# Patient Record
Sex: Female | Born: 1950
Health system: Southern US, Community
[De-identification: ages and names within clinical notes are randomized; demographics above are authoritative.]

## PROBLEM LIST (undated history)

## (undated) DIAGNOSIS — M1990 Unspecified osteoarthritis, unspecified site: Secondary | ICD-10-CM

## (undated) DIAGNOSIS — I351 Nonrheumatic aortic (valve) insufficiency: Secondary | ICD-10-CM

## (undated) DIAGNOSIS — G4730 Sleep apnea, unspecified: Secondary | ICD-10-CM

## (undated) DIAGNOSIS — R918 Other nonspecific abnormal finding of lung field: Secondary | ICD-10-CM

## (undated) DIAGNOSIS — K649 Unspecified hemorrhoids: Secondary | ICD-10-CM

## (undated) DIAGNOSIS — I340 Nonrheumatic mitral (valve) insufficiency: Secondary | ICD-10-CM

## (undated) DIAGNOSIS — Z9889 Other specified postprocedural states: Secondary | ICD-10-CM

## (undated) DIAGNOSIS — Z8489 Family history of other specified conditions: Secondary | ICD-10-CM

## (undated) DIAGNOSIS — E039 Hypothyroidism, unspecified: Secondary | ICD-10-CM

## (undated) DIAGNOSIS — E785 Hyperlipidemia, unspecified: Secondary | ICD-10-CM

## (undated) DIAGNOSIS — K219 Gastro-esophageal reflux disease without esophagitis: Secondary | ICD-10-CM

## (undated) DIAGNOSIS — T8859XA Other complications of anesthesia, initial encounter: Secondary | ICD-10-CM

## (undated) DIAGNOSIS — R112 Nausea with vomiting, unspecified: Secondary | ICD-10-CM

## (undated) DIAGNOSIS — Z8719 Personal history of other diseases of the digestive system: Secondary | ICD-10-CM

## (undated) DIAGNOSIS — I10 Essential (primary) hypertension: Secondary | ICD-10-CM

## (undated) DIAGNOSIS — I519 Heart disease, unspecified: Secondary | ICD-10-CM

## (undated) DIAGNOSIS — I714 Abdominal aortic aneurysm, without rupture: Secondary | ICD-10-CM

## (undated) DIAGNOSIS — T4145XA Adverse effect of unspecified anesthetic, initial encounter: Secondary | ICD-10-CM

## (undated) DIAGNOSIS — R51 Headache: Secondary | ICD-10-CM

## (undated) HISTORY — DX: Hyperlipidemia, unspecified: E78.5

## (undated) HISTORY — PX: VAGINAL HYSTERECTOMY: SHX2639

## (undated) HISTORY — DX: Sleep apnea, unspecified: G47.30

## (undated) HISTORY — DX: Essential (primary) hypertension: I10

---
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
## 1973-03-11 HISTORY — PX: CYSTECTOMY: SUR359

## 1978-03-11 HISTORY — PX: ANKLE SURGERY: SHX546

## 1981-03-11 HISTORY — PX: OTHER SURGICAL HISTORY: SHX169

## 1997-12-06 ENCOUNTER — Other Ambulatory Visit: Admission: RE | Admit: 1997-12-06 | Discharge: 1997-12-06 | Payer: Self-pay | Admitting: Obstetrics & Gynecology

## 1999-01-02 ENCOUNTER — Other Ambulatory Visit: Admission: RE | Admit: 1999-01-02 | Discharge: 1999-01-02 | Payer: Self-pay | Admitting: Obstetrics & Gynecology

## 1999-12-25 ENCOUNTER — Other Ambulatory Visit: Admission: RE | Admit: 1999-12-25 | Discharge: 1999-12-25 | Payer: Self-pay | Admitting: Obstetrics & Gynecology

## 1999-12-28 ENCOUNTER — Encounter: Payer: Self-pay | Admitting: Obstetrics & Gynecology

## 1999-12-28 ENCOUNTER — Ambulatory Visit (HOSPITAL_COMMUNITY): Admission: RE | Admit: 1999-12-28 | Discharge: 1999-12-28 | Payer: Self-pay | Admitting: Obstetrics & Gynecology

## 2001-01-07 ENCOUNTER — Other Ambulatory Visit: Admission: RE | Admit: 2001-01-07 | Discharge: 2001-01-07 | Payer: Self-pay | Admitting: Obstetrics & Gynecology

## 2001-12-22 ENCOUNTER — Ambulatory Visit (HOSPITAL_COMMUNITY): Admission: RE | Admit: 2001-12-22 | Discharge: 2001-12-22 | Payer: Self-pay | Admitting: Obstetrics & Gynecology

## 2001-12-22 ENCOUNTER — Encounter: Payer: Self-pay | Admitting: Obstetrics & Gynecology

## 2002-01-08 ENCOUNTER — Other Ambulatory Visit: Admission: RE | Admit: 2002-01-08 | Discharge: 2002-01-08 | Payer: Self-pay | Admitting: Obstetrics & Gynecology

## 2002-12-24 ENCOUNTER — Ambulatory Visit (HOSPITAL_COMMUNITY): Admission: RE | Admit: 2002-12-24 | Discharge: 2002-12-24 | Payer: Self-pay | Admitting: Obstetrics & Gynecology

## 2002-12-24 ENCOUNTER — Encounter: Payer: Self-pay | Admitting: Obstetrics & Gynecology

## 2003-01-12 ENCOUNTER — Other Ambulatory Visit: Admission: RE | Admit: 2003-01-12 | Discharge: 2003-01-12 | Payer: Self-pay | Admitting: Obstetrics & Gynecology

## 2003-03-12 HISTORY — PX: HEMORRHOID SURGERY: SHX153

## 2003-09-29 ENCOUNTER — Ambulatory Visit (HOSPITAL_COMMUNITY): Admission: RE | Admit: 2003-09-29 | Discharge: 2003-09-29 | Payer: Self-pay | Admitting: Gastroenterology

## 2003-10-28 ENCOUNTER — Encounter (INDEPENDENT_AMBULATORY_CARE_PROVIDER_SITE_OTHER): Payer: Self-pay | Admitting: *Deleted

## 2003-10-28 ENCOUNTER — Ambulatory Visit (HOSPITAL_COMMUNITY): Admission: RE | Admit: 2003-10-28 | Discharge: 2003-10-28 | Payer: Self-pay | Admitting: *Deleted

## 2004-01-10 ENCOUNTER — Ambulatory Visit (HOSPITAL_COMMUNITY): Admission: RE | Admit: 2004-01-10 | Discharge: 2004-01-10 | Payer: Self-pay | Admitting: Obstetrics & Gynecology

## 2004-01-26 ENCOUNTER — Other Ambulatory Visit: Admission: RE | Admit: 2004-01-26 | Discharge: 2004-01-26 | Payer: Self-pay | Admitting: Obstetrics & Gynecology

## 2004-11-21 ENCOUNTER — Emergency Department (HOSPITAL_COMMUNITY): Admission: EM | Admit: 2004-11-21 | Discharge: 2004-11-21 | Payer: Self-pay | Admitting: Emergency Medicine

## 2005-01-17 ENCOUNTER — Ambulatory Visit (HOSPITAL_COMMUNITY): Admission: RE | Admit: 2005-01-17 | Discharge: 2005-01-17 | Payer: Self-pay | Admitting: Obstetrics & Gynecology

## 2005-02-04 ENCOUNTER — Other Ambulatory Visit: Admission: RE | Admit: 2005-02-04 | Discharge: 2005-02-04 | Payer: Self-pay | Admitting: Obstetrics & Gynecology

## 2005-11-13 ENCOUNTER — Encounter: Admission: RE | Admit: 2005-11-13 | Discharge: 2005-11-13 | Payer: Self-pay | Admitting: Internal Medicine

## 2006-02-05 ENCOUNTER — Ambulatory Visit (HOSPITAL_COMMUNITY): Admission: RE | Admit: 2006-02-05 | Discharge: 2006-02-05 | Payer: Self-pay | Admitting: Obstetrics & Gynecology

## 2006-03-28 ENCOUNTER — Emergency Department (HOSPITAL_COMMUNITY): Admission: EM | Admit: 2006-03-28 | Discharge: 2006-03-28 | Payer: Self-pay | Admitting: Emergency Medicine

## 2006-04-03 ENCOUNTER — Encounter (HOSPITAL_COMMUNITY): Admission: RE | Admit: 2006-04-03 | Discharge: 2006-05-03 | Payer: Self-pay | Admitting: Neurology

## 2006-05-05 ENCOUNTER — Encounter (HOSPITAL_COMMUNITY): Admission: RE | Admit: 2006-05-05 | Discharge: 2006-06-04 | Payer: Self-pay | Admitting: Neurology

## 2007-03-26 ENCOUNTER — Ambulatory Visit (HOSPITAL_COMMUNITY): Admission: RE | Admit: 2007-03-26 | Discharge: 2007-03-26 | Payer: Self-pay | Admitting: Obstetrics & Gynecology

## 2008-05-23 ENCOUNTER — Ambulatory Visit (HOSPITAL_COMMUNITY): Admission: RE | Admit: 2008-05-23 | Discharge: 2008-05-23 | Payer: Self-pay | Admitting: Obstetrics & Gynecology

## 2009-08-29 ENCOUNTER — Ambulatory Visit (HOSPITAL_COMMUNITY): Admission: RE | Admit: 2009-08-29 | Discharge: 2009-08-29 | Payer: Self-pay | Admitting: Obstetrics & Gynecology

## 2009-10-26 ENCOUNTER — Ambulatory Visit: Payer: Self-pay | Admitting: General Practice

## 2010-07-27 NOTE — Op Note (Signed)
NAME:  Jenny Bradford, Jenny Bradford                          ACCOUNT NO.:  [id number]   MEDICAL RECORD NO.:  [id number]                   PATIENT TYPE:  AMB   LOCATION:  DAY                                  FACILITY:  [facility]   PHYSICIAN:  Vikki Ports, M.D.         DATE OF BIRTH:  January 30, 1951   DATE OF PROCEDURE:  10/28/2003  DATE OF DISCHARGE:                                 OPERATIVE REPORT   PREOPERATIVE DIAGNOSIS:  Grade III internal hemorrhoids.   POSTOPERATIVE DIAGNOSIS:  Grade III internal hemorrhoids.   PROCEDURE:  Procedure for prolapse and hemorrhoid rectopexy.   SURGEON:  Vikki Ports, M.D.   ANESTHESIA:  General.   DESCRIPTION OF PROCEDURE:  Patient was taken to the operating room and  placed in a supine position.  After adequate anesthesia was induced using an  endotracheal tube, the patient was placed in a prone jack-knife position.  Perianal and rectal prep were undertaken.  Anal dilatation was accomplished  with three fingers.  Hemorrhoidal bundles were injected with Marcaine and  Wydase, and internal and external sphincters were injected with 20 cc of  0.5% Marcaine.  A running 2-0 Prolene purse-string suture was placed 5 cm  proximal to the dentate line.  A PPH taper was then inserted through the  anus and up into the rectum.  The purse-string suture was tied down.  It was  closed and fired after verifying no involvement of the vaginal wall.  It was  removed.  The staple line was inspected.  There was no bleeding.  Gelfoam  packing was placed.  Patient tolerated the procedure well and went to the  PACU in good condition.                                               Vikki Ports, M.D.    KRH/MEDQ  D:  10/28/2003  T:  10/28/2003  Job:  [id number]

## 2010-07-27 NOTE — Op Note (Signed)
NAME:  MALILLANY, KAZLAUSKAS NO.:  [id number]   MEDICAL RECORD NO.:  [id number]                   PATIENT TYPE:  AMB   LOCATION:  ENDO                                 FACILITY:  [facility]   PHYSICIAN:  Danise Edge, M.D.                DATE OF BIRTH:  [birth year]   DATE OF PROCEDURE:  09/29/2003  DATE OF DISCHARGE:                                 OPERATIVE REPORT   PROCEDURE:  Colonoscopy.   PROCEDURE INDICATION:  Ms. Esperansa Sarabia is a 61 year old female, born  06-07-1950.  Ms. Brunelli has intermittent painless hematochezia and is  scheduled to undergo a diagnostic colonoscopy with polypectomy to prevent  colon cancer.   ENDOSCOPIST:  Danise Edge, M.D.   PREMEDICATION:  1. Versed 10 mg.  2. Demerol 70 mg.   DESCRIPTION OF PROCEDURE:  After obtaining informed consent, Ms. Penafiel was  placed in the left lateral decubitus position.  I administered intravenous  Demerol and intravenous Versed to achieve conscious sedation for the  procedure.  The patient's blood pressure, oxygen saturation, and cardiac  rhythm were monitored throughout the procedure and documented in the medical  record.   Anal inspection was normal.  Digital rectal exam was normal.  The Olympus  adjustable pediatric colonoscope was introduced into the rectum and advanced  to the cecum.  A normal-appearing ileocecal valve was intubated and the  distal ileum inspected.  Colonic preparation for the exam today was  satisfactory after using Visicol colonic lavage tablets.   RECTUM:  Normal.  Retroflexed view of the distal rectum was normal.  I do  not identify large internal hemorrhoids.  There is an anal papilla visible  in the distal rectum by retroflexed view.  SIGMOID COLON AND DESCENDING COLON:  A few small, nonbleeding diverticula  are present in the left colon.  SPLENIC FLEXURE:  Normal.  TRANSVERSE COLON:  Normal.  HEPATIC FLEXURE:  Normal.  ASCENDING COLON:  Normal.  CECUM  AND ILEOCECAL VALVE:  Normal.  DISTAL ILEUM:  Normal.   ASSESSMENT:  Normal proctocolonoscopy to the cecum with inspection of the  distal ileum.  I do not identify large internal hemorrhoids.  There are a  few small diverticula in the left colon.                                               Danise Edge, M.D.    MJ/MEDQ  D:  09/29/2003  T:  09/29/2003  Job:  [id number]   cc:   Royetta Crochet  [address]  [place], [place] [address]   Vikki Ports, M.D.  [address]  [place]  [place] [address]  Fax: [phone]   Ike Bene, M.D.  [address]  [place]  [place] [address]  Fax: [phone]

## 2010-10-16 ENCOUNTER — Other Ambulatory Visit (HOSPITAL_COMMUNITY): Payer: Self-pay | Admitting: Obstetrics & Gynecology

## 2010-10-16 DIAGNOSIS — Z1231 Encounter for screening mammogram for malignant neoplasm of breast: Secondary | ICD-10-CM

## 2010-10-30 ENCOUNTER — Ambulatory Visit (HOSPITAL_COMMUNITY)
Admission: RE | Admit: 2010-10-30 | Discharge: 2010-10-30 | Disposition: A | Discharge status: Home / Self Care | Payer: PRIVATE HEALTH INSURANCE | Source: Ambulatory Visit | Attending: Obstetrics & Gynecology | Admitting: Obstetrics & Gynecology

## 2010-10-30 DIAGNOSIS — Z1231 Encounter for screening mammogram for malignant neoplasm of breast: Secondary | ICD-10-CM | POA: Insufficient documentation

## 2011-04-01 DIAGNOSIS — I10 Essential (primary) hypertension: Secondary | ICD-10-CM

## 2011-04-01 HISTORY — DX: Essential (primary) hypertension: I10

## 2011-10-28 ENCOUNTER — Other Ambulatory Visit: Payer: Self-pay | Admitting: Obstetrics & Gynecology

## 2011-12-10 HISTORY — PX: FOOT SURGERY: SHX648

## 2012-01-23 ENCOUNTER — Other Ambulatory Visit (HOSPITAL_COMMUNITY): Payer: Self-pay | Admitting: Obstetrics & Gynecology

## 2012-01-23 DIAGNOSIS — Z1231 Encounter for screening mammogram for malignant neoplasm of breast: Secondary | ICD-10-CM

## 2012-02-10 ENCOUNTER — Ambulatory Visit (HOSPITAL_COMMUNITY)
Admission: RE | Admit: 2012-02-10 | Discharge: 2012-02-10 | Disposition: A | Discharge status: Home / Self Care | Payer: PRIVATE HEALTH INSURANCE | Source: Ambulatory Visit | Attending: Obstetrics & Gynecology | Admitting: Obstetrics & Gynecology

## 2012-02-10 DIAGNOSIS — Z1231 Encounter for screening mammogram for malignant neoplasm of breast: Secondary | ICD-10-CM | POA: Insufficient documentation

## 2012-04-16 ENCOUNTER — Other Ambulatory Visit (HOSPITAL_COMMUNITY): Payer: Self-pay | Admitting: Cardiovascular Disease

## 2012-04-16 DIAGNOSIS — I119 Hypertensive heart disease without heart failure: Secondary | ICD-10-CM

## 2012-05-01 ENCOUNTER — Ambulatory Visit (HOSPITAL_COMMUNITY)
Admission: RE | Admit: 2012-05-01 | Discharge: 2012-05-01 | Disposition: A | Discharge status: Home / Self Care | Payer: 59 | Source: Ambulatory Visit | Attending: Cardiovascular Disease | Admitting: Cardiovascular Disease

## 2012-05-01 DIAGNOSIS — I119 Hypertensive heart disease without heart failure: Secondary | ICD-10-CM

## 2012-05-01 DIAGNOSIS — R002 Palpitations: Secondary | ICD-10-CM | POA: Insufficient documentation

## 2012-05-01 DIAGNOSIS — R0609 Other forms of dyspnea: Secondary | ICD-10-CM | POA: Insufficient documentation

## 2012-05-01 DIAGNOSIS — I10 Essential (primary) hypertension: Secondary | ICD-10-CM | POA: Insufficient documentation

## 2012-05-01 DIAGNOSIS — R0989 Other specified symptoms and signs involving the circulatory and respiratory systems: Secondary | ICD-10-CM | POA: Insufficient documentation

## 2012-05-01 DIAGNOSIS — R079 Chest pain, unspecified: Secondary | ICD-10-CM | POA: Insufficient documentation

## 2012-05-01 MED ORDER — TECHNETIUM TC 99M SESTAMIBI GENERIC - CARDIOLITE
30.0000 | Freq: Once | INTRAVENOUS | Status: AC | PRN
  Administered 2012-05-01: 30 via INTRAVENOUS

## 2012-05-01 MED ORDER — TECHNETIUM TC 99M SESTAMIBI GENERIC - CARDIOLITE
10.0000 | Freq: Once | INTRAVENOUS | Status: AC | PRN
  Administered 2012-05-01: 10 via INTRAVENOUS

## 2012-05-01 MED ORDER — REGADENOSON 0.4 MG/5ML IV SOLN
0.4000 mg | Freq: Once | INTRAVENOUS | Status: AC
  Administered 2012-05-01: 0.4 mg via INTRAVENOUS

## 2012-05-01 NOTE — Procedures (Addendum)
[facility] [address] [place] [address] [place] [address] [phone]  Cardiology Nuclear Med Study  Jenny Bradford is a 62 y.o. female     MRN : [id number]     DOB: November 05, 1950  Procedure Date: 05/01/2012  Nuclear Med Background Indication for Stress Test:  Evaluation for Ischemia History:  NO PRIOR CARDIAC HISTORY  Cardiac Risk Factors: Family History - CAD, Hypertension and Lipids  Symptoms:  Chest Pain, DOE, Fatigue, Palpitations and SOB   Nuclear Pre-Procedure Caffeine/Decaff Intake:  1:30am NPO After: 11:30 AM   IV Site: R Forearm  IV 0.9% NS with Angio Cath:  22g  Chest Size (in):  N/A IV Started by: Emmit Pomfret, RN  Height: 5\' 3"  (1.6 m)  Cup Size: C  BMI:  Body mass index is 28.35 kg/(m^2). Weight:  160 lb (72.576 kg)   Tech Comments:  N/A    Nuclear Med Study 1 or 2 day study: 1 day  Stress Test Type:  Lexiscan  Order Authorizing Provider:  Nicki Guadalajara, MD   Resting Radionuclide: Technetium 11m Sestamibi  Resting Radionuclide Dose: 10.6 mCi   Stress Radionuclide:  Technetium 76m Sestamibi  Stress Radionuclide Dose: 30.7 mCi           Stress Protocol Rest HR: 60 Stress HR: 84  Rest BP:145/89 Stress BP: 125/79  Exercise Time (min): n/a METS: n/a          Dose of Adenosine (mg):  n/a Dose of Lexiscan: 0.4 mg  Dose of Atropine (mg): n/a Dose of Dobutamine: n/a mcg/kg/min (at max HR)  Stress Test Technologist: Ernestene Mention, CCT Nuclear Technologist: Koren Shiver, CNMT   Rest Procedure:  Myocardial perfusion imaging was performed at rest 45 minutes following the intravenous administration of Technetium 7m Sestamibi. Stress Procedure:  The patient received IV Lexiscan 0.4 mg over 15-seconds.  Technetium 57m Sestamibi injected at 30-seconds.  There were no significant changes with Lexiscan.  Quantitative spect images were obtained after a 45 minute delay.  Transient Ischemic Dilatation (Normal <1.22):   0.62 Lung/Heart Ratio (Normal <0.45):  0.34 QGS EDV:  50  ml QGS ESV:  11 ml LV Ejection Fraction: 78%     Rest ECG: NSR - Normal EKG and RSR'  Stress ECG: No significant change from baseline ECG  QPS  Raw Data Images: There is no interference from nuclear activity from structures below the diaphragm. Mild breast attenuation. Small left ventricular size.  Stress Images: There is mild apical thinning with normal uptake in other regions.  Rest Images: Comparison with the stress images reveals no significant change.  Subtraction (SDS): No reversibility is appreciated. There is no evidence of scar or ischemia.  Impression  Exercise Capacity: Lexiscan with no exercise.  BP Response: Normal blood pressure response.  Clinical Symptoms: No significant symptoms noted.  ECG Impression: No significant ST segment change with adenosine.  Comparison with Prior Nuclear Study: No significant change from previous study  Overall Impression: Normal stress nuclear study. Low risk stress nuclear study.  LV Wall Motion: NL LV Function; NL Wall Motion  Thurmon Fair, MD, [facility] [phone] office [phone] pager

## 2012-05-03 NOTE — Procedures (Signed)
   Preliminary Report:    Rest ECG: NSR - Normal EKG and RSR'  Stress ECG: No significant change from baseline ECG  QPS Raw Data Images:  There is no interference from nuclear activity from structures below the diaphragm.    Mild breast attenuation.  Small left ventricular size. Stress Images:  There is mild apical thinning with normal uptake in other regions. Rest Images:  Comparison with the stress images reveals no significant change. Subtraction (SDS):  No reversibility is appreciated.  There is no evidence of scar or ischemia.  Impression Exercise Capacity:  Lexiscan with no exercise. BP Response:  Normal blood pressure response. Clinical Symptoms:  No significant symptoms noted. ECG Impression:  No significant ST segment change with adenosine. Comparison with Prior Nuclear Study: No significant change from previous study  Overall Impression:  Normal stress nuclear study. Low risk stress nuclear study.  LV Wall Motion:  NL LV Function; NL Wall Motion   Jenny Bradford W, MD  05/03/2012 7:37 PM

## 2012-05-03 NOTE — CV Procedure (Addendum)
Preliminary Report: Lexiscan Myoview Rest ECG: NSR - Normal EKG and RSR'  Stress ECG: No significant change from baseline ECG  QPS   Raw Data Images: There is no interference from nuclear activity from structures below the diaphragm. Mild breast attenuation. Small left ventricular size.  Stress Images: There is mild apical thinning with normal uptake in other regions.  Rest Images: Comparison with the stress images reveals no significant change.  Subtraction (SDS): No reversibility is appreciated. There is no evidence of scar or ischemia.  Impression   Exercise Capacity: Lexiscan with no exercise.  BP Response: Normal blood pressure response.  Clinical Symptoms: No significant symptoms noted.  ECG Impression: No significant ST segment change with adenosine.  LV Wall Motion: NL LV Function; NL Wall Motion  Comparison with Prior Nuclear Study: No significant change from previous study   Overall Impression: Normal stress nuclear study. Low risk stress nuclear study.    Marykay Lex, MD  05/03/2012 7:37 PM

## 2012-08-06 ENCOUNTER — Ambulatory Visit: Payer: PRIVATE HEALTH INSURANCE | Admitting: Cardiovascular Disease

## 2012-08-06 ENCOUNTER — Encounter: Payer: Self-pay | Admitting: *Deleted

## 2012-08-18 ENCOUNTER — Encounter: Payer: Self-pay | Admitting: Cardiovascular Disease

## 2012-10-05 ENCOUNTER — Ambulatory Visit (INDEPENDENT_AMBULATORY_CARE_PROVIDER_SITE_OTHER): Payer: 59 | Admitting: Cardiovascular Disease

## 2012-10-05 ENCOUNTER — Encounter: Payer: Self-pay | Admitting: Cardiovascular Disease

## 2012-10-05 VITALS — BP 140/88 | HR 78 | Ht 63.0 in | Wt 159.0 lb

## 2012-10-05 DIAGNOSIS — R609 Edema, unspecified: Secondary | ICD-10-CM

## 2012-10-05 DIAGNOSIS — G4733 Obstructive sleep apnea (adult) (pediatric): Secondary | ICD-10-CM

## 2012-10-05 DIAGNOSIS — Z9989 Dependence on other enabling machines and devices: Secondary | ICD-10-CM

## 2012-10-05 DIAGNOSIS — G43909 Migraine, unspecified, not intractable, without status migrainosus: Secondary | ICD-10-CM

## 2012-10-05 DIAGNOSIS — I10 Essential (primary) hypertension: Secondary | ICD-10-CM

## 2012-10-05 DIAGNOSIS — E785 Hyperlipidemia, unspecified: Secondary | ICD-10-CM | POA: Insufficient documentation

## 2012-10-05 NOTE — Progress Notes (Signed)
Patient ID: Jenny Bradford, female   DOB: May 17, 1950, 62 y.o.   MRN: [id number]   HPI: Jenny Bradford, is a 62 y.o. female who presents to the office today for sleep clinic evaluation following initiation of CPAP therapy.  Jenny Bradford has a history of hypertension, and mild hyperlipidemia. He had experienced episodes of left-sided chest discomfort and had a normal myocardial perfusion study. She did have a history suggestive of obstructive sleep apnea and underwent a diagnostic polysomnogram on 01/29/2013. This revealed mild/moderate sleep apnea with an AHI of approximately 14 per hour to RDI was 16.5 to she dropped her oxygen to 81% with REM sleep. She had evidence for loud snoring. She subsequently underwent CPAP titration on 05/29/2012. Titration was suboptimal due to marked nasal congestion contributing contributing to his poor sleep efficiency. She also had a significant positional component and consequently a CPAP auto unit was recommended with a CPAP minimum of 9 ranging up to 20 cm with heated humidification. Since initiating CPAP therapy, she has felt somewhat better. However, she does admit to suboptimal compliance. Often times she falls asleep in a recliner ears and does not go to bed until much later when she wakes up. She does not use his CPAP when she is in a recliner. A download was obtained from 09/05/2012 through 10/04/2012. This showed reduced compliance such that usage states was only 53%. Days greater than 4 hours of use was only 40%. Her 95th percentile pressure was 11.3 with a maximum of 12.1. AHI however was excellent at 1.6/hr. In addition, Jenny Bradford states  recently sustained a stress fracture to the bones in her left foot. She's wearing a hard boot today. She has noticed more swelling. She also notes depending for. She recently saw her podiatrist and he was concerned potentially about her lower external circulation.   Epworth Sleepiness Scale: Situation   Chance of Dozing/Sleeping  (0 = never , 1 = slight chance , 2 = moderate chance , 3 = high chance )   sitting and reading 1   watching TV 2   sitting inactive in a public place 0   being a passenger in a motor vehicle for an hour or more 0   lying down in the afternoon 2   sitting and talking to someone 0   sitting quietly after lunch (no alcohol) 0   while stopped for a few minutes in traffic as the driver 0   Total Score  5    Past Medical History  Diagnosis Date  . Heart murmur   . Sleep apnea 05/01/12 & 05/29/12    SLEEP STUDY-[facility].  Marland Kitchen Hypertension 04/01/11    ECHO-EF>55% NUC STRESS TEST- 05/01/12  . Hyperlipidemia     Past Surgical History  Procedure Laterality Date  . Miscaraige  1983    Allergies  Allergen Reactions  . Darvon (Propoxyphene Hcl) Other (See Comments)    DIZZINESS  . Marcaine (Bupivacaine Hcl) Other (See Comments)    SYNCOPE   . Midol (Aspirin-Cinnamedrine-Caffeine) Other (See Comments)    DIZZINESS  . Talwin (Pentazocine) Other (See Comments)    EXTREME DROWSINESS  . Naproxen Rash    Current Outpatient Prescriptions  Medication Sig Dispense Refill  . acetaminophen (TYLENOL) 500 MG tablet Take 500 mg by mouth 2 (two) times daily.      Marland Kitchen ALPRAZolam (XANAX) 0.25 MG tablet Take 0.25 mg by mouth as needed for sleep.      . butalbital-acetaminophen-caffeine (FIORICET,  ESGIC) 50-325-40 MG per tablet Take 1 tablet by mouth 2 (two) times daily as needed for headache.      . calcium carbonate (TUMS) 500 MG chewable tablet Chew 1 tablet by mouth daily.      . celecoxib (CELEBREX) 100 MG capsule Take 100 mg by mouth as needed for pain.      . cetirizine (ZYRTEC) 10 MG tablet Take 10 mg by mouth as needed for allergies.      Marland Kitchen escitalopram (LEXAPRO) 20 MG tablet Take 20 mg by mouth daily.      . Glucosamine-Chondroit-Vit C-Mn (GLUCOSAMINE CHONDR 1500 COMPLX PO) Take 1,500 mg by mouth daily. 2 TABLETS.      . hydrochlorothiazide (HYDRODIURIL) 25 MG tablet  Take 25 mg by mouth daily.      Marland Kitchen HYDROcodone-acetaminophen (NORCO/VICODIN) 5-325 MG per tablet Take 1 tablet by mouth every 6 (six) hours as needed for pain.      Marland Kitchen losartan (COZAAR) 50 MG tablet Take 50 mg by mouth daily.      . pantoprazole (PROTONIX) 40 MG tablet Take 40 mg by mouth daily.      . pravastatin (PRAVACHOL) 40 MG tablet Take 40 mg by mouth daily.      . pseudoephedrine (SUDAFED) 30 MG tablet Take 30 mg by mouth as needed for congestion.      . SUMAtriptan (IMITREX) 50 MG tablet Take 50 mg by mouth as needed for migraine.       No current facility-administered medications for this visit.    Social history is notable in that she is married for 33 years. She has 2 children. She is a Teacher, early years/pre at [employer]. There is no tobacco or alcohol use the  ROS negative for fever, chills or night sweats. She easily sustained a stress fracture to her left leg metatarsal region. She has noticed dependent rubor. She does note swelling bilaterally. She denies chest pain. She denies nocturnal tachycardia palpitations. When she uses CPAP she is unaware of breakthrough snoring when she uses the CPAP she is more alert. However, she not been using it all night and she states a lot of times this may be due to the fact that she sleeps in her recliner for portion of the night. She denies bruxism. She denies abdominal pain or bleeding. She is unaware of restless legs. There is a history of plantar fasciitis. Other system review is negative.  PE BP 140/88  Pulse 78  Ht 5\' 3"  (1.6 m)  Wt 159 lb (72.122 kg)  BMI 28.17 kg/m2  General: Alert, oriented, no distress.  Skin: normal turgor, no rashes HEENT: Normocephalic, atraumatic. Pupils round and reactive; sclera anicteric;  Nose without nasal septal hypertrophy Mouth/Parynx benign; Mallinpatti scale 3 Neck: No JVD, no carotid briuts Lungs: clear to ausculatation and percussion; no wheezing or rales Heart: RRR, s1 s2 normal 1/6 systolic  murmur Abdomen: soft, nontender; no hepatosplenomehaly, BS+; abdominal aorta nontender and not dilated by palpation. Pulses 2+ Extremities: Trace edema bilaterally lower extremities. no clubbinbg cyanosis, Homan's sign negative  Neurologic: grossly nonfocal    LABS:  BMET No results found for this basename: na, k, cl, co2, glucose, bun, creatinine, calcium, gfrnonaa, gfraa     Hepatic Function Panel  No results found for this basename: prot, albumin, ast, alt, alkphos, bilitot, bilidir, ibili     CBC No results found for this basename: wbc, rbc, hgb, hct, plt, mcv, mch, mchc, rdw, neutrabs, lymphsabs, monoabs, eosabs, basosabs  BNP No results found for this basename: probnp    Lipid Panel  No results found for this basename: chol, trig, hdl, cholhdl, vldl, ldlcalc     RADIOLOGY: No results found.    ASSESSMENT AND PLAN: Ms. Hartfield is now 62 years old. She does have mild/moderate sleep apnea. I had extensive discussion with her reviewed her download as well as sleep studies in detail. When she uses her CPAP therapy her sleep apnea is significantly improved. However, we did talk that she is not compliant with reference to usage discussed the importance of 100% usage. We also discussed good sleep hygiene and in particular go to bed at night rather than to fall sleep in a recliner for several hours and then returning to bed. She was also concerned about her lower from the circulation. I suspect she most likely is having dependent rubor which may be contributed also by impairment in venous return which may be contributed by her hard boot. I'm scheduling her for LE arterial Doppler as well as venous insufficiency studies.  She is using a nasal mask and seemed to tolerate this better than the previous nasal pillows. She also has problems with plantar fasciitis followed by Dr. Sharen Heck. I will see her back in 3 months for  evaluation and prior to that office visit a download will be  obtained.     Lennette Bihari, MD, [facility]  10/05/2012 3:26 PM

## 2012-10-05 NOTE — Patient Instructions (Signed)
Your physician has requested that you have a lower or upper extremity venous duplex. This test is an ultrasound of the veins in the legs or arms. It looks at venous blood flow that carries blood from the heart to the legs or arms. Allow one hour for a Lower Venous exam. Allow thirty minutes for an Upper Venous exam. There are no restrictions or special instructions.  Your physician recommends that you schedule a follow-up appointment in: 3 months.

## 2012-10-23 ENCOUNTER — Ambulatory Visit (HOSPITAL_COMMUNITY)
Admission: RE | Admit: 2012-10-23 | Discharge: 2012-10-23 | Disposition: A | Discharge status: Home / Self Care | Payer: 59 | Source: Ambulatory Visit | Attending: Internal Medicine | Admitting: Internal Medicine

## 2012-10-23 DIAGNOSIS — R609 Edema, unspecified: Secondary | ICD-10-CM | POA: Insufficient documentation

## 2012-10-23 DIAGNOSIS — M79609 Pain in unspecified limb: Secondary | ICD-10-CM

## 2012-10-23 DIAGNOSIS — M7989 Other specified soft tissue disorders: Secondary | ICD-10-CM

## 2012-10-23 NOTE — Progress Notes (Signed)
Lower Extremity Venous Duplex Completed. °Jenny Bradford ° °

## 2012-10-23 NOTE — Progress Notes (Signed)
Arterial Lower Ext. Duplex Completed.  Preliminary by tech - Normal study. Marilynne Halsted, BS, RDMS, RVT

## 2012-11-03 ENCOUNTER — Other Ambulatory Visit: Payer: Self-pay | Admitting: Obstetrics & Gynecology

## 2012-11-06 ENCOUNTER — Encounter: Payer: Self-pay | Admitting: *Deleted

## 2012-11-06 NOTE — Progress Notes (Signed)
Quick Note:  Note sent to patient ______ 

## 2012-12-01 HISTORY — PX: FOOT SURGERY: SHX648

## 2012-12-18 ENCOUNTER — Encounter: Payer: Self-pay | Admitting: *Deleted

## 2012-12-21 ENCOUNTER — Ambulatory Visit: Payer: Self-pay

## 2012-12-22 ENCOUNTER — Ambulatory Visit (INDEPENDENT_AMBULATORY_CARE_PROVIDER_SITE_OTHER): Payer: 59 | Admitting: Podiatry

## 2012-12-22 ENCOUNTER — Encounter: Payer: Self-pay | Admitting: Podiatry

## 2012-12-22 VITALS — BP 142/67 | HR 67 | Resp 16 | Ht 63.0 in | Wt 158.0 lb

## 2012-12-22 DIAGNOSIS — M722 Plantar fascial fibromatosis: Secondary | ICD-10-CM

## 2012-12-22 NOTE — Patient Instructions (Signed)
Begin tennis shoes slowly

## 2012-12-23 NOTE — Progress Notes (Signed)
Subjective:     Patient ID: Lenice Pressman, female   DOB: 1950/09/29, 63 y.o.   MRN: [id number]  HPI patient states I'm doing well. She is wearing a tennis shoe and is able to walk with mild discomfort. 3 weeks postop endoscopic release fascially band right   Review of Systems  All other systems reviewed and are negative.       Objective:   Physical Exam  Nursing note and vitals reviewed. Cardiovascular: Intact distal pulses.   Musculoskeletal: Normal range of motion.  Neurological: She is alert.  Skin: Skin is warm.   Right plantar heel is healing well with stitches intact. Negative Homans sign noted    Assessment:     Healing well from endoscopic release medial band right heel    Plan:     Stitches removed wound edges coapted well. Dispensed anklet and instructed on compression and gradual increase in activity reappoint 6 weeks

## 2013-01-29 ENCOUNTER — Ambulatory Visit: Payer: 59 | Admitting: Cardiovascular Disease

## 2013-02-12 ENCOUNTER — Encounter: Payer: Self-pay | Admitting: Podiatry

## 2013-02-12 ENCOUNTER — Ambulatory Visit (INDEPENDENT_AMBULATORY_CARE_PROVIDER_SITE_OTHER): Payer: 59 | Admitting: Podiatry

## 2013-02-12 ENCOUNTER — Ambulatory Visit (INDEPENDENT_AMBULATORY_CARE_PROVIDER_SITE_OTHER): Payer: 59

## 2013-02-12 VITALS — BP 148/91 | HR 64 | Resp 16

## 2013-02-12 DIAGNOSIS — S92902S Unspecified fracture of left foot, sequela: Secondary | ICD-10-CM

## 2013-02-12 DIAGNOSIS — M8448XA Pathological fracture, other site, initial encounter for fracture: Secondary | ICD-10-CM

## 2013-02-12 DIAGNOSIS — S8290XS Unspecified fracture of unspecified lower leg, sequela: Secondary | ICD-10-CM

## 2013-02-12 DIAGNOSIS — M722 Plantar fascial fibromatosis: Secondary | ICD-10-CM

## 2013-02-12 DIAGNOSIS — R609 Edema, unspecified: Secondary | ICD-10-CM

## 2013-02-12 NOTE — Patient Instructions (Signed)
Take wraps off in 3-4 days

## 2013-02-13 NOTE — Progress Notes (Signed)
Subjective:     Patient ID: Jenny Bradford, female   DOB: [birth year], 62 y.o.   MRN: [id number]  HPI patient states my left foot started showing a in the last few days as I did on my foot more with my father and also my right arch has been sore   Review of Systems     Objective:   Physical Exam Neurovascular status intact with no health history changes noted. Patient has pain that is intense in the second metatarsal shaft distal to previous and pain in the right arch noted    Assessment:     Possible distal stress fracture left foot and fasciitis right arch with the heal but we did surgery on doing well    Plan:     Reviewed x-rays and applied Unna boot left to try to reduce swelling along with return to boot at home. Applied fascially strapping right to support arch and reappoint in 3 weeks

## 2013-03-09 ENCOUNTER — Encounter: Payer: Self-pay | Admitting: Podiatry

## 2013-03-09 ENCOUNTER — Ambulatory Visit (INDEPENDENT_AMBULATORY_CARE_PROVIDER_SITE_OTHER): Payer: 59

## 2013-03-09 ENCOUNTER — Ambulatory Visit (INDEPENDENT_AMBULATORY_CARE_PROVIDER_SITE_OTHER): Payer: 59 | Admitting: Podiatry

## 2013-03-09 VITALS — BP 141/81 | HR 63 | Resp 16 | Ht 63.0 in | Wt 160.0 lb

## 2013-03-09 DIAGNOSIS — M79609 Pain in unspecified limb: Secondary | ICD-10-CM

## 2013-03-09 DIAGNOSIS — M79672 Pain in left foot: Secondary | ICD-10-CM

## 2013-03-09 DIAGNOSIS — M7750 Other enthesopathy of unspecified foot: Secondary | ICD-10-CM

## 2013-03-09 DIAGNOSIS — M8448XA Pathological fracture, other site, initial encounter for fracture: Secondary | ICD-10-CM

## 2013-03-09 MED ORDER — HYDROCODONE-ACETAMINOPHEN 10-325 MG PO TABS: 1.0000 | ORAL_TABLET | Freq: Three times a day (TID) | ORAL | Status: DC | PRN

## 2013-03-09 MED ORDER — TRIAMCINOLONE ACETONIDE 10 MG/ML IJ SUSP: 10.0000 mg | Freq: Once | INTRAMUSCULAR | Status: DC

## 2013-03-09 MED ORDER — TRIAMCINOLONE ACETONIDE 10 MG/ML IJ SUSP
10.0000 mg | Freq: Once | INTRAMUSCULAR | Status: AC
  Administered 2013-03-09: 10 mg

## 2013-03-10 NOTE — Progress Notes (Signed)
Subjective:     Patient ID: Jenny Bradford, female   DOB: 1950-09-03, 62 y.o.   MRN: [id number]  HPI patient states that I am still getting pain on top of my left foot even though it seems closer to right ankle and arch pain on my right and I don't feel like that has completely healed   Review of Systems     Objective:   Physical Exam Neurovascular status intact with continued edema in the feet but I do feel that has reduced from previous visit   I explained to her that this may be compensation within the ankle joint do to the discomfort she had in the metatarsals from previously and at the right foot is probably overuse secondary to wearing the boot on the left  Assessment:     Probable tendinitis of the left dorsal ankle foot and sinus tarsitis left along with overuse right    Plan:     Reviewed condition and discussed. Today I did a careful sinus tarsi injection left and around the ankle joint to reduce the inflammation and advised on increase in activity and reduction of the boot. I am hopeful we are over this. I also reviewed x-ray of the left foot

## 2013-05-21 ENCOUNTER — Other Ambulatory Visit (HOSPITAL_COMMUNITY): Payer: Self-pay | Admitting: Orthopaedic Surgery

## 2013-05-21 DIAGNOSIS — M79673 Pain in unspecified foot: Secondary | ICD-10-CM

## 2013-05-26 ENCOUNTER — Ambulatory Visit (HOSPITAL_COMMUNITY)
Admission: RE | Admit: 2013-05-26 | Discharge: 2013-05-26 | Disposition: A | Discharge status: Home / Self Care | Payer: 59 | Source: Ambulatory Visit | Attending: Orthopaedic Surgery | Admitting: Orthopaedic Surgery

## 2013-05-26 DIAGNOSIS — M7989 Other specified soft tissue disorders: Secondary | ICD-10-CM | POA: Insufficient documentation

## 2013-05-26 DIAGNOSIS — M79609 Pain in unspecified limb: Secondary | ICD-10-CM | POA: Insufficient documentation

## 2013-05-26 DIAGNOSIS — M79673 Pain in unspecified foot: Secondary | ICD-10-CM

## 2013-05-26 DIAGNOSIS — Z4789 Encounter for other orthopedic aftercare: Secondary | ICD-10-CM | POA: Insufficient documentation

## 2013-06-10 ENCOUNTER — Encounter: Payer: Self-pay | Admitting: Orthopaedic Surgery

## 2013-06-17 ENCOUNTER — Other Ambulatory Visit (HOSPITAL_COMMUNITY): Payer: Self-pay | Admitting: Obstetrics & Gynecology

## 2013-06-17 DIAGNOSIS — N951 Menopausal and female climacteric states: Secondary | ICD-10-CM

## 2013-06-21 ENCOUNTER — Other Ambulatory Visit (HOSPITAL_COMMUNITY): Payer: Self-pay | Admitting: Obstetrics & Gynecology

## 2013-06-21 DIAGNOSIS — Z1231 Encounter for screening mammogram for malignant neoplasm of breast: Secondary | ICD-10-CM

## 2013-07-05 ENCOUNTER — Ambulatory Visit (HOSPITAL_COMMUNITY): Payer: 59

## 2013-07-05 ENCOUNTER — Ambulatory Visit (HOSPITAL_COMMUNITY)
Admission: RE | Admit: 2013-07-05 | Discharge: 2013-07-05 | Disposition: A | Discharge status: Home / Self Care | Payer: 59 | Source: Ambulatory Visit | Attending: Obstetrics & Gynecology | Admitting: Obstetrics & Gynecology

## 2013-07-05 ENCOUNTER — Other Ambulatory Visit (HOSPITAL_COMMUNITY): Payer: Self-pay | Admitting: Obstetrics & Gynecology

## 2013-07-05 ENCOUNTER — Other Ambulatory Visit (HOSPITAL_COMMUNITY): Payer: 59

## 2013-07-05 DIAGNOSIS — Z1231 Encounter for screening mammogram for malignant neoplasm of breast: Secondary | ICD-10-CM

## 2013-07-05 DIAGNOSIS — N951 Menopausal and female climacteric states: Secondary | ICD-10-CM

## 2013-07-09 ENCOUNTER — Encounter: Payer: Self-pay | Admitting: Orthopaedic Surgery

## 2013-08-09 ENCOUNTER — Other Ambulatory Visit: Payer: Self-pay | Admitting: Gastroenterology

## 2013-11-30 ENCOUNTER — Encounter (HOSPITAL_COMMUNITY): Payer: Self-pay | Admitting: *Deleted

## 2013-11-30 ENCOUNTER — Other Ambulatory Visit: Payer: Self-pay | Admitting: Gastroenterology

## 2013-11-30 ENCOUNTER — Other Ambulatory Visit: Payer: Self-pay | Admitting: Obstetrics & Gynecology

## 2013-12-01 LAB — CYTOLOGY - PAP

## 2013-12-07 ENCOUNTER — Encounter (HOSPITAL_COMMUNITY): Payer: 59 | Admitting: Anesthesiology

## 2013-12-07 ENCOUNTER — Ambulatory Visit (HOSPITAL_COMMUNITY): Payer: 59 | Admitting: Anesthesiology

## 2013-12-07 ENCOUNTER — Encounter (HOSPITAL_COMMUNITY)
Admission: RE | Disposition: A | Discharge status: Home / Self Care | Payer: Self-pay | Source: Ambulatory Visit | Attending: Gastroenterology

## 2013-12-07 ENCOUNTER — Encounter (HOSPITAL_COMMUNITY): Payer: Self-pay | Admitting: *Deleted

## 2013-12-07 ENCOUNTER — Ambulatory Visit (HOSPITAL_COMMUNITY)
Admission: RE | Admit: 2013-12-07 | Discharge: 2013-12-07 | Disposition: A | Discharge status: Home / Self Care | Payer: 59 | Source: Ambulatory Visit | Attending: Gastroenterology | Admitting: Gastroenterology

## 2013-12-07 DIAGNOSIS — E7800 Pure hypercholesterolemia, unspecified: Secondary | ICD-10-CM | POA: Insufficient documentation

## 2013-12-07 DIAGNOSIS — Z1211 Encounter for screening for malignant neoplasm of colon: Secondary | ICD-10-CM | POA: Insufficient documentation

## 2013-12-07 DIAGNOSIS — G43109 Migraine with aura, not intractable, without status migrainosus: Secondary | ICD-10-CM | POA: Diagnosis not present

## 2013-12-07 DIAGNOSIS — I10 Essential (primary) hypertension: Secondary | ICD-10-CM | POA: Insufficient documentation

## 2013-12-07 DIAGNOSIS — Z79899 Other long term (current) drug therapy: Secondary | ICD-10-CM | POA: Insufficient documentation

## 2013-12-07 DIAGNOSIS — K5730 Diverticulosis of large intestine without perforation or abscess without bleeding: Secondary | ICD-10-CM | POA: Diagnosis not present

## 2013-12-07 HISTORY — DX: Gastro-esophageal reflux disease without esophagitis: K21.9

## 2013-12-07 HISTORY — DX: Family history of other specified conditions: Z84.89

## 2013-12-07 HISTORY — DX: Adverse effect of unspecified anesthetic, initial encounter: T41.45XA

## 2013-12-07 HISTORY — DX: Unspecified osteoarthritis, unspecified site: M19.90

## 2013-12-07 HISTORY — DX: Headache: R51

## 2013-12-07 HISTORY — DX: Other specified postprocedural states: Z98.890

## 2013-12-07 HISTORY — PX: COLONOSCOPY WITH PROPOFOL: SHX5780

## 2013-12-07 HISTORY — DX: Other complications of anesthesia, initial encounter: T88.59XA

## 2013-12-07 HISTORY — DX: Nausea with vomiting, unspecified: R11.2

## 2013-12-07 HISTORY — DX: Personal history of other diseases of the digestive system: Z87.19

## 2013-12-07 SURGERY — COLONOSCOPY WITH PROPOFOL
Anesthesia: Monitor Anesthesia Care

## 2013-12-07 MED ORDER — PROPOFOL INFUSION 10 MG/ML OPTIME
INTRAVENOUS | Status: DC | PRN
  Administered 2013-12-07: 140 ug/kg/min via INTRAVENOUS

## 2013-12-07 MED ORDER — LIDOCAINE HCL (CARDIAC) 20 MG/ML IV SOLN
INTRAVENOUS | Status: AC
  Filled 2013-12-07: qty 5

## 2013-12-07 MED ORDER — PROPOFOL 10 MG/ML IV BOLUS
INTRAVENOUS | Status: AC
  Filled 2013-12-07: qty 20

## 2013-12-07 MED ORDER — LIDOCAINE HCL 1 % IJ SOLN
INTRAMUSCULAR | Status: DC | PRN
  Administered 2013-12-07: 40 mg via INTRADERMAL

## 2013-12-07 MED ORDER — SODIUM CHLORIDE 0.9 % IV SOLN: INTRAVENOUS | Status: DC

## 2013-12-07 MED ORDER — MIDAZOLAM HCL 5 MG/5ML IJ SOLN
INTRAMUSCULAR | Status: DC | PRN
  Administered 2013-12-07 (×2): 1 mg via INTRAVENOUS

## 2013-12-07 MED ORDER — MIDAZOLAM HCL 2 MG/2ML IJ SOLN
INTRAMUSCULAR | Status: AC
  Filled 2013-12-07: qty 2

## 2013-12-07 MED ORDER — KETAMINE HCL 10 MG/ML IJ SOLN
INTRAMUSCULAR | Status: DC | PRN
  Administered 2013-12-07 (×2): 10 mg via INTRAVENOUS

## 2013-12-07 MED ORDER — LACTATED RINGERS IV SOLN
INTRAVENOUS | Status: DC
  Administered 2013-12-07: 1000 mL via INTRAVENOUS

## 2013-12-07 SURGICAL SUPPLY — 22 items

## 2013-12-07 NOTE — Discharge Instructions (Signed)
Colonoscopy, Care After °These instructions give you information on caring for yourself after your procedure. Your doctor may also give you more specific instructions. Call your doctor if you have any problems or questions after your procedure. °HOME CARE °· Do not drive for 24 hours. °· Do not sign important papers or use machinery for 24 hours. °· You may shower. °· You may go back to your usual activities, but go slower for the first 24 hours. °· Take rest breaks often during the first 24 hours. °· Walk around or use warm packs on your belly (abdomen) if you have belly cramping or gas. °· Drink enough fluids to keep your pee (urine) clear or pale yellow. °· Resume your normal diet. Avoid heavy or fried foods. °· Avoid drinking alcohol for 24 hours or as told by your doctor. °· Only take medicines as told by your doctor. °If a tissue sample (biopsy) was taken during the procedure:  °· Do not take aspirin or blood thinners for 7 days, or as told by your doctor. °· Do not drink alcohol for 7 days, or as told by your doctor. °· Eat soft foods for the first 24 hours. °GET HELP IF: °You still have a small amount of blood in your poop (stool) 2-3 days after the procedure. °GET HELP RIGHT AWAY IF: °· You have more than a small amount of blood in your poop. °· You see clumps of tissue (blood clots) in your poop. °· Your belly is puffy (swollen). °· You feel sick to your stomach (nauseous) or throw up (vomit). °· You have a fever. °· You have belly pain that gets worse and medicine does not help. °MAKE SURE YOU: °· Understand these instructions. °· Will watch your condition. °· Will get help right away if you are not doing well or get worse. °Document Released: 03/30/2010 Document Revised: 03/02/2013 Document Reviewed: 11/02/2012 °[employer]® Patient Information ©2015 [employer]. This information is not intended to replace advice given to you by your health care provider. Make sure you discuss any questions you have with  your health care provider. ° °

## 2013-12-07 NOTE — Transfer of Care (Signed)
Immediate Anesthesia Transfer of Care Note  Patient: Jenny Bradford  Procedure(s) Performed: Procedure(s): COLONOSCOPY WITH PROPOFOL (N/A)  Patient Location: PACU and Endoscopy Unit  Anesthesia Type:MAC  Level of Consciousness: awake, alert , oriented and patient cooperative  Airway & Oxygen Therapy: Patient Spontanous Breathing and Patient connected to face mask oxygen  Post-op Assessment: Report given to PACU RN and Post -op Vital signs reviewed and stable  Post vital signs: Reviewed and stable  Complications: No apparent anesthesia complications

## 2013-12-07 NOTE — Op Note (Signed)
Procedure: Screening colonoscopy.  Endoscopist: Earle Gell  Premedication: Propofol administered by anesthesia  Procedure: The patient was placed in the left lateral decubitus position. Anal inspection and digital rectal exam were normal. The Pentax pediatric colonoscope was introduced into the rectum and advanced to the cecum. A normal-appearing appendiceal orifice was identified. A normal-appearing ileocecal valve was identified. Colonic preparation for the exam today was good.  Rectum. Normal. Retroflexed view of the distal rectum normal  Sigmoid colon and descending colon. Left colonic diverticulosis  Splenic flexure. Normal  Transverse colon. Normal  Hepatic flexure. Normal  Ascending colon. Normal  Cecum and ileocecal valve. Normal.  Assessment: Normal screening colonoscopy  Recommendation: Schedule repeat screening colonoscopy in 10 years

## 2013-12-07 NOTE — Anesthesia Preprocedure Evaluation (Signed)
Anesthesia Evaluation  Patient identified by MRN, date of birth, ID band Patient awake    Reviewed: Allergy & Precautions, H&P , NPO status , Patient's Chart, lab work & pertinent test results  History of Anesthesia Complications (+) PONV  Airway Mallampati: III TM Distance: >3 FB Neck ROM: Full    Dental no notable dental hx.    Pulmonary neg pulmonary ROS, sleep apnea ,  breath sounds clear to auscultation  Pulmonary exam normal       Cardiovascular hypertension, Pt. on medications Rhythm:Regular Rate:Normal     Neuro/Psych negative neurological ROS  negative psych ROS   GI/Hepatic Neg liver ROS, hiatal hernia,   Endo/Other  negative endocrine ROS  Renal/GU negative Renal ROS  negative genitourinary   Musculoskeletal negative musculoskeletal ROS (+)   Abdominal   Peds negative pediatric ROS (+)  Hematology negative hematology ROS (+)   Anesthesia Other Findings   Reproductive/Obstetrics negative OB ROS                           Anesthesia Physical Anesthesia Plan  ASA: II  Anesthesia Plan: MAC   Post-op Pain Management:    Induction:   Airway Management Planned: Simple Face Mask  Additional Equipment:   Intra-op Plan:   Post-operative Plan:   Informed Consent: I have reviewed the patients History and Physical, chart, labs and discussed the procedure including the risks, benefits and alternatives for the proposed anesthesia with the patient or authorized representative who has indicated his/her understanding and acceptance.   Dental advisory given  Plan Discussed with: CRNA  Anesthesia Plan Comments:         Anesthesia Quick Evaluation

## 2013-12-07 NOTE — Anesthesia Postprocedure Evaluation (Signed)
  Anesthesia Post-op Note  Patient: Jenny Bradford  Procedure(s) Performed: Procedure(s) (LRB): COLONOSCOPY WITH PROPOFOL (N/A)  Patient Location: PACU  Anesthesia Type: MAC  Level of Consciousness: awake and alert   Airway and Oxygen Therapy: Patient Spontanous Breathing  Post-op Pain: mild  Post-op Assessment: Post-op Vital signs reviewed, Patient's Cardiovascular Status Stable, Respiratory Function Stable, Patent Airway and No signs of Nausea or vomiting  Last Vitals:  Filed Vitals:   12/07/13 0930  BP: 138/78  Pulse:   Temp:   Resp:     Post-op Vital Signs: stable   Complications: No apparent anesthesia complications

## 2013-12-07 NOTE — H&P (Signed)
  Procedure: Screening colonoscopy. Normal screening colonoscopy performed on 09/29/2003  History: The patient is a 63 year old female born 1950/12/07. She is scheduled to undergo a screening colonoscopy with polypectomy to prevent colon cancer.  Past medical history: Hypertension. Hypercholesterolemia. Migraine headache syndrome. Seasonal allergies. Hemorrhoidectomy  Medication allergies: Talwin. Naproxen. Midol. Marcaine  Exam: The patient is alert and lying comfortably on the endoscopy stretcher. Abdomen is soft and nontender to palpation. Lungs are clear to auscultation. Cardiac exam reveals a regular rhythm.  Plan: Proceed with screening colonoscopy

## 2013-12-08 ENCOUNTER — Encounter (HOSPITAL_COMMUNITY): Payer: Self-pay | Admitting: Gastroenterology

## 2013-12-14 NOTE — Addendum Note (Signed)
Addendum created 12/14/13 1046 by Montez Hageman, MD   Modules edited: Anesthesia Responsible Staff

## 2014-06-21 ENCOUNTER — Other Ambulatory Visit: Payer: Self-pay | Admitting: Internal Medicine

## 2014-06-21 DIAGNOSIS — R198 Other specified symptoms and signs involving the digestive system and abdomen: Secondary | ICD-10-CM

## 2014-06-29 ENCOUNTER — Ambulatory Visit
Admission: RE | Admit: 2014-06-29 | Discharge: 2014-06-29 | Disposition: A | Discharge status: Home / Self Care | Payer: 59 | Source: Ambulatory Visit | Attending: Internal Medicine | Admitting: Internal Medicine

## 2014-06-29 DIAGNOSIS — R198 Other specified symptoms and signs involving the digestive system and abdomen: Secondary | ICD-10-CM

## 2014-07-21 ENCOUNTER — Encounter: Payer: Self-pay | Admitting: Surgery

## 2014-07-22 ENCOUNTER — Ambulatory Visit (INDEPENDENT_AMBULATORY_CARE_PROVIDER_SITE_OTHER): Payer: 59 | Admitting: Surgery

## 2014-07-22 ENCOUNTER — Encounter: Payer: Self-pay | Admitting: Surgery

## 2014-07-22 VITALS — BP 134/87 | HR 65 | Resp 16 | Ht 63.0 in | Wt 163.0 lb

## 2014-07-22 DIAGNOSIS — I714 Abdominal aortic aneurysm, without rupture, unspecified: Secondary | ICD-10-CM

## 2014-07-22 NOTE — Progress Notes (Signed)
Patient name: Jenny Bradford MRN: [id number] DOB: 22-Apr-1950 Sex: female   Referred by: Dr. Minna Antis  Reason for referral:  Chief Complaint  Patient presents with  . New Evaluation    aaa  referred by Dr Minna Antis    HISTORY OF PRESENT ILLNESS: As is a 64 year old female who comes in today for further management of an abdominal aortic aneurysm.  This was initially detected on an ultrasound.  Maximum diameter was 2.3 cm.  She does have a significant family history of aneurysms in her father and uncle.  She is medically managed for hypertension which has been fairly well controlled with systolics in the 188C.  She does take an ARB.  She is medically managed for hypercholesterolemia with a statin.  She is a nonsmoker.  Past Medical History  Diagnosis Date  . Hypertension 04/01/11    ECHO-EF>55% NUC STRESS TEST- 05/01/12  . Hyperlipidemia   . GERD (gastroesophageal reflux disease)   . H/O hiatal hernia   . Headache(784.0)     migraines  . Arthritis     oa  . Complication of anesthesia     slow to awaken in past  . PONV (postoperative nausea and vomiting)   . Family history of anesthesia complication     slow to awaken  . Sleep apnea 05/01/12 & 05/29/12    SLEEP STUDY-[place] [facility], NO CPAP USED SINCE MAY 2015    Past Surgical History  Procedure Laterality Date  . [place]  . Foot surgery Right 9.23.14  . Foot surgery Left 10.01.13  . Cystectomy  1975  . Ankle surgery Right 1980  . Hemorrhoid surgery  2005  . Tonsillectomy  1964  . Colonoscopy with propofol N/A 12/07/2013    Procedure: COLONOSCOPY WITH PROPOFOL;  Surgeon: Garlan Fair, MD;  Location: [facility];  Service: Endoscopy;  Laterality: N/A;    History   Social History  . Marital Status: Married    Spouse Name: N/A  . Number of Children: N/A  . Years of Education: N/A   Occupational History  . Not on file.   Social History Main Topics  . Smoking status: Never Smoker   . Smokeless  tobacco: Never Used  . Alcohol Use: No  . Drug Use: No  . Sexual Activity: Not on file   Other Topics Concern  . Not on file   Social History Narrative    Family History  Problem Relation Age of Onset  . Breast cancer Mother   . Ovarian cancer Mother   . Heart attack Father   . Heart disease Father   . Hypertension Brother   . Hyperlipidemia Maternal Grandmother   . Hypertension Maternal Grandmother     Allergies as of 07/22/2014 - Review Complete 07/22/2014  Allergen Reaction Noted  . Darvon [propoxyphene hcl] Other (See Comments) 05/01/2012  . Marcaine [bupivacaine hcl] Other (See Comments) 05/01/2012  . Midol [aspirin-cinnamedrine-caffeine] Other (See Comments) 05/01/2012  . Talwin [pentazocine] Other (See Comments) 05/01/2012  . Naproxen Rash 05/01/2012    Current Outpatient Prescriptions on File Prior to Visit  Medication Sig Dispense Refill  . acetaminophen (TYLENOL) 500 MG tablet Take 500 mg by mouth 2 (two) times daily.    Marland Kitchen ALPRAZolam (XANAX) 0.25 MG tablet Take 0.25 mg by mouth at bedtime as needed for sleep.     . butalbital-acetaminophen-caffeine (FIORICET, ESGIC) 50-325-40 MG per tablet Take 1 tablet by mouth 2 (two) times daily as needed for headache.    Marland Kitchen  calcium carbonate (TUMS) 500 MG chewable tablet Chew 1 tablet by mouth daily as needed for indigestion.     . cetirizine (ZYRTEC) 10 MG tablet Take 10 mg by mouth as needed for allergies.    Marland Kitchen escitalopram (LEXAPRO) 20 MG tablet Take 20 mg by mouth every morning.     Marland Kitchen estradiol (ESTRACE) 0.1 MG/GM vaginal cream Place 2 g vaginally 2 (two) times a week. USE TWICE WEEKLY    . Glucosamine-Chondroit-Vit C-Mn (GLUCOSAMINE CHONDR 1500 COMPLX PO) Take 1,500 mg by mouth daily. Usually only takes in the winter time.    . hydrochlorothiazide (HYDRODIURIL) 25 MG tablet Take 25 mg by mouth every morning.     . loperamide (IMODIUM) 2 MG capsule Take 2 mg by mouth once.    Marland Kitchen losartan (COZAAR) 50 MG tablet Take 50 mg by  mouth every morning.     . pravastatin (PRAVACHOL) 40 MG tablet Take 40 mg by mouth at bedtime.     . Ranitidine HCl (ZANTAC 150 MAXIMUM STRENGTH PO) Take by mouth 2 (two) times daily.    . celecoxib (CELEBREX) 100 MG capsule Take 100 mg by mouth as needed for pain.    . pseudoephedrine (SUDAFED) 30 MG tablet Take 30 mg by mouth as needed for congestion.    . SUMAtriptan (IMITREX) 50 MG tablet Take 50 mg by mouth as needed for migraine.     Current Facility-Administered Medications on File Prior to Visit  Medication Dose Route Frequency Provider Last Rate Last Dose  . triamcinolone acetonide (KENALOG) 10 MG/ML injection 10 mg  10 mg Other Once Wallene Huh, DPM         REVIEW OF SYSTEMS: Cardiovascular: No chest pain, chest pressure, palpitations, orthopnea, or dyspnea on exertion. No claudication or rest pain,  No history of DVT or phlebitis. Pulmonary: No productive cough, asthma or wheezing. Neurologic: No weakness, paresthesias, aphasia, or amaurosis. No dizziness. Hematologic: No bleeding problems or clotting disorders. Musculoskeletal: No joint pain or joint swelling. Gastrointestinal: No blood in stool or hematemesis Genitourinary: No dysuria or hematuria. Psychiatric:: No history of major depression. Integumentary: No rashes or ulcers. Constitutional: No fever or chills.  PHYSICAL EXAMINATION:  Filed Vitals:   07/22/14 1336  BP: 134/87  Pulse: 65  Resp: 16  Height: 5\' 3"  (1.6 m)  Weight: 163 lb (73.936 kg)   Body mass index is 28.88 kg/(m^2). General: The patient appears their stated age.   HEENT:  No gross abnormalities Pulmonary: Respirations are non-labored Abdomen: Soft and non-tender.  No pulsatile mass  Musculoskeletal: There are no major deformities.   Neurologic: No focal weakness or paresthesias are detected, Skin: There are no ulcer or rashes noted. Psychiatric: The patient has normal affect. Cardiovascular: There is a regular rate and rhythm without  significant murmur appreciated.  Palpable pedal pulses  Diagnostic Studies: I have reviewed her ultrasound which shows a infrarenal abdominal aortic aneurysm measuring 2.3 cm    Assessment:  Small abdominal aortic aneurysm Plan: We reviewed the ultrasound findings.  I have recommended continued surveillance.  She would like to have the ultrasound in 2 years which I think is reasonable given her family history.  She is very concerned that she has this, given her father's recent bout with a similar problem.  She'll follow up in 2 years.  I have recommended lowering her blood pressure to a systolic less than 4:25.  She is going to try and improve her diet and exercise more to lose weight.  Eldridge Abrahams, M.D. [facility] Office: [phone] Pager:  [phone]

## 2014-07-25 ENCOUNTER — Other Ambulatory Visit: Payer: Self-pay | Admitting: *Deleted

## 2014-07-25 DIAGNOSIS — I714 Abdominal aortic aneurysm, without rupture, unspecified: Secondary | ICD-10-CM

## 2014-08-04 ENCOUNTER — Other Ambulatory Visit (HOSPITAL_COMMUNITY): Payer: Self-pay | Admitting: Obstetrics & Gynecology

## 2014-08-04 DIAGNOSIS — Z1231 Encounter for screening mammogram for malignant neoplasm of breast: Secondary | ICD-10-CM

## 2014-08-09 ENCOUNTER — Ambulatory Visit (HOSPITAL_COMMUNITY)
Admission: RE | Admit: 2014-08-09 | Discharge: 2014-08-09 | Disposition: A | Discharge status: Home / Self Care | Payer: 59 | Source: Ambulatory Visit | Attending: Obstetrics & Gynecology | Admitting: Obstetrics & Gynecology

## 2014-08-09 DIAGNOSIS — Z1231 Encounter for screening mammogram for malignant neoplasm of breast: Secondary | ICD-10-CM | POA: Insufficient documentation

## 2014-08-10 DIAGNOSIS — I714 Abdominal aortic aneurysm, without rupture, unspecified: Secondary | ICD-10-CM

## 2014-08-10 HISTORY — DX: Abdominal aortic aneurysm, without rupture, unspecified: I71.40

## 2014-08-10 HISTORY — DX: Abdominal aortic aneurysm, without rupture: I71.4

## 2014-12-08 ENCOUNTER — Encounter (HOSPITAL_BASED_OUTPATIENT_CLINIC_OR_DEPARTMENT_OTHER): Payer: Self-pay | Admitting: *Deleted

## 2014-12-14 ENCOUNTER — Encounter (HOSPITAL_BASED_OUTPATIENT_CLINIC_OR_DEPARTMENT_OTHER): Payer: Self-pay | Admitting: *Deleted

## 2014-12-14 ENCOUNTER — Ambulatory Visit (HOSPITAL_BASED_OUTPATIENT_CLINIC_OR_DEPARTMENT_OTHER): Payer: 59 | Admitting: Certified Registered"

## 2014-12-14 ENCOUNTER — Ambulatory Visit (HOSPITAL_BASED_OUTPATIENT_CLINIC_OR_DEPARTMENT_OTHER)
Admission: RE | Admit: 2014-12-14 | Discharge: 2014-12-14 | Disposition: A | Discharge status: Home / Self Care | Payer: 59 | Source: Ambulatory Visit | Attending: Orthopaedic Surgery | Admitting: Orthopaedic Surgery

## 2014-12-14 ENCOUNTER — Encounter (HOSPITAL_BASED_OUTPATIENT_CLINIC_OR_DEPARTMENT_OTHER)
Admission: RE | Disposition: A | Discharge status: Home / Self Care | Payer: Self-pay | Source: Ambulatory Visit | Attending: Orthopaedic Surgery

## 2014-12-14 DIAGNOSIS — E039 Hypothyroidism, unspecified: Secondary | ICD-10-CM | POA: Diagnosis not present

## 2014-12-14 DIAGNOSIS — E785 Hyperlipidemia, unspecified: Secondary | ICD-10-CM | POA: Insufficient documentation

## 2014-12-14 DIAGNOSIS — M13841 Other specified arthritis, right hand: Secondary | ICD-10-CM | POA: Insufficient documentation

## 2014-12-14 DIAGNOSIS — G4730 Sleep apnea, unspecified: Secondary | ICD-10-CM | POA: Insufficient documentation

## 2014-12-14 DIAGNOSIS — I10 Essential (primary) hypertension: Secondary | ICD-10-CM | POA: Insufficient documentation

## 2014-12-14 DIAGNOSIS — K219 Gastro-esophageal reflux disease without esophagitis: Secondary | ICD-10-CM | POA: Insufficient documentation

## 2014-12-14 HISTORY — DX: Hypothyroidism, unspecified: E03.9

## 2014-12-14 HISTORY — PX: CARPOMETACARPEL SUSPENSION PLASTY: SHX5005

## 2014-12-14 SURGERY — CARPOMETACARPEL (CMC) SUSPENSION PLASTY
Anesthesia: Regional | Site: Thumb | Laterality: Right

## 2014-12-14 MED ORDER — LIDOCAINE HCL (CARDIAC) 20 MG/ML IV SOLN
INTRAVENOUS | Status: AC
  Filled 2014-12-14: qty 5

## 2014-12-14 MED ORDER — MIDAZOLAM HCL 2 MG/2ML IJ SOLN
1.0000 mg | INTRAMUSCULAR | Status: DC | PRN
  Administered 2014-12-14: 2 mg via INTRAVENOUS

## 2014-12-14 MED ORDER — MEPERIDINE HCL 25 MG/ML IJ SOLN: 6.2500 mg | INTRAMUSCULAR | Status: DC | PRN

## 2014-12-14 MED ORDER — CEFAZOLIN SODIUM-DEXTROSE 2-3 GM-% IV SOLR
2.0000 g | INTRAVENOUS | Status: AC
  Administered 2014-12-14: 2 g via INTRAVENOUS

## 2014-12-14 MED ORDER — HYDROMORPHONE HCL 1 MG/ML IJ SOLN: 0.2500 mg | INTRAMUSCULAR | Status: DC | PRN

## 2014-12-14 MED ORDER — LIDOCAINE HCL (CARDIAC) 20 MG/ML IV SOLN
INTRAVENOUS | Status: DC | PRN
  Administered 2014-12-14: 20 mg via INTRAVENOUS

## 2014-12-14 MED ORDER — MIDAZOLAM HCL 2 MG/2ML IJ SOLN
INTRAMUSCULAR | Status: AC
  Filled 2014-12-14: qty 2

## 2014-12-14 MED ORDER — BUPIVACAINE-EPINEPHRINE (PF) 0.5% -1:200000 IJ SOLN
INTRAMUSCULAR | Status: DC | PRN
  Administered 2014-12-14: 23 mL via PERINEURAL

## 2014-12-14 MED ORDER — EPHEDRINE SULFATE 50 MG/ML IJ SOLN
INTRAMUSCULAR | Status: DC | PRN
  Administered 2014-12-14: 10 mg via INTRAVENOUS

## 2014-12-14 MED ORDER — OXYCODONE HCL 5 MG PO TABS: 5.0000 mg | ORAL_TABLET | Freq: Once | ORAL | Status: DC | PRN

## 2014-12-14 MED ORDER — HYDROCODONE-ACETAMINOPHEN 7.5-325 MG PO TABS: 1.0000 | ORAL_TABLET | Freq: Four times a day (QID) | ORAL | Status: DC | PRN

## 2014-12-14 MED ORDER — DEXAMETHASONE SODIUM PHOSPHATE 10 MG/ML IJ SOLN
INTRAMUSCULAR | Status: AC
  Filled 2014-12-14: qty 1

## 2014-12-14 MED ORDER — PROPOFOL 10 MG/ML IV BOLUS
INTRAVENOUS | Status: DC | PRN
  Administered 2014-12-14: 150 mg via INTRAVENOUS

## 2014-12-14 MED ORDER — FENTANYL CITRATE (PF) 100 MCG/2ML IJ SOLN
50.0000 ug | INTRAMUSCULAR | Status: DC | PRN
  Administered 2014-12-14: 100 ug via INTRAVENOUS

## 2014-12-14 MED ORDER — LACTATED RINGERS IV SOLN
INTRAVENOUS | Status: DC
  Administered 2014-12-14 (×2): via INTRAVENOUS

## 2014-12-14 MED ORDER — PROPOFOL 500 MG/50ML IV EMUL
INTRAVENOUS | Status: AC
  Filled 2014-12-14: qty 50

## 2014-12-14 MED ORDER — GLYCOPYRROLATE 0.2 MG/ML IJ SOLN: 0.2000 mg | Freq: Once | INTRAMUSCULAR | Status: DC | PRN

## 2014-12-14 MED ORDER — ONDANSETRON HCL 4 MG/2ML IJ SOLN
INTRAMUSCULAR | Status: DC | PRN
  Administered 2014-12-14: 4 mg via INTRAVENOUS

## 2014-12-14 MED ORDER — FENTANYL CITRATE (PF) 100 MCG/2ML IJ SOLN
INTRAMUSCULAR | Status: AC
  Filled 2014-12-14: qty 2

## 2014-12-14 MED ORDER — DEXAMETHASONE SODIUM PHOSPHATE 10 MG/ML IJ SOLN
INTRAMUSCULAR | Status: DC | PRN
  Administered 2014-12-14: 10 mg via INTRAVENOUS

## 2014-12-14 MED ORDER — ONDANSETRON HCL 4 MG/2ML IJ SOLN
INTRAMUSCULAR | Status: AC
  Filled 2014-12-14: qty 2

## 2014-12-14 MED ORDER — EPHEDRINE SULFATE 50 MG/ML IJ SOLN
INTRAMUSCULAR | Status: AC
Start: 2014-12-14 — End: 2014-12-14
  Filled 2014-12-14: qty 1

## 2014-12-14 MED ORDER — SCOPOLAMINE 1 MG/3DAYS TD PT72
1.0000 | MEDICATED_PATCH | Freq: Once | TRANSDERMAL | Status: DC | PRN
Start: 2014-12-14 — End: 2014-12-14

## 2014-12-14 MED ORDER — OXYCODONE HCL 5 MG/5ML PO SOLN: 5.0000 mg | Freq: Once | ORAL | Status: DC | PRN

## 2014-12-14 MED ORDER — CEFAZOLIN SODIUM-DEXTROSE 2-3 GM-% IV SOLR
INTRAVENOUS | Status: AC
  Filled 2014-12-14: qty 50

## 2014-12-14 SURGICAL SUPPLY — 68 items
BANDAGE ELASTIC 3 VELCRO ST LF (GAUZE/BANDAGES/DRESSINGS) ×2 IMPLANT
BLADE MINI RND TIP GREEN BEAV (BLADE) ×3 IMPLANT
BLADE SURG 15 STRL LF DISP TIS (BLADE) ×1 IMPLANT
BLADE SURG 15 STRL SS (BLADE) ×2
BNDG CMPR 9X4 STRL LF SNTH (GAUZE/BANDAGES/DRESSINGS) ×1
BNDG ESMARK 4X9 LF (GAUZE/BANDAGES/DRESSINGS) ×2 IMPLANT
BNDG GAUZE ELAST 4 BULKY (GAUZE/BANDAGES/DRESSINGS) ×1 IMPLANT
CANISTER SUCT 1200ML W/VALVE (MISCELLANEOUS) ×1 IMPLANT
CORDS BIPOLAR (ELECTRODE) ×2 IMPLANT
COVER BACK TABLE 60X90IN (DRAPES) ×2 IMPLANT
COVER MAYO STAND STRL (DRAPES) ×2 IMPLANT
CUFF TOURNIQUET SINGLE 18IN (TOURNIQUET CUFF) ×1 IMPLANT
DECANTER SPIKE VIAL GLASS SM (MISCELLANEOUS) IMPLANT
DRAPE EXTREMITY T 121X128X90 (DRAPE) ×2 IMPLANT
DRAPE OEC MINIVIEW 54X84 (DRAPES) ×2 IMPLANT
DRAPE SURG 17X23 STRL (DRAPES) ×2 IMPLANT
GAUZE SPONGE 4X4 12PLY STRL (GAUZE/BANDAGES/DRESSINGS) ×2 IMPLANT
GAUZE SPONGE 4X4 16PLY XRAY LF (GAUZE/BANDAGES/DRESSINGS) IMPLANT
GAUZE XEROFORM 1X8 LF (GAUZE/BANDAGES/DRESSINGS) ×2 IMPLANT
GLOVE BIOGEL PI IND STRL 7.0 (GLOVE) IMPLANT
GLOVE BIOGEL PI IND STRL 7.5 (GLOVE) IMPLANT
GLOVE BIOGEL PI INDICATOR 7.0 (GLOVE) ×2
GLOVE BIOGEL PI INDICATOR 7.5 (GLOVE) ×1
GLOVE ECLIPSE 6.5 STRL STRAW (GLOVE) ×1 IMPLANT
GLOVE NEODERM STRL 7.5 LF PF (GLOVE) ×1 IMPLANT
GLOVE SURG NEODERM 7.5  LF PF (GLOVE) ×1
GLOVE SURG SS PI 7.0 STRL IVOR (GLOVE) ×1 IMPLANT
GLOVE SURG SYN 7.5  E (GLOVE) ×2
GLOVE SURG SYN 7.5 E (GLOVE) ×2 IMPLANT
GLOVE SURG SYN 7.5 PF PI (GLOVE) ×1 IMPLANT
GOWN PREVENTION PLUS XLARGE (GOWN DISPOSABLE) ×2 IMPLANT
GOWN STRL REIN XL XLG (GOWN DISPOSABLE) ×1 IMPLANT
GOWN STRL REUS W/ TWL LRG LVL3 (GOWN DISPOSABLE) IMPLANT
GOWN STRL REUS W/ TWL XL LVL3 (GOWN DISPOSABLE) IMPLANT
GOWN STRL REUS W/TWL LRG LVL3 (GOWN DISPOSABLE) ×2
GOWN STRL REUS W/TWL XL LVL3 (GOWN DISPOSABLE) ×2
KIT ASCP FXDISP 3X8XBTNDS (KITS) IMPLANT
KIT BIO-TENODESIS 3X8 DISP (KITS) ×2
NDL HYPO 25X1 1.5 SAFETY (NEEDLE) IMPLANT
NEEDLE HYPO 25X1 1.5 SAFETY (NEEDLE) IMPLANT
NS IRRIG 1000ML POUR BTL (IV SOLUTION) ×2 IMPLANT
PACK BASIN DAY SURGERY FS (CUSTOM PROCEDURE TRAY) ×2 IMPLANT
PAD CAST 3X4 CTTN HI CHSV (CAST SUPPLIES) ×1 IMPLANT
PADDING CAST ABS 3INX4YD NS (CAST SUPPLIES)
PADDING CAST ABS 4INX4YD NS (CAST SUPPLIES) ×1
PADDING CAST ABS COTTON 3X4 (CAST SUPPLIES) IMPLANT
PADDING CAST ABS COTTON 4X4 ST (CAST SUPPLIES) ×1 IMPLANT
PADDING CAST COTTON 3X4 STRL (CAST SUPPLIES) ×2
SCREW BIOCOM TENODESIS 3.8X8M (Screw) ×1 IMPLANT
SLEEVE SCD COMPRESS KNEE MED (MISCELLANEOUS) ×2 IMPLANT
SLING ARM MED ADULT FOAM STRAP (SOFTGOODS) ×1 IMPLANT
SPLINT FIBERGLASS 3X35 (CAST SUPPLIES) IMPLANT
SPLINT PLASTER CAST XFAST 3X15 (CAST SUPPLIES) IMPLANT
SPLINT PLASTER XTRA FASTSET 3X (CAST SUPPLIES) ×10
STOCKINETTE 4X48 STRL (DRAPES) ×2 IMPLANT
SUCTION FRAZIER TIP 10 FR DISP (SUCTIONS) ×1 IMPLANT
SUT ETHIBOND 0 MO6 C/R (SUTURE) ×2 IMPLANT
SUT ETHILON 4 0 PS 2 18 (SUTURE) ×2 IMPLANT
SUT VIC AB 0 SH 27 (SUTURE) IMPLANT
SUT VIC AB 2-0 SH 27 (SUTURE)
SUT VIC AB 2-0 SH 27XBRD (SUTURE) IMPLANT
SYR BULB 3OZ (MISCELLANEOUS) ×2 IMPLANT
SYR CONTROL 10ML LL (SYRINGE) IMPLANT
TOWEL OR 17X24 6PK STRL BLUE (TOWEL DISPOSABLE) ×4 IMPLANT
TOWEL OR NON WOVEN STRL DISP B (DISPOSABLE) ×1 IMPLANT
TRAY DSU PREP LF (CUSTOM PROCEDURE TRAY) ×2 IMPLANT
TUBE CONNECTING 20X1/4 (TUBING) ×1 IMPLANT
UNDERPAD 30X30 (UNDERPADS AND DIAPERS) ×2 IMPLANT

## 2014-12-14 NOTE — Anesthesia Procedure Notes (Addendum)
Anesthesia Regional Block:  Supraclavicular block  Pre-Anesthetic Checklist: ,, timeout performed, Correct Patient, Correct Site, Correct Laterality, Correct Procedure, Correct Position, site marked, Risks and benefits discussed,  Surgical consent,  Pre-op evaluation,  At surgeon's request and post-op pain management  Laterality: Right and Upper  Prep: chloraprep       Needles:  Injection technique: Single-shot  Needle Type: Echogenic Stimulator Needle     Needle Length: 5cm 5 cm Needle Gauge: 21 and 21 G    Additional Needles:  Procedures: ultrasound guided (picture in chart) Supraclavicular block Narrative:  Start time: 12/14/2014 8:12 AM End time: 12/14/2014 8:17 AM Injection made incrementally with aspirations every 5 mL.  Performed by: Personally  Anesthesiologist: CREWS, DAVID   Procedure Name: LMA Insertion Date/Time: 12/14/2014 8:38 AM Performed by: Montine Hight D Pre-anesthesia Checklist: Patient identified, Emergency Drugs available, Suction available and Patient being monitored Patient Re-evaluated:Patient Re-evaluated prior to inductionOxygen Delivery Method: Circle System Utilized Preoxygenation: Pre-oxygenation with 100% oxygen Intubation Type: IV induction Ventilation: Mask ventilation without difficulty LMA: LMA inserted LMA Size: 4.0 Number of attempts: 1 Airway Equipment and Method: Bite block Placement Confirmation: positive ETCO2 Tube secured with: Tape Dental Injury: Teeth and Oropharynx as per pre-operative assessment

## 2014-12-14 NOTE — Op Note (Signed)
   DATE OF SURGERY:12/14/2014  PREOPERATIVE DIAGNOSIS: Right basal joint arthritis.  POSTOPERATIVE DIAGNOSIS: same.  PROCEDURE:  1. Right thumb carpometacarpal interposition arthroplasty ([id number]); thumb  2. Right carpometacarpal reconstruction with tendon graft ([id number]).    SURGEON: N. Eduard Roux, MD  ASSISTANT: none  ANESTHESIA: regional and general  TOURNIQUET TIME: 54 minutes.  BLOOD LOSS: Minimal.  COMPLICATIONS: None.  PATHOLOGY: None.  TIME OUT: A time out was performed before the procedure started.  INDICATIONS FOR PROCEDURE: The patient presents today for the above mentioned procedure after failing extensive conservative measures.  The risks, benefits, and alternatives to surgery were discussed and the patient elected to proceed with surgery.  DESCRIPTION OF PROCEDURE: A Wagner's incision was made. The superficial radial nerve was identified and protected. Radial artery was exposed and protected. We dissected sharply in between the extensor pollicis brevis and abductor pollicis longus tendon interval. Capsulotomy was performed. Capsular flaps were raised. Fluoroscopy was used to identify the borders of the trapezium.  Trapezium was exposed and removed in a piecemeal fashion. One drill hole was then made over the radial aspect of the first metacarpal, pointing in a volar ulnar direction.  I then made a second incision about 5 cm proximal to the wrist crease. Flexor carpi radialis tendon was exposed and transected. The FCR tendon then was delivered out through the Wagner's incision.  The tendon was then passed through the drill hole and a 3.0 mm arthrex tenodesis screw was placed.  The excess tendon was excised.  The wound was irrigated. The capsular tissue was repaired using 2-0 Vicryl sutures. Then tourniquet was deflated. Hemostasis achieved. Wounds were irrigated and closed using 3-0 nylon sutures. Sterile dressing applied, hand immobilized in a thumb spica splint. The  patient then was transferred to the recovery room in stable condition after all counts were correct.  POSTOPERATIVE PLAN: Return in two weeks for suture removal.  Azucena Cecil, MD [facility] [phone] 10:06 AM

## 2014-12-14 NOTE — Progress Notes (Signed)
Assisted Dr. Crews with right, ultrasound guided, supraclavicular block. Side rails up, monitors on throughout procedure. See vital signs in flow sheet. Tolerated Procedure well. 

## 2014-12-14 NOTE — H&P (Signed)
PREOPERATIVE H&P  Chief Complaint: Right thumb carpometacarpal arthritis  HPI: Jenny Bradford is a 64 y.o. female who presents for surgical treatment of Right thumb carpometacarpal arthritis.  She denies any changes in medical history.  Past Medical History  Diagnosis Date  . Hypertension 04/01/11    ECHO-EF>55% NUC STRESS TEST- 05/01/12  . Hyperlipidemia   . GERD (gastroesophageal reflux disease)   . H/O hiatal hernia   . Headache(784.0)     migraines  . Arthritis     oa  . Complication of anesthesia     slow to awaken in past  . PONV (postoperative nausea and vomiting)   . Family history of anesthesia complication     slow to awaken  . Sleep apnea 05/01/12 & 05/29/12    SLEEP STUDY-[place], NO CPAP USED SINCE MAY 2015  . Hypothyroidism    Past Surgical History  Procedure Laterality Date  . [place]  . Foot surgery Right 9.23.14  . Foot surgery Left 10.01.13  . Cystectomy  1975  . Ankle surgery Right 1980  . Hemorrhoid surgery  2005  . Tonsillectomy  1964  . Colonoscopy with propofol N/A 12/07/2013    Procedure: COLONOSCOPY WITH PROPOFOL;  Surgeon: Garlan Fair, MD;  Location: [facility];  Service: Endoscopy;  Laterality: N/A;   Social History   Social History  . Marital Status: Married    Spouse Name: N/A  . Number of Children: N/A  . Years of Education: N/A   Social History Main Topics  . Smoking status: Never Smoker   . Smokeless tobacco: Never Used  . Alcohol Use: No  . Drug Use: No  . Sexual Activity: Not Asked   Other Topics Concern  . None   Social History Narrative   Family History  Problem Relation Age of Onset  . Breast cancer Mother   . Ovarian cancer Mother   . Heart attack Father   . Heart disease Father   . Hypertension Brother   . Hyperlipidemia Maternal Grandmother   . Hypertension Maternal Grandmother    Allergies  Allergen Reactions  . Darvon [Propoxyphene Hcl] Other (See Comments)   DIZZINESS  . Marcaine [Bupivacaine Hcl] Other (See Comments)    SYNCOPE   . Midol [Aspirin-Cinnamedrine-Caffeine] Other (See Comments)    DIZZINESS  . Talwin [Pentazocine] Other (See Comments)    EXTREME DROWSINESS  . Naproxen Rash   Prior to Admission medications   Medication Sig Start Date End Date Taking? Authorizing Provider  acetaminophen (TYLENOL) 500 MG tablet Take 500 mg by mouth 2 (two) times daily.   Yes Historical Provider, MD  ALPRAZolam Duanne Moron) 0.25 MG tablet Take 0.25 mg by mouth at bedtime as needed for sleep.    Yes Historical Provider, MD  butalbital-acetaminophen-caffeine (FIORICET, ESGIC) 50-325-40 MG per tablet Take 1 tablet by mouth 2 (two) times daily as needed for headache.   Yes Historical Provider, MD  calcium carbonate (TUMS) 500 MG chewable tablet Chew 1 tablet by mouth daily as needed for indigestion.    Yes Historical Provider, MD  escitalopram (LEXAPRO) 20 MG tablet Take 20 mg by mouth every morning.    Yes Historical Provider, MD  estradiol (ESTRACE) 0.1 MG/GM vaginal cream Place 2 g vaginally 2 (two) times a week. USE TWICE WEEKLY   Yes Historical Provider, MD  hydrochlorothiazide (HYDRODIURIL) 25 MG tablet Take 25 mg by mouth every morning.    Yes Historical Provider, MD  levothyroxine (SYNTHROID, LEVOTHROID)  112 MCG tablet Take 112 mcg by mouth daily before breakfast.   Yes Historical Provider, MD  losartan (COZAAR) 50 MG tablet Take 75 mg by mouth every morning.    Yes Historical Provider, MD  pravastatin (PRAVACHOL) 40 MG tablet Take 40 mg by mouth at bedtime.    Yes Historical Provider, MD  Ranitidine HCl (ZANTAC 150 MAXIMUM STRENGTH PO) Take by mouth 2 (two) times daily.   Yes Historical Provider, MD  traMADol (ULTRAM) 50 MG tablet Take 50 mg by mouth every 6 (six) hours as needed.   Yes Historical Provider, MD  SUMAtriptan (IMITREX) 50 MG tablet Take 50 mg by mouth as needed for migraine.    Historical Provider, MD     Positive ROS: All other systems  have been reviewed and were otherwise negative with the exception of those mentioned in the HPI and as above.  Physical Exam: General: Alert, no acute distress Cardiovascular: No pedal edema Respiratory: No cyanosis, no use of accessory musculature GI: abdomen soft Skin: No lesions in the area of chief complaint Neurologic: Sensation intact distally Psychiatric: Patient is competent for consent with normal mood and affect Lymphatic: no lymphedema  MUSCULOSKELETAL: exam stable  Assessment: Right thumb carpometacarpal arthritis  Plan: Plan for Procedure(s): RIGHT THUMB CARPOMETACARPAL (CMC) ARTHROPLASTY  The risks benefits and alternatives were discussed with the patient including but not limited to the risks of nonoperative treatment, versus surgical intervention including infection, bleeding, nerve injury,  blood clots, cardiopulmonary complications, morbidity, mortality, among others, and they were willing to proceed.   Marianna Payment, MD   12/14/2014 7:46 AM

## 2014-12-14 NOTE — Transfer of Care (Signed)
Immediate Anesthesia Transfer of Care Note  Patient: Jenny Bradford  Procedure(s) Performed: Procedure(s): RIGHT THUMB CARPOMETACARPAL (CMC) ARTHROPLASTY (Right)  Patient Location: PACU  Anesthesia Type:GA combined with regional for post-op pain  Level of Consciousness: awake and patient cooperative  Airway & Oxygen Therapy: Patient Spontanous Breathing and Patient connected to face mask oxygen  Post-op Assessment: Report given to RN and Post -op Vital signs reviewed and stable  Post vital signs: Reviewed and stable  Last Vitals:  Filed Vitals:   12/14/14 1003  BP:   Pulse: 63  Temp:   Resp: 18    Complications: No apparent anesthesia complications

## 2014-12-14 NOTE — Discharge Instructions (Signed)
Postoperative instructions:  Weightbearing: Non weight bearign  Dressing instructions: Keep your dressing and/or splint clean and dry at all times.  It will be removed at your first post-operative appointment.  Your stitches and/or staples will be removed at this visit.  Incision instructions:  Do not soak your incision for 3 weeks after surgery.  If the incision gets wet, pat dry and do not scrub the incision.  Pain control:  You have been given a prescription to be taken as directed for post-operative pain control.  In addition, elevate the operative extremity above the heart at all times to prevent swelling and throbbing pain.  Take over-the-counter Colace, 100mg  by mouth twice a day while taking narcotic pain medications to help prevent constipation.  Follow up appointments: 1) 10-14 days for suture removal and wound check. 2) Dr. Erlinda Hong as scheduled.   -------------------------------------------------------------------------------------------------------------  After Surgery Pain Control:  After your surgery, post-surgical discomfort or pain is likely. This discomfort can last several days to a few weeks. At certain times of the day your discomfort may be more intense.  Did you receive a nerve block?  A nerve block can provide pain relief for one hour to two days after your surgery. As long as the nerve block is working, you will experience little or no sensation in the area the surgeon operated on.  As the nerve block wears off, you will begin to experience pain or discomfort. It is very important that you begin taking your prescribed pain medication before the nerve block fully wears off. Treating your pain at the first sign of the block wearing off will ensure your pain is better controlled and more tolerable when full-sensation returns. Do not wait until the pain is intolerable, as the medicine will be less effective. It is better to treat pain in advance than to try and catch up.    General Anesthesia:  If you did not receive a nerve block during your surgery, you will need to start taking your pain medication shortly after your surgery and should continue to do so as prescribed by your surgeon.  Pain Medication:  Most commonly we prescribe Vicodin and Percocet for post-operative pain. Both of these medications contain a combination of acetaminophen (Tylenol) and a narcotic to help control pain.   It takes between 30 and 45 minutes before pain medication starts to work. It is important to take your medication before your pain level gets too intense.   Nausea is a common side effect of many pain medications. You will want to eat something before taking your pain medicine to help prevent nausea.   If you are taking a prescription pain medication that contains acetaminophen, we recommend that you do not take additional over the counter acetaminophen (Tylenol).  Other pain relieving options:   Using a cold pack to ice the affected area a few times a day (15 to 20 minutes at a time) can help to relieve pain, reduce swelling and bruising.   Elevation of the affected area can also help to reduce pain and swelling.       Post Anesthesia Home Care Instructions  Activity: Get plenty of rest for the remainder of the day. A responsible adult should stay with you for 24 hours following the procedure.  For the next 24 hours, DO NOT: -Drive a car -Paediatric nurse -Drink alcoholic beverages -Take any medication unless instructed by your physician -Make any legal decisions or sign important papers.  Meals: Start with liquid foods such  as gelatin or soup. Progress to regular foods as tolerated. Avoid greasy, spicy, heavy foods. If nausea and/or vomiting occur, drink only clear liquids until the nausea and/or vomiting subsides. Call your physician if vomiting continues.  Special Instructions/Symptoms: Your throat may feel dry or sore from the anesthesia or the breathing  tube placed in your throat during surgery. If this causes discomfort, gargle with warm salt water. The discomfort should disappear within 24 hours.  If you had a scopolamine patch placed behind your ear for the management of post- operative nausea and/or vomiting:  1. The medication in the patch is effective for 72 hours, after which it should be removed.  Wrap patch in a tissue and discard in the trash. Wash hands thoroughly with soap and water. 2. You may remove the patch earlier than 72 hours if you experience unpleasant side effects which may include dry mouth, dizziness or visual disturbances. 3. Avoid touching the patch. Wash your hands with soap and water after contact with the patch.  Regional Anesthesia Blocks  1. Numbness or the inability to move the "blocked" extremity may last from 3-48 hours after placement. The length of time depends on the medication injected and your individual response to the medication. If the numbness is not going away after 48 hours, call your surgeon.  2. The extremity that is blocked will need to be protected until the numbness is gone and the  Strength has returned. Because you cannot feel it, you will need to take extra care to avoid injury. Because it may be weak, you may have difficulty moving it or using it. You may not know what position it is in without looking at it while the block is in effect.  3. For blocks in the legs and feet, returning to weight bearing and walking needs to be done carefully. You will need to wait until the numbness is entirely gone and the strength has returned. You should be able to move your leg and foot normally before you try and bear weight or walk. You will need someone to be with you when you first try to ensure you do not fall and possibly risk injury.  4. Bruising and tenderness at the needle site are common side effects and will resolve in a few days.  5. Persistent numbness or new problems with movement should be  communicated to the surgeon or the [facility] [phone] [place] [phone]).

## 2014-12-14 NOTE — Anesthesia Preprocedure Evaluation (Signed)
Anesthesia Evaluation  Patient identified by MRN, date of birth, ID band Patient awake    Reviewed: Allergy & Precautions, NPO status , Patient's Chart, lab work & pertinent test results  Airway Mallampati: I  TM Distance: >3 FB Neck ROM: Full    Dental  (+) Teeth Intact, Dental Advisory Given   Pulmonary sleep apnea and Continuous Positive Airway Pressure Ventilation ,    breath sounds clear to auscultation       Cardiovascular hypertension, Pt. on medications  Rhythm:Regular Rate:Normal     Neuro/Psych    GI/Hepatic GERD  Medicated and Controlled,  Endo/Other    Renal/GU      Musculoskeletal   Abdominal   Peds  Hematology   Anesthesia Other Findings   Reproductive/Obstetrics                             Anesthesia Physical Anesthesia Plan  ASA: II  Anesthesia Plan: General and Regional   Post-op Pain Management:    Induction: Intravenous  Airway Management Planned: LMA  Additional Equipment:   Intra-op Plan:   Post-operative Plan: Extubation in OR  Informed Consent: I have reviewed the patients History and Physical, chart, labs and discussed the procedure including the risks, benefits and alternatives for the proposed anesthesia with the patient or authorized representative who has indicated his/her understanding and acceptance.   Dental advisory given  Plan Discussed with: CRNA, Anesthesiologist and Surgeon  Anesthesia Plan Comments:         Anesthesia Quick Evaluation

## 2014-12-14 NOTE — Anesthesia Postprocedure Evaluation (Signed)
  Anesthesia Post-op Note  Patient: Jenny Bradford  Procedure(s) Performed: Procedure(s): RIGHT THUMB CARPOMETACARPAL (CMC) ARTHROPLASTY (Right)  Patient Location: PACU  Anesthesia Type: General, Regional   Level of Consciousness: awake, alert  and oriented  Airway and Oxygen Therapy: Patient Spontanous Breathing  Post-op Pain: none  Post-op Assessment: Post-op Vital signs reviewed  Post-op Vital Signs: Reviewed  Last Vitals:  Filed Vitals:   12/14/14 1045  BP: 123/66  Pulse: 61  Temp:   Resp: 14    Complications: No apparent anesthesia complications

## 2014-12-15 ENCOUNTER — Encounter (HOSPITAL_BASED_OUTPATIENT_CLINIC_OR_DEPARTMENT_OTHER): Payer: Self-pay | Admitting: Orthopaedic Surgery

## 2015-01-05 ENCOUNTER — Encounter: Payer: Self-pay | Admitting: Occupational Therapy

## 2015-01-05 ENCOUNTER — Ambulatory Visit: Payer: 59 | Attending: Orthopaedic Surgery | Admitting: Occupational Therapy

## 2015-01-05 DIAGNOSIS — M79644 Pain in right finger(s): Secondary | ICD-10-CM | POA: Insufficient documentation

## 2015-01-05 DIAGNOSIS — M25641 Stiffness of right hand, not elsewhere classified: Secondary | ICD-10-CM | POA: Diagnosis present

## 2015-01-05 DIAGNOSIS — M6289 Other specified disorders of muscle: Secondary | ICD-10-CM | POA: Diagnosis present

## 2015-01-05 DIAGNOSIS — R29898 Other symptoms and signs involving the musculoskeletal system: Secondary | ICD-10-CM

## 2015-01-05 NOTE — Therapy (Signed)
[facility] [address] [place], [place], [address] Phone: [phone]   Fax:  [phone]  Occupational Therapy Evaluation  Patient Details  Name: Jenny Bradford MRN: [id number] Date of Birth: 17-Mar-1950 Referring Provider: Dr. Marianna Payment (orthopedist)  Encounter Date: 01/05/2015      OT End of Session - 01/05/15 1515    Visit Number 1   Number of Visits 17   Date for OT Re-Evaluation 03/06/15   Authorization Type [facility]   OT Start Time 1400   OT Stop Time 1500   OT Time Calculation (min) 60 min   Activity Tolerance Patient tolerated treatment well      Past Medical History  Diagnosis Date  . Hypertension 04/01/11    ECHO-EF>55% NUC STRESS TEST- 05/01/12  . Hyperlipidemia   . GERD (gastroesophageal reflux disease)   . H/O hiatal hernia   . Headache(784.0)     migraines  . Arthritis     oa  . Complication of anesthesia     slow to awaken in past  . PONV (postoperative nausea and vomiting)   . Family history of anesthesia complication     slow to awaken  . Sleep apnea 05/01/12 & 05/29/12    SLEEP STUDY-[facility], NO CPAP USED SINCE MAY 2015  . Hypothyroidism     Past Surgical History  Procedure Laterality Date  . [place]  . Foot surgery Right 9.23.14  . Foot surgery Left 10.01.13  . Cystectomy  1975  . Ankle surgery Right 1980  . Hemorrhoid surgery  2005  . Tonsillectomy  1964  . Colonoscopy with propofol N/A 12/07/2013    Procedure: COLONOSCOPY WITH PROPOFOL;  Surgeon: Garlan Fair, MD;  Location: [facility];  Service: Endoscopy;  Laterality: N/A;  . Carpometacarpel suspension plasty Right 12/14/2014    Procedure: RIGHT THUMB CARPOMETACARPAL ([place]) ARTHROPLASTY;  Surgeon: Leandrew Koyanagi, MD;  Location: [facility];  Service: Orthopedics;  Laterality: Right;    There were no vitals filed for this visit.  Visit Diagnosis:  Pain of right thumb -  Plan: Ot plan of care cert/re-cert  Stiffness of right hand joint - Plan: Ot plan of care cert/re-cert  Weakness of right hand - Plan: Ot plan of care cert/re-cert      Subjective Assessment - 01/05/15 1409    Subjective  I started having increasing pain 2 years ago and decreased ability to perform activities like opening jars, holding and lifting pots/pans   Pertinent History see epic snapshot   Patient Stated Goals to get full function in my dominant hand   Currently in Pain? Yes   Pain Score 5    Pain Location Finger (Comment which one)  thumb   Pain Orientation Right   Pain Descriptors / Indicators Aching;Jabbing   Pain Onset 1 to 4 weeks ago   Pain Frequency Constant   Aggravating Factors  overuse   Pain Relieving Factors meds, rest           [facility] OT Assessment - 01/05/15 0001    Assessment   Diagnosis s/p Rt thumb CMC arthoplasty   Referring Provider Dr. Marianna Payment (orthopedist)   Onset Date 12/14/14  surgery date   Assessment Pt has [place]son splint from previous clinic and arrived with on, but reports uncomfortable and doesn't appear to fit very well. Protocol does not call for A/ROM until 4 weeks post op, however MD wants to begin sooner  to prevent stiffness. MD orders specifiy A/ROM and P/ROM   Prior Therapy none   Precautions   Precautions Other (comment)  no    Precaution Comments no strengthening Rt thumb   Required Braces or Orthoses Other Brace/Splint   Other Brace/Splint CMC splint on between exercises   Balance Screen   Has the patient fallen in the past 6 months No   Has the patient had a decrease in activity level because of a fear of falling?  No   Is the patient reluctant to leave their home because of a fear of falling?  No   Home  Environment   Lives With Spouse   Prior Function   Level of Independence Independent with basic ADLs;Needs assistance with homemaking   Vocation Part time employment   Vocation Requirements pharmacist    ADL    ADL comments eating with Rt hand, but has to switch to Lt at times for pain. Uses Lt hand for most grooming. Mod I for dressing except assist for buttons. Mod I for bathing. Dependent for cooking/cleaning at this time.    Mobility   Mobility Status Independent   Written Expression   Dominant Hand Right   Sensation   Additional Comments numbness on inside medial border of thumb    Coordination   9 Hole Peg Test Right;Left   Right 9 Hole Peg Test 26.85 sec.    Left 9 Hole Peg Test 22.22 sec.    ROM / Strength   AROM / PROM / Strength AROM   AROM   Overall AROM Comments Rt wrist flex = 50* (Lt = 60*), Rt wrist ext = 55* (Lt = 65*)   Right Hand AROM   R Thumb MCP 0-60 30 Degrees  Lt = 55   R Thumb Radial ABduction/ADduction 0-55 65  Lt = 80   R Thumb Palmar ABduction/ADduction 0-45 50  Lt = 80   R Thumb Opposition to Index --  thumb opposition to 5th digit                  OT Treatments/Exercises (OP) - 01/05/15 0001    Exercises   Exercises Hand   Hand Exercises   Other Hand Exercises Pt issued A/ROM HEP for wrist and thumb - see pt instructions               OT Education - 01/05/15 1449    Education provided Yes   Education Details HEP for wrist and thumb A/ROM    Person(s) Educated Patient   Methods Explanation;Demonstration;Handout   Comprehension Verbalized understanding;Returned demonstration          OT Short Term Goals - 01/05/15 1521    OT SHORT TERM GOAL #1   Title Independent with HEP - DUE 02/04/15   Time 4   Period Weeks   Status On-going   OT SHORT TERM GOAL #2   Title Independent w/ new CMC splint for greater comfort and fit   Time 4   Period Weeks   Status New   OT SHORT TERM GOAL #3   Title Pt to improve MP flexion Rt thumb by 10 degrees   Baseline 30* (Lt = 55*)   Time 4   Period Weeks   Status New   OT SHORT TERM GOAL #4   Title Pt to improve palmer abduction Rt thumb by 10 degrees or greater   Baseline 50* (Lt = 80)    Time 4   Period Weeks  Status New   OT SHORT TERM GOAL #5   Title Pt to return to eating consistently and grooming with Rt hand   Time 4   Period Weeks   Status New   Additional Short Term Goals   Additional Short Term Goals Yes   OT SHORT TERM GOAL #6   Title Pt to hook buttons independently    Time 4   Period Weeks   Status New           OT Long Term Goals - 01/05/15 1525    OT LONG TERM GOAL #1   Title Independent with updated HEP - DUE 03/06/15   Time 8   Period Weeks   Status New   OT LONG TERM GOAL #2   Title Pain less than or equal to 3/10 with normal daily activities   Time 8   Period Weeks   Status New   OT LONG TERM GOAL #3   Title Pt to verbalize understanding with A/E needs and/or compensations to increase ease and decrease pain with opening medicine bottles, jars, cans and lifting pots/pans, etc.    Time 8   Period Weeks   Status New   OT LONG TERM GOAL #4   Title Pt to return to all IADLS including cooking/cleaning with Rt hand as dominant hand   Time 8   Period Weeks   Status New   OT LONG TERM GOAL #5   Title Grip strength to be 25 lbs or greater for opening various containers   Baseline unable to assess at eval d/t precautions   Time 8   Period Weeks   Status New   Long Term Additional Goals   Additional Long Term Goals Yes   OT LONG TERM GOAL #6   Title Lateral pinch strength to be 12 lbs. or greater for cutting and opening packages    Time 8   Period Weeks   Status New               Plan - 01/05/15 1516    Clinical Impression Statement Pt is a 64 y.o. female who presents to outpatient rehab s/p [facility] arthoplasty Rt thumb on 12/14/14. Pt is now 3 weeks post-op and MD has cleared for A/ROM and P/ROM 2 weeks ahead of protocol.    Pt will benefit from skilled therapeutic intervention in order to improve on the following deficits (Retired) Decreased coordination;Decreased range of motion;Impaired flexibility;Increased edema;Impaired  sensation;Decreased knowledge of precautions;Decreased activity tolerance;Impaired UE functional use;Pain;Decreased mobility;Decreased strength   Rehab Potential Good   OT Frequency 2x / week   OT Duration 8 weeks  plus evaluation   OT Treatment/Interventions Self-care/ADL training;Therapeutic exercise;Patient/family education;Splinting;Manual Therapy;Ultrasound;Parrafin;DME and/or AE instruction;Compression bandaging;Therapeutic activities;Electrical Stimulation;Fluidtherapy;Scar mobilization;Moist Heat;Contrast Bath;Passive range of motion   Plan re-fabricate CMC splint for greater fit and comfort, review A/ROM and issue P/ROM HEP   OT Home Exercise Plan 01/05/15: A/ROM HEP   Consulted and Agree with Plan of Care Patient        Problem List Patient Active Problem List   Diagnosis Date Noted  . OSA on CPAP 10/05/2012  . HTN (hypertension) 10/05/2012  . Hyperlipidemia 10/05/2012  . Migraine headache 10/05/2012    Carey Bullocks, OTR/L 01/05/2015, 3:31 PM  [facility] [address] [place], [place], [address] Phone: [phone]   Fax:  [phone]  Name: LAMONICA TRUEBA MRN: [id number] Date of Birth: [birth year]

## 2015-01-05 NOTE — Patient Instructions (Addendum)
AROM: Wrist Extension   .  With ____ palm down, bend wrist up. Repeat __15__ times per set.  Do __4-6__ sessions per day.    AROM: Wrist Flexion   With_____ palm up, bend wrist up. Repeat __15__ times per set.  Do _4-6___ sessions per day.   Opposition (Active)   Touch tip of thumb to nail tip of each finger in turn, making an "O" shape. Repeat __10__ times. Do _4-6___ sessions per day.   MP Flexion (Active)   Bend thumb to touch base of little finger, keeping tip joint straight. Repeat __10-15__ times. Do _4-6___ sessions per day.       Composite Extension (Active)   Bring thumb up and out in hitchhiker position.  Repeat __10-15__ times. Do _4-6___ sessions per day.

## 2015-01-16 ENCOUNTER — Ambulatory Visit: Payer: 59 | Attending: Orthopaedic Surgery | Admitting: Occupational Therapy

## 2015-01-16 ENCOUNTER — Encounter: Payer: Self-pay | Admitting: Occupational Therapy

## 2015-01-16 DIAGNOSIS — M6289 Other specified disorders of muscle: Secondary | ICD-10-CM | POA: Insufficient documentation

## 2015-01-16 DIAGNOSIS — M79644 Pain in right finger(s): Secondary | ICD-10-CM | POA: Diagnosis present

## 2015-01-16 DIAGNOSIS — M25641 Stiffness of right hand, not elsewhere classified: Secondary | ICD-10-CM | POA: Diagnosis present

## 2015-01-16 NOTE — Patient Instructions (Signed)
Extension (Passive)    Using other hand, lift hand at wrist as far as possible. Hold _10___ seconds. Repeat _5___ times. Do __4-6__ sessions per day.   Wrist Passive Flexion    Rest right forearm on table, hand palm-down over edge. Bend wrist by pressing hand down with other hand. Hold __10__ seconds. Repeat __5__ times. Do __4-6__ sessions per day.   MP Flexion (Passive)    Use other hand to bend base joint of thumb. Hold __10__ seconds. Repeat __5__ times. Do __4-6__ sessions per day.   Abduction: Stretch - Webspace     CAUTION: Do not force movement. Use other hand to gently stretch.  Hold _10__ seconds. Repeat _5__ times.  Do _4-6__ sessions per day.

## 2015-01-16 NOTE — Therapy (Signed)
[facility] [address] [place], [place], [address] Phone: [phone]   Fax:  [phone]  Occupational Therapy Treatment  Patient Details  Name: Jenny Bradford MRN: [id number] Date of Birth: 03/20/1950 Referring Provider: Dr. Marianna Payment (orthopedist)  Encounter Date: 01/16/2015      OT End of Session - 01/16/15 1353    Visit Number 2   Number of Visits 17   Date for OT Re-Evaluation 03/06/15   Authorization Type [facility]   OT Start Time 1150   OT Stop Time 1230   OT Time Calculation (min) 40 min   Activity Tolerance Patient tolerated treatment well      Past Medical History  Diagnosis Date  . Hypertension 04/01/11    ECHO-EF>55% NUC STRESS TEST- 05/01/12  . Hyperlipidemia   . GERD (gastroesophageal reflux disease)   . H/O hiatal hernia   . Headache(784.0)     migraines  . Arthritis     oa  . Complication of anesthesia     slow to awaken in past  . PONV (postoperative nausea and vomiting)   . Family history of anesthesia complication     slow to awaken  . Sleep apnea 05/01/12 & 05/29/12    SLEEP STUDY-[facility], NO CPAP USED SINCE MAY 2015  . Hypothyroidism     Past Surgical History  Procedure Laterality Date  . [place]  . Foot surgery Right 9.23.14  . Foot surgery Left 10.01.13  . Cystectomy  1975  . Ankle surgery Right 1980  . Hemorrhoid surgery  2005  . Tonsillectomy  1964  . Colonoscopy with propofol N/A 12/07/2013    Procedure: COLONOSCOPY WITH PROPOFOL;  Surgeon: Garlan Fair, MD;  [facility];  Service: Endoscopy;  Laterality: N/A;  . Carpometacarpel suspension plasty Right 12/14/2014    Procedure: RIGHT THUMB CARPOMETACARPAL ([place]) ARTHROPLASTY;  Surgeon: Leandrew Koyanagi, MD;  Location: [facility];  Service: Orthopedics;  Laterality: Right;    There were no vitals filed for this visit.  Visit Diagnosis:  Pain of right  thumb  Stiffness of right hand joint      Subjective Assessment - 01/16/15 1154    Subjective  My pain was about a 5/10 but my pain meds have kicked in   Pertinent History see epic snapshot   Patient Stated Goals to get full function in my dominant hand   Currently in Pain? Yes   Pain Score 3    Pain Location Finger (Comment which one)  THUMB   Pain Orientation Right   Pain Descriptors / Indicators Aching;Jabbing   Pain Type Chronic pain   Pain Onset More than a month ago   Pain Frequency Constant   Aggravating Factors  overuse   Pain Relieving Factors meds, rest                      OT Treatments/Exercises (OP) - 01/16/15 0001    Hand Exercises   Other Hand Exercises Reviewed A/ROM HEP for thumb/wrist previously issued. Issued P/ROM HEP for thumb/wrist - see pt instructions for details   Splinting   Splinting Fabricated and fitted new CMC splint for greater comfort and fit around thumb for increased support. Issued splint                OT Education - 01/16/15 1209    Education provided Yes   Education Details P/ROM HEP for Rt thumb  and wrist   Person(s) Educated Patient   Methods Explanation;Demonstration;Handout   Comprehension Verbalized understanding;Returned demonstration          OT Short Term Goals - 01/05/15 1521    OT SHORT TERM GOAL #1   Title Independent with HEP - DUE 02/04/15   Time 4   Period Weeks   Status On-going   OT SHORT TERM GOAL #2   Title Independent w/ new CMC splint for greater comfort and fit   Time 4   Period Weeks   Status New   OT SHORT TERM GOAL #3   Title Pt to improve MP flexion Rt thumb by 10 degrees   Baseline 30* (Lt = 55*)   Time 4   Period Weeks   Status New   OT SHORT TERM GOAL #4   Title Pt to improve palmer abduction Rt thumb by 10 degrees or greater   Baseline 50* (Lt = 80)   Time 4   Period Weeks   Status New   OT SHORT TERM GOAL #5   Title Pt to return to eating consistently and  grooming with Rt hand   Time 4   Period Weeks   Status New   Additional Short Term Goals   Additional Short Term Goals Yes   OT SHORT TERM GOAL #6   Title Pt to hook buttons independently    Time 4   Period Weeks   Status New           OT Long Term Goals - 01/05/15 1525    OT LONG TERM GOAL #1   Title Independent with updated HEP - DUE 03/06/15   Time 8   Period Weeks   Status New   OT LONG TERM GOAL #2   Title Pain less than or equal to 3/10 with normal daily activities   Time 8   Period Weeks   Status New   OT LONG TERM GOAL #3   Title Pt to verbalize understanding with A/E needs and/or compensations to increase ease and decrease pain with opening medicine bottles, jars, cans and lifting pots/pans, etc.    Time 8   Period Weeks   Status New   OT LONG TERM GOAL #4   Title Pt to return to all IADLS including cooking/cleaning with Rt hand as dominant hand   Time 8   Period Weeks   Status New   OT LONG TERM GOAL #5   Title Grip strength to be 25 lbs or greater for opening various containers   Baseline unable to assess at eval d/t precautions   Time 8   Period Weeks   Status New   Long Term Additional Goals   Additional Long Term Goals Yes   OT LONG TERM GOAL #6   Title Lateral pinch strength to be 12 lbs. or greater for cutting and opening packages    Time 8   Period Weeks   Status New               Plan - 01/16/15 1353    Clinical Impression Statement Pt approximating STG's and progressing with ROM but limited by pain   Plan fluidotherapy, assess new splint, continue A/ROM and P/ROM    OT Home Exercise Plan 01/05/15: A/ROM HEP. 01/16/15: P/ROM HEP    Consulted and Agree with Plan of Care Patient        Problem List Patient Active Problem List   Diagnosis Date Noted  . OSA on CPAP  10/05/2012  . HTN (hypertension) 10/05/2012  . Hyperlipidemia 10/05/2012  . Migraine headache 10/05/2012    Carey Bullocks, OTR/L 01/16/2015, 1:55  PM  [facility] [address] [place], [place], [address] Phone: [phone]   Fax:  [phone]  Name: ZAELYNN FUCHS MRN: [id number] Date of Birth: September 04, 1950

## 2015-01-18 ENCOUNTER — Ambulatory Visit: Payer: 59 | Admitting: Occupational Therapy

## 2015-01-18 ENCOUNTER — Encounter: Payer: Self-pay | Admitting: Occupational Therapy

## 2015-01-18 DIAGNOSIS — M25641 Stiffness of right hand, not elsewhere classified: Secondary | ICD-10-CM

## 2015-01-18 DIAGNOSIS — M79644 Pain in right finger(s): Secondary | ICD-10-CM

## 2015-01-18 NOTE — Therapy (Signed)
[facility] [address] [place], [place], [address] Phone: [phone]   Fax:  [phone]  Occupational Therapy Treatment  Patient Details  Name: Jenny Bradford MRN: [id number] Date of Birth: 10/24/1950 Referring Provider: Dr. Marianna Payment (orthopedist)  Encounter Date: 01/18/2015      OT End of Session - 01/18/15 0948    Visit Number 3   Number of Visits 17   Date for OT Re-Evaluation 03/06/15   Authorization Type [facility]   OT Start Time 0848   OT Stop Time 0930   OT Time Calculation (min) 42 min   Activity Tolerance Patient tolerated treatment well      Past Medical History  Diagnosis Date  . Hypertension 04/01/11    ECHO-EF>55% NUC STRESS TEST- 05/01/12  . Hyperlipidemia   . GERD (gastroesophageal reflux disease)   . H/O hiatal hernia   . Headache(784.0)     migraines  . Arthritis     oa  . Complication of anesthesia     slow to awaken in past  . PONV (postoperative nausea and vomiting)   . Family history of anesthesia complication     slow to awaken  . Sleep apnea 05/01/12 & 05/29/12    SLEEP STUDY-[facility], NO CPAP USED SINCE MAY 2015  . Hypothyroidism     Past Surgical History  Procedure Laterality Date  . [place]  . Foot surgery Right 9.23.14  . Foot surgery Left 10.01.13  . Cystectomy  1975  . Ankle surgery Right 1980  . Hemorrhoid surgery  2005  . Tonsillectomy  1964  . Colonoscopy with propofol N/A 12/07/2013    Procedure: COLONOSCOPY WITH PROPOFOL;  Surgeon: Garlan Fair, MD;  Location: [facility];  Service: Endoscopy;  Laterality: N/A;  . Carpometacarpel suspension plasty Right 12/14/2014    Procedure: RIGHT THUMB CARPOMETACARPAL ([place]) ARTHROPLASTY;  Surgeon: Leandrew Koyanagi, MD;  Location: [facility];  Service: Orthopedics;  Laterality: Right;    There were no vitals filed for this visit.  Visit Diagnosis:  Pain of right  thumb  Stiffness of right hand joint      Subjective Assessment - 01/18/15 0906    Pertinent History see epic snapshot   Patient Stated Goals to get full function in my dominant hand   Currently in Pain? Yes   Pain Score 3    Pain Location Finger (Comment which one)  THUMB   Pain Orientation Right   Pain Descriptors / Indicators Aching;Sore   Pain Type Chronic pain   Pain Onset More than a month ago   Pain Frequency Constant   Aggravating Factors  overuse   Pain Relieving Factors meds, rest                      OT Treatments/Exercises (OP) - 01/18/15 0001    ADLs   ADL Comments Pt instructed in scar massage and desensitization techniques - see pt instructions for details   Hand Exercises   Other Hand Exercises Reviewed P/ROM HEP issued last session per pt request. Pt required min v.c's to perform correctly and to prevent ulnar deviation with wrist ex's   Modalities   Modalities Fluidotherapy   RUE Fluidotherapy   Number Minutes Fluidotherapy 12 Minutes   RUE Fluidotherapy Location Hand;Wrist   Comments At beginning of session to decrease pain/stiffness   Splinting   Splinting Added moleskin to web space of splint to increase  comfort                OT Education - 01/18/15 [id number]    Education provided Yes   Education Details scar massage, desensitization techniques   Person(s) Educated Patient   Methods Explanation;Demonstration;Handout          OT Short Term Goals - 01/05/15 1521    OT SHORT TERM GOAL #1   Title Independent with HEP - DUE 02/04/15   Time 4   Period Weeks   Status On-going   OT SHORT TERM GOAL #2   Title Independent w/ new CMC splint for greater comfort and fit   Time 4   Period Weeks   Status New   OT SHORT TERM GOAL #3   Title Pt to improve MP flexion Rt thumb by 10 degrees   Baseline 30* (Lt = 55*)   Time 4   Period Weeks   Status New   OT SHORT TERM GOAL #4   Title Pt to improve palmer abduction Rt thumb by 10  degrees or greater   Baseline 50* (Lt = 80)   Time 4   Period Weeks   Status New   OT SHORT TERM GOAL #5   Title Pt to return to eating consistently and grooming with Rt hand   Time 4   Period Weeks   Status New   Additional Short Term Goals   Additional Short Term Goals Yes   OT SHORT TERM GOAL #6   Title Pt to hook buttons independently    Time 4   Period Weeks   Status New           OT Long Term Goals - 01/05/15 1525    OT LONG TERM GOAL #1   Title Independent with updated HEP - DUE 03/06/15   Time 8   Period Weeks   Status New   OT LONG TERM GOAL #2   Title Pain less than or equal to 3/10 with normal daily activities   Time 8   Period Weeks   Status New   OT LONG TERM GOAL #3   Title Pt to verbalize understanding with A/E needs and/or compensations to increase ease and decrease pain with opening medicine bottles, jars, cans and lifting pots/pans, etc.    Time 8   Period Weeks   Status New   OT LONG TERM GOAL #4   Title Pt to return to all IADLS including cooking/cleaning with Rt hand as dominant hand   Time 8   Period Weeks   Status New   OT LONG TERM GOAL #5   Title Grip strength to be 25 lbs or greater for opening various containers   Baseline unable to assess at eval d/t precautions   Time 8   Period Weeks   Status New   Long Term Additional Goals   Additional Long Term Goals Yes   OT LONG TERM GOAL #6   Title Lateral pinch strength to be 12 lbs. or greater for cutting and opening packages    Time 8   Period Weeks   Status New               Plan - 01/18/15 0949    Clinical Impression Statement Pt progressing towards STG's and protocol, but does require review of HEP's to perform correctly. Pt is now 5 weeks post-op   Plan continue fluido, A/ROM and PR/OM    OT Home Exercise Plan 01/05/15: A/ROM HEP. 01/16/15: P/ROM HEP.  01/18/15: scar massage and desensitization techniques   Consulted and Agree with Plan of Care Patient        Problem  List Patient Active Problem List   Diagnosis Date Noted  . OSA on CPAP 10/05/2012  . HTN (hypertension) 10/05/2012  . Hyperlipidemia 10/05/2012  . Migraine headache 10/05/2012    Jenny Bradford, Jenny Bradford 01/18/2015, 9:51 AM  [facility] [address] [place], [place], [address] Phone: [phone]   Fax:  [phone]  Name: Jenny Bradford MRN: [id number] Date of Birth: 12/08/1950

## 2015-01-18 NOTE — Patient Instructions (Signed)
  SCAR MASSAGE:   Use cocoa butter or Vitamin E cream and perform deep small circles along incision (or along the length of the incision,  and against the grain of the incision in a "cross" pattern) for 5 minutes 2x/day   Desensitization Techniques  Perform these exercises ever 2 hours for 15 minute sessions.  Progress to the next exercises when the exercises you are doing become easy.  1)  Using light pressure, rub the various textures along with the hypersensitive area:  A.  [place]  C.  Wool  G.  Cotton material  D.  Terry cloth  2)  With the same textures use a firmer pressure. 3)  Use a hand held vibrator and massage along the sensitive area. 4)  With a small dowel rod, eraser on a pencil or base of an ink pen tap along the sensitive area. 5)  Use an empty roll-on deodorant bottle to roll along the sensitive area. 6)  Place your hand/forearm in separate containers of the following items:  A.  Sand  D.  Dry lentil beans  B.  Dry Rice  E.  Dry kidney beans  C.  Ball bearings F.  Dry pinto beans  Scar Massage Purpose: To soften/smooth scar tissue.   To desensitize sensitive areas after surgery.   To mechanically break up inner scar tissue, adhesions, therefore allowing freer        movement of injured tendons and muscle.  Technique: Use a cream to massage with, as it insures a smooth gliding motion and avoids irritation  caused by rubbing skin to skin.  Cream is preferred over a lotion.   May begin as soon as any suture areas are healed.   Apply a firm, steady pressure with your finger-tip, pulling the skin in a circular motion over the scarred area.  Do not rub.  Message 5 minutes, at least 2 times a day, unless you are getting tender afterwards

## 2015-01-24 ENCOUNTER — Encounter: Payer: Self-pay | Admitting: Occupational Therapy

## 2015-01-24 ENCOUNTER — Ambulatory Visit: Payer: 59 | Admitting: Occupational Therapy

## 2015-01-24 DIAGNOSIS — M25641 Stiffness of right hand, not elsewhere classified: Secondary | ICD-10-CM

## 2015-01-24 DIAGNOSIS — M79644 Pain in right finger(s): Secondary | ICD-10-CM

## 2015-01-24 NOTE — Therapy (Signed)
[facility] [address] [place], [place], [address] Phone: [phone]   Fax:  [phone]  Occupational Therapy Treatment  Patient Details  Name: Jenny Bradford MRN: [id number] Date of Birth: 03/31/1950 Referring Provider: Dr. Marianna Payment (orthopedist)  Encounter Date: 01/24/2015      OT End of Session - 01/24/15 1310    Visit Number 4   Number of Visits 17   Date for OT Re-Evaluation 03/06/15   Authorization Type [facility]   OT Start Time 0930   OT Stop Time 1015   OT Time Calculation (min) 45 min   Equipment Utilized During Treatment FLUIDOTHERAPY   Activity Tolerance Patient tolerated treatment well      Past Medical History  Diagnosis Date  . Hypertension 04/01/11    ECHO-EF>55% NUC STRESS TEST- 05/01/12  . Hyperlipidemia   . GERD (gastroesophageal reflux disease)   . H/O hiatal hernia   . Headache(784.0)     migraines  . Arthritis     oa  . Complication of anesthesia     slow to awaken in past  . PONV (postoperative nausea and vomiting)   . Family history of anesthesia complication     slow to awaken  . Sleep apnea 05/01/12 & 05/29/12    SLEEP STUDY-[facility], NO CPAP USED SINCE MAY 2015  . Hypothyroidism     Past Surgical History  Procedure Laterality Date  . [place]  . Foot surgery Right 9.23.14  . Foot surgery Left 10.01.13  . Cystectomy  1975  . Ankle surgery Right 1980  . Hemorrhoid surgery  2005  . Tonsillectomy  1964  . Colonoscopy with propofol N/A 12/07/2013    Procedure: COLONOSCOPY WITH PROPOFOL;  Surgeon: Garlan Fair, MD;  [facility];  Service: Endoscopy;  Laterality: N/A;  . Carpometacarpel suspension plasty Right 12/14/2014    Procedure: RIGHT THUMB CARPOMETACARPAL ([place]) ARTHROPLASTY;  Surgeon: Leandrew Koyanagi, MD;  Location: [facility];  Service: Orthopedics;  Laterality: Right;    There were no vitals filed for  this visit.  Visit Diagnosis:  Pain of right thumb  Stiffness of right hand joint      Subjective Assessment - 01/24/15 0937    Subjective  The only pain medicine I took this morning was tylenol   Pertinent History see epic snapshot   Patient Stated Goals to get full function in my dominant hand   Currently in Pain? Yes   Pain Score 3    Pain Location --  Thumb   Pain Orientation Right   Pain Descriptors / Indicators Aching;Sore   Pain Type Chronic pain   Pain Onset More than a month ago   Pain Frequency Constant   Aggravating Factors  overuse   Pain Relieving Factors meds, heat, rest                      OT Treatments/Exercises (OP) - 01/24/15 0001    ADLs   ADL Comments Pt provided arthritis booklet: reviewed joint protection techniques and adapting tasks to prevent ulnar deviation at wrist and MP joints to prevent/reduce pain   Hand Exercises   Other Hand Exercises Forearm gym x 3 reps for wrist ROM    Other Hand Exercises Radial deviation x 20 reps in A/ROM secondary to pt ulnarly deviating and very limited in radial deviation ROM.    RUE Fluidotherapy   Number Minutes Fluidotherapy 12 Minutes  RUE Fluidotherapy Location Hand;Wrist   Comments at beginning of session to decrease pain/stiffness   Manual Therapy   Manual Therapy Soft tissue mobilization;Passive ROM   Soft tissue mobilization along radial base of thumb   Passive ROM at thumb in MP flexion, radial and palmer abduction, followed by P/ROM in radial deviation                  OT Short Term Goals - 01/24/15 1312    OT SHORT TERM GOAL #1   Title Independent with HEP - DUE 02/04/15   Time 4   Period Weeks   Status Achieved   OT SHORT TERM GOAL #2   Title Independent w/ new CMC splint for greater comfort and fit   Time 4   Period Weeks   Status Achieved   OT SHORT TERM GOAL #3   Title Pt to improve MP flexion Rt thumb by 10 degrees   Baseline 30* (Lt = 55*)   Time 4   Period  Weeks   Status New   OT SHORT TERM GOAL #4   Title Pt to improve palmer abduction Rt thumb by 10 degrees or greater   Baseline 50* (Lt = 80)   Time 4   Period Weeks   Status New   OT SHORT TERM GOAL #5   Title Pt to return to eating consistently and grooming with Rt hand   Time 4   Period Weeks   Status New   OT SHORT TERM GOAL #6   Title Pt to hook buttons independently    Time 4   Period Weeks   Status New           OT Long Term Goals - 01/05/15 1525    OT LONG TERM GOAL #1   Title Independent with updated HEP - DUE 03/06/15   Time 8   Period Weeks   Status New   OT LONG TERM GOAL #2   Title Pain less than or equal to 3/10 with normal daily activities   Time 8   Period Weeks   Status New   OT LONG TERM GOAL #3   Title Pt to verbalize understanding with A/E needs and/or compensations to increase ease and decrease pain with opening medicine bottles, jars, cans and lifting pots/pans, etc.    Time 8   Period Weeks   Status New   OT LONG TERM GOAL #4   Title Pt to return to all IADLS including cooking/cleaning with Rt hand as dominant hand   Time 8   Period Weeks   Status New   OT LONG TERM GOAL #5   Title Grip strength to be 25 lbs or greater for opening various containers   Baseline unable to assess at eval d/t precautions   Time 8   Period Weeks   Status New   Long Term Additional Goals   Additional Long Term Goals Yes   OT LONG TERM GOAL #6   Title Lateral pinch strength to be 12 lbs. or greater for cutting and opening packages    Time 8   Period Weeks   Status New               Plan - 01/24/15 1312    Clinical Impression Statement Pt met STG's #1 and #2. Pt progressing with exercises and reports alternating b/t the 2 splints.    Plan Fluido vs. paraffin, review arthritis booklet prn, work towards remaining [place]  Exercise Plan 01/05/15: A/ROM HEP. 01/16/15: P/ROM HEP. 01/18/15: scar massage and desensitization techniques   Consulted and  Agree with Plan of Care Patient        Problem List Patient Active Problem List   Diagnosis Date Noted  . OSA on CPAP 10/05/2012  . HTN (hypertension) 10/05/2012  . Hyperlipidemia 10/05/2012  . Migraine headache 10/05/2012    Carey Bullocks, OTR/L 01/24/2015, 2:33 PM  [facility] [address] [place], [place], [address] Phone: [phone]   Fax:  [phone]  Name: SKARLETT SEDLACEK MRN: [id number] Date of Birth: 1950/07/13

## 2015-01-26 ENCOUNTER — Ambulatory Visit: Payer: 59 | Admitting: Occupational Therapy

## 2015-01-26 ENCOUNTER — Encounter: Payer: Self-pay | Admitting: Occupational Therapy

## 2015-01-26 DIAGNOSIS — M79644 Pain in right finger(s): Secondary | ICD-10-CM

## 2015-01-26 DIAGNOSIS — M25641 Stiffness of right hand, not elsewhere classified: Secondary | ICD-10-CM

## 2015-01-26 NOTE — Therapy (Signed)
[facility] [address] [place], [place], [address] Phone: [phone]   Fax:  [phone]  Occupational Therapy Treatment  Patient Details  Name: MARYLYNN RIGDON MRN: [id number] Date of Birth: 02-25-1951 Referring Provider: Dr. Marianna Payment (orthopedist)  Encounter Date: 01/26/2015      OT End of Session - 01/26/15 1059    Visit Number 5   Number of Visits 17   Date for OT Re-Evaluation 03/06/15   Authorization Type [facility]   OT Start Time 0845   OT Stop Time 0930   OT Time Calculation (min) 45 min   Equipment Utilized During Treatment FLUIDOTHERAPY   Activity Tolerance Patient tolerated treatment well      Past Medical History  Diagnosis Date  . Hypertension 04/01/11    ECHO-EF>55% NUC STRESS TEST- 05/01/12  . Hyperlipidemia   . GERD (gastroesophageal reflux disease)   . H/O hiatal hernia   . Headache(784.0)     migraines  . Arthritis     oa  . Complication of anesthesia     slow to awaken in past  . PONV (postoperative nausea and vomiting)   . Family history of anesthesia complication     slow to awaken  . Sleep apnea 05/01/12 & 05/29/12    SLEEP STUDY-[facility], NO CPAP USED SINCE MAY 2015  . Hypothyroidism     Past Surgical History  Procedure Laterality Date  . [place]  . Foot surgery Right 9.23.14  . Foot surgery Left 10.01.13  . Cystectomy  1975  . Ankle surgery Right 1980  . Hemorrhoid surgery  2005  . Tonsillectomy  1964  . Colonoscopy with propofol N/A 12/07/2013    Procedure: COLONOSCOPY WITH PROPOFOL;  Surgeon: Garlan Fair, MD;  [facility];  Service: Endoscopy;  Laterality: N/A;  . Carpometacarpel suspension plasty Right 12/14/2014    Procedure: RIGHT THUMB CARPOMETACARPAL ([place]) ARTHROPLASTY;  Surgeon: Leandrew Koyanagi, MD;  Location: [facility];  Service: Orthopedics;  Laterality: Right;    There were no vitals filed for  this visit.  Visit Diagnosis:  Pain of right thumb  Stiffness of right hand joint      Subjective Assessment - 01/26/15 0906    Pertinent History see epic snapshot   Patient Stated Goals to get full function in my dominant hand   Currently in Pain? Yes   Pain Score 2    Pain Location Finger (Comment which one)  THUMB   Pain Orientation Right   Pain Descriptors / Indicators Aching;Sore   Pain Type Chronic pain   Pain Onset More than a month ago   Pain Frequency Constant   Aggravating Factors  Overuse   Pain Relieving Factors meds, heat, rest                      OT Treatments/Exercises (OP) - 01/26/15 0001    ADLs   UB Dressing Practiced hooking/unhooking buttons on shirt without difficulty   ADL Comments Assessed remaining STG's and progress to date - see STG's for details   RUE Fluidotherapy   Number Minutes Fluidotherapy 12 Minutes   RUE Fluidotherapy Location Hand;Wrist   Comments at beginning of session to decrease pain/stiffness   Manual Therapy   Manual Therapy Soft tissue mobilization;Passive ROM   Soft tissue mobilization along radial base of thumb   Passive ROM at thumb in MP flexion, radial and palmer abduction, followed by P/ROM  in radial deviation                  OT Short Term Goals - 01/26/15 0914    OT SHORT TERM GOAL #1   Title Independent with HEP - DUE 02/04/15   Time 4   Period Weeks   Status Achieved   OT SHORT TERM GOAL #2   Title Independent w/ new CMC splint for greater comfort and fit   Time 4   Period Weeks   Status Achieved   OT SHORT TERM GOAL #3   Title Pt to improve MP flexion Rt thumb by 10 degrees   Baseline 30* (Lt = 55*)   Time 4   Period Weeks   Status On-going   OT SHORT TERM GOAL #4   Title Pt to improve palmer abduction Rt thumb by 10 degrees or greater   Baseline 50* (Lt = 80)   Time 4   Period Weeks   Status Achieved  01/26/15: Rt = 60*   OT SHORT TERM GOAL #5   Title Pt to return to eating  consistently and grooming with Rt hand   Time 4   Period Weeks   Status Achieved  per pt report   OT SHORT TERM GOAL #6   Title Pt to hook buttons independently    Time 4   Period Weeks   Status Partially Met  can do shirts, sometimes requires assist with pants buttons           OT Long Term Goals - 01/05/15 1525    OT LONG TERM GOAL #1   Title Independent with updated HEP - DUE 03/06/15   Time 8   Period Weeks   Status New   OT LONG TERM GOAL #2   Title Pain less than or equal to 3/10 with normal daily activities   Time 8   Period Weeks   Status New   OT LONG TERM GOAL #3   Title Pt to verbalize understanding with A/E needs and/or compensations to increase ease and decrease pain with opening medicine bottles, jars, cans and lifting pots/pans, etc.    Time 8   Period Weeks   Status New   OT LONG TERM GOAL #4   Title Pt to return to all IADLS including cooking/cleaning with Rt hand as dominant hand   Time 8   Period Weeks   Status New   OT LONG TERM GOAL #5   Title Grip strength to be 25 lbs or greater for opening various containers   Baseline unable to assess at eval d/t precautions   Time 8   Period Weeks   Status New   Long Term Additional Goals   Additional Long Term Goals Yes   OT LONG TERM GOAL #6   Title Lateral pinch strength to be 12 lbs. or greater for cutting and opening packages    Time 8   Period Weeks   Status New               Plan - 01/26/15 1101    Clinical Impression Statement Pt met all STG's except STG #3 with MP flexion ROM same as initial evaluation. However palmer abduction has improved. Pt is now 6 weeks post-op.    Plan fluido vs. paraffin, begin light strengthening with putty   OT Home Exercise Plan 01/05/15: A/ROM HEP. 01/16/15: P/ROM HEP. 01/18/15: scar massage and desensitization techniques   Consulted and Agree with Plan of Care Patient  Problem List Patient Active Problem List   Diagnosis Date Noted  . OSA on  CPAP 10/05/2012  . HTN (hypertension) 10/05/2012  . Hyperlipidemia 10/05/2012  . Migraine headache 10/05/2012    Carey Bullocks, OTR/L 01/26/2015, 12:05 PM  [facility] [address] [place], [place], [address] Phone: [phone]   Fax:  [phone]  Name: WILLIS KUIPERS MRN: [id number] Date of Birth: Oct 19, 1950

## 2015-01-30 ENCOUNTER — Ambulatory Visit: Payer: 59 | Admitting: Occupational Therapy

## 2015-01-30 ENCOUNTER — Encounter: Payer: Self-pay | Admitting: Occupational Therapy

## 2015-01-30 DIAGNOSIS — M25641 Stiffness of right hand, not elsewhere classified: Secondary | ICD-10-CM

## 2015-01-30 DIAGNOSIS — M79644 Pain in right finger(s): Secondary | ICD-10-CM | POA: Diagnosis not present

## 2015-01-30 DIAGNOSIS — R29898 Other symptoms and signs involving the musculoskeletal system: Secondary | ICD-10-CM

## 2015-01-30 NOTE — Patient Instructions (Signed)
1. Grip Strengthening (Resistive Putty)   Squeeze putty using thumb and all fingers. Repeat _15-20___ times. Do __2__ sessions per day.    2. Roll putty into tube on table and pinch between index finger and thumb, then between long finger and thumb  x 10 reps each. 2x/day   3. Lateral Pinch Strengthening (Resistive Putty)    Squeeze between thumb and side of index finger by pushing putty down towards index finger with thumb Repeat _10___ times. Do __2__ sessions per day.

## 2015-01-30 NOTE — Therapy (Signed)
[facility] [address] [place], [place], [address] Phone: [phone]   Fax:  [phone]  Occupational Therapy Treatment  Patient Details  Name: Jenny Bradford MRN: [id number] Date of Birth: [birth year] Referring Provider: Dr. Marianna Payment (orthopedist)  Encounter Date: 01/30/2015      OT End of Session - 01/30/15 1229    Visit Number 6   Number of Visits 17   Date for OT Re-Evaluation 03/06/15   Authorization Type [facility]   OT Start Time 1145   OT Stop Time 1225   OT Time Calculation (min) 40 min   Equipment Utilized During Treatment Paraffin   Activity Tolerance Patient tolerated treatment well      Past Medical History  Diagnosis Date  . Hypertension 04/01/11    ECHO-EF>55% NUC STRESS TEST- 05/01/12  . Hyperlipidemia   . GERD (gastroesophageal reflux disease)   . H/O hiatal hernia   . Headache(784.0)     migraines  . Arthritis     oa  . Complication of anesthesia     slow to awaken in past  . PONV (postoperative nausea and vomiting)   . Family history of anesthesia complication     slow to awaken  . Sleep apnea 05/01/12 & 05/29/12    SLEEP STUDY-[facility] [facility] SLEEP, NO CPAP USED SINCE MAY 2015  . Hypothyroidism     Past Surgical History  Procedure Laterality Date  . [place]  . Foot surgery Right 9.23.14  . Foot surgery Left 10.01.13  . Cystectomy  1975  . Ankle surgery Right 1980  . Hemorrhoid surgery  2005  . Tonsillectomy  1964  . Colonoscopy with propofol N/A 12/07/2013    Procedure: COLONOSCOPY WITH PROPOFOL;  Surgeon: Garlan Fair, MD;  Location: [facility];  Service: Endoscopy;  Laterality: N/A;  . Carpometacarpel suspension plasty Right 12/14/2014    Procedure: RIGHT THUMB CARPOMETACARPAL ([place]) ARTHROPLASTY;  Surgeon: Leandrew Koyanagi, MD;  Location: [facility];  Service: Orthopedics;  Laterality: Right;    There were no vitals filed for this  visit.  Visit Diagnosis:  Stiffness of right hand joint  Weakness of right hand  Pain of right thumb      Subjective Assessment - 01/30/15 1149    Subjective  The pain never goes away 100%   Pertinent History see epic snapshot   Patient Stated Goals to get full function in my dominant hand   Currently in Pain? Yes   Pain Score 2    Pain Location Finger (Comment which one)  thumb   Pain Orientation Right   Pain Descriptors / Indicators Aching;Sore   Pain Type Chronic pain   Pain Onset More than a month ago   Pain Frequency Constant   Aggravating Factors  overuse   Pain Relieving Factors meds, heat, rest            [facility] OT Assessment - 01/30/15 0001    Hand Function   Right Hand Grip (lbs) 15 lbs   Right Hand Lateral Pinch 7 lbs   Left Hand Grip (lbs) 44 lbs   Left Hand Lateral Pinch 13 lbs                  OT Treatments/Exercises (OP) - 01/30/15 0001    ADLs   ADL Comments Discussed modifications in kitchen to avoid pain - putting heavier items in lower cabinets, and lighter items in higher cabinets; using smaller baking  dishes (2 small vs. 1 large pan), etc.    Hand Exercises   Other Hand Exercises Putty HEP issued with yellow resistance putty. Pt performed each 10-15 reps - see pt instructions   Other Hand Exercises Pt instructed to avoid ulnar deviation and pt instructed to do radial deviation ex's at home as well as holding 2 lb. dumbbell with wrist neutral for isometric strengthening (while preventing ulnar deviation!)    Modalities   Modalities Paraffin   RUE Paraffin   Number Minutes Paraffin 12 Minutes   RUE Paraffin Location Hand  and wrist   Comments at beginning of session to decrease pain/stiffness                OT Education - 01/30/15 1210    Education provided Yes   Education Details yellow putty HEP   Person(s) Educated Patient   Methods Explanation;Demonstration;Handout   Comprehension Verbalized understanding;Returned  demonstration          OT Short Term Goals - 01/26/15 0914    OT SHORT TERM GOAL #1   Title Independent with HEP - DUE 02/04/15   Time 4   Period Weeks   Status Achieved   OT SHORT TERM GOAL #2   Title Independent w/ new CMC splint for greater comfort and fit   Time 4   Period Weeks   Status Achieved   OT SHORT TERM GOAL #3   Title Pt to improve MP flexion Rt thumb by 10 degrees   Baseline 30* (Lt = 55*)   Time 4   Period Weeks   Status On-going   OT SHORT TERM GOAL #4   Title Pt to improve palmer abduction Rt thumb by 10 degrees or greater   Baseline 50* (Lt = 80)   Time 4   Period Weeks   Status Achieved  01/26/15: Rt = 60*   OT SHORT TERM GOAL #5   Title Pt to return to eating consistently and grooming with Rt hand   Time 4   Period Weeks   Status Achieved  per pt report   OT SHORT TERM GOAL #6   Title Pt to hook buttons independently    Time 4   Period Weeks   Status Partially Met  can do shirts, sometimes requires assist with pants buttons           OT Long Term Goals - 01/05/15 1525    OT LONG TERM GOAL #1   Title Independent with updated HEP - DUE 03/06/15   Time 8   Period Weeks   Status New   OT LONG TERM GOAL #2   Title Pain less than or equal to 3/10 with normal daily activities   Time 8   Period Weeks   Status New   OT LONG TERM GOAL #3   Title Pt to verbalize understanding with A/E needs and/or compensations to increase ease and decrease pain with opening medicine bottles, jars, cans and lifting pots/pans, etc.    Time 8   Period Weeks   Status New   OT LONG TERM GOAL #4   Title Pt to return to all IADLS including cooking/cleaning with Rt hand as dominant hand   Time 8   Period Weeks   Status New   OT LONG TERM GOAL #5   Title Grip strength to be 25 lbs or greater for opening various containers   Baseline unable to assess at eval d/t precautions   Time 8   Period  Weeks   Status New   Long Term Additional Goals   Additional Long  Term Goals Yes   OT LONG TERM GOAL #6   Title Lateral pinch strength to be 12 lbs. or greater for cutting and opening packages    Time 8   Period Weeks   Status New               Plan - 01/30/15 1229    Clinical Impression Statement Pt tolerating light strengthening today.    Plan continue light strengthening and ROM, Fluido or paraffin   OT Home Exercise Plan 01/05/15: A/ROM HEP. 01/16/15: P/ROM HEP. 01/18/15: scar massage and desensitization techniques   Consulted and Agree with Plan of Care Patient        Problem List Patient Active Problem List   Diagnosis Date Noted  . OSA on CPAP 10/05/2012  . HTN (hypertension) 10/05/2012  . Hyperlipidemia 10/05/2012  . Migraine headache 10/05/2012    Carey Bullocks, OTR/L 01/30/2015, 12:30 PM  [facility] [address] [place], [place], [address] Phone: [phone]   Fax:  [phone]  Name: Jenny Bradford MRN: [id number] Date of Birth: 09-18-1950

## 2015-02-06 ENCOUNTER — Ambulatory Visit: Payer: 59 | Admitting: Occupational Therapy

## 2015-02-06 ENCOUNTER — Encounter: Payer: Self-pay | Admitting: Occupational Therapy

## 2015-02-06 DIAGNOSIS — R29898 Other symptoms and signs involving the musculoskeletal system: Secondary | ICD-10-CM

## 2015-02-06 DIAGNOSIS — M79644 Pain in right finger(s): Secondary | ICD-10-CM

## 2015-02-06 DIAGNOSIS — M25641 Stiffness of right hand, not elsewhere classified: Secondary | ICD-10-CM

## 2015-02-06 NOTE — Therapy (Signed)
[facility] [address] [place], [place], [address] Phone: [phone]   Fax:  [phone]  Occupational Therapy Treatment  Patient Details  Name: Jenny Bradford MRN: [id number] Date of Birth: [birth year] Referring Provider: Dr. Marianna Payment (orthopedist)  Encounter Date: 02/06/2015      OT End of Session - 02/06/15 1148    Visit Number 7   Number of Visits 17   Date for OT Re-Evaluation 03/06/15   Authorization Type [facility]   OT Start Time 1100   OT Stop Time 1145   OT Time Calculation (min) 45 min   Equipment Utilized During Treatment Paraffin   Activity Tolerance Patient tolerated treatment well      Past Medical History  Diagnosis Date  . Hypertension 04/01/11    ECHO-EF>55% NUC STRESS TEST- 05/01/12  . Hyperlipidemia   . GERD (gastroesophageal reflux disease)   . H/O hiatal hernia   . Headache(784.0)     migraines  . Arthritis     oa  . Complication of anesthesia     slow to awaken in past  . PONV (postoperative nausea and vomiting)   . Family history of anesthesia complication     slow to awaken  . Sleep apnea 05/01/12 & 05/29/12    SLEEP STUDY- HEART AND SLEEP, NO CPAP USED SINCE MAY 2015  . Hypothyroidism     Past Surgical History  Procedure Laterality Date  . [place]  . Foot surgery Right 9.23.14  . Foot surgery Left 10.01.13  . Cystectomy  1975  . Ankle surgery Right 1980  . Hemorrhoid surgery  2005  . Tonsillectomy  1964  . Colonoscopy with propofol N/A 12/07/2013    Procedure: COLONOSCOPY WITH PROPOFOL;  Surgeon: Garlan Fair, MD;  [facility];  Service: Endoscopy;  Laterality: N/A;  . Carpometacarpel suspension plasty Right 12/14/2014    Procedure: RIGHT THUMB CARPOMETACARPAL ([place]) ARTHROPLASTY;  Surgeon: Leandrew Koyanagi, MD;  Location: [facility];  Service: Orthopedics;  Laterality: Right;    There were no vitals filed for this  visit.  Visit Diagnosis:  Stiffness of right hand joint  Weakness of right hand  Pain of right thumb      Subjective Assessment - 02/06/15 1109    Subjective  I see the Dr. this afternoon   Pertinent History see epic snapshot   Patient Stated Goals to get full function in my dominant hand   Currently in Pain? Yes   Pain Score 3    Pain Location Finger (Comment which one)  thumb   Pain Orientation Right   Pain Descriptors / Indicators Aching;Sore   Pain Type Chronic pain   Pain Onset More than a month ago   Pain Frequency Constant   Aggravating Factors  overuse   Pain Relieving Factors meds, heat, rest                      OT Treatments/Exercises (OP) - 02/06/15 0001    Hand Exercises   Other Hand Exercises Putty ex's in mass grasp, tip pinch, and  thumb flexion with yellow resistance x 10 reps each   RUE Paraffin   Number Minutes Paraffin 12 Minutes   RUE Paraffin Location Hand  and wrist   Comments at beginning of session to decrease pain and stiffness   Splinting   Splinting Pt fitted for neoprene CMC splint and issued, but instructed pt to ask MD at  appointment when she may d/c thermoplastic splint before switching to neoprene (pt will be 8 weeks post-op on Wednesday and protocol ok with d/c splint at that time)   Manual Therapy   Soft tissue mobilization along radial base of thumb   Passive ROM at thumb in MP flexion, radial and palmer abduction, followed by P/ROM in radial deviation                  OT Short Term Goals - 01/26/15 0914    OT SHORT TERM GOAL #1   Title Independent with HEP - DUE 02/04/15   Time 4   Period Weeks   Status Achieved   OT SHORT TERM GOAL #2   Title Independent w/ new CMC splint for greater comfort and fit   Time 4   Period Weeks   Status Achieved   OT SHORT TERM GOAL #3   Title Pt to improve MP flexion Rt thumb by 10 degrees   Baseline 30* (Lt = 55*)   Time 4   Period Weeks   Status On-going   OT  SHORT TERM GOAL #4   Title Pt to improve palmer abduction Rt thumb by 10 degrees or greater   Baseline 50* (Lt = 80)   Time 4   Period Weeks   Status Achieved  01/26/15: Rt = 60*   OT SHORT TERM GOAL #5   Title Pt to return to eating consistently and grooming with Rt hand   Time 4   Period Weeks   Status Achieved  per pt report   OT SHORT TERM GOAL #6   Title Pt to hook buttons independently    Time 4   Period Weeks   Status Partially Met  can do shirts, sometimes requires assist with pants buttons           OT Long Term Goals - 01/05/15 1525    OT LONG TERM GOAL #1   Title Independent with updated HEP - DUE 03/06/15   Time 8   Period Weeks   Status New   OT LONG TERM GOAL #2   Title Pain less than or equal to 3/10 with normal daily activities   Time 8   Period Weeks   Status New   OT LONG TERM GOAL #3   Title Pt to verbalize understanding with A/E needs and/or compensations to increase ease and decrease pain with opening medicine bottles, jars, cans and lifting pots/pans, etc.    Time 8   Period Weeks   Status New   OT LONG TERM GOAL #4   Title Pt to return to all IADLS including cooking/cleaning with Rt hand as dominant hand   Time 8   Period Weeks   Status New   OT LONG TERM GOAL #5   Title Grip strength to be 25 lbs or greater for opening various containers   Baseline unable to assess at eval d/t precautions   Time 8   Period Weeks   Status New   Long Term Additional Goals   Additional Long Term Goals Yes   OT LONG TERM GOAL #6   Title Lateral pinch strength to be 12 lbs. or greater for cutting and opening packages    Time 8   Period Weeks   Status New               Plan - 02/06/15 1149    Clinical Impression Statement Pt continues to have mild pain which increases after working  and repetitive use.    Plan continue paraffin, light strengthening   OT Home Exercise Plan 01/05/15: A/ROM HEP. 01/16/15: P/ROM HEP. 01/18/15: scar massage and  desensitization techniques   Consulted and Agree with Plan of Care Patient        Problem List Patient Active Problem List   Diagnosis Date Noted  . OSA on CPAP 10/05/2012  . HTN (hypertension) 10/05/2012  . Hyperlipidemia 10/05/2012  . Migraine headache 10/05/2012    Carey Bullocks, OTR/L 02/06/2015, 11:51 AM  [facility] [address] [place], [place], [address] Phone: [phone]   Fax:  [phone]  Name: Jenny Bradford MRN: [id number] Date of Birth: [birth year]

## 2015-02-08 ENCOUNTER — Encounter: Payer: Self-pay | Admitting: Occupational Therapy

## 2015-02-08 ENCOUNTER — Ambulatory Visit: Payer: 59 | Admitting: Occupational Therapy

## 2015-02-08 DIAGNOSIS — M79644 Pain in right finger(s): Secondary | ICD-10-CM | POA: Diagnosis not present

## 2015-02-08 DIAGNOSIS — R29898 Other symptoms and signs involving the musculoskeletal system: Secondary | ICD-10-CM

## 2015-02-08 DIAGNOSIS — M25641 Stiffness of right hand, not elsewhere classified: Secondary | ICD-10-CM

## 2015-02-08 NOTE — Therapy (Signed)
[facility] [address] [place], [place], [address] Phone: [phone]   Fax:  [phone]  Occupational Therapy Treatment  Patient Details  Name: Jenny Bradford MRN: [id number] Date of Birth: Jul 13, 1950 Referring Provider: Dr. Marianna Payment (orthopedist)  Encounter Date: 02/08/2015      OT End of Session - 02/08/15 1223    Visit Number 8   Number of Visits 17   Date for OT Re-Evaluation 03/06/15   Authorization Type [facility]   OT Start Time 1100   OT Stop Time 1145   OT Time Calculation (min) 45 min   Equipment Utilized During Treatment fluidotherapy   Activity Tolerance Patient tolerated treatment well      Past Medical History  Diagnosis Date  . Hypertension 04/01/11    ECHO-EF>55% NUC STRESS TEST- 05/01/12  . Hyperlipidemia   . GERD (gastroesophageal reflux disease)   . H/O hiatal hernia   . Headache(784.0)     migraines  . Arthritis     oa  . Complication of anesthesia     slow to awaken in past  . PONV (postoperative nausea and vomiting)   . Family history of anesthesia complication     slow to awaken  . Sleep apnea 05/01/12 & 05/29/12    SLEEP STUDY-[facility] SLEEP, NO CPAP USED SINCE MAY 2015  . Hypothyroidism     Past Surgical History  Procedure Laterality Date  . [place]  . Foot surgery Right 9.23.14  . Foot surgery Left 10.01.13  . Cystectomy  1975  . Ankle surgery Right 1980  . Hemorrhoid surgery  2005  . Tonsillectomy  1964  . Colonoscopy with propofol N/A 12/07/2013    Procedure: COLONOSCOPY WITH PROPOFOL;  Surgeon: Garlan Fair, MD;  [facility];  Service: Endoscopy;  Laterality: N/A;  . Carpometacarpel suspension plasty Right 12/14/2014    Procedure: RIGHT THUMB CARPOMETACARPAL ([place]) ARTHROPLASTY;  Surgeon: Leandrew Koyanagi, MD;  Location: [facility];  Service: Orthopedics;  Laterality: Right;    There were no vitals filed for  this visit.  Visit Diagnosis:  Stiffness of right hand joint  Weakness of right hand  Pain of right thumb      Subjective Assessment - 02/08/15 1108    Subjective  I saw the Dr. and he said it was fine to switch to the neoprene splint now. I made lasagna last night and needed help opening the can.    Pertinent History see epic snapshot   Patient Stated Goals to get full function in my dominant hand   Currently in Pain? Yes   Pain Score 3    Pain Location Wrist   Pain Orientation Right   Pain Descriptors / Indicators Aching;Sore   Pain Type Chronic pain   Pain Onset More than a month ago   Pain Frequency Constant   Aggravating Factors  overuse   Pain Relieving Factors meds, heat, rest                      OT Treatments/Exercises (OP) - 02/08/15 0001    ADLs   ADL Comments Began assessing LTG's and progress to date. Also, discussed further potential A/E needs for home and at work to open medicine bottles. Pt provided handouts on A/E (incuding jar and bottle openers, and various medicine bottle openers)    RUE Fluidotherapy   Number Minutes Fluidotherapy 12 Minutes   RUE Fluidotherapy Location Hand;Wrist  Comments at beginning of session to decrease pain/stiffness                OT Education - 02/08/15 1222    Education provided Yes   Education Details A/E Recommendations   Person(s) Educated Patient   Methods Explanation;Handout   Comprehension Verbalized understanding          OT Short Term Goals - 02/08/15 1224    OT SHORT TERM GOAL #1   Title Independent with HEP - DUE 02/04/15   Time 4   Period Weeks   Status Achieved   OT SHORT TERM GOAL #2   Title Independent w/ new CMC splint for greater comfort and fit   Time 4   Period Weeks   Status Achieved   OT SHORT TERM GOAL #3   Title Pt to improve MP flexion Rt thumb by 10 degrees   Baseline 30* (Lt = 55*)   Time 4   Period Weeks   Status On-going   OT SHORT TERM GOAL #4   Title Pt  to improve palmer abduction Rt thumb by 10 degrees or greater   Baseline 50* (Lt = 80)   Time 4   Period Weeks   Status Achieved  01/26/15: Rt = 60*   OT SHORT TERM GOAL #5   Title Pt to return to eating consistently and grooming with Rt hand   Time 4   Period Weeks   Status Achieved  per pt report   OT SHORT TERM GOAL #6   Title Pt to hook buttons independently    Time 4   Period Weeks   Status Achieved           OT Long Term Goals - 02/08/15 1225    OT LONG TERM GOAL #1   Title Independent with updated HEP - DUE 03/06/15   Time 8   Period Weeks   Status Achieved   OT LONG TERM GOAL #2   Title Pain less than or equal to 3/10 with normal daily activities   Time 8   Period Weeks   Status On-going  3-5/10 pain   OT LONG TERM GOAL #3   Title Pt to verbalize understanding with A/E needs and/or compensations to increase ease and decrease pain with opening medicine bottles, jars, cans and lifting pots/pans, etc.    Time 8   Period Weeks   Status Achieved   OT LONG TERM GOAL #4   Title Pt to return to all IADLS including cooking/cleaning with Rt hand as dominant hand   Time 8   Period Weeks   Status On-going   OT LONG TERM GOAL #5   Title Grip strength to be 25 lbs or greater for opening various containers   Baseline unable to assess at eval d/t precautions   Time 8   Period Weeks   Status New   OT LONG TERM GOAL #6   Title Lateral pinch strength to be 12 lbs. or greater for cutting and opening packages    Time 8   Period Weeks   Status New               Plan - 02/08/15 1225    Clinical Impression Statement Pt met LTG's #1 and #3 today. Approximating remaining goals.    Plan Assess remaining LTG's - anticipate d/c by end of next week, update to red resistance putty   OT Home Exercise Plan 01/05/15: A/ROM HEP. 01/16/15: P/ROM HEP. 01/18/15: scar massage and  desensitization techniques   Consulted and Agree with Plan of Care Patient        Problem  List Patient Active Problem List   Diagnosis Date Noted  . OSA on CPAP 10/05/2012  . HTN (hypertension) 10/05/2012  . Hyperlipidemia 10/05/2012  . Migraine headache 10/05/2012    Carey Bullocks, OTR/L 02/08/2015, 12:29 PM  [facility] [address] [place], [place], [address] Phone: [phone]   Fax:  [phone]  Name: NEA GITTENS MRN: [id number] Date of Birth: November 12, 1950

## 2015-02-13 ENCOUNTER — Ambulatory Visit: Payer: 59 | Attending: Orthopaedic Surgery | Admitting: Occupational Therapy

## 2015-02-13 ENCOUNTER — Encounter: Payer: Self-pay | Admitting: Occupational Therapy

## 2015-02-13 DIAGNOSIS — M79644 Pain in right finger(s): Secondary | ICD-10-CM | POA: Diagnosis present

## 2015-02-13 DIAGNOSIS — M25641 Stiffness of right hand, not elsewhere classified: Secondary | ICD-10-CM | POA: Insufficient documentation

## 2015-02-13 DIAGNOSIS — R29898 Other symptoms and signs involving the musculoskeletal system: Secondary | ICD-10-CM

## 2015-02-13 DIAGNOSIS — M6289 Other specified disorders of muscle: Secondary | ICD-10-CM | POA: Diagnosis present

## 2015-02-13 NOTE — Therapy (Signed)
[facility] [address] [place], [place], [address] Phone: [phone]   Fax:  [phone]  Occupational Therapy Treatment  Patient Details  Name: Jenny Bradford MRN: [id number] Date of Birth: 02-Feb-1951 Referring Provider: Dr. Marianna Payment (orthopedist)  Encounter Date: 02/13/2015      OT End of Session - 02/13/15 1144    Visit Number 9   Number of Visits 17   Date for OT Re-Evaluation 03/06/15   Authorization Type [facility] [employer]   OT Start Time 1100   OT Stop Time 1145   OT Time Calculation (min) 45 min   Equipment Utilized During Treatment paraffin   Activity Tolerance Patient tolerated treatment well      Past Medical History  Diagnosis Date  . Hypertension 04/01/11    ECHO-EF>55% NUC STRESS TEST- 05/01/12  . Hyperlipidemia   . GERD (gastroesophageal reflux disease)   . H/O hiatal hernia   . Headache(784.0)     migraines  . Arthritis     oa  . Complication of anesthesia     slow to awaken in past  . PONV (postoperative nausea and vomiting)   . Family history of anesthesia complication     slow to awaken  . Sleep apnea 05/01/12 & 05/29/12    SLEEP STUDY-[facility], NO CPAP USED SINCE MAY 2015  . Hypothyroidism     Past Surgical History  Procedure Laterality Date  . [place]  . Foot surgery Right 9.23.14  . Foot surgery Left 10.01.13  . Cystectomy  1975  . Ankle surgery Right 1980  . Hemorrhoid surgery  2005  . Tonsillectomy  1964  . Colonoscopy with propofol N/A 12/07/2013    Procedure: COLONOSCOPY WITH PROPOFOL;  Surgeon: Garlan Fair, MD;  [facility]: [facility];  Service: Endoscopy;  Laterality: N/A;  . Carpometacarpel suspension plasty Right 12/14/2014    Procedure: RIGHT THUMB CARPOMETACARPAL ([place]) ARTHROPLASTY;  Surgeon: Leandrew Koyanagi, MD;  Location: [facility];  Service: Orthopedics;  Laterality: Right;    There were no vitals filed for this  visit.  Visit Diagnosis:  Stiffness of right hand joint  Weakness of right hand      Subjective Assessment - 02/13/15 1119    Subjective  I have no pain today, but it was 6-7/10 pain on Saturday and Sunday after working 2 days straight (Thurs and Fri) last week   Pertinent History see epic snapshot   Repetition Increases Symptoms   Patient Stated Goals to get full function in my dominant hand   Currently in Pain? No/denies            [facility] OT Assessment - 02/13/15 0001    Hand Function   Right Hand Grip (lbs) 35 lbs   Right Hand Lateral Pinch --  8-10 lbs                  OT Treatments/Exercises (OP) - 02/13/15 0001    ADLs   Writing Pt issued foam to build up pen for writing to increase ease and decrease pain for extended writing. Pt practiced with greater ease   ADL Comments Assessed grip and pinch strength in prep for d/c next session - see assessment and LTG's for progress   Hand Exercises   Other Hand Exercises Updated to red resistance putty but only instructed pt to perform mass composite flexion with red putty and to continue tip and lateral pinch ex's with yellow putty  until easy and no longer painful. Pt agreed   Other Hand Exercises Gripper set at 25 lbs resistance to pick up blocks for sustained grip strength. Pt also instructed to avoid ulnar deviation while performing. Pt performed task with min drops, rest breaks, fatigue and with mild pain after repetition.    RUE Paraffin   Number Minutes Paraffin 12 Minutes   RUE Paraffin Location Hand  and wrist   Comments at beginning of session to decrease stiffness                  OT Short Term Goals - 02/08/15 1224    OT SHORT TERM GOAL #1   Title Independent with HEP - DUE 02/04/15   Time 4   Period Weeks   Status Achieved   OT SHORT TERM GOAL #2   Title Independent w/ new CMC splint for greater comfort and fit   Time 4   Period Weeks   Status Achieved   OT SHORT TERM GOAL #3   Title  Pt to improve MP flexion Rt thumb by 10 degrees   Baseline 30* (Lt = 55*)   Time 4   Period Weeks   Status On-going   OT SHORT TERM GOAL #4   Title Pt to improve palmer abduction Rt thumb by 10 degrees or greater   Baseline 50* (Lt = 80)   Time 4   Period Weeks   Status Achieved  01/26/15: Rt = 60*   OT SHORT TERM GOAL #5   Title Pt to return to eating consistently and grooming with Rt hand   Time 4   Period Weeks   Status Achieved  per pt report   OT SHORT TERM GOAL #6   Title Pt to hook buttons independently    Time 4   Period Weeks   Status Achieved           OT Long Term Goals - 02/13/15 1145    OT LONG TERM GOAL #1   Title Independent with updated HEP - DUE 03/06/15   Time 8   Period Weeks   Status Achieved   OT LONG TERM GOAL #2   Title Pain less than or equal to 3/10 with normal daily activities   Time 8   Period Weeks   Status On-going  3-5/10 pain   OT LONG TERM GOAL #3   Title Pt to verbalize understanding with A/E needs and/or compensations to increase ease and decrease pain with opening medicine bottles, jars, cans and lifting pots/pans, etc.    Time 8   Period Weeks   Status Achieved   OT LONG TERM GOAL #4   Title Pt to return to all IADLS including cooking/cleaning with Rt hand as dominant hand   Time 8   Period Weeks   Status On-going   OT LONG TERM GOAL #5   Title Grip strength to be 25 lbs or greater for opening various containers   Baseline unable to assess at eval d/t precautions   Time 8   Period Weeks   Status Achieved  02/13/15: 35 lbs   OT LONG TERM GOAL #6   Title Lateral pinch strength to be 12 lbs. or greater for cutting and opening packages    Time 8   Period Weeks   Status Not Met  02/13/15: 8-10 lbs               Plan - 02/13/15 1146    Clinical Impression Statement Pt  progressing with grip strength. Pt still fluctuates with pain depending on activity level and pinch strength   Plan assess remaining LTG's and STG #  3, d/c next session   OT Home Exercise Plan 01/05/15: A/ROM HEP. 01/16/15: P/ROM HEP. 01/18/15: scar massage and desensitization techniques   Consulted and Agree with Plan of Care Patient        Problem List Patient Active Problem List   Diagnosis Date Noted  . OSA on CPAP 10/05/2012  . HTN (hypertension) 10/05/2012  . Hyperlipidemia 10/05/2012  . Migraine headache 10/05/2012    Carey Bullocks, OTR/L 02/13/2015, 11:49 AM  [facility] [address] [place], [place], [address] Phone: [phone]   Fax:  [phone]  Name: Jenny Bradford MRN: [id number] Date of Birth: 12-Jul-1950

## 2015-02-15 ENCOUNTER — Encounter: Payer: 59 | Admitting: Occupational Therapy

## 2015-02-16 ENCOUNTER — Encounter: Payer: Self-pay | Admitting: Occupational Therapy

## 2015-02-16 ENCOUNTER — Ambulatory Visit: Payer: 59 | Admitting: Occupational Therapy

## 2015-02-16 DIAGNOSIS — M25641 Stiffness of right hand, not elsewhere classified: Secondary | ICD-10-CM

## 2015-02-16 DIAGNOSIS — R29898 Other symptoms and signs involving the musculoskeletal system: Secondary | ICD-10-CM

## 2015-02-16 DIAGNOSIS — M79644 Pain in right finger(s): Secondary | ICD-10-CM

## 2015-02-16 NOTE — Therapy (Signed)
[name] [address] [place], [place], [address] Phone: [phone]   Fax:  [phone]  Occupational Therapy Treatment  Patient Details  Name: Jenny Bradford MRN: [id number] Date of Birth: 15-Dec-1950 Referring Provider: Dr. Marianna Payment (orthopedist)  Encounter Date: 02/16/2015    Past Medical History  Diagnosis Date  . Hypertension 04/01/11    ECHO-EF>55% NUC STRESS TEST- 05/01/12  . Hyperlipidemia   . GERD (gastroesophageal reflux disease)   . H/O hiatal hernia   . Headache(784.0)     migraines  . Arthritis     oa  . Complication of anesthesia     slow to awaken in past  . PONV (postoperative nausea and vomiting)   . Family history of anesthesia complication     slow to awaken  . Sleep apnea 05/01/12 & 05/29/12    SLEEP STUDY- HEART AND SLEEP, NO CPAP USED SINCE MAY 2015  . Hypothyroidism     Past Surgical History  Procedure Laterality Date  . [place]  . Foot surgery Right 9.23.14  . Foot surgery Left 10.01.13  . Cystectomy  1975  . Ankle surgery Right 1980  . Hemorrhoid surgery  2005  . Tonsillectomy  1964  . Colonoscopy with propofol N/A 12/07/2013    Procedure: COLONOSCOPY WITH PROPOFOL;  Surgeon: Garlan Fair, MD;  [facility];  Service: Endoscopy;  Laterality: N/A;  . Carpometacarpel suspension plasty Right 12/14/2014    Procedure: RIGHT THUMB CARPOMETACARPAL ([place]) ARTHROPLASTY;  Surgeon: Leandrew Koyanagi, MD;  Location: [facility];  Service: Orthopedics;  Laterality: Right;    There were no vitals filed for this visit.  Visit Diagnosis:  Stiffness of right hand joint  Weakness of right hand  Pain of right thumb      Subjective Assessment - 02/16/15 1115    Pertinent History see epic snapshot   Repetition Increases Symptoms   Patient Stated Goals to get full function in my dominant hand   Currently in Pain? Yes   Pain Score 4     Pain Location Wrist  and thumb   Pain Orientation Right   Pain Descriptors / Indicators Aching;Sore   Pain Type Chronic pain   Pain Onset More than a month ago   Pain Frequency Constant   Aggravating Factors  overuse   Pain Relieving Factors meds, heat, rest, neoprene CMC splint                      OT Treatments/Exercises (OP) - 02/16/15 0001    ADLs   ADL Comments Assessed remaining goals and progress to date. Discussed d/c and reviewed A/E needs.    Hand Exercises   Other Hand Exercises Gripper set at level 1 resistance to pick up blocks for sustained grip strength. Pt also instructed to avoid ulnar deviation while performing. Pt performed task with min drops, rest breaks, fatigue and with mild increase in pain after repetition.    RUE Fluidotherapy   Number Minutes Fluidotherapy 12 Minutes   RUE Fluidotherapy Location Hand;Wrist   Comments at beginning of session to decrease pain/stiffness                  OT Short Term Goals - 02/08/15 1224    OT SHORT TERM GOAL #1   Title Independent with HEP - DUE 02/04/15   Time 4   Period Weeks   Status Achieved   OT SHORT TERM GOAL #2  Title Independent w/ new CMC splint for greater comfort and fit   Time 4   Period Weeks   Status Achieved   OT SHORT TERM GOAL #3   Title Pt to improve MP flexion Rt thumb by 10 degrees   Baseline 30* (Lt = 55*)   Time 4   Period Weeks   Status Not met   OT SHORT TERM GOAL #4   Title Pt to improve palmer abduction Rt thumb by 10 degrees or greater   Baseline 50* (Lt = 80)   Time 4   Period Weeks   Status Achieved  01/26/15: Rt = 60*   OT SHORT TERM GOAL #5   Title Pt to return to eating consistently and grooming with Rt hand   Time 4   Period Weeks   Status Achieved  per pt report   OT SHORT TERM GOAL #6   Title Pt to hook buttons independently    Time 4   Period Weeks   Status Achieved           OT Long Term Goals - 02/16/15 1136    OT LONG TERM GOAL  #1   Title Independent with updated HEP - DUE 03/06/15   Time 8   Period Weeks   Status Achieved   OT LONG TERM GOAL #2   Title Pain less than or equal to 3/10 with normal daily activities   Time 8   Period Weeks   Status Not Met  3-5/10 pain   OT LONG TERM GOAL #3   Title Pt to verbalize understanding with A/E needs and/or compensations to increase ease and decrease pain with opening medicine bottles, jars, cans and lifting pots/pans, etc.    Time 8   Period Weeks   Status Achieved   OT LONG TERM GOAL #4   Title Pt to return to all IADLS including cooking/cleaning with Rt hand as dominant hand   Time 8   Period Weeks   Status Not Met  Pt using Rt hand as dominant for BADLS and dusting, but still requires both hands or Lt hand with cooking tasks, has not returned to heavier cleaning d/t pain   OT LONG TERM GOAL #5   Title Grip strength to be 25 lbs or greater for opening various containers   Baseline unable to assess at eval d/t precautions   Time 8   Period Weeks   Status Achieved  02/13/15: 35 lbs   OT LONG TERM GOAL #6   Title Lateral pinch strength to be 12 lbs. or greater for cutting and opening packages    Time 8   Period Weeks   Status Not Met  02/13/15: 8-10 lbs               Plan - 02/16/15 1138    Clinical Impression Statement Pt met LTG's #1, 3, and 5. Pt still has fluctuating pain after working and decreased pinch strength.    Plan D/C O.T.    OT Home Exercise Plan 01/05/15: A/ROM HEP. 01/16/15: P/ROM HEP. 01/18/15: scar massage and desensitization techniques   Consulted and Agree with Plan of Care Patient        Problem List Patient Active Problem List   Diagnosis Date Noted  . OSA on CPAP 10/05/2012  . HTN (hypertension) 10/05/2012  . Hyperlipidemia 10/05/2012  . Migraine headache 10/05/2012   OCCUPATIONAL THERAPY DISCHARGE SUMMARY  Visits from Start of Care: 10  Current functional level related to goals /  functional outcomes: See above    Remaining deficits: Pain in wrist/thumb Strength in hand    Education / Equipment: HEP's, A/E recommendations, pain reduction strategies, task modifications  Plan: Patient agrees to discharge.  Patient goals were partially met. Patient is being discharged due to                                                     Reaching maximal rehab potential at this time?????        Carey Bullocks, OTR/L 02/16/2015, 11:51 AM  [facility] [address] [place], [place], [address] Phone: [phone]   Fax:  [phone]  Name: JORY WELKE MRN: [id number] Date of Birth: 10/03/1950

## 2015-02-20 ENCOUNTER — Encounter: Payer: 59 | Admitting: Occupational Therapy

## 2015-02-22 ENCOUNTER — Encounter: Payer: 59 | Admitting: Occupational Therapy

## 2015-02-23 ENCOUNTER — Encounter: Payer: 59 | Admitting: Occupational Therapy

## 2015-02-27 ENCOUNTER — Encounter: Payer: 59 | Admitting: Occupational Therapy

## 2015-03-16 ENCOUNTER — Telehealth: Payer: Self-pay | Admitting: Genetic Counselor

## 2015-03-16 DIAGNOSIS — M0889 Other juvenile arthritis, multiple sites: Secondary | ICD-10-CM | POA: Diagnosis not present

## 2015-03-16 DIAGNOSIS — E785 Hyperlipidemia, unspecified: Secondary | ICD-10-CM | POA: Diagnosis not present

## 2015-03-16 DIAGNOSIS — N182 Chronic kidney disease, stage 2 (mild): Secondary | ICD-10-CM | POA: Diagnosis not present

## 2015-03-16 DIAGNOSIS — I129 Hypertensive chronic kidney disease with stage 1 through stage 4 chronic kidney disease, or unspecified chronic kidney disease: Secondary | ICD-10-CM | POA: Diagnosis not present

## 2015-03-16 DIAGNOSIS — F3340 Major depressive disorder, recurrent, in remission, unspecified: Secondary | ICD-10-CM | POA: Diagnosis not present

## 2015-03-16 NOTE — Telephone Encounter (Signed)
PT CONFIRMED APPT AND MAILED OUT CALENDAR

## 2015-04-03 DIAGNOSIS — M84375D Stress fracture, left foot, subsequent encounter for fracture with routine healing: Secondary | ICD-10-CM | POA: Diagnosis not present

## 2015-04-04 DIAGNOSIS — H524 Presbyopia: Secondary | ICD-10-CM | POA: Diagnosis not present

## 2015-04-06 ENCOUNTER — Other Ambulatory Visit (HOSPITAL_COMMUNITY): Payer: Self-pay | Admitting: Orthopaedic Surgery

## 2015-04-06 DIAGNOSIS — M84375D Stress fracture, left foot, subsequent encounter for fracture with routine healing: Secondary | ICD-10-CM

## 2015-04-11 ENCOUNTER — Ambulatory Visit (HOSPITAL_COMMUNITY)
Admission: RE | Admit: 2015-04-11 | Discharge: 2015-04-11 | Disposition: A | Discharge status: Home / Self Care | Payer: 59 | Source: Ambulatory Visit | Attending: Orthopaedic Surgery | Admitting: Orthopaedic Surgery

## 2015-04-11 ENCOUNTER — Encounter: Payer: 59 | Admitting: Genetic Counselor

## 2015-04-11 ENCOUNTER — Other Ambulatory Visit: Payer: 59

## 2015-04-11 DIAGNOSIS — M84375K Stress fracture, left foot, subsequent encounter for fracture with nonunion: Secondary | ICD-10-CM | POA: Diagnosis not present

## 2015-04-11 DIAGNOSIS — M84372K Stress fracture, left ankle, subsequent encounter for fracture with nonunion: Secondary | ICD-10-CM | POA: Diagnosis not present

## 2015-04-11 DIAGNOSIS — M7732 Calcaneal spur, left foot: Secondary | ICD-10-CM | POA: Diagnosis not present

## 2015-04-11 DIAGNOSIS — X58XXXD Exposure to other specified factors, subsequent encounter: Secondary | ICD-10-CM | POA: Insufficient documentation

## 2015-04-11 DIAGNOSIS — M84375D Stress fracture, left foot, subsequent encounter for fracture with routine healing: Secondary | ICD-10-CM | POA: Diagnosis present

## 2015-04-14 DIAGNOSIS — N182 Chronic kidney disease, stage 2 (mild): Secondary | ICD-10-CM | POA: Diagnosis not present

## 2015-04-14 DIAGNOSIS — E559 Vitamin D deficiency, unspecified: Secondary | ICD-10-CM | POA: Diagnosis not present

## 2015-04-14 DIAGNOSIS — I129 Hypertensive chronic kidney disease with stage 1 through stage 4 chronic kidney disease, or unspecified chronic kidney disease: Secondary | ICD-10-CM | POA: Diagnosis not present

## 2015-04-14 DIAGNOSIS — I714 Abdominal aortic aneurysm, without rupture: Secondary | ICD-10-CM | POA: Diagnosis not present

## 2015-04-14 MED FILL — HYDROCORTISONE 2.5% CREAM: 2.5 | 30 days supply | Qty: 60 | Fill #0

## 2015-04-14 MED FILL — LEVOTHYROXINE 50 MCG TABLET: 50 | 90 days supply | Qty: 90 | Fill #3

## 2015-04-14 MED FILL — LOSARTAN POTASSIUM 50 MG TA: 50 | 90 days supply | Qty: 135 | Fill #0

## 2015-04-14 MED FILL — ALPRAZolam 0.25 MG TABS: 0.25 | 30 days supply | Qty: 30 | Fill #2

## 2015-04-14 MED FILL — raNITIdine HCL 150 MG TABS: 150 | 90 days supply | Qty: 180 | Fill #1

## 2015-04-14 MED FILL — PRAVASTATIN NA 40 MG TAB: 40 | 90 days supply | Qty: 90 | Fill #1

## 2015-04-14 MED FILL — DICLOFENAC SODIUM 1% GEL: 1 | 12 days supply | Qty: 100 | Fill #3

## 2015-04-17 DIAGNOSIS — M84375D Stress fracture, left foot, subsequent encounter for fracture with routine healing: Secondary | ICD-10-CM | POA: Diagnosis not present

## 2015-04-17 MED FILL — traMADol HCL 50 MG TABS: 50 | 10 days supply | Qty: 30 | Fill #0

## 2015-04-18 MED FILL — OSCIMIN 0.125 MG TABLET: 0.125 | 10 days supply | Qty: 20 | Fill #0

## 2015-05-01 DIAGNOSIS — H02834 Dermatochalasis of left upper eyelid: Secondary | ICD-10-CM | POA: Diagnosis not present

## 2015-05-01 DIAGNOSIS — H02831 Dermatochalasis of right upper eyelid: Secondary | ICD-10-CM | POA: Diagnosis not present

## 2015-05-11 MED FILL — ALPRAZolam 0.25 MG TABS: 0.25 | 30 days supply | Qty: 30 | Fill #3

## 2015-05-11 MED FILL — ESTRACE 0.01% CREAM: 0.1 | 90 days supply | Qty: 43 | Fill #1

## 2015-05-11 MED FILL — traMADol HCL 50 MG TABS: 50 | 10 days supply | Qty: 30 | Fill #1

## 2015-05-19 MED FILL — ESCITALOPRAM 20 MG TABLET: 20 | 90 days supply | Qty: 90 | Fill #0

## 2015-06-05 DIAGNOSIS — H02831 Dermatochalasis of right upper eyelid: Secondary | ICD-10-CM | POA: Diagnosis not present

## 2015-06-05 DIAGNOSIS — H02054 Trichiasis without entropian left upper eyelid: Secondary | ICD-10-CM | POA: Diagnosis not present

## 2015-06-05 DIAGNOSIS — H02834 Dermatochalasis of left upper eyelid: Secondary | ICD-10-CM | POA: Diagnosis not present

## 2015-06-05 DIAGNOSIS — H02051 Trichiasis without entropian right upper eyelid: Secondary | ICD-10-CM | POA: Diagnosis not present

## 2015-06-08 MED FILL — ERYTHROMYCIN EYE OINTMENT: 5 | 14 days supply | Qty: 4 | Fill #0

## 2015-06-08 MED FILL — SUMATRIPTAN SUCC 100 MG TAB: 100 | 30 days supply | Qty: 9 | Fill #0

## 2015-07-06 ENCOUNTER — Telehealth: Payer: Self-pay | Admitting: *Deleted

## 2015-07-06 ENCOUNTER — Telehealth: Payer: Self-pay | Admitting: Cardiovascular Disease

## 2015-07-06 NOTE — Telephone Encounter (Signed)
Left a message for the patient to return a call to me today before 5:00, or on Monday Afternoon. I will be out of the office on tomorrow. I need to discuss the referral to Dr Ron Parker.

## 2015-07-06 NOTE — Telephone Encounter (Signed)
Faxed referral wit last office note and sleep studies to Dr Oneal Grout per patient's request.

## 2015-07-06 NOTE — Telephone Encounter (Signed)
Needed to send a staff message

## 2015-07-10 MED FILL — DICLOFENAC SODIUM 1% GEL: 1 | 12 days supply | Qty: 100 | Fill #4

## 2015-07-10 MED FILL — LOSARTAN POTASSIUM 50 MG TA: 50 | 90 days supply | Qty: 135 | Fill #1

## 2015-07-10 MED FILL — HYDROCHLOROTHIAZIDE 25 MG T: 25 | 90 days supply | Qty: 90 | Fill #0

## 2015-07-10 MED FILL — raNITIdine HCL 150 MG TABS: 150 | 90 days supply | Qty: 180 | Fill #0

## 2015-07-10 MED FILL — LEVOTHYROXINE 50 MCG TABLET: 50 | 90 days supply | Qty: 90 | Fill #4

## 2015-07-10 MED FILL — PRAVASTATIN NA 40 MG TAB: 40 | 90 days supply | Qty: 90 | Fill #0

## 2015-08-08 DIAGNOSIS — G4733 Obstructive sleep apnea (adult) (pediatric): Secondary | ICD-10-CM | POA: Diagnosis not present

## 2015-08-08 MED FILL — ALPRAZolam 0.25 MG TABS: 0.25 | 30 days supply | Qty: 30 | Fill #0

## 2015-08-08 MED FILL — ESTRACE 0.01% CREAM: 0.1 | 90 days supply | Qty: 43 | Fill #2

## 2015-08-24 ENCOUNTER — Other Ambulatory Visit: Payer: Self-pay | Admitting: Obstetrics & Gynecology

## 2015-08-24 DIAGNOSIS — Z139 Encounter for screening, unspecified: Secondary | ICD-10-CM

## 2015-09-14 ENCOUNTER — Ambulatory Visit
Admission: RE | Admit: 2015-09-14 | Discharge: 2015-09-14 | Disposition: A | Discharge status: Home / Self Care | Payer: 59 | Source: Ambulatory Visit | Attending: Obstetrics & Gynecology | Admitting: Obstetrics & Gynecology

## 2015-09-14 DIAGNOSIS — Z139 Encounter for screening, unspecified: Secondary | ICD-10-CM

## 2015-09-14 MED FILL — ESCITALOPRAM 20 MG TABLET: 20 | 30 days supply | Qty: 30 | Fill #0

## 2015-09-14 MED FILL — ALPRAZolam 0.25 MG TABS: 0.25 | 30 days supply | Qty: 30 | Fill #1

## 2015-09-28 MED FILL — LEVOTHYROXINE 50 MCG TABLET: 50 | 30 days supply | Qty: 30 | Fill #0

## 2015-09-29 MED FILL — BUTALB-ACETAMIN-CAFF 50-325: 50-325-40 | 10 days supply | Qty: 60 | Fill #0

## 2015-10-24 MED FILL — DOXYCYCLINE HYCLATE 100 MG: 100 | 7 days supply | Qty: 14 | Fill #0

## 2015-10-24 MED FILL — MUPIROCIN 2% CREAM: 2 | 10 days supply | Qty: 15 | Fill #0

## 2015-10-25 MED FILL — ALPRAZolam 0.25 MG TABS: 0.25 | 30 days supply | Qty: 30 | Fill #2

## 2015-10-25 MED FILL — LOSARTAN POTASSIUM 50 MG TA: 50 | 30 days supply | Qty: 45 | Fill #0

## 2015-10-25 MED FILL — ESCITALOPRAM 20 MG TABLET: 20 | 30 days supply | Qty: 30 | Fill #1

## 2015-10-25 MED FILL — raNITIdine HCL 150 MG TABS: 150 | 30 days supply | Qty: 60 | Fill #0

## 2015-11-02 MED FILL — FLUCONAZOLE 150 MG TABLET: 150 | 3 days supply | Qty: 2 | Fill #0

## 2015-11-03 MED FILL — SUMATRIPTAN SUCC 100 MG TAB: 100 | 30 days supply | Qty: 9 | Fill #1

## 2015-11-03 MED FILL — PRAVASTATIN NA 40 MG TAB: 40 | 30 days supply | Qty: 30 | Fill #0

## 2015-11-03 MED FILL — LEVOTHYROXINE 50 MCG TABLET: 50 | 30 days supply | Qty: 30 | Fill #1

## 2015-11-03 MED FILL — ESTRACE 0.01% CREAM: 0.1 | 30 days supply | Qty: 43 | Fill #3

## 2015-11-15 MED FILL — MUPIROCIN 2% CREAM: 2 | 10 days supply | Qty: 15 | Fill #1

## 2015-12-03 MED FILL — ALPRAZolam 0.25 MG TABS: 0.25 | 30 days supply | Qty: 30 | Fill #3

## 2015-12-03 MED FILL — raNITIdine HCL 150 MG TABS: 150 | 30 days supply | Qty: 60 | Fill #1

## 2015-12-03 MED FILL — BUTALB-ACETAMIN-CAFF 50-325: 50-325-40 | 10 days supply | Qty: 60 | Fill #1

## 2015-12-03 MED FILL — LEVOTHYROXINE 50 MCG TABLET: 50 | 30 days supply | Qty: 30 | Fill #2

## 2015-12-03 MED FILL — LOSARTAN POTASSIUM 50 MG TA: 50 | 30 days supply | Qty: 45 | Fill #1

## 2015-12-04 MED FILL — ESCITALOPRAM 20 MG TABLET: 20 | 30 days supply | Qty: 30 | Fill #2

## 2015-12-04 MED FILL — MUPIROCIN 2% CREAM: 2 | 10 days supply | Qty: 15 | Fill #2

## 2015-12-19 ENCOUNTER — Other Ambulatory Visit: Payer: Self-pay | Admitting: Obstetrics & Gynecology

## 2015-12-19 MED FILL — MUPIROCIN 2% CREAM: 2 | 10 days supply | Qty: 15 | Fill #0

## 2015-12-19 MED FILL — ESTRACE 0.01% CREAM: 0.1 | 31 days supply | Qty: 43 | Fill #0

## 2015-12-19 MED FILL — DOXYCYCLINE HYCLATE 100 MG: 100 | 7 days supply | Qty: 14 | Fill #0

## 2015-12-20 LAB — CYTOLOGY - PAP

## 2016-01-08 ENCOUNTER — Other Ambulatory Visit: Payer: Self-pay | Admitting: Orthopaedic Surgery

## 2016-01-08 MED FILL — MELOXICAM 7.5 MG TABLET: 7.5 | 30 days supply | Qty: 60 | Fill #1

## 2016-01-08 MED FILL — HYDROCHLOROTHIAZIDE 25 MG T: 25 | 30 days supply | Qty: 30 | Fill #0

## 2016-01-08 MED FILL — ESCITALOPRAM 20 MG TABLET: 20 | 30 days supply | Qty: 30 | Fill #0

## 2016-01-08 MED FILL — LEVOTHYROXINE 50 MCG TABLET: 50 | 30 days supply | Qty: 30 | Fill #3

## 2016-01-08 MED FILL — ALPRAZolam 0.25 MG TABS: 0.25 | 30 days supply | Qty: 30 | Fill #0

## 2016-01-08 MED FILL — LOSARTAN POTASSIUM 50 MG TA: 50 | 30 days supply | Qty: 45 | Fill #2

## 2016-02-08 MED FILL — LEVOTHYROXINE 50 MCG TABLET: 50 | 30 days supply | Qty: 30 | Fill #4

## 2016-02-08 MED FILL — ESTRACE 0.01% CREAM: 0.1 | 31 days supply | Qty: 43 | Fill #1

## 2016-02-08 MED FILL — LOSARTAN POTASSIUM 50 MG TA: 50 | 30 days supply | Qty: 45 | Fill #3

## 2016-02-08 MED FILL — ALPRAZolam 0.25 MG TABS: 0.25 | 30 days supply | Qty: 30 | Fill #1

## 2016-02-08 MED FILL — raNITIdine HCL 150 MG TABS: 150 | 30 days supply | Qty: 60 | Fill #2

## 2016-02-08 MED FILL — ESCITALOPRAM 20 MG TABLET: 20 | 30 days supply | Qty: 30 | Fill #1

## 2016-02-08 MED FILL — PRAVASTATIN NA 40 MG TAB: 40 | 30 days supply | Qty: 30 | Fill #1

## 2016-02-08 MED FILL — HYDROCHLOROTHIAZIDE 25 MG T: 25 | 30 days supply | Qty: 30 | Fill #1

## 2016-02-09 MED FILL — MUPIROCIN 2% CREAM: 2 | 10 days supply | Qty: 15 | Fill #1

## 2016-03-07 MED FILL — BUTALB-ACETAMIN-CAFF 50-325: 50-325-40 | 10 days supply | Qty: 60 | Fill #2

## 2016-03-07 MED FILL — LOSARTAN POTASSIUM 50 MG TA: 50 | 30 days supply | Qty: 45 | Fill #4

## 2016-03-07 MED FILL — PRAVASTATIN NA 40 MG TAB: 40 | 30 days supply | Qty: 30 | Fill #2

## 2016-03-07 MED FILL — ALPRAZolam 0.25 MG TABS: 0.25 | 30 days supply | Qty: 30 | Fill #2

## 2016-03-07 MED FILL — HYDROCHLOROTHIAZIDE 25 MG T: 25 | 30 days supply | Qty: 30 | Fill #2

## 2016-03-07 MED FILL — LEVOTHYROXINE 50 MCG TABLET: 50 | 30 days supply | Qty: 30 | Fill #5

## 2016-03-11 DIAGNOSIS — R918 Other nonspecific abnormal finding of lung field: Secondary | ICD-10-CM

## 2016-03-11 HISTORY — DX: Other nonspecific abnormal finding of lung field: R91.8

## 2016-03-13 MED FILL — ESCITALOPRAM 20 MG TABLET: 20 | 30 days supply | Qty: 30 | Fill #2

## 2016-03-21 MED FILL — AMOX TR-K CLV 875-125 MG TA: 875-125 | 7 days supply | Qty: 14 | Fill #0

## 2016-04-12 MED FILL — ESCITALOPRAM 20 MG TABLET: 20 | 30 days supply | Qty: 30 | Fill #3

## 2016-04-12 MED FILL — PRAVASTATIN NA 40 MG TAB: 40 | 30 days supply | Qty: 30 | Fill #3

## 2016-04-12 MED FILL — LEVOTHYROXINE 50 MCG TABLET: 50 | 30 days supply | Qty: 30 | Fill #0

## 2016-04-12 MED FILL — HYDROCHLOROTHIAZIDE 25 MG T: 25 | 30 days supply | Qty: 30 | Fill #3

## 2016-04-12 MED FILL — ESTRACE 0.01% CREAM: 0.1 | 31 days supply | Qty: 43 | Fill #2

## 2016-04-12 MED FILL — BUTALB-ACETAMIN-CAFF 50-325: 50-325-40 | 10 days supply | Qty: 60 | Fill #3

## 2016-04-12 MED FILL — raNITIdine HCL 150 MG TABS: 150 | 30 days supply | Qty: 60 | Fill #3

## 2016-04-12 MED FILL — LOSARTAN POTASSIUM 50 MG TA: 50 | 30 days supply | Qty: 45 | Fill #5

## 2016-04-12 MED FILL — ALPRAZolam 0.25 MG TABS: 0.25 | 30 days supply | Qty: 30 | Fill #3

## 2016-05-13 MED FILL — raNITIdine HCL 150 MG TABS: 150 | 30 days supply | Qty: 60 | Fill #4

## 2016-05-13 MED FILL — DOXYCYCLINE HYCLATE 100 MG: 100 | 7 days supply | Qty: 14 | Fill #1

## 2016-05-13 MED FILL — ESCITALOPRAM 20 MG TABLET: 20 | 30 days supply | Qty: 30 | Fill #4

## 2016-05-13 MED FILL — FLUCONAZOLE 150 MG TABLET: 150 | 3 days supply | Qty: 2 | Fill #0

## 2016-05-13 MED FILL — LOSARTAN POTASSIUM 50 MG TA: 50 | 30 days supply | Qty: 45 | Fill #0

## 2016-05-13 MED FILL — ALPRAZolam 0.25 MG TABS: 0.25 | 30 days supply | Qty: 30 | Fill #0

## 2016-05-13 MED FILL — HYDROCHLOROTHIAZIDE 25 MG T: 25 | 30 days supply | Qty: 30 | Fill #4

## 2016-05-13 MED FILL — PRAVASTATIN NA 40 MG TAB: 40 | 30 days supply | Qty: 30 | Fill #4

## 2016-05-13 MED FILL — LEVOTHYROXINE 50 MCG TABLET: 50 | 30 days supply | Qty: 30 | Fill #1

## 2016-05-14 MED FILL — OSCIMIN 0.125 MG TABLET: 0.125 | 10 days supply | Qty: 20 | Fill #0

## 2016-05-28 ENCOUNTER — Encounter (INDEPENDENT_AMBULATORY_CARE_PROVIDER_SITE_OTHER): Payer: Self-pay | Admitting: Orthopaedic Surgery

## 2016-05-28 ENCOUNTER — Ambulatory Visit (INDEPENDENT_AMBULATORY_CARE_PROVIDER_SITE_OTHER): Payer: 59

## 2016-05-28 ENCOUNTER — Ambulatory Visit (INDEPENDENT_AMBULATORY_CARE_PROVIDER_SITE_OTHER): Payer: 59 | Admitting: Orthopaedic Surgery

## 2016-05-28 DIAGNOSIS — M79672 Pain in left foot: Secondary | ICD-10-CM

## 2016-05-28 DIAGNOSIS — M1812 Unilateral primary osteoarthritis of first carpometacarpal joint, left hand: Secondary | ICD-10-CM

## 2016-05-28 MED ORDER — MELOXICAM 7.5 MG PO TABS: 7.5000 mg | ORAL_TABLET | Freq: Two times a day (BID) | ORAL | 2 refills | Status: DC | PRN

## 2016-05-28 MED ORDER — PREDNISONE 10 MG (21) PO TBPK: ORAL_TABLET | ORAL | 0 refills | Status: DC

## 2016-05-28 MED ORDER — TRAMADOL HCL 50 MG PO TABS: 50.0000 mg | ORAL_TABLET | Freq: Three times a day (TID) | ORAL | 5 refills | Status: DC | PRN

## 2016-05-28 MED ORDER — DICLOFENAC SODIUM 1 % TD GEL: 2.0000 g | Freq: Four times a day (QID) | TRANSDERMAL | 5 refills | Status: DC

## 2016-05-28 MED FILL — MELOXICAM 7.5 MG TABLET: 7.5 | 15 days supply | Qty: 30 | Fill #0

## 2016-05-28 MED FILL — traMADol HCL 50 MG TABS: 50 | 20 days supply | Qty: 60 | Fill #0

## 2016-05-28 MED FILL — predniSONE 10 MG (21) TBPK: 10 | 6 days supply | Qty: 21 | Fill #0

## 2016-05-28 NOTE — Progress Notes (Signed)
Office Visit Note   Patient: Jenny Bradford           Date of Birth: 02-Oct-1950           MRN: [id number] Visit Date: 05/28/2016              Requested by: Thressa Sheller, MD [address], [place], [place] [address] PCP: Thressa Sheller, MD   Assessment & Plan: Visit Diagnoses:  1. Pain in left foot   2. Arthritis of carpometacarpal (CMC) joint of left thumb     Plan: She is doing well from a foot standpoint. For her left thumb CMC arthritis I refilled the below mentioned medicines. Continue CMC orthotic. She will let us know if she decides to proceed with surgery. She had very little to no relief from previous injection to the right thumb. Total face to face encounter time was greater than 25 minutes and over half of this time was spent in counseling and/or coordination of care   Orders Placed This Encounter  Procedures  . XR Wrist Complete Left  . XR Foot Complete Left   Meds ordered this encounter  Medications  . traMADol (ULTRAM) 50 MG tablet    Sig: Take 1 tablet (50 mg total) by mouth 3 (three) times daily as needed.    Dispense:  60 tablet    Refill:  5  . diclofenac sodium (VOLTAREN) 1 % GEL    Sig: Apply 2 g topically 4 (four) times daily.    Dispense:  1 Tube    Refill:  5  . meloxicam (MOBIC) 7.5 MG tablet    Sig: Take 1 tablet (7.5 mg total) by mouth 2 (two) times daily as needed for pain.    Dispense:  30 tablet    Refill:  2  . predniSONE (STERAPRED UNI-PAK 21 TAB) 10 MG (21) TBPK tablet    Sig: Take as directed    Dispense:  21 tablet    Refill:  0      Procedures: No procedures performed   Clinical Data: No additional findings.   Subjective: Chief Complaint  Patient presents with  . Left Wrist - Pain  . Left Foot - Follow-up    Patient is following up today for 2 months of left thumb CMC arthritis and left foot pain. Her foot is feeling much better. She is walking with a new balance walking shoes and she is been able to  walk a mile 3 times a week. She does not have daily pain anymore. She did get a new job and now she is feeling a lot of prescriptions. She is status post right thumb CMC arthroplasty with good recovery. Her left thumb is bothering her significantly. She has been wearing a CMC orthotic.    Review of Systems  Constitutional: Negative.   HENT: Negative.   Eyes: Negative.   Respiratory: Negative.   Cardiovascular: Negative.   Endocrine: Negative.   Musculoskeletal: Negative.   Neurological: Negative.   Hematological: Negative.   Psychiatric/Behavioral: Negative.   All other systems reviewed and are negative.    Objective: Vital Signs: There were no vitals taken for this visit.  Physical Exam  Constitutional: She is oriented to person, place, and time. She appears well-developed and well-nourished.  Pulmonary/Chest: Effort normal.  Neurological: She is alert and oriented to person, place, and time.  Skin: Skin is warm. Capillary refill takes less than 2 seconds.  Psychiatric: She has a normal mood and affect. Her behavior  is normal. Judgment and thought content normal.  Nursing note and vitals reviewed.   Ortho Exam Left hand exam shows a significant crepitus of the [facility] joint with a positive grind test. Negative Finkelstein's. Left foot exam is benign. Specialty Comments:  No specialty comments available.  Imaging: Xr Foot Complete Left  Result Date: 05/28/2016 Healed stress fracture with bridging callus formation  Xr Wrist Complete Left  Result Date: 05/28/2016 Advanced degenerative joint disease of left thumb CMC joint    PMFS History: Patient Active Problem List   Diagnosis Date Noted  . Arthritis of carpometacarpal [facility]) joint of left thumb 05/28/2016  . OSA on CPAP 10/05/2012  . HTN (hypertension) 10/05/2012  . Hyperlipidemia 10/05/2012  . Migraine headache 10/05/2012   Past Medical History:  Diagnosis Date  . Arthritis    oa  . Complication of anesthesia     slow to awaken in past  . Family history of anesthesia complication    slow to awaken  . GERD (gastroesophageal reflux disease)   . H/O hiatal hernia   . Headache(784.0)    migraines  . Hyperlipidemia   . Hypertension 04/01/11   ECHO-EF>55% NUC STRESS TEST- 05/01/12  . Hypothyroidism   . PONV (postoperative nausea and vomiting)   . Sleep apnea 05/01/12 & 05/29/12   SLEEP STUDY-[place] [facility] SLEEP, NO CPAP USED SINCE MAY 2015    Family History  Problem Relation Age of Onset  . Breast cancer Mother   . Ovarian cancer Mother   . Heart attack Father   . Heart disease Father   . Hypertension Brother   . Hyperlipidemia Maternal Grandmother   . Hypertension Maternal Grandmother     Past Surgical History:  Procedure Laterality Date  . ANKLE SURGERY Right 1980  . CARPOMETACARPEL SUSPENSION PLASTY Right 12/14/2014   Procedure: RIGHT THUMB CARPOMETACARPAL ([place]) ARTHROPLASTY;  Surgeon: Leandrew Koyanagi, MD;  Location: [facility];  Service: Orthopedics;  Laterality: Right;  . COLONOSCOPY WITH PROPOFOL N/A 12/07/2013   Procedure: COLONOSCOPY WITH PROPOFOL;  Surgeon: Garlan Fair, MD;  [facility];  Service: Endoscopy;  Laterality: N/A;  . CYSTECTOMY  1975  . FOOT SURGERY Right 9.23.14  . FOOT SURGERY Left 10.01.13  . [place]  2005  . [place]  . TONSILLECTOMY  1964   Social History   Occupational History  . Not on file.   Social History Main Topics  . Smoking status: Never Smoker  . Smokeless tobacco: Never Used  . Alcohol use No  . Drug use: No  . Sexual activity: Not on file

## 2016-06-17 MED FILL — PRAVASTATIN NA 40 MG TAB: 40 | 30 days supply | Qty: 30 | Fill #5

## 2016-06-17 MED FILL — LEVOTHYROXINE 50 MCG TABLET: 50 | 30 days supply | Qty: 30 | Fill #2

## 2016-06-17 MED FILL — LOSARTAN POTASSIUM 50 MG TA: 50 | 30 days supply | Qty: 45 | Fill #1

## 2016-06-17 MED FILL — HYDROCHLOROTHIAZIDE 25 MG T: 25 | 30 days supply | Qty: 30 | Fill #5

## 2016-06-17 MED FILL — ALPRAZolam 0.25 MG TABS: 0.25 | 30 days supply | Qty: 30 | Fill #1

## 2016-06-17 MED FILL — raNITIdine HCL 150 MG TABS: 150 | 30 days supply | Qty: 60 | Fill #5

## 2016-06-17 MED FILL — ESTRACE 0.01% CREAM: 0.1 | 31 days supply | Qty: 43 | Fill #3

## 2016-06-17 MED FILL — traMADol HCL 50 MG TABS: 50 | 20 days supply | Qty: 60 | Fill #1

## 2016-06-17 MED FILL — ESCITALOPRAM 20 MG TABLET: 20 | 30 days supply | Qty: 30 | Fill #5

## 2016-06-17 MED FILL — BUTALB-ACETAMIN-CAFF 50-325: 50-325-40 | 10 days supply | Qty: 60 | Fill #4

## 2016-07-01 MED FILL — NITROFURANTOIN MONO-MCR 100: 100 | 7 days supply | Qty: 14 | Fill #0

## 2016-07-10 MED FILL — CIPROFLOXACIN HCL 500 MG TA: 500 | 3 days supply | Qty: 6 | Fill #0

## 2016-07-17 ENCOUNTER — Encounter: Payer: Self-pay | Admitting: Family

## 2016-07-19 MED FILL — LEVOTHYROXINE 50 MCG TABLET: 50 | 30 days supply | Qty: 30 | Fill #3

## 2016-07-19 MED FILL — LOSARTAN POTASSIUM 50 MG TA: 50 | 30 days supply | Qty: 45 | Fill #2

## 2016-07-19 MED FILL — BUTALB-ACETAMIN-CAFF 50-325: 50-325-40 | 10 days supply | Qty: 60 | Fill #5

## 2016-07-19 MED FILL — ESTRACE 0.01% CREAM: 0.1 | 31 days supply | Qty: 43 | Fill #4

## 2016-07-19 MED FILL — traMADol HCL 50 MG TABS: 50 | 20 days supply | Qty: 60 | Fill #2

## 2016-07-22 ENCOUNTER — Other Ambulatory Visit (HOSPITAL_COMMUNITY): Payer: 59

## 2016-07-22 ENCOUNTER — Ambulatory Visit: Payer: 59 | Admitting: Family

## 2016-07-22 MED FILL — PRAVASTATIN NA 40 MG TAB: 40 | 30 days supply | Qty: 30 | Fill #0

## 2016-07-22 MED FILL — raNITIdine HCL 150 MG TABS: 150 | 30 days supply | Qty: 60 | Fill #0

## 2016-07-22 MED FILL — ESCITALOPRAM 20 MG TABLET: 20 | 30 days supply | Qty: 30 | Fill #0

## 2016-07-22 MED FILL — ALPRAZolam 0.25 MG TABS: 0.25 | 30 days supply | Qty: 30 | Fill #0

## 2016-07-22 MED FILL — HYDROCHLOROTHIAZIDE 25 MG T: 25 | 30 days supply | Qty: 30 | Fill #0

## 2016-08-22 MED FILL — ESCITALOPRAM 20 MG TABLET: 20 | 30 days supply | Qty: 30 | Fill #1

## 2016-08-22 MED FILL — PRAVASTATIN NA 40 MG TAB: 40 | 30 days supply | Qty: 30 | Fill #1

## 2016-08-22 MED FILL — raNITIdine HCL 150 MG TABS: 150 | 30 days supply | Qty: 60 | Fill #1

## 2016-08-22 MED FILL — ALPRAZolam 0.25 MG TABS: 0.25 | 30 days supply | Qty: 30 | Fill #1

## 2016-08-22 MED FILL — HYDROCHLOROTHIAZIDE 25 MG T: 25 | 30 days supply | Qty: 30 | Fill #1

## 2016-08-22 MED FILL — LOSARTAN POTASSIUM 50 MG TA: 50 | 30 days supply | Qty: 45 | Fill #3

## 2016-08-22 MED FILL — LEVOTHYROXINE 50 MCG TABLET: 50 | 30 days supply | Qty: 30 | Fill #4

## 2016-08-22 MED FILL — ESTRACE 0.01% CREAM: 0.1 | 31 days supply | Qty: 43 | Fill #5

## 2016-08-23 ENCOUNTER — Encounter: Payer: Self-pay | Admitting: Family

## 2016-09-04 ENCOUNTER — Encounter: Payer: Self-pay | Admitting: Family

## 2016-09-04 ENCOUNTER — Ambulatory Visit (HOSPITAL_COMMUNITY)
Admission: RE | Admit: 2016-09-04 | Discharge: 2016-09-04 | Disposition: A | Discharge status: Home / Self Care | Payer: 59 | Source: Ambulatory Visit | Attending: Surgery | Admitting: Surgery

## 2016-09-04 ENCOUNTER — Ambulatory Visit (INDEPENDENT_AMBULATORY_CARE_PROVIDER_SITE_OTHER): Payer: 59 | Admitting: Family

## 2016-09-04 VITALS — BP 138/77 | HR 58 | Temp 97.0°F | Resp 14 | Ht 63.0 in | Wt 151.0 lb

## 2016-09-04 DIAGNOSIS — I714 Abdominal aortic aneurysm, without rupture, unspecified: Secondary | ICD-10-CM

## 2016-09-04 NOTE — Progress Notes (Signed)
[facility] [place]   CC: Follow up Abdominal Aortic Aneurysm  History of Present Illness  Jenny Bradford is a 66 y.o. (1950-05-29) female returns today for 2 year follow up of her small AAA. This was initially detected on an ultrasound.  Maximum diameter was 2.3 cm.  She does have a significant family history of aneurysms in her father and aunt.   She is medically managed for hypertension which has been fairly well controlled with systolics in the 101B.  She does take an ARB.  She is medically managed for hypercholesterolemia with a statin.  She is a nonsmoker. Dr. Trula Slade indicated in his last evaluation on 07-22-14 that he would like to see the pt keep her systolic blood pressure less than 130.   Pt states her blood pressure two weeks ago was 120/70.   Pt Diabetic: No Pt smoker: non-smoker  Pt meds include: Statin :Yes Betablocker: No, she cannot take due to exercise intolerance  ASA: No, states she has IBS, she did take in the past, states it did not cause her any issues Other anticoagulants/antiplatelets: no    Past Medical History:  Diagnosis Date  . Arthritis    oa  . Complication of anesthesia    slow to awaken in past  . Family history of anesthesia complication    slow to awaken  . GERD (gastroesophageal reflux disease)   . H/O hiatal hernia   . Headache(784.0)    migraines  . Hyperlipidemia   . Hypertension 04/01/11   ECHO-EF>55% NUC STRESS TEST- 05/01/12  . Hypothyroidism   . PONV (postoperative nausea and vomiting)   . Sleep apnea 05/01/12 & 05/29/12   SLEEP STUDY- HEART AND SLEEP, NO CPAP USED SINCE MAY 2015   Past Surgical History:  Procedure Laterality Date  . ANKLE SURGERY Right 1980  . CARPOMETACARPEL SUSPENSION PLASTY Right 12/14/2014   Procedure: RIGHT THUMB CARPOMETACARPAL ([place]) ARTHROPLASTY;  Surgeon: Leandrew Koyanagi, MD;  Location: [facility];  Service: Orthopedics;  Laterality: Right;  . COLONOSCOPY WITH  PROPOFOL N/A 12/07/2013   Procedure: COLONOSCOPY WITH PROPOFOL;  Surgeon: Garlan Fair, MD;  Location: [facility];  Service: Endoscopy;  Laterality: N/A;  . CYSTECTOMY  1975  . FOOT SURGERY Right 9.23.14  . FOOT SURGERY Left 10.01.13  . [place]  2005  . [place]  . TONSILLECTOMY  1964   Social History Social History   Social History  . Marital status: Married    Spouse name: N/A  . Number of children: N/A  . Years of education: N/A   Occupational History  . Not on file.   Social History Main Topics  . Smoking status: Never Smoker  . Smokeless tobacco: Never Used  . Alcohol use No  . Drug use: No  . Sexual activity: Not on file   Other Topics Concern  . Not on file   Social History Narrative  . No narrative on file   Family History Family History  Problem Relation Age of Onset  . Breast cancer Mother   . Ovarian cancer Mother   . Heart attack Father   . Heart disease Father   . Hypertension Brother   . Hyperlipidemia Maternal Grandmother   . Hypertension Maternal Grandmother     Current Outpatient Prescriptions on File Prior to Visit  Medication Sig Dispense Refill  . acetaminophen (TYLENOL) 500 MG tablet Take 500 mg by mouth 2 (two) times daily.    Marland Kitchen ALPRAZolam (  XANAX) 0.25 MG tablet Take 0.25 mg by mouth at bedtime as needed for sleep.     . butalbital-acetaminophen-caffeine (FIORICET, ESGIC) 50-325-40 MG per tablet Take 1 tablet by mouth 2 (two) times daily as needed for headache.    . calcium carbonate (TUMS) 500 MG chewable tablet Chew 1 tablet by mouth daily as needed for indigestion.     . diclofenac sodium (VOLTAREN) 1 % GEL Apply 2 g topically 4 (four) times daily. 1 Tube 5  . escitalopram (LEXAPRO) 20 MG tablet Take 20 mg by mouth every morning.     Marland Kitchen estradiol (ESTRACE) 0.1 MG/GM vaginal cream Place 2 g vaginally 2 (two) times a week. USE TWICE WEEKLY    . hydrochlorothiazide (HYDRODIURIL) 25 MG tablet Take 25 mg by mouth every  morning.     Marland Kitchen levothyroxine (SYNTHROID, LEVOTHROID) 112 MCG tablet Take 50 mcg by mouth daily before breakfast.     . losartan (COZAAR) 50 MG tablet Take 75 mg by mouth every morning.     . meloxicam (MOBIC) 7.5 MG tablet Take 1 tablet (7.5 mg total) by mouth 2 (two) times daily as needed for pain. 30 tablet 2  . pravastatin (PRAVACHOL) 40 MG tablet Take 40 mg by mouth at bedtime.     . Ranitidine HCl (ZANTAC 150 MAXIMUM STRENGTH PO) Take by mouth 2 (two) times daily.    . SUMAtriptan (IMITREX) 50 MG tablet Take 50 mg by mouth as needed for migraine.    . traMADol (ULTRAM) 50 MG tablet Take 1 tablet (50 mg total) by mouth 3 (three) times daily as needed. 60 tablet 5  . HYDROcodone-acetaminophen (NORCO) 7.5-325 MG tablet Take 1-2 tablets by mouth every 6 (six) hours as needed for moderate pain. (Patient not taking: Reported on 05/28/2016) 90 tablet 0  . predniSONE (STERAPRED UNI-PAK 21 TAB) 10 MG (21) TBPK tablet Take as directed (Patient not taking: Reported on 09/04/2016) 21 tablet 0   No current facility-administered medications on file prior to visit.    Allergies  Allergen Reactions  . Acetaminophen   . Darvon [Propoxyphene Hcl] Other (See Comments)    DIZZINESS  . Marcaine [Bupivacaine Hcl] Other (See Comments)    SYNCOPE   . Midol [Aspirin-Cinnamedrine-Caffeine] Other (See Comments)    DIZZINESS  . Propoxyphene   . Talwin [Pentazocine] Other (See Comments)    EXTREME DROWSINESS  . Naproxen Rash    ROS: See HPI for pertinent positives and negatives.  Physical Examination  Vitals:   09/04/16 0935  BP: 138/77  Pulse: (!) 58  Resp: 14  Temp: 97 F (36.1 C)  TempSrc: Oral  SpO2: 100%  Weight: 151 lb (68.5 kg)  Height: 5\' 3"  (1.6 m)   Body mass index is 26.75 kg/m.  General: A&O x 3, WD, female.  Pulmonary: Sym exp, respirations are non labored, good air movt, CTAB, no rales, rhonchi, or wheezing.  Cardiac: RRR, Nl S1, S2, no appreciable murmur.   Carotid Bruits  Right Left   Negative Negative    Adominal aortic pulse is not palpable Radial pulses are 2+                          VASCULAR EXAM:  LE Pulses Right Left       POPLITEAL  not palpable   not palpable       POSTERIOR TIBIAL  2+ palpable   2+ palpable        DORSALIS PEDIS      ANTERIOR TIBIAL faintly palpable  1+ palpable     Gastrointestinal: soft, NTND, -G/R, - HSM, - masses palpated, - CVAT B.  Musculoskeletal: M/S 5/5 throughout , Extremities without ischemic changes.  Neurologic: CN 2-12 intact, Pain and light touch intact in extremities are intact, Motor exam as listed above.  Non-Invasive Vascular Imaging  AAA Duplex (09/04/2016)  Previous size: 2.3 cm (Date: 06-29-14)  Current size:  2.3 cm (Date: 09-04-16); Right CIA: 1.1 cm; Left CIA: 1.0 cm  Medical Decision Making  The patient is a 66 y.o. female who presents with a small asymptomatic AAA with no increase in size.   Based on this patient's exam and diagnostic studies, the patient will follow up in 2 years with the following studies: AAA duplex.  Consideration for repair of AAA would be made when the size is 5.0 cm, growth > 1 cm/yr, and symptomatic status.        The patient was given information about AAA including signs, symptoms, treatment,       and how to minimize the risk of enlargement and rupture of aneurysms.    I emphasized the importance of maximal medical management including strict control of blood pressure, blood glucose, and lipid levels, antiplatelet agents, obtaining regular exercise, and continued cessation of smoking.   The patient was advised to call 911 should the patient experience sudden onset abdominal or back pain.   Thank you for allowing [place] to participate in this patient's care.  Clemon Chambers, RN, MSN, FNP-C [facility] [place] Office:  [phone]  Clinic Physician: Scot Dock  09/04/2016, 9:50 AM

## 2016-09-04 NOTE — Patient Instructions (Signed)
Abdominal Aortic Aneurysm Blood pumps away from the heart through tubes (blood vessels) called arteries. Aneurysms are weak or damaged places in the wall of an artery. It bulges out like a balloon. An abdominal aortic aneurysm happens in the main artery of the body (aorta). It can burst or tear, causing bleeding inside the body. This is an emergency. It needs treatment right away. What are the causes? The exact cause is unknown. Things that could cause this problem include:  Fat and other substances building up in the lining of a tube.  Swelling of the walls of a blood vessel.  Certain tissue diseases.  Belly (abdominal) trauma.  An infection in the main artery of the body.  What increases the risk? There are things that make it more likely for you to have an aneurysm. These include:  Being over the age of 64 years old.  Having high blood pressure (hypertension).  Being a female.  Being white.  Being very overweight (obese).  Having a family history of aneurysm.  Using tobacco products.  What are the signs or symptoms? Symptoms depend on the size of the aneurysm and how fast it grows. There may not be symptoms. If symptoms occur, they can include:  Pain (belly, side, lower back, or groin).  Feeling full after eating a small amount of food.  Feeling sick to your stomach (nauseous), throwing up (vomiting), or both.  Feeling a lump in your belly that feels like it is beating (pulsating).  Feeling like you will pass out (faint).  How is this treated?  Medicine to control blood pressure and pain.  Imaging tests to see if the aneurysm gets bigger.  Surgery. How is this prevented? To lessen your chance of getting this condition:  Stop smoking. Stop chewing tobacco.  Limit or avoid alcohol.  Keep your blood pressure, blood sugar, and cholesterol within normal limits.  Eat less salt.  Eat foods low in saturated fats and cholesterol. These are found in animal and  whole dairy products.  Eat more fiber. Fiber is found in whole grains, vegetables, and fruits.  Keep a healthy weight.  Stay active and exercise often.  This information is not intended to replace advice given to you by your health care provider. Make sure you discuss any questions you have with your health care provider. Document Released: 06/22/2012 Document Revised: 08/03/2015 Document Reviewed: 03/27/2012 Elsevier Interactive Patient Education  2017 Elsevier Inc.  

## 2016-09-19 ENCOUNTER — Ambulatory Visit (INDEPENDENT_AMBULATORY_CARE_PROVIDER_SITE_OTHER): Payer: 59 | Admitting: Orthopaedic Surgery

## 2016-09-21 MED FILL — ESCITALOPRAM 20 MG TABLET: 20 | 30 days supply | Qty: 30 | Fill #2

## 2016-09-21 MED FILL — PRAVASTATIN NA 40 MG TAB: 40 | 30 days supply | Qty: 30 | Fill #2

## 2016-09-21 MED FILL — LEVOTHYROXINE 50 MCG TABLET: 50 | 30 days supply | Qty: 30 | Fill #5

## 2016-09-21 MED FILL — ALPRAZolam 0.25 MG TABS: 0.25 | 30 days supply | Qty: 30 | Fill #2

## 2016-09-21 MED FILL — LOSARTAN POTASSIUM 50 MG TA: 50 | 30 days supply | Qty: 45 | Fill #4

## 2016-09-21 MED FILL — HYDROCHLOROTHIAZIDE 25 MG T: 25 | 30 days supply | Qty: 30 | Fill #2

## 2016-09-21 MED FILL — raNITIdine HCL 150 MG TABS: 150 | 30 days supply | Qty: 60 | Fill #2

## 2016-09-23 MED FILL — ESTRACE 0.01% CREAM: 0.1 | 31 days supply | Qty: 43 | Fill #6

## 2016-09-23 MED FILL — MUPIROCIN 2% CREAM: 2 | 10 days supply | Qty: 15 | Fill #2

## 2016-10-03 ENCOUNTER — Other Ambulatory Visit: Payer: Self-pay | Admitting: Obstetrics & Gynecology

## 2016-10-03 DIAGNOSIS — Z1231 Encounter for screening mammogram for malignant neoplasm of breast: Secondary | ICD-10-CM

## 2016-10-19 MED FILL — ESCITALOPRAM 20 MG TABLET: 20 | 30 days supply | Qty: 30 | Fill #3

## 2016-10-19 MED FILL — HYDROCHLOROTHIAZIDE 25 MG T: 25 | 30 days supply | Qty: 30 | Fill #3

## 2016-10-19 MED FILL — PRAVASTATIN NA 40 MG TAB: 40 | 30 days supply | Qty: 30 | Fill #3

## 2016-10-19 MED FILL — LOSARTAN POTASSIUM 50 MG TA: 50 | 30 days supply | Qty: 45 | Fill #5

## 2016-10-21 MED FILL — raNITIdine HCL 150 MG TABS: 150 | 30 days supply | Qty: 60 | Fill #3

## 2016-10-21 MED FILL — LEVOTHYROXINE 50 MCG TABLET: 50 | 30 days supply | Qty: 30 | Fill #0

## 2016-10-21 MED FILL — ESTRACE 0.01% CREAM: 0.1 | 31 days supply | Qty: 43 | Fill #7

## 2016-10-21 MED FILL — MUPIROCIN 2% CREAM: 2 | 10 days supply | Qty: 15 | Fill #3

## 2016-10-22 MED FILL — ALPRAZolam 0.25 MG TABS: 0.25 | 30 days supply | Qty: 30 | Fill #0

## 2016-11-07 ENCOUNTER — Ambulatory Visit
Admission: RE | Admit: 2016-11-07 | Discharge: 2016-11-07 | Disposition: A | Discharge status: Home / Self Care | Payer: 59 | Source: Ambulatory Visit | Attending: Obstetrics & Gynecology | Admitting: Obstetrics & Gynecology

## 2016-11-07 DIAGNOSIS — Z1231 Encounter for screening mammogram for malignant neoplasm of breast: Secondary | ICD-10-CM

## 2016-11-18 MED FILL — SUMATRIPTAN SUCC 100 MG TAB: 100 | 15 days supply | Qty: 9 | Fill #0

## 2016-11-18 MED FILL — MUPIROCIN 2% CREAM: 2 | 10 days supply | Qty: 15 | Fill #4

## 2016-11-18 MED FILL — LOSARTAN POTASSIUM 50 MG TA: 50 | 30 days supply | Qty: 45 | Fill #0

## 2016-11-18 MED FILL — ESTRACE 0.01% CREAM: 0.1 | 31 days supply | Qty: 43 | Fill #8

## 2016-11-18 MED FILL — ESCITALOPRAM 20 MG TABLET: 20 | 30 days supply | Qty: 30 | Fill #4

## 2016-11-18 MED FILL — PANTOPRAZOLE SOD DR 40 MG T: 40 | 30 days supply | Qty: 30 | Fill #0

## 2016-11-18 MED FILL — BUTALB-ACETAMIN-CAFF 50-325: 50-325-40 | 20 days supply | Qty: 60 | Fill #0

## 2016-11-18 MED FILL — LEVOTHYROXINE 50 MCG TABLET: 50 | 30 days supply | Qty: 30 | Fill #1

## 2016-11-18 MED FILL — DOXYCYCLINE HYC 100 MG TAB: 100 | 7 days supply | Qty: 14 | Fill #2

## 2016-11-18 MED FILL — HYDROCHLOROTHIAZIDE 25 MG T: 25 | 30 days supply | Qty: 30 | Fill #4

## 2016-11-18 MED FILL — PRAVASTATIN NA 40 MG TAB: 40 | 30 days supply | Qty: 30 | Fill #4

## 2016-11-18 MED FILL — traMADol HCL 50 MG TABS: 50 | 20 days supply | Qty: 60 | Fill #3

## 2016-11-19 MED FILL — CARISOPRODOL 350 MG TABLET: 350 | 30 days supply | Qty: 60 | Fill #0

## 2016-11-29 ENCOUNTER — Other Ambulatory Visit: Payer: Self-pay | Admitting: Internal Medicine

## 2016-11-29 DIAGNOSIS — R222 Localized swelling, mass and lump, trunk: Secondary | ICD-10-CM

## 2016-12-05 ENCOUNTER — Ambulatory Visit
Admission: RE | Admit: 2016-12-05 | Discharge: 2016-12-05 | Disposition: A | Discharge status: Home / Self Care | Payer: 59 | Source: Ambulatory Visit | Attending: Internal Medicine | Admitting: Internal Medicine

## 2016-12-05 DIAGNOSIS — R222 Localized swelling, mass and lump, trunk: Secondary | ICD-10-CM

## 2016-12-05 MED ORDER — IOPAMIDOL (ISOVUE-300) INJECTION 61%
75.0000 mL | Freq: Once | INTRAVENOUS | Status: AC | PRN
  Administered 2016-12-05: 75 mL via INTRAVENOUS

## 2016-12-19 MED FILL — LEVOTHYROXINE 50 MCG TABLET: 50 | 30 days supply | Qty: 30 | Fill #2

## 2016-12-19 MED FILL — PRAVASTATIN NA 40 MG TAB: 40 | 30 days supply | Qty: 30 | Fill #5

## 2016-12-19 MED FILL — LOSARTAN POTASSIUM 50 MG TA: 50 | 30 days supply | Qty: 45 | Fill #1

## 2016-12-19 MED FILL — PANTOPRAZOLE SOD DR 40 MG T: 40 | 30 days supply | Qty: 30 | Fill #1

## 2016-12-19 MED FILL — HYDROCHLOROTHIAZIDE 25 MG T: 25 | 30 days supply | Qty: 30 | Fill #5

## 2016-12-19 MED FILL — ESCITALOPRAM 20 MG TABLET: 20 | 30 days supply | Qty: 30 | Fill #5

## 2016-12-19 MED FILL — ALPRAZolam 0.25 MG TABS: 0.25 | 30 days supply | Qty: 30 | Fill #0

## 2016-12-20 MED FILL — ESTRACE 0.01% CREAM: 0.1 | 30 days supply | Qty: 43 | Fill #0

## 2016-12-25 ENCOUNTER — Telehealth (INDEPENDENT_AMBULATORY_CARE_PROVIDER_SITE_OTHER): Payer: Self-pay | Admitting: Orthopaedic Surgery

## 2016-12-25 NOTE — Telephone Encounter (Signed)
I cannot

## 2016-12-25 NOTE — Telephone Encounter (Signed)
Are you able to see this?

## 2016-12-25 NOTE — Telephone Encounter (Signed)
Advised patient she will have to Get images burned onto a CD and can bring them to her appt or before her appt

## 2016-12-25 NOTE — Telephone Encounter (Signed)
Patient had neck x-ray at [facility] on 11/26/16. She wanted to make sure Dr Erlinda Hong received the images and looked at them before her appt. Please give her a call back at [phone]. Can leave voicemail message.

## 2016-12-27 ENCOUNTER — Ambulatory Visit (INDEPENDENT_AMBULATORY_CARE_PROVIDER_SITE_OTHER): Payer: 59 | Admitting: Internal Medicine

## 2016-12-27 ENCOUNTER — Encounter: Payer: Self-pay | Admitting: Internal Medicine

## 2016-12-27 VITALS — BP 128/84 | HR 60 | Ht 63.0 in | Wt 163.4 lb

## 2016-12-27 DIAGNOSIS — R918 Other nonspecific abnormal finding of lung field: Secondary | ICD-10-CM | POA: Insufficient documentation

## 2016-12-27 DIAGNOSIS — K449 Diaphragmatic hernia without obstruction or gangrene: Secondary | ICD-10-CM | POA: Diagnosis not present

## 2016-12-27 NOTE — Patient Instructions (Addendum)
  You have a very large Hiatal hernia which you should discuss with your GI doctor  Remember the 6 motions to stretch your neck muscles hold it 20 seconds each direction and repeat   We will contact you for a follow up CT scan in six months

## 2016-12-27 NOTE — Progress Notes (Addendum)
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 Pulmonary Consult    Referred by Dr. Roseanne Reno for eval of incidental pulmonary nodule.   her only resp issue is spring = fall watery rhinitis and nasal congestion controlled with otcs Onset of neck pain early dec post r > L positional s radicular features > CT Neck > dx mpn's > confirmed on CT chest with huge HH- see below - does have issues with hb but usually just takes otcs to control   Not limited by breathing from desired activities    No obvious day to day or daytime variability or assoc excess/ purulent sputum or mucus plugs or hemoptysis or cp or chest tightness, subjective wheeze or overt sinus   symptoms. No unusual exp hx or h/o childhood pna/ asthma or knowledge of premature birth.  Sleeping ok flat without nocturnal  or early am exacerbation  of respiratory  c/o's or need for noct saba. Also denies any obvious fluctuation of symptoms with weather or environmental changes or other aggravating or alleviating factors except as outlined above   Current Allergies, Complete Past Medical History, Past Surgical History, Family History, and Social History were reviewed in [employer] record.  ROS  The following are not active complaints unless bolded Hoarseness, sore throat, dysphagia, dental problems, itching, sneezing,  nasal congestion or discharge of excess mucus or purulent secretions, ear ache,   fever, chills, sweats, unintended wt loss or wt gain, classically pleuritic or exertional cp,  orthopnea pnd or leg swelling, presyncope,  palpitations, abdominal pain, anorexia, nausea, vomiting, diarrhea  or change in bowel habits or change in bladder habits, change in stools or change in urine, dysuria, hematuria,  rash, arthralgias, visual complaints, headache, numbness, weakness or ataxia or problems with walking or coordination,  change in mood/affect or memory.        Current Meds  Medication Sig  . acetaminophen (TYLENOL) 500 MG tablet Take 500 mg by mouth 2 (two) times daily.  Marland Kitchen ALPRAZolam (XANAX) 0.25 MG tablet Take 0.25 mg by mouth at bedtime as needed for sleep.   . butalbital-acetaminophen-caffeine (FIORICET, ESGIC) 50-325-40 MG per tablet Take 1 tablet by mouth 2 (two) times daily as needed for headache.  . calcium carbonate (TUMS) 500 MG chewable tablet Chew 1 tablet by mouth daily as needed for indigestion.   . cholecalciferol (VITAMIN D) 1000 units tablet Take 2,000 Units by mouth daily.  Marland Kitchen escitalopram (LEXAPRO) 20 MG tablet Take 20 mg by mouth every morning.   Marland Kitchen estradiol (ESTRACE) 0.1 MG/GM vaginal cream Place 2 g vaginally 2 (two) times a week. USE TWICE WEEKLY  . hydrochlorothiazide (HYDRODIURIL) 25 MG tablet Take 25 mg by mouth every morning.   Marland Kitchen ibuprofen (ADVIL,MOTRIN) 200 MG tablet Take 200 mg by mouth every 6 (six) hours as needed.  Marland Kitchen levothyroxine (SYNTHROID, LEVOTHROID) 50 MCG tablet Take 50 mcg by mouth daily before breakfast.  . losartan (COZAAR) 50 MG tablet Take 75 mg by mouth every morning.   . pravastatin (PRAVACHOL) 40  MG tablet Take 40 mg by mouth at bedtime.   . Ranitidine HCl (ZANTAC 150 MAXIMUM STRENGTH PO) Take by mouth 2 (two) times daily.  . SUMAtriptan (IMITREX) 50 MG tablet Take 50 mg by mouth as needed for migraine.  . traMADol (ULTRAM) 50 MG tablet Take 50 mg by mouth every 6 (six) hours as needed.  . triamcinolone (NASACORT) 55 MCG/ACT AERO nasal inhaler Place 2 sprays into the nose daily as needed.       Review of Systems     Objective:   Physical Exam     amb pleasant wf  nad   Wt Readings from Last 3 Encounters:  12/27/16 163 lb 6.4 oz (74.1 kg)  09/04/16 151 lb (68.5 kg)  12/14/14 165 lb 2 oz (74.9 kg)    Vital signs reviewed  - Note on arrival 02 sats  98% on RA     .HEENT: nl dentition, turbinates bilaterally, and oropharynx. Nl external ear canals without cough reflex   NECK :  without JVD/Nodes/TM/ nl carotid upstrokes bilaterally - pain elicited on rotation/ tilting to L    LUNGS: no acc muscle use,  Nl contour chest which is clear to A and P bilaterally without cough on insp or exp maneuvers   CV:  RRR  no s3 or murmur or increase in P2, and no edema   ABD:  soft and nontender with nl inspiratory excursion in the supine position. No bruits or organomegaly appreciated, bowel sounds nl  MS:  Nl gait/ ext warm without deformities, calf tenderness, cyanosis or clubbing No obvious joint restrictions   SKIN: warm and dry without lesions    NEURO:  alert, approp, nl sensorium with  no motor or cerebellar deficits apparent.       I personally reviewed images and agree with radiology impression as follows:   Chest CT    12/05/16 No acute cardiopulmonary disease. No evidence of right paratracheal or mediastinal mass. 1.3 cm ground-glass nodular focus of opacification over the left upper lobe. 3 mm nodule density over the left upper lobe as well as 3 mm  lateral right upper lobe. Recommend followup CT 6 months. Large hiatal hernia.    Assessment:

## 2016-12-28 DIAGNOSIS — K449 Diaphragmatic hernia without obstruction or gangrene: Secondary | ICD-10-CM | POA: Insufficient documentation

## 2016-12-28 NOTE — Assessment & Plan Note (Signed)
It appears to me that a large portion of her stomach and perhaps other contents are above the diaphragm though the only symptoms she has attributable to this are related to mild gerd > rec discuss surgical opinion if her GI doctor agrees though no immediate concern as this is likely very longstanding

## 2016-12-28 NOTE — Assessment & Plan Note (Addendum)
Chest CT    12/05/16 No acute cardiopulmonary disease. No evidence of right paratracheal or mediastinal mass. 1.3 cm ground-glass nodular focus of opacification over the left upper lobe. 3 mm nodule density over the left upper lobe as well as 56mm  lateral right upper lobe. Recommend followup CT 6 months.> placed in reminder file for 06/04/17   CT results reviewed with pt  >>> Too small for PET or bx, not suspicious enough for excisional bx > really only option for now is follow the Fleischner society guidelines as rec by radiology.   Advised she is very low risk on the 49mm nodules but the gg change is the one we can't be sure about and should follow the guidelines established - of course we can see her sooner if any resp symptoms develop over the next year  Discussed in detail all the  indications, usual  risks and alternatives  relative to the benefits with patient who agrees to proceed with conservative f/u as outlined    Total time devoted to counseling  > 50 % of initial 45 min office visit:  review case with pt/ discussion of options/alternatives/ personally creating written customized instructions  in presence of pt  then going over those specific  Instructions directly with the pt including how to use all of the meds but in particular covering each new medication in detail and the difference between the maintenance= "automatic" meds and the prns using an action plan format for the latter (If this problem/symptom => do that organization reading Left to right).  Please see AVS from this visit for a full list of these instructions which I personally wrote for this pt and  are unique to this visit.

## 2017-01-03 ENCOUNTER — Ambulatory Visit (INDEPENDENT_AMBULATORY_CARE_PROVIDER_SITE_OTHER): Payer: 59

## 2017-01-03 ENCOUNTER — Ambulatory Visit (INDEPENDENT_AMBULATORY_CARE_PROVIDER_SITE_OTHER): Payer: 59 | Admitting: Orthopaedic Surgery

## 2017-01-03 DIAGNOSIS — M542 Cervicalgia: Secondary | ICD-10-CM

## 2017-01-03 DIAGNOSIS — M1812 Unilateral primary osteoarthritis of first carpometacarpal joint, left hand: Secondary | ICD-10-CM

## 2017-01-03 MED ORDER — DICLOFENAC SODIUM 1 % TD GEL: 2.0000 g | Freq: Four times a day (QID) | TRANSDERMAL | 5 refills | Status: DC

## 2017-01-03 MED ORDER — TRAMADOL HCL 50 MG PO TABS: 50.0000 mg | ORAL_TABLET | Freq: Three times a day (TID) | ORAL | 2 refills | Status: DC | PRN

## 2017-01-03 MED FILL — VOLTAREN 1% GEL: 1 | 30 days supply | Qty: 100 | Fill #0

## 2017-01-03 NOTE — Progress Notes (Signed)
Office Visit Note   Patient: Jenny Bradford           Date of Birth: 1950/03/15           MRN: [id number] Visit Date: 01/03/2017              Requested by: Thressa Sheller, [address], [place], [place] [id number] PCP: Thressa Sheller, MD   Assessment & Plan: Visit Diagnoses:  1. Neck pain   2. Primary osteoarthritis of first carpometacarpal joint of left hand     Plan: Impression is right neck and trapezius strain and left thumb basal joint arthritis.  In terms of the neck I am giving her a referral to physical therapy.  Prescription for diclofenac and tramadol as needed.  For the left hand she will give [place] a call when she is ready to undergo the surgery.  Questions encouraged and answered. Total face to face encounter time was greater than 25 minutes and over half of this time was spent in counseling and/or coordination of care.  Follow-Up Instructions: Return if symptoms worsen or fail to improve.   Orders:  Orders Placed This Encounter  Procedures  . XR Cervical Spine 2 or 3 views  . XR Hand Complete Left   Meds ordered this encounter  Medications  . diclofenac sodium (VOLTAREN) 1 % GEL    Sig: Apply 2 g topically 4 (four) times daily.    Dispense:  1 Tube    Refill:  5  . traMADol (ULTRAM) 50 MG tablet    Sig: Take 1-2 tablets (50-100 mg total) by mouth 3 (three) times daily as needed.    Dispense:  30 tablet    Refill:  2      Procedures: No procedures performed   Clinical Data: No additional findings.   Subjective: No chief complaint on file.   Patient comes in today for 2 separate issues.  She has right neck and shoulder pain in the trapezius region that is worse with flexion of the neck.  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  She essentially looks down for about 8 hours.  This really bothers her.  She denies any true radicular symptoms.  Denies any numbness and tingling.  She also is complaining of worsening pain of  her left thumb basal joint.  She has done well from her right thumb CMC arthroplasty.  She is wearing a thumb spica brace and is likely going to elect to have surgery in early 2019.    Review of Systems  Constitutional: Negative.   HENT: Negative.   Eyes: Negative.   Respiratory: Negative.   Cardiovascular: Negative.   Endocrine: Negative.   Musculoskeletal: Negative.   Neurological: Negative.   Hematological: Negative.   Psychiatric/Behavioral: Negative.   All other systems reviewed and are negative.    Objective: Vital Signs: There were no vitals taken for this visit.  Physical Exam  Constitutional: She is oriented to person, place, and time. She appears well-developed and well-nourished.  HENT:  Head: Normocephalic and atraumatic.  Eyes: EOM are normal.  Neck: Neck supple.  Pulmonary/Chest: Effort normal.  Abdominal: Soft.  Neurological: She is alert and oriented to person, place, and time.  Skin: Skin is warm. Capillary refill takes less than 2 seconds.  Psychiatric: She has a normal mood and affect. Her behavior is normal. Judgment and thought content normal.  Nursing note and vitals reviewed.   Ortho Exam Right neck and shoulder  exam shows negative Spurling's test.  She has no focal findings on the shoulder exam.  She is mildly tender in the region of the trapezius.  Left hand exam shows a positive grind test.  No triggering of the thumb. Specialty Comments:  No specialty comments available.  Imaging: Xr Cervical Spine 2 Or 3 Views  Result Date: 01/03/2017 Cervical spondylosis with moderate to severe degenerative disc disease  Xr Hand Complete Left  Result Date: 01/03/2017 Advanced degenerative joint disease of left thumb basal joint    PMFS History: Patient Active Problem List   Diagnosis Date Noted  . Neck pain 01/03/2017  . Primary osteoarthritis of first carpometacarpal joint of left hand 01/03/2017  . Large hiatal hernia 12/28/2016  . Multiple  pulmonary nodules determined by computed tomography of lung 12/27/2016  . Arthritis of carpometacarpal [facility]) joint of left thumb 05/28/2016  . OSA on CPAP 10/05/2012  . HTN (hypertension) 10/05/2012  . Hyperlipidemia 10/05/2012  . Migraine headache 10/05/2012   Past Medical History:  Diagnosis Date  . Arthritis    oa  . Complication of anesthesia    slow to awaken in past  . Family history of anesthesia complication    slow to awaken  . GERD (gastroesophageal reflux disease)   . H/O hiatal hernia   . Headache(784.0)    migraines  . Hyperlipidemia   . Hypertension 04/01/11   ECHO-EF>55% NUC STRESS TEST- 05/01/12  . Hypothyroidism   . PONV (postoperative nausea and vomiting)   . Sleep apnea 05/01/12 & 05/29/12   SLEEP STUDY-[place] [facility] AND SLEEP, NO CPAP USED SINCE MAY 2015    Family History  Problem Relation Age of Onset  . Breast cancer Mother   . Ovarian cancer Mother   . Heart attack Father   . Heart disease Father   . Hypertension Brother   . Hyperlipidemia Maternal Grandmother   . Hypertension Maternal Grandmother     Past Surgical History:  Procedure Laterality Date  . ANKLE SURGERY Right 1980  . CARPOMETACARPEL SUSPENSION PLASTY Right 12/14/2014   Procedure: RIGHT THUMB CARPOMETACARPAL ([place]) ARTHROPLASTY;  Surgeon: Leandrew Koyanagi, MD;  Location: [place];  Service: Orthopedics;  Laterality: Right;  . COLONOSCOPY WITH PROPOFOL N/A 12/07/2013   Procedure: COLONOSCOPY WITH PROPOFOL;  Surgeon: Garlan Fair, MD;  Location: [facility];  Service: Endoscopy;  Laterality: N/A;  . CYSTECTOMY  1975  . FOOT SURGERY Right 9.23.14  . FOOT SURGERY Left 10.01.13  . [place]  2005  . [place]  . TONSILLECTOMY  1964   Social History   Occupational History  . Not on file.   Social History Main Topics  . Smoking status: Never Smoker  . Smokeless tobacco: Never Used  . Alcohol use No  . Drug use: No  . Sexual activity: Not on file

## 2017-01-13 ENCOUNTER — Ambulatory Visit (INDEPENDENT_AMBULATORY_CARE_PROVIDER_SITE_OTHER): Payer: 59 | Admitting: Orthopaedic Surgery

## 2017-01-14 MED FILL — traMADol HCL 50 MG TABS: 50 | 5 days supply | Qty: 30 | Fill #0

## 2017-01-17 MED FILL — LOSARTAN POTASSIUM 50 MG TA: 50 | 30 days supply | Qty: 45 | Fill #2

## 2017-01-17 MED FILL — ESTRACE 0.01% CREAM: 0.1 | 30 days supply | Qty: 43 | Fill #1

## 2017-01-17 MED FILL — PANTOPRAZOLE SOD DR 40 MG T: 40 | 30 days supply | Qty: 30 | Fill #2

## 2017-01-21 MED FILL — PRAVASTATIN NA 40 MG TAB: 40 | 30 days supply | Qty: 30 | Fill #0

## 2017-01-21 MED FILL — ESCITALOPRAM 20 MG TABLET: 20 | 30 days supply | Qty: 30 | Fill #0

## 2017-01-21 MED FILL — ALPRAZolam 0.25 MG TABS: 0.25 | 30 days supply | Qty: 30 | Fill #0

## 2017-01-21 MED FILL — HYDROCHLOROTHIAZIDE 25 MG T: 25 | 30 days supply | Qty: 30 | Fill #0

## 2017-01-21 MED FILL — LEVOTHYROXINE 50 MCG TABLET: 50 | 30 days supply | Qty: 30 | Fill #0

## 2017-02-04 ENCOUNTER — Telehealth (INDEPENDENT_AMBULATORY_CARE_PROVIDER_SITE_OTHER): Payer: Self-pay

## 2017-02-04 NOTE — Telephone Encounter (Signed)
Patient left voice mail, wants to schedule surgery for January.  Please write surgery sheet.

## 2017-02-06 ENCOUNTER — Other Ambulatory Visit: Payer: Self-pay | Admitting: Gastroenterology

## 2017-02-06 DIAGNOSIS — K219 Gastro-esophageal reflux disease without esophagitis: Secondary | ICD-10-CM

## 2017-02-06 DIAGNOSIS — R079 Chest pain, unspecified: Secondary | ICD-10-CM

## 2017-02-12 MED FILL — PANTOPRAZOLE SOD DR 40 MG T: 40 | 30 days supply | Qty: 30 | Fill #3

## 2017-02-12 MED FILL — PENICILLIN VK 500 MG TABLET: 500 | 7 days supply | Qty: 28 | Fill #0

## 2017-02-14 MED FILL — LOSARTAN POTASSIUM 50 MG TA: 50 | 30 days supply | Qty: 45 | Fill #0

## 2017-02-19 MED FILL — CLINDAMYCIN HCL 300 MG CAPS: 300 | 10 days supply | Qty: 30 | Fill #0

## 2017-02-23 MED FILL — PRAVASTATIN NA 40 MG TAB: 40 | 30 days supply | Qty: 30 | Fill #1

## 2017-02-23 MED FILL — ESCITALOPRAM 20 MG TABLET: 20 | 30 days supply | Qty: 30 | Fill #1

## 2017-02-23 MED FILL — HYDROCHLOROTHIAZIDE 25 MG T: 25 | 30 days supply | Qty: 30 | Fill #1

## 2017-02-23 MED FILL — LEVOTHYROXINE 50 MCG TABLET: 50 | 30 days supply | Qty: 30 | Fill #1

## 2017-02-24 MED FILL — SUMATRIPTAN SUCC 100 MG TAB: 100 | 30 days supply | Qty: 9 | Fill #0

## 2017-02-24 MED FILL — traMADol HCL 50 MG TABS: 50 | 5 days supply | Qty: 30 | Fill #1

## 2017-02-28 MED FILL — ALPRAZolam 0.25 MG TABS: 0.25 | 30 days supply | Qty: 30 | Fill #0

## 2017-03-05 MED FILL — FLUCONAZOLE 150 MG TABLET: 150 | 3 days supply | Qty: 2 | Fill #0

## 2017-03-05 MED FILL — CLINDAMYCIN HCL 300 MG CAPS: 300 | 10 days supply | Qty: 30 | Fill #1

## 2017-03-08 ENCOUNTER — Encounter (INDEPENDENT_AMBULATORY_CARE_PROVIDER_SITE_OTHER): Payer: Self-pay | Admitting: Orthopaedic Surgery

## 2017-03-10 NOTE — Telephone Encounter (Signed)
Can you address this please.  Thanks.

## 2017-03-11 HISTORY — PX: HERNIA REPAIR: SHX51

## 2017-03-13 ENCOUNTER — Encounter (HOSPITAL_BASED_OUTPATIENT_CLINIC_OR_DEPARTMENT_OTHER)
Admission: RE | Admit: 2017-03-13 | Discharge: 2017-03-13 | Disposition: A | Discharge status: Home / Self Care | Payer: 59 | Source: Ambulatory Visit | Attending: Orthopaedic Surgery | Admitting: Orthopaedic Surgery

## 2017-03-13 ENCOUNTER — Encounter (HOSPITAL_BASED_OUTPATIENT_CLINIC_OR_DEPARTMENT_OTHER): Payer: Self-pay | Admitting: *Deleted

## 2017-03-13 MED FILL — PANTOPRAZOLE SOD DR 40 MG T: 40 | 30 days supply | Qty: 30 | Fill #4

## 2017-03-13 MED FILL — ESTRACE 0.01% CREAM: 0.1 | 30 days supply | Qty: 43 | Fill #2

## 2017-03-13 MED FILL — LOSARTAN POTASSIUM 50 MG TA: 50 | 30 days supply | Qty: 45 | Fill #1

## 2017-03-14 ENCOUNTER — Encounter (HOSPITAL_BASED_OUTPATIENT_CLINIC_OR_DEPARTMENT_OTHER)
Admission: RE | Admit: 2017-03-14 | Discharge: 2017-03-14 | Disposition: A | Discharge status: Home / Self Care | Payer: 59 | Source: Ambulatory Visit | Attending: Orthopaedic Surgery | Admitting: Orthopaedic Surgery

## 2017-03-14 DIAGNOSIS — K219 Gastro-esophageal reflux disease without esophagitis: Secondary | ICD-10-CM | POA: Insufficient documentation

## 2017-03-14 DIAGNOSIS — Z79899 Other long term (current) drug therapy: Secondary | ICD-10-CM | POA: Insufficient documentation

## 2017-03-14 DIAGNOSIS — G4730 Sleep apnea, unspecified: Secondary | ICD-10-CM | POA: Diagnosis not present

## 2017-03-14 DIAGNOSIS — Z01812 Encounter for preprocedural laboratory examination: Secondary | ICD-10-CM | POA: Insufficient documentation

## 2017-03-14 DIAGNOSIS — I10 Essential (primary) hypertension: Secondary | ICD-10-CM | POA: Diagnosis not present

## 2017-03-14 DIAGNOSIS — R001 Bradycardia, unspecified: Secondary | ICD-10-CM | POA: Diagnosis not present

## 2017-03-14 DIAGNOSIS — Z01810 Encounter for preprocedural cardiovascular examination: Secondary | ICD-10-CM | POA: Diagnosis present

## 2017-03-14 LAB — BASIC METABOLIC PANEL
Anion gap: 5 (ref 5–15)
BUN: 12 mg/dL (ref 6–20)
CALCIUM: 9.2 mg/dL (ref 8.9–10.3)
CO2: 29 mmol/L (ref 22–32)
Chloride: 101 mmol/L (ref 101–111)
Creatinine, Ser: 0.8 mg/dL (ref 0.44–1.00)
GFR calc Af Amer: >60 mL/min (ref >60)
GLUCOSE: 90 mg/dL (ref 65–99)
Potassium: 4.2 mmol/L (ref 3.5–5.1)
Sodium: 135 mmol/L (ref 135–145)

## 2017-03-14 NOTE — Progress Notes (Signed)
Pt here for pre op visit and EKG. Sinus bradycardia noted on EKG. Pt states she has been having a low heart rate recently.  Dr. Lissa Hoard made aware, old EKG and current EKG reviewed, discussed medical history and meds.  Okay to proceed with surgery. Pt states she will follow up with her cardiologist after surgery. Encouraged healthy diet and fluid intake prior to procedure.

## 2017-03-19 ENCOUNTER — Encounter (HOSPITAL_BASED_OUTPATIENT_CLINIC_OR_DEPARTMENT_OTHER)
Admission: RE | Disposition: A | Discharge status: Home / Self Care | Payer: Self-pay | Source: Ambulatory Visit | Attending: Orthopaedic Surgery

## 2017-03-19 ENCOUNTER — Ambulatory Visit (HOSPITAL_BASED_OUTPATIENT_CLINIC_OR_DEPARTMENT_OTHER): Payer: 59 | Admitting: Anesthesiology

## 2017-03-19 ENCOUNTER — Ambulatory Visit (HOSPITAL_BASED_OUTPATIENT_CLINIC_OR_DEPARTMENT_OTHER)
Admission: RE | Admit: 2017-03-19 | Discharge: 2017-03-19 | Disposition: A | Discharge status: Home / Self Care | Payer: 59 | Source: Ambulatory Visit | Attending: Orthopaedic Surgery | Admitting: Orthopaedic Surgery

## 2017-03-19 ENCOUNTER — Other Ambulatory Visit: Payer: Self-pay

## 2017-03-19 ENCOUNTER — Other Ambulatory Visit: Payer: 59

## 2017-03-19 ENCOUNTER — Encounter (HOSPITAL_BASED_OUTPATIENT_CLINIC_OR_DEPARTMENT_OTHER): Payer: Self-pay | Admitting: *Deleted

## 2017-03-19 DIAGNOSIS — Z886 Allergy status to analgesic agent status: Secondary | ICD-10-CM | POA: Insufficient documentation

## 2017-03-19 DIAGNOSIS — I10 Essential (primary) hypertension: Secondary | ICD-10-CM | POA: Diagnosis not present

## 2017-03-19 DIAGNOSIS — E039 Hypothyroidism, unspecified: Secondary | ICD-10-CM | POA: Insufficient documentation

## 2017-03-19 DIAGNOSIS — G4730 Sleep apnea, unspecified: Secondary | ICD-10-CM | POA: Diagnosis not present

## 2017-03-19 DIAGNOSIS — E785 Hyperlipidemia, unspecified: Secondary | ICD-10-CM | POA: Insufficient documentation

## 2017-03-19 DIAGNOSIS — K219 Gastro-esophageal reflux disease without esophagitis: Secondary | ICD-10-CM | POA: Insufficient documentation

## 2017-03-19 DIAGNOSIS — M1812 Unilateral primary osteoarthritis of first carpometacarpal joint, left hand: Secondary | ICD-10-CM | POA: Insufficient documentation

## 2017-03-19 DIAGNOSIS — Z888 Allergy status to other drugs, medicaments and biological substances status: Secondary | ICD-10-CM | POA: Insufficient documentation

## 2017-03-19 DIAGNOSIS — Z79899 Other long term (current) drug therapy: Secondary | ICD-10-CM | POA: Diagnosis not present

## 2017-03-19 HISTORY — PX: CARPOMETACARPEL SUSPENSION PLASTY: SHX5005

## 2017-03-19 SURGERY — CARPOMETACARPEL (CMC) SUSPENSION PLASTY
Anesthesia: General | Site: Thumb | Laterality: Left

## 2017-03-19 MED ORDER — CHLORHEXIDINE GLUCONATE 4 % EX LIQD: 60.0000 mL | Freq: Once | CUTANEOUS | Status: DC

## 2017-03-19 MED ORDER — CEFAZOLIN SODIUM-DEXTROSE 2-4 GM/100ML-% IV SOLN
INTRAVENOUS | Status: AC
  Filled 2017-03-19: qty 100

## 2017-03-19 MED ORDER — FENTANYL CITRATE (PF) 100 MCG/2ML IJ SOLN
INTRAMUSCULAR | Status: AC
  Filled 2017-03-19: qty 2

## 2017-03-19 MED ORDER — BUPIVACAINE HCL (PF) 0.25 % IJ SOLN
INTRAMUSCULAR | Status: AC
  Filled 2017-03-19: qty 30

## 2017-03-19 MED ORDER — METOCLOPRAMIDE HCL 5 MG/ML IJ SOLN: 10.0000 mg | Freq: Once | INTRAMUSCULAR | Status: DC | PRN

## 2017-03-19 MED ORDER — LACTATED RINGERS IV SOLN: INTRAVENOUS | Status: DC

## 2017-03-19 MED ORDER — ROPIVACAINE HCL 5 MG/ML IJ SOLN
INTRAMUSCULAR | Status: DC | PRN
  Administered 2017-03-19: 30 mL via PERINEURAL

## 2017-03-19 MED ORDER — MIDAZOLAM HCL 2 MG/2ML IJ SOLN
INTRAMUSCULAR | Status: AC
  Filled 2017-03-19: qty 2

## 2017-03-19 MED ORDER — DEXAMETHASONE SODIUM PHOSPHATE 10 MG/ML IJ SOLN
INTRAMUSCULAR | Status: DC | PRN
  Administered 2017-03-19: 10 mg via INTRAVENOUS

## 2017-03-19 MED ORDER — ONDANSETRON HCL 4 MG PO TABS: 4.0000 mg | ORAL_TABLET | Freq: Three times a day (TID) | ORAL | 0 refills | Status: DC | PRN

## 2017-03-19 MED ORDER — LACTATED RINGERS IV SOLN
INTRAVENOUS | Status: DC
  Administered 2017-03-19 (×2): via INTRAVENOUS

## 2017-03-19 MED ORDER — SCOPOLAMINE 1 MG/3DAYS TD PT72: 1.0000 | MEDICATED_PATCH | Freq: Once | TRANSDERMAL | Status: DC | PRN

## 2017-03-19 MED ORDER — EPHEDRINE 5 MG/ML INJ
INTRAVENOUS | Status: AC
  Filled 2017-03-19: qty 10

## 2017-03-19 MED ORDER — MIDAZOLAM HCL 2 MG/2ML IJ SOLN
1.0000 mg | INTRAMUSCULAR | Status: DC | PRN
  Administered 2017-03-19: 2 mg via INTRAVENOUS

## 2017-03-19 MED ORDER — SENNOSIDES-DOCUSATE SODIUM 8.6-50 MG PO TABS: 1.0000 | ORAL_TABLET | Freq: Every evening | ORAL | 1 refills | Status: DC | PRN

## 2017-03-19 MED ORDER — EPHEDRINE 5 MG/ML INJ
INTRAVENOUS | Status: AC
  Filled 2017-03-19: qty 20

## 2017-03-19 MED ORDER — DEXAMETHASONE SODIUM PHOSPHATE 10 MG/ML IJ SOLN
INTRAMUSCULAR | Status: AC
  Filled 2017-03-19: qty 2

## 2017-03-19 MED ORDER — LIDOCAINE HCL (CARDIAC) 20 MG/ML IV SOLN
INTRAVENOUS | Status: DC | PRN
  Administered 2017-03-19: 30 mg via INTRAVENOUS

## 2017-03-19 MED ORDER — FENTANYL CITRATE (PF) 100 MCG/2ML IJ SOLN: 25.0000 ug | INTRAMUSCULAR | Status: DC | PRN

## 2017-03-19 MED ORDER — PROPOFOL 500 MG/50ML IV EMUL
INTRAVENOUS | Status: AC
  Filled 2017-03-19: qty 100

## 2017-03-19 MED ORDER — OXYCODONE-ACETAMINOPHEN 5-325 MG PO TABS: 1.0000 | ORAL_TABLET | ORAL | 0 refills | Status: DC | PRN

## 2017-03-19 MED ORDER — LIDOCAINE 2% (20 MG/ML) 5 ML SYRINGE
INTRAMUSCULAR | Status: AC
  Filled 2017-03-19: qty 15

## 2017-03-19 MED ORDER — CEFAZOLIN SODIUM-DEXTROSE 2-4 GM/100ML-% IV SOLN
2.0000 g | INTRAVENOUS | Status: AC
  Administered 2017-03-19: 2 g via INTRAVENOUS

## 2017-03-19 MED ORDER — PROPOFOL 10 MG/ML IV BOLUS
INTRAVENOUS | Status: DC | PRN
  Administered 2017-03-19: 100 mg via INTRAVENOUS

## 2017-03-19 MED ORDER — ONDANSETRON HCL 4 MG/2ML IJ SOLN
INTRAMUSCULAR | Status: AC
  Filled 2017-03-19: qty 8

## 2017-03-19 MED ORDER — ONDANSETRON HCL 4 MG/2ML IJ SOLN
INTRAMUSCULAR | Status: DC | PRN
  Administered 2017-03-19: 4 mg via INTRAVENOUS

## 2017-03-19 MED ORDER — FENTANYL CITRATE (PF) 100 MCG/2ML IJ SOLN
50.0000 ug | INTRAMUSCULAR | Status: DC | PRN
  Administered 2017-03-19: 100 ug via INTRAVENOUS

## 2017-03-19 MED ORDER — PROMETHAZINE HCL 25 MG PO TABS: 25.0000 mg | ORAL_TABLET | Freq: Four times a day (QID) | ORAL | 1 refills | Status: DC | PRN

## 2017-03-19 MED ORDER — MEPERIDINE HCL 25 MG/ML IJ SOLN: 6.2500 mg | INTRAMUSCULAR | Status: DC | PRN

## 2017-03-19 MED FILL — LEVOTHYROXINE 50 MCG TABLET: 50 | 30 days supply | Qty: 30 | Fill #2

## 2017-03-19 MED FILL — OXYCOD/ACETAMINOPHEN 5-325M: 5-325 | 3 days supply | Qty: 30 | Fill #0

## 2017-03-19 MED FILL — ONDANSETRON HCL 4 MG TABLET: 4 | 7 days supply | Qty: 40 | Fill #0

## 2017-03-19 MED FILL — PROMETHAZINE 25 MG TABLET: 25 | 7 days supply | Qty: 30 | Fill #0

## 2017-03-19 MED FILL — ESCITALOPRAM 20 MG TABLET: 20 | 30 days supply | Qty: 30 | Fill #2

## 2017-03-19 SURGICAL SUPPLY — 80 items
BANDAGE ACE 3X5.8 VEL STRL LF (GAUZE/BANDAGES/DRESSINGS) ×2 IMPLANT
BLADE MINI RND TIP GREEN BEAV (BLADE) ×2 IMPLANT
BLADE SURG 15 STRL LF DISP TIS (BLADE) ×2 IMPLANT
BLADE SURG 15 STRL SS (BLADE) ×6
BNDG CMPR 9X4 STRL LF SNTH (GAUZE/BANDAGES/DRESSINGS) ×1
BNDG ESMARK 4X9 LF (GAUZE/BANDAGES/DRESSINGS) ×2 IMPLANT
BRUSH SCRUB EZ PLAIN DRY (MISCELLANEOUS) ×2 IMPLANT
CORD BIPOLAR FORCEPS 12FT (ELECTRODE) ×2 IMPLANT
COVER BACK TABLE 60X90IN (DRAPES) ×2 IMPLANT
COVER MAYO STAND STRL (DRAPES) ×2 IMPLANT
CUFF TOURNIQUET SINGLE 18IN (TOURNIQUET CUFF) ×1 IMPLANT
DECANTER SPIKE VIAL GLASS SM (MISCELLANEOUS) IMPLANT
DRAPE EXTREMITY T 121X128X90 (DRAPE) ×2 IMPLANT
DRAPE IMP U-DRAPE 54X76 (DRAPES) ×2 IMPLANT
DRAPE OEC MINIVIEW 54X84 (DRAPES) ×2 IMPLANT
DRAPE SURG 17X23 STRL (DRAPES) ×2 IMPLANT
GAUZE SPONGE 4X4 12PLY STRL (GAUZE/BANDAGES/DRESSINGS) ×2 IMPLANT
GAUZE SPONGE 4X4 16PLY XRAY LF (GAUZE/BANDAGES/DRESSINGS) IMPLANT
GAUZE XEROFORM 1X8 LF (GAUZE/BANDAGES/DRESSINGS) ×2 IMPLANT
GLOVE BIOGEL PI IND STRL 7.0 (GLOVE) IMPLANT
GLOVE BIOGEL PI IND STRL 8 (GLOVE) IMPLANT
GLOVE BIOGEL PI IND STRL 8.5 (GLOVE) IMPLANT
GLOVE BIOGEL PI INDICATOR 7.0 (GLOVE) ×1
GLOVE BIOGEL PI INDICATOR 8 (GLOVE) ×1
GLOVE BIOGEL PI INDICATOR 8.5 (GLOVE) ×1
GLOVE ECLIPSE 6.5 STRL STRAW (GLOVE) ×2 IMPLANT
GLOVE ECLIPSE 7.0 STRL STRAW (GLOVE) ×1 IMPLANT
GLOVE ECLIPSE 7.5 STRL STRAW (GLOVE) ×1 IMPLANT
GLOVE SKINSENSE NS SZ7.5 (GLOVE) ×1
GLOVE SKINSENSE STRL SZ7.5 (GLOVE) ×1 IMPLANT
GLOVE SURG SYN 7.5  E (GLOVE) ×2
GLOVE SURG SYN 7.5 E (GLOVE) ×2 IMPLANT
GLOVE SURG SYN 7.5 PF PI (GLOVE) ×2 IMPLANT
GOWN SRG XL LVL 4 BRTHBL STRL (GOWNS) ×1 IMPLANT
GOWN STRL NON-REIN XL LVL4 (GOWNS) ×2
GOWN STRL REIN XL XLG (GOWN DISPOSABLE) ×2 IMPLANT
GOWN STRL REUS W/ TWL LRG LVL3 (GOWN DISPOSABLE) ×1 IMPLANT
GOWN STRL REUS W/ TWL XL LVL3 (GOWN DISPOSABLE) IMPLANT
GOWN STRL REUS W/TWL LRG LVL3 (GOWN DISPOSABLE) ×2
GOWN STRL REUS W/TWL XL LVL3 (GOWN DISPOSABLE) ×2
KIT ASCP FXDISP 3X8XBTNDS (KITS) IMPLANT
KIT BIO-TENODESIS 3X8 DISP (KITS) ×2
NDL HYPO 25X1 1.5 SAFETY (NEEDLE) IMPLANT
NEEDLE HYPO 25X1 1.5 SAFETY (NEEDLE) IMPLANT
NS IRRIG 1000ML POUR BTL (IV SOLUTION) ×2 IMPLANT
PACK BASIN DAY SURGERY FS (CUSTOM PROCEDURE TRAY) ×2 IMPLANT
PAD CAST 3X4 CTTN HI CHSV (CAST SUPPLIES) ×1 IMPLANT
PADDING CAST ABS 3INX4YD NS (CAST SUPPLIES)
PADDING CAST ABS 4INX4YD NS (CAST SUPPLIES) ×1
PADDING CAST ABS COTTON 3X4 (CAST SUPPLIES) IMPLANT
PADDING CAST ABS COTTON 4X4 ST (CAST SUPPLIES) ×1 IMPLANT
PADDING CAST COTTON 3X4 STRL (CAST SUPPLIES) ×2
RUBBERBAND STERILE (MISCELLANEOUS) ×2 IMPLANT
SCREW BIOCOM TENODESIS 4X10M (Screw) ×1 IMPLANT
SLEEVE SCD COMPRESS KNEE MED (MISCELLANEOUS) ×2 IMPLANT
SPLINT FIBERGLASS 3X35 (CAST SUPPLIES) IMPLANT
STOCKINETTE 4X48 STRL (DRAPES) ×2 IMPLANT
SUCTION FRAZIER HANDLE 10FR (MISCELLANEOUS) ×1
SUCTION TUBE FRAZIER 10FR DISP (MISCELLANEOUS) IMPLANT
SUT 2 FIBERLOOP 20 STRT BLUE (SUTURE)
SUT ETHIBOND 0 MO6 C/R (SUTURE) IMPLANT
SUT ETHILON 3 0 PS 1 (SUTURE) IMPLANT
SUT FIBERWIRE #2 38 T-5 BLUE (SUTURE)
SUT FIBERWIRE 2-0 18 17.9 3/8 (SUTURE)
SUT MNCRL AB 4-0 PS2 18 (SUTURE) IMPLANT
SUT VIC AB 0 SH 27 (SUTURE) ×1 IMPLANT
SUT VIC AB 2-0 CT1 27 (SUTURE) ×2
SUT VIC AB 2-0 CT1 TAPERPNT 27 (SUTURE) IMPLANT
SUT VIC AB 2-0 SH 27 (SUTURE) ×2
SUT VIC AB 2-0 SH 27XBRD (SUTURE) ×1 IMPLANT
SUTURE 2 FIBERLOOP 20 STRT BLU (SUTURE) IMPLANT
SUTURE FIBERWR #2 38 T-5 BLUE (SUTURE) IMPLANT
SUTURE FIBERWR 2-0 18 17.9 3/8 (SUTURE) IMPLANT
SYR BULB 3OZ (MISCELLANEOUS) ×2 IMPLANT
SYR CONTROL 10ML LL (SYRINGE) IMPLANT
TOWEL OR 17X24 6PK STRL BLUE (TOWEL DISPOSABLE) ×4 IMPLANT
TOWEL OR NON WOVEN STRL DISP B (DISPOSABLE) ×2 IMPLANT
TRAY DSU PREP LF (CUSTOM PROCEDURE TRAY) ×2 IMPLANT
TUBE CONNECTING 20X1/4 (TUBING) ×1 IMPLANT
UNDERPAD 30X30 (UNDERPADS AND DIAPERS) ×2 IMPLANT

## 2017-03-19 NOTE — Progress Notes (Signed)
Assisted Dr. Carignan with left, ultrasound guided, supraclavicular block. Side rails up, monitors on throughout procedure. See vital signs in flow sheet. Tolerated Procedure well. 

## 2017-03-19 NOTE — Anesthesia Postprocedure Evaluation (Signed)
Anesthesia Post Note  Patient: Jenny Bradford  Procedure(s) Performed: Left Thumb Ligament Reconstruction and Tendon Interposition (Left Thumb)     Patient location during evaluation: PACU Anesthesia Type: General Level of consciousness: awake and alert Pain management: pain level controlled Vital Signs Assessment: post-procedure vital signs reviewed and stable Respiratory status: spontaneous breathing, nonlabored ventilation, respiratory function stable and patient connected to nasal cannula oxygen Cardiovascular status: blood pressure returned to baseline and stable Postop Assessment: no apparent nausea or vomiting Anesthetic complications: no    Last Vitals:  Vitals:   03/19/17 1115 03/19/17 1151  BP: (!) 145/73 (!) 144/76  Pulse: 72 70  Resp: 16 20  Temp:  36.6 C  SpO2: 95% 98%    Last Pain:  Vitals:   03/19/17 1151  TempSrc: Oral  PainSc:                  Montez Hageman

## 2017-03-19 NOTE — H&P (Signed)
PREOPERATIVE H&P  Chief Complaint: left thumb carpometacarpal osteoarthritis  HPI: Jenny Bradford is a 67 y.o. female who presents for surgical treatment of left thumb carpometacarpal osteoarthritis.  She denies any changes in medical history.  Past Medical History:  Diagnosis Date  . Arthritis    oa  . Complication of anesthesia    slow to awaken in past  . Family history of anesthesia complication    slow to awaken  . GERD (gastroesophageal reflux disease)   . H/O hiatal hernia   . Headache(784.0)    migraines  . Hyperlipidemia   . Hypertension 04/01/11   ECHO-EF>55% NUC STRESS TEST- 05/01/12  . Hypothyroidism   . Sleep apnea 05/01/12 & 05/29/12   SLEEP STUDY-[place] [facility] AND SLEEP, NO CPAP USED SINCE MAY 2015   Past Surgical History:  Procedure Laterality Date  . ANKLE SURGERY Right 1980  . CARPOMETACARPEL SUSPENSION PLASTY Right 12/14/2014   Procedure: RIGHT THUMB CARPOMETACARPAL ([place]) ARTHROPLASTY;  Surgeon: Leandrew Koyanagi, MD;  Location: [place];  Service: Orthopedics;  Laterality: Right;  . COLONOSCOPY WITH PROPOFOL N/A 12/07/2013   Procedure: COLONOSCOPY WITH PROPOFOL;  Surgeon: Garlan Fair, MD;  Location[facility];  Service: Endoscopy;  Laterality: N/A;  . CYSTECTOMY  1975  . FOOT SURGERY Right 9.23.14  . FOOT SURGERY Left 10.01.13  . [place]  2005  . [place]  . TONSILLECTOMY  1964   Social History   Socioeconomic History  . Marital status: Married    Spouse name: None  . Number of children: None  . Years of education: None  . Highest education level: None  Social Needs  . Financial resource strain: None  . Food insecurity - worry: None  . Food insecurity - inability: None  . Transportation needs - medical: None  . Transportation needs - non-medical: None  Occupational History  . None  Tobacco Use  . Smoking status: Never Smoker  . Smokeless tobacco: Never Used  Substance and Sexual Activity  .  Alcohol use: No    Alcohol/week: 0.0 oz  . Drug use: No  . Sexual activity: None  Other Topics Concern  . None  Social History Narrative  . None   Family History  Problem Relation Age of Onset  . Breast cancer Mother   . Ovarian cancer Mother   . Heart attack Father   . Heart disease Father   . Hypertension Brother   . Hyperlipidemia Maternal Grandmother   . Hypertension Maternal Grandmother    Allergies  Allergen Reactions  . Midol [Aspirin-Cinnamedrine-Caffeine] Other (See Comments)    DIZZINESS  . Talwin [Pentazocine] Other (See Comments)    EXTREME DROWSINESS  . Naproxen Rash   Prior to Admission medications   Medication Sig Start Date End Date Taking? Authorizing Provider  acetaminophen (TYLENOL) 500 MG tablet Take 500 mg by mouth 2 (two) times daily.   Yes [provider]  ALPRAZolam (XANAX) 0.25 MG tablet Take 0.25 mg by mouth at bedtime as needed for sleep.    Yes [provider]  butalbital-acetaminophen-caffeine (FIORICET, ESGIC) 50-325-40 MG per tablet Take 1 tablet by mouth 2 (two) times daily as needed for headache.   Yes [provider]  calcium carbonate (TUMS) 500 MG chewable tablet Chew 1 tablet by mouth daily as needed for indigestion.    Yes [provider]  cholecalciferol (VITAMIN D) 1000 units tablet Take 2,000 Units by mouth daily.   Yes [provider]  escitalopram (LEXAPRO) 20 MG tablet Take 20 mg by mouth every morning.    Yes [provider]  estradiol (ESTRACE) 0.1 MG/GM vaginal cream Place 2 g vaginally 2 (two) times a week. USE TWICE WEEKLY   Yes [provider]  hydrochlorothiazide (HYDRODIURIL) 25 MG tablet Take 25 mg by mouth every morning.    Yes [provider]  levothyroxine (SYNTHROID, LEVOTHROID) 50 MCG tablet Take 50 mcg by mouth daily before breakfast.   Yes [provider]  losartan (COZAAR) 50 MG tablet Take 75 mg by mouth every morning.    Yes [provider]  pravastatin (PRAVACHOL) 40 MG tablet Take 40 mg by mouth at bedtime.    Yes [provider]  traMADol (ULTRAM) 50 MG tablet Take 50 mg by mouth every 6 (six) hours as needed.   Yes [provider]  SUMAtriptan (IMITREX) 50 MG tablet Take 50 mg by mouth as needed for migraine.    [provider]     Positive ROS: All other systems have been reviewed and were otherwise negative with the exception of those mentioned in the HPI and as above.  Physical Exam: General: Alert, no acute distress Cardiovascular: No pedal edema Respiratory: No cyanosis, no use of accessory musculature GI: abdomen soft Skin: No lesions in the area of chief complaint Neurologic: Sensation intact distally Psychiatric: Patient is competent for consent with normal mood and affect Lymphatic: no lymphedema  MUSCULOSKELETAL: exam stable  Assessment: left thumb carpometacarpal osteoarthritis  Plan: Plan for Procedure(s): Left Thumb Ligament Reconstruction and Tendon Interposition  The risks benefits and alternatives were discussed with the patient including but not limited to the risks of nonoperative treatment, versus surgical intervention including infection, bleeding, nerve injury,  blood clots, cardiopulmonary complications, morbidity, mortality, among others, and they were willing to proceed.   Eduard Roux, MD   03/19/2017 8:30 AM

## 2017-03-19 NOTE — Anesthesia Procedure Notes (Addendum)
Anesthesia Regional Block: Supraclavicular block   Pre-Anesthetic Checklist: ,, timeout performed, Correct Patient, Correct Site, Correct Laterality, Correct Procedure, Correct Position, site marked, Risks and benefits discussed,  Surgical consent,  Pre-op evaluation,  At surgeon's request and post-op pain management  Laterality: Left and Upper  Prep: Maximum Sterile Barrier Precautions used, chloraprep       Needles:  Injection technique: Single-shot  Needle Type: Echogenic Stimulator Needle     Needle Length: 10cm      Additional Needles:   Procedures:,,,, ultrasound used (permanent image in chart),,,,  Narrative:  Start time: 03/19/2017 7:57 AM End time: 03/19/2017 8:02 AM Injection made incrementally with aspirations every 5 mL.  Performed by: Personally  Anesthesiologist: Montez Hageman, MD  Additional Notes: Risks, benefits and alternative to block explained extensively.  Patient tolerated procedure well, without complications.

## 2017-03-19 NOTE — Anesthesia Procedure Notes (Signed)
Procedure Name: LMA Insertion Date/Time: 03/19/2017 8:41 AM Performed by: Signe Colt, CRNA Pre-anesthesia Checklist: Patient identified, Emergency Drugs available, Suction available and Patient being monitored Patient Re-evaluated:Patient Re-evaluated prior to induction Oxygen Delivery Method: Circle system utilized Preoxygenation: Pre-oxygenation with 100% oxygen Induction Type: IV induction Ventilation: Mask ventilation without difficulty LMA: LMA inserted LMA Size: 4.0 Number of attempts: 1 Airway Equipment and Method: Bite block Placement Confirmation: positive ETCO2 Tube secured with: Tape Dental Injury: Teeth and Oropharynx as per pre-operative assessment

## 2017-03-19 NOTE — Anesthesia Preprocedure Evaluation (Addendum)
Anesthesia Evaluation  Patient identified by MRN, date of birth, ID band Patient awake    Reviewed: Allergy & Precautions, NPO status , Patient's Chart, lab work & pertinent test results  Airway Mallampati: I  TM Distance: >3 FB Neck ROM: Full    Dental  (+) Teeth Intact, Dental Advisory Given   Pulmonary sleep apnea and Continuous Positive Airway Pressure Ventilation ,    breath sounds clear to auscultation       Cardiovascular hypertension, Pt. on medications  Rhythm:Regular Rate:Normal     Neuro/Psych negative neurological ROS  negative psych ROS   GI/Hepatic Neg liver ROS, GERD  Medicated and Controlled,  Endo/Other  negative endocrine ROS  Renal/GU negative Renal ROS  negative genitourinary   Musculoskeletal negative musculoskeletal ROS (+)   Abdominal   Peds negative pediatric ROS (+)  Hematology negative hematology ROS (+)   Anesthesia Other Findings   Reproductive/Obstetrics negative OB ROS                            Anesthesia Physical  Anesthesia Plan  ASA: II  Anesthesia Plan: General   Post-op Pain Management:  Regional for Post-op pain   Induction: Intravenous  PONV Risk Score and Plan: 2 and Ondansetron, Dexamethasone and Treatment may vary due to age or medical condition  Airway Management Planned: LMA  Additional Equipment:   Intra-op Plan:   Post-operative Plan: Extubation in OR  Informed Consent: I have reviewed the patients History and Physical, chart, labs and discussed the procedure including the risks, benefits and alternatives for the proposed anesthesia with the patient or authorized representative who has indicated his/her understanding and acceptance.   Dental advisory given  Plan Discussed with: CRNA, Anesthesiologist and Surgeon  Anesthesia Plan Comments:         Anesthesia Quick Evaluation

## 2017-03-19 NOTE — Discharge Instructions (Signed)
Postoperative instructions:  Weightbearing instructions: non weight bearing  Dressing instructions: Keep your dressing and/or splint clean and dry at all times.  It will be removed at your first post-operative appointment.  Your stitches and/or staples will be removed at this visit.  Incision instructions:  Do not soak your incision for 3 weeks after surgery.  If the incision gets wet, pat dry and do not scrub the incision.  Pain control:  You have been given a prescription to be taken as directed for post-operative pain control.  In addition, elevate the operative extremity above the heart at all times to prevent swelling and throbbing pain.  Take over-the-counter Colace, 100mg  by mouth twice a day while taking narcotic pain medications to help prevent constipation.  Follow up appointments: 1) 10-14 days for suture removal and wound check. 2) Dr. Erlinda Hong as scheduled.   -------------------------------------------------------------------------------------------------------------  After Surgery Pain Control:  After your surgery, post-surgical discomfort or pain is likely. This discomfort can last several days to a few weeks. At certain times of the day your discomfort may be more intense.  Did you receive a nerve block?  A nerve block can provide pain relief for one hour to two days after your surgery. As long as the nerve block is working, you will experience little or no sensation in the area the surgeon operated on.  As the nerve block wears off, you will begin to experience pain or discomfort. It is very important that you begin taking your prescribed pain medication before the nerve block fully wears off. Treating your pain at the first sign of the block wearing off will ensure your pain is better controlled and more tolerable when full-sensation returns. Do not wait until the pain is intolerable, as the medicine will be less effective. It is better to treat pain in advance than to try and  catch up.  General Anesthesia:  If you did not receive a nerve block during your surgery, you will need to start taking your pain medication shortly after your surgery and should continue to do so as prescribed by your surgeon.  Pain Medication:  Most commonly we prescribe Vicodin and Percocet for post-operative pain. Both of these medications contain a combination of acetaminophen (Tylenol) and a narcotic to help control pain.   It takes between 30 and 45 minutes before pain medication starts to work. It is important to take your medication before your pain level gets too intense.   Nausea is a common side effect of many pain medications. You will want to eat something before taking your pain medicine to help prevent nausea.   If you are taking a prescription pain medication that contains acetaminophen, we recommend that you do not take additional over the counter acetaminophen (Tylenol).  Other pain relieving options:   Using a cold pack to ice the affected area a few times a day (15 to 20 minutes at a time) can help to relieve pain, reduce swelling and bruising.   Elevation of the affected area can also help to reduce pain and swelling.      Call your surgeon if you experience:   1.  Fever over 101.0. 2.  Inability to urinate. 3.  Nausea and/or vomiting. 4.  Extreme swelling or bruising at the surgical site. 5.  Continued bleeding from the incision. 6.  Increased pain, redness or drainage from the incision. 7.  Problems related to your pain medication. 8.  Any problems and/or concerns    [facility]  Care Instructions  Activity: Get plenty of rest for the remainder of the day. A responsible individual must stay with you for 24 hours following the procedure.  For the next 24 hours, DO NOT: -Drive a car -Paediatric nurse -Drink alcoholic beverages -Take any medication unless instructed by your physician -Make any legal decisions or sign important  papers.  Meals: Start with liquid foods such as gelatin or soup. Progress to regular foods as tolerated. Avoid greasy, spicy, heavy foods. If nausea and/or vomiting occur, drink only clear liquids until the nausea and/or vomiting subsides. Call your physician if vomiting continues.  Special Instructions/Symptoms: Your throat may feel dry or sore from the anesthesia or the breathing tube placed in your throat during surgery. If this causes discomfort, gargle with warm salt water. The discomfort should disappear within 24 hours.  If you had a scopolamine patch placed behind your ear for the management of post- operative nausea and/or vomiting:  1. The medication in the patch is effective for 72 hours, after which it should be removed.  Wrap patch in a tissue and discard in the trash. Wash hands thoroughly with soap and water. 2. You may remove the patch earlier than 72 hours if you experience unpleasant side effects which may include dry mouth, dizziness or visual disturbances. 3. Avoid touching the patch. Wash your hands with soap and water after contact with the patch.     Regional Anesthesia Blocks  1. Numbness or the inability to move the "blocked" extremity may last from 3-48 hours after placement. The length of time depends on the medication injected and your individual response to the medication. If the numbness is not going away after 48 hours, call your surgeon.  2. The extremity that is blocked will need to be protected until the numbness is gone and the  Strength has returned. Because you cannot feel it, you will need to take extra care to avoid injury. Because it may be weak, you may have difficulty moving it or using it. You may not know what position it is in without looking at it while the block is in effect.  3. For blocks in the legs and feet, returning to weight bearing and walking needs to be done carefully. You will need to wait until the numbness is entirely gone and the  strength has returned. You should be able to move your leg and foot normally before you try and bear weight or walk. You will need someone to be with you when you first try to ensure you do not fall and possibly risk injury.  4. Bruising and tenderness at the needle site are common side effects and will resolve in a few days.  5. Persistent numbness or new problems with movement should be communicated to the surgeon or the [facility] [phone] [place] [phone]).

## 2017-03-19 NOTE — Op Note (Signed)
   DATE OF SURGERY:03/19/2017  PREOPERATIVE DIAGNOSIS: Left thumb basal joint arthritis.  POSTOPERATIVE DIAGNOSIS: same.  PROCEDURE:  1. Left thumb carpometacarpal interposition arthroplasty ([id number]); thumb  2. left carpometacarpal reconstruction with tendon graft ([id number]).    IMPLANTS: Arthrex 4 x 10 biocomposite tenodesis screw  SURGEON: N. Eduard Roux, MD  ASSIST: Madalyn Rob, PA-C; necessary for the timely completion of procedure and due to complexity of procedure.  ANESTHESIA: regional and general  TOURNIQUET TIME: 38 mins.  BLOOD LOSS: Minimal.  COMPLICATIONS: None.  PATHOLOGY: None.  TIME OUT: A time out was performed before the procedure started.  INDICATIONS FOR PROCEDURE: The patient presents today for the above mentioned procedure after failing extensive conservative measures.  The risks, benefits, and alternatives to surgery were discussed and the patient elected to proceed with surgery.  DESCRIPTION OF PROCEDURE: A Wagner's incision was made. The superficial radial nerve was identified and protected. Radial artery was exposed and protected. We dissected sharply in between the extensor pollicis brevis and abductor pollicis longus tendon interval. Capsulotomy was performed. Capsular flaps were raised. Fluoroscopy was used to identify the borders of the trapezium.  Trapezium was exposed and removed in a piecemeal fashion. A 4 mm drill hole was made in the base of the first metacarpal in a volar ulnar direction.  I then made a second incision about 4 cm proximal to the wrist crease. Flexor carpi radialis tendon was exposed and transected. The FCR tendon then was delivered out through the Wagner's incision.  The FCR tendon was passed through the drill hole and came out radial dorsally over the first metacarpal. With the FCR tendon appropriately tensioned and confirmed with fluoroscopy, the tenodesis screw was placed.  The wound was irrigated. The capsular tissue  was repaired using 2-0 Vicryl sutures. Then tourniquet was deflated. Hemostasis achieved. Wounds were irrigated and closed using 4-0 nylon sutures. Sterile dressing applied, hand immobilized in a thumb spica splint. The patient then was transferred to the recovery room in stable condition after all counts were correct.  POSTOPERATIVE PLAN: Return in two weeks for suture removal and application of a thumb spica cast. Four weeks after surgery cast will be removed and switch to a thumb spica brace and start hand therapy.

## 2017-03-19 NOTE — Transfer of Care (Signed)
Immediate Anesthesia Transfer of Care Note  Patient: Jenny Bradford  Procedure(s) Performed: Left Thumb Ligament Reconstruction and Tendon Interposition (Left Thumb)  Patient Location: PACU  Anesthesia Type:GA combined with regional for post-op pain  Level of Consciousness: awake and patient cooperative  Airway & Oxygen Therapy: Patient Spontanous Breathing and Patient connected to face mask oxygen  Post-op Assessment: Report given to RN and Post -op Vital signs reviewed and stable  Post vital signs: Reviewed and stable  Last Vitals:  Vitals:   03/19/17 0805 03/19/17 1001  BP: (!) 169/84 123/67  Pulse: (!) 56 69  Resp: (!) 6 12  Temp:    SpO2: 100% 100%    Last Pain:  Vitals:   03/19/17 0650  TempSrc: Oral         Complications: No apparent anesthesia complications

## 2017-03-20 ENCOUNTER — Encounter (HOSPITAL_BASED_OUTPATIENT_CLINIC_OR_DEPARTMENT_OTHER): Payer: Self-pay | Admitting: Orthopaedic Surgery

## 2017-03-26 ENCOUNTER — Telehealth: Payer: Self-pay | Admitting: Cardiovascular Disease

## 2017-03-26 NOTE — Telephone Encounter (Signed)
New message      STAT if HR is under 50 or over 120 (normal HR is 60-100 beats per minute)  1) What is your heart rate? 58-63  2) Do you have a log of your heart rate readings (document readings)?  Has not been logging info  Do you have any other symptoms? Palpitations and fatigue

## 2017-03-26 NOTE — Telephone Encounter (Signed)
Spoke with patient and she has been having issues with low heart rate for a while and has been watching it. Would like to schedule appointment with Dr Claiborne Billings since she has not been seen in a several years. Scheduled patient appointment with Dr Claiborne Billings, aware of date, time, and location.

## 2017-04-01 ENCOUNTER — Encounter (INDEPENDENT_AMBULATORY_CARE_PROVIDER_SITE_OTHER): Payer: Self-pay | Admitting: Orthopaedic Surgery

## 2017-04-01 ENCOUNTER — Ambulatory Visit (INDEPENDENT_AMBULATORY_CARE_PROVIDER_SITE_OTHER): Payer: 59 | Admitting: Orthopaedic Surgery

## 2017-04-01 DIAGNOSIS — M1812 Unilateral primary osteoarthritis of first carpometacarpal joint, left hand: Secondary | ICD-10-CM

## 2017-04-01 NOTE — Progress Notes (Signed)
Patient is 2 weeks status post left LRTI.  Her pain is 5 out of 10 and well controlled with pain medicines.  Occasionally she has taken Percocet.  Her incisions are all healed.  She is neurovascular intact.  The sutures were removed today.  Thumb spica brace was given today which she should wear at all times.  She may get the incision wet at this point.  Follow-up in 2 weeks for recheck and initiation of hand therapy.

## 2017-04-03 ENCOUNTER — Ambulatory Visit
Admission: RE | Admit: 2017-04-03 | Discharge: 2017-04-03 | Disposition: A | Discharge status: Home / Self Care | Payer: 59 | Source: Ambulatory Visit | Attending: Gastroenterology | Admitting: Gastroenterology

## 2017-04-03 DIAGNOSIS — K219 Gastro-esophageal reflux disease without esophagitis: Secondary | ICD-10-CM

## 2017-04-03 DIAGNOSIS — R079 Chest pain, unspecified: Secondary | ICD-10-CM

## 2017-04-08 ENCOUNTER — Ambulatory Visit: Payer: 59 | Admitting: Adult Health

## 2017-04-08 ENCOUNTER — Telehealth (INDEPENDENT_AMBULATORY_CARE_PROVIDER_SITE_OTHER): Payer: Self-pay | Admitting: Orthopaedic Surgery

## 2017-04-08 NOTE — Telephone Encounter (Signed)
Both operative note and recent office note faxed to [phone]. Bettey Mare at [employer].

## 2017-04-08 NOTE — Telephone Encounter (Signed)
Christy from [employer] called requesting the most recent office notes and the operative notes for the patient.  CB#[phone] and fax#[phone].  Thank you.

## 2017-04-11 DIAGNOSIS — I5189 Other ill-defined heart diseases: Secondary | ICD-10-CM

## 2017-04-11 DIAGNOSIS — I351 Nonrheumatic aortic (valve) insufficiency: Secondary | ICD-10-CM

## 2017-04-11 DIAGNOSIS — I340 Nonrheumatic mitral (valve) insufficiency: Secondary | ICD-10-CM

## 2017-04-11 HISTORY — DX: Nonrheumatic aortic (valve) insufficiency: I35.1

## 2017-04-11 HISTORY — DX: Other ill-defined heart diseases: I51.89

## 2017-04-11 HISTORY — DX: Nonrheumatic mitral (valve) insufficiency: I34.0

## 2017-04-14 ENCOUNTER — Other Ambulatory Visit: Payer: Self-pay | Admitting: Internal Medicine

## 2017-04-14 DIAGNOSIS — R911 Solitary pulmonary nodule: Secondary | ICD-10-CM

## 2017-04-14 DIAGNOSIS — R918 Other nonspecific abnormal finding of lung field: Secondary | ICD-10-CM

## 2017-04-14 MED FILL — LOSARTAN POTASSIUM 50 MG TA: 50 | 30 days supply | Qty: 45 | Fill #2

## 2017-04-14 MED FILL — PANTOPRAZOLE SOD DR 40 MG T: 40 | 30 days supply | Qty: 30 | Fill #5

## 2017-04-14 MED FILL — LEVOTHYROXINE 50 MCG TABLET: 50 | 30 days supply | Qty: 30 | Fill #3

## 2017-04-14 MED FILL — ALPRAZolam 0.25 MG TABS: 0.25 | 30 days supply | Qty: 30 | Fill #0

## 2017-04-14 MED FILL — HYDROCHLOROTHIAZIDE 25 MG T: 25 | 30 days supply | Qty: 30 | Fill #2

## 2017-04-14 MED FILL — ESCITALOPRAM 20 MG TABLET: 20 | 30 days supply | Qty: 30 | Fill #0

## 2017-04-14 MED FILL — PRAVASTATIN NA 40 MG TAB: 40 | 30 days supply | Qty: 30 | Fill #2

## 2017-04-15 ENCOUNTER — Ambulatory Visit (INDEPENDENT_AMBULATORY_CARE_PROVIDER_SITE_OTHER): Payer: 59 | Admitting: Orthopaedic Surgery

## 2017-04-15 ENCOUNTER — Encounter (INDEPENDENT_AMBULATORY_CARE_PROVIDER_SITE_OTHER): Payer: Self-pay | Admitting: Orthopaedic Surgery

## 2017-04-15 DIAGNOSIS — M1812 Unilateral primary osteoarthritis of first carpometacarpal joint, left hand: Secondary | ICD-10-CM

## 2017-04-15 NOTE — Progress Notes (Signed)
Post-Op Visit Note   Patient: Jenny Bradford           Date of Birth: [birth year]           MRN: [id number] Visit Date: 04/15/2017 PCP: Thressa Sheller, MD   Assessment & Plan:  Chief Complaint:  Chief Complaint  Patient presents with  . Right Shoulder - Pain  . Follow-up    left thumb ligament reconstructionb on 03/19/17   Visit Diagnoses:  1. Arthritis of carpometacarpal (CMC) joint of left thumb     Plan: Jenny Bradford comes in for follow-up.  27 days status post left thumb ligament reconstruction and tendon interposition date of surgery 03/19/2017.  She has been in a thumb spica splint.  She still admits to moderate pain.  She is having tingling burning and numbness as well.  She has been taking Tylenol for the pain.  Examination of her left thumb reveals a well-healed surgical incision without evidence of infection.  She does have moderate swelling.  At this point we will have Jenny Bradford go ahead and start hand therapy.  She can DC her wrist splint.  She will follow-up with [place] in 4 weeks time.    Follow-Up Instructions: Return in about 4 weeks (around 05/13/2017).   Orders:  No orders of the defined types were placed in this encounter.  No orders of the defined types were placed in this encounter.   Imaging: No results found.  PMFS History: Patient Active Problem List   Diagnosis Date Noted  . Neck pain 01/03/2017  . Large hiatal hernia 12/28/2016  . Multiple pulmonary nodules determined by computed tomography of lung 12/27/2016  . Arthritis of carpometacarpal [facility]) joint of left thumb 05/28/2016  . OSA on CPAP 10/05/2012  . HTN (hypertension) 10/05/2012  . Hyperlipidemia 10/05/2012  . Migraine headache 10/05/2012   Past Medical History:  Diagnosis Date  . Arthritis    oa  . Complication of anesthesia    slow to awaken in past  . Family history of anesthesia complication    slow to awaken  . GERD (gastroesophageal reflux disease)   . H/O hiatal hernia   .  Headache(784.0)    migraines  . Hyperlipidemia   . Hypertension 04/01/11   ECHO-EF>55% NUC STRESS TEST- 05/01/12  . Hypothyroidism   . Sleep apnea 05/01/12 & 05/29/12   SLEEP STUDY-[facility], NO CPAP USED SINCE MAY 2015    Family History  Problem Relation Age of Onset  . Breast cancer Mother   . Ovarian cancer Mother   . Heart attack Father   . Heart disease Father   . Hypertension Brother   . Hyperlipidemia Maternal Grandmother   . Hypertension Maternal Grandmother     Past Surgical History:  Procedure Laterality Date  . ANKLE SURGERY Right 1980  . CARPOMETACARPEL SUSPENSION PLASTY Right 12/14/2014   Procedure: RIGHT THUMB CARPOMETACARPAL ([place]) ARTHROPLASTY;  Surgeon: Leandrew Koyanagi, MD;  Location: [facility];  Service: Orthopedics;  Laterality: Right;  . CARPOMETACARPEL SUSPENSION PLASTY Left 03/19/2017   Procedure: Left Thumb Ligament Reconstruction and Tendon Interposition;  Surgeon: Leandrew Koyanagi, MD;  Location: [facility];  Service: Orthopedics;  Laterality: Left;  . COLONOSCOPY WITH PROPOFOL N/A 12/07/2013   Procedure: COLONOSCOPY WITH PROPOFOL;  Surgeon: Garlan Fair, MD;  Location: [facility];  Service: Endoscopy;  Laterality: N/A;  . CYSTECTOMY  1975  . FOOT SURGERY Right 9.23.14  . FOOT SURGERY Left 10.01.13  . HEMORRHOID  SURGERY  2005  . [place]  . TONSILLECTOMY  1964   Social History   Occupational History  . Not on file  Tobacco Use  . Smoking status: Never Smoker  . Smokeless tobacco: Never Used  Substance and Sexual Activity  . Alcohol use: No    Alcohol/week: 0.0 oz  . Drug use: No  . Sexual activity: Not on file

## 2017-04-16 ENCOUNTER — Telehealth (INDEPENDENT_AMBULATORY_CARE_PROVIDER_SITE_OTHER): Payer: Self-pay | Admitting: *Deleted

## 2017-04-16 NOTE — Telephone Encounter (Signed)
Received fax from PT and [address] advising pt has appt scheduled on 04/16/17 at 11am, pt is aware of appt

## 2017-04-21 ENCOUNTER — Ambulatory Visit: Payer: 59 | Admitting: Cardiovascular Disease

## 2017-04-21 ENCOUNTER — Encounter: Payer: Self-pay | Admitting: Cardiovascular Disease

## 2017-04-21 VITALS — BP 132/84 | HR 66 | Ht 63.0 in | Wt 167.4 lb

## 2017-04-21 DIAGNOSIS — I10 Essential (primary) hypertension: Secondary | ICD-10-CM

## 2017-04-21 DIAGNOSIS — K449 Diaphragmatic hernia without obstruction or gangrene: Secondary | ICD-10-CM | POA: Diagnosis not present

## 2017-04-21 DIAGNOSIS — Z8249 Family history of ischemic heart disease and other diseases of the circulatory system: Secondary | ICD-10-CM

## 2017-04-21 DIAGNOSIS — G4733 Obstructive sleep apnea (adult) (pediatric): Secondary | ICD-10-CM | POA: Diagnosis not present

## 2017-04-21 DIAGNOSIS — E782 Mixed hyperlipidemia: Secondary | ICD-10-CM

## 2017-04-21 DIAGNOSIS — E039 Hypothyroidism, unspecified: Secondary | ICD-10-CM | POA: Diagnosis not present

## 2017-04-21 MED ORDER — LOSARTAN POTASSIUM 100 MG PO TABS: 100.0000 mg | ORAL_TABLET | Freq: Every morning | ORAL | 3 refills | Status: DC

## 2017-04-21 NOTE — Patient Instructions (Signed)
Medication Instructions:  INCREASE Losartan to 100 mg daily  Testing/Procedures: Your physician has requested that you have an echocardiogram. Echocardiography is a painless test that uses sound waves to create images of your heart. It provides your doctor with information about the size and shape of your heart and how well your heart's chambers and valves are working. This procedure takes approximately one hour. There are no restrictions for this procedure.  This will be done at our [facility] location:  [place] wants you to follow-up in: 6 months with Dr. Claiborne Billings. You will receive a reminder letter in the mail two months in advance. If you don't receive a letter, please call our office to schedule the follow-up appointment.   Any Other Special Instructions Will Be Listed Below (If Applicable).     If you need a refill on your cardiac medications before your next appointment, please call your pharmacy.

## 2017-04-21 NOTE — Progress Notes (Signed)
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  I had seen her in July 2014 in a sleep clinic following initiation of CPAP therapy for obstructive sleep apnea.  I had cared for her father Jenny Bradford for many years who ultimately died on Feb 07, 2014 at [facility].  She presents to the office today to establish cardiology care and evaluation of recent elevated hypertension.  Jenny Bradford has a history of hypertension for many years.  Most recently, she has been on losartan 75 mg daily in addition to HCTZ 25 mg.  She also has a history of hypothyroidism and has been on levothyroxine at 50 g.  There is a history of hyperlipidemia and she has been on pravastatin 40 mg.  Her father had a history of significant abdominal aortic aneurysm.  She underwent abdominal aortic aneurysm screening and there was no significant aortic dilatation and instructed with a maximum aortic dimension at 2.3 cm.  She has a history of obstructive sleep apnea which was diagnosed in 2014 with an AHI of 14 per hour and RDI of 16.5 per hour.  She had significant oxygen desaturation to a nadir of 81% with rems sleep and had loud snoring.  Apparently should use CPAP for a short while but ultimately became intolerant was ultimately transitioned to an oral appliance for which she is followed by [name].   Recently, she is begun to notice that despite medication for her blood pressure, her blood pressure has been becoming more elevated.  She walks a proximally 4 days per week for 30 minutes at a time.  She denies any exertionally precipitation of chest pain.  She is unaware of any significant valvular abnormality, although her mother has aortic stenosis.  She also has a very large hiatal hernia and has seen  Dr. [name] and was told that in the future she may require surgery.  She denies any chest pain, PND, orthopnea.  She is unaware of any recent palpitations.  She presents for cardiology evaluation.  Past Medical History:  Diagnosis Date  . Arthritis    oa  . Complication of anesthesia    slow to awaken in past  . Family history of anesthesia complication    slow to awaken  . GERD (gastroesophageal reflux disease)   . H/O hiatal hernia   . Headache(784.0)    migraines  . Hyperlipidemia   . Hypertension 04/01/11   ECHO-EF>55% NUC STRESS TEST- 05/01/12  . Hypothyroidism   . Sleep apnea 05/01/12 & 05/29/12   SLEEP STUDY-[facility], NO CPAP USED SINCE MAY 2015    Past Surgical History:  Procedure Laterality Date  . ANKLE SURGERY Right 1980  . CARPOMETACARPEL SUSPENSION PLASTY Right 12/14/2014   Procedure: RIGHT THUMB CARPOMETACARPAL ([place]) ARTHROPLASTY;  Surgeon: Leandrew Koyanagi, MD;  Location: [facility];  Service: Orthopedics;  Laterality: Right;  . CARPOMETACARPEL SUSPENSION PLASTY Left 03/19/2017   Procedure: Left Thumb Ligament Reconstruction and Tendon Interposition;  Surgeon: Leandrew Koyanagi, MD;  Location: [facility];  Service: Orthopedics;  Laterality: Left;  . COLONOSCOPY WITH PROPOFOL N/A 12/07/2013   Procedure: COLONOSCOPY WITH PROPOFOL;  Surgeon: Garlan Fair, MD;  Location: [facility];  Service: Endoscopy;  Laterality: N/A;  . CYSTECTOMY  1975  . FOOT SURGERY Right 9.23.14  . FOOT SURGERY Left 10.01.13  . [place]  2005  . [place]  . TONSILLECTOMY  1964    Current Medications: Outpatient Medications Prior to Visit  Medication Sig Dispense Refill  . acetaminophen (TYLENOL) 500 MG tablet Take 500 mg by mouth 2 (two) times daily.    Marland Kitchen ALPRAZolam (XANAX) 0.25 MG tablet Take 0.25 mg by mouth at bedtime as needed for sleep.     . butalbital-acetaminophen-caffeine (FIORICET, ESGIC) 50-325-40 MG per tablet Take 1  tablet by mouth 2 (two) times daily as needed for headache.    . calcium carbonate (TUMS) 500 MG chewable tablet Chew 1 tablet by mouth daily as needed for indigestion.     . cholecalciferol (VITAMIN D) 1000 units tablet Take 2,000 Units by mouth daily.    Marland Kitchen docusate sodium (COLACE) 100 MG capsule Take 100 mg by mouth as directed.    . escitalopram (LEXAPRO) 20 MG tablet Take 20 mg by mouth every morning.     Marland Kitchen estradiol (ESTRACE) 0.1 MG/GM vaginal cream Place 2 g vaginally 2 (two) times a week. USE TWICE WEEKLY    . hydrochlorothiazide (HYDRODIURIL) 25 MG tablet Take 25 mg by mouth every morning.     Marland Kitchen levothyroxine (SYNTHROID, LEVOTHROID) 50 MCG tablet Take 50 mcg by mouth daily before breakfast.    . ondansetron (ZOFRAN) 4 MG tablet Take 1-2 tablets (4-8 mg total) by mouth every 8 (eight) hours as needed for nausea or vomiting. 40 tablet 0  . pantoprazole (PROTONIX) 40 MG tablet Take 40 mg by mouth daily.    . pravastatin (PRAVACHOL) 40 MG tablet Take 40 mg by mouth at bedtime.     . SUMAtriptan (IMITREX) 50 MG tablet Take 50 mg by mouth as needed for migraine.    . traMADol (ULTRAM) 50 MG tablet Take 50 mg by mouth every 6 (six) hours as needed.    Marland Kitchen losartan (COZAAR) 50 MG tablet Take 75 mg by mouth every morning.     Marland Kitchen oxyCODONE-acetaminophen (PERCOCET) 5-325 MG tablet Take 1-2 tablets by mouth every 4 (four) hours as needed for severe pain. 30 tablet 0  . promethazine (PHENERGAN) 25 MG tablet Take 1 tablet (25 mg total) by mouth every 6 (six) hours as needed for nausea. 30 tablet 1  . senna-docusate (SENOKOT S) 8.6-50 MG tablet Take 1 tablet by mouth at bedtime as needed. 30 tablet 1   No facility-administered medications prior to visit.      Allergies:   Midol [aspirin-cinnamedrine-caffeine]; Talwin [pentazocine]; and Naproxen   Social History   Socioeconomic History  . Marital status: Married    Spouse name: None  . Number of children: None  . Years of education: None  .  Highest education level: None  Social Needs  . Financial resource strain: None  . Food insecurity - worry: None  . Food insecurity - inability: None  . Transportation needs - medical: None  . Transportation needs - non-medical: None  Occupational History  . None  Tobacco Use  . Smoking status: Never Smoker  . Smokeless tobacco: Never Used  Substance and Sexual Activity  . Alcohol use: No    Alcohol/week: 0.0 oz  . Drug use: No  . Sexual activity: None  Other Topics Concern  . None  Social History Narrative  . 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  No tobacco history.  Family History:  The patient's family history includes Breast cancer in her mother; Heart attack in her father; Heart disease in her father; Hyperlipidemia in her maternal grandmother; Hypertension in her brother and maternal grandmother; Ovarian cancer in her mother.   ROS General: Negative; No fevers, chills, or night sweats;  HEENT: Negative; No changes in vision or hearing, sinus congestion, difficulty swallowing Pulmonary: Negative; No cough, wheezing, shortness of breath, hemoptysis Cardiovascular: See history of present illness, no chest pain, PND, orthopnea. GI: Positive for large hiatal hernia GU: Negative; No dysuria, hematuria, or difficulty voiding Musculoskeletal: Negative; no myalgias, joint pain, or weakness Hematologic/Oncology: Negative; no easy bruising, bleeding Endocrine: Negative; no heat/cold intolerance; no diabetes Neuro: Negative; no changes in balance, headaches Skin: Negative; No rashes or skin lesions Psychiatric: Negative; No behavioral problems, depression Sleep: Positive for OSA, now with a customized oral appliance with mandibular advancement; No snoring, daytime sleepiness, hypersomnolence, bruxism, restless legs, hypnogognic hallucinations, no cataplexy Other comprehensive 14 point system review is negative.   PHYSICAL EXAM:   VS:   BP 132/84   Pulse 66   Ht 5\' 3"  (1.6 m)   Wt 167 lb 6.4 oz (75.9 kg)   BMI 29.65 kg/m     Repeat blood pressure by me was 152/80  Wt Readings from Last 3 Encounters:  04/21/17 167 lb 6.4 oz (75.9 kg)  03/19/17 164 lb (74.4 kg)  12/27/16 163 lb 6.4 oz (74.1 kg)    General: Alert, oriented, no distress.  Skin: normal turgor, no rashes, warm and dry HEENT: Normocephalic, atraumatic. Pupils equal round and reactive to light; sclera anicteric; extraocular muscles intact; Fundi mild arterial narrowing, no hemorrhages or exudates.  Disc flat Nose without nasal septal hypertrophy Mouth/Parynx benign; Mallinpatti scale 3 Neck: No JVD, no carotid bruits; normal carotid upstroke Lungs: clear to ausculatation and percussion; no wheezing or rales Chest wall: without tenderness to palpitation Heart: PMI not displaced, RRR, s1 s2 normal, 1/6 systolic murmur, no diastolic murmur, no rubs, gallops, thrills, or heaves Abdomen: soft, nontender; no hepatosplenomehaly, BS+; abdominal aorta nontender and not dilated by palpation. Back: no CVA tenderness Pulses 2+ Musculoskeletal: full range of motion, normal strength, no joint deformities Extremities: no clubbing cyanosis or edema, Homan's sign negative  Neurologic: grossly nonfocal; Cranial nerves grossly wnl Psychologic: Normal mood and affect   Studies/Labs Reviewed:   EKG:  EKG is ordered today.  ECG (independently read by me): Normal sinus rhythm at 66 bpm.  No ST segment changes.  Normal intervals.  No ectopy.  Very mild RV conduction delay.  Recent Labs: BMP Latest Ref Rng & Units 03/14/2017  Glucose 65 - 99 mg/dL 90  BUN 6 - 20 mg/dL 12  Creatinine 0.44 - 1.00 mg/dL 0.80  Sodium 135 - 145 mmol/L 135  Potassium 3.5 - 5.1 mmol/L 4.2  Chloride 101 - 111 mmol/L 101  CO2 22 - 32 mmol/L 29  Calcium 8.9 - 10.3 mg/dL 9.2     No flowsheet data found.  No flowsheet data found. No results found for: MCV No results found for: TSH No results  found for: HGBA1C   BNP No results found for: BNP  ProBNP No results found for: PROBNP   Lipid Panel  No results found for: CHOL, TRIG, HDL, CHOLHDL, VLDL, LDLCALC, LDLDIRECT   RADIOLOGY: Dg Ugi W/high Density W/kub  Result Date: 04/03/2017 CLINICAL DATA:  Symptoms of gastroesophageal reflux, chest pain EXAM: UPPER GI  SERIES WITH KUB TECHNIQUE: After obtaining a scout radiograph a routine upper GI series was performed using thin and high density barium. FLUOROSCOPY TIME:  Fluoroscopy Time:  1 minute 36 seconds Radiation Exposure Index (if provided by the fluoroscopic device): 60 mGy Number of Acquired Spot Images: 0 COMPARISON:  Abdomen films of 11/30/2014 and CT chest of 12/05/2016 FINDINGS: The bowel gas pattern is nonspecific. No calculi are noted. The liver tip does extend to the right iliac crest which may indicate mild hepatomegaly or possibly Riedel's lobe. A double-contrast study was performed. The mucosa of the esophagus is unremarkable. There are mild tertiary contractions in the mid and distal esophagus. There is a large hiatal hernia present with the entire stomach within the hernia sac. The stomach is in an organoaxial position. No obstruction to the passage of barium distally is seen. The antrum of the stomach appears to pass through the esophageal hiatus with the duodenal bulb below the hemidiaphragm. The duodenal bulb fills with no abnormality noted. No gastroesophageal reflux could be demonstrated. The barium pill passed into the stomach without delay. IMPRESSION: 1. The entire stomach is within the hiatal hernia sac in an organoaxial position. No obstruction to the passage of barium distally. 2. No gastroesophageal reflux could be demonstrated. Barium pill passed into the stomach without delay. 3. Mild tertiary contractions in the mid and distal esophagus. Electronically Signed   By: Ivar Drape M.D.   On: 04/03/2017 08:41     Additional studies/ records that were reviewed  today include:  I reviewed the patient's prior evaluation at the [facility] in [facility].  following a sleep study.  I reviewed her imaging studies, and prior evaluation by Dr. Melvyn Novas.   ASSESSMENT:    1. Essential hypertension   2. Mixed hyperlipidemia   3. Hypothyroidism, unspecified type   4. Hiatal hernia   5. Family history of abdominal aortic aneurysm   6. 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  Her blood pressure today is elevated.  I reviewed with her the most recent update of the hypertensive guidelines.  I m recommending titration of losartan to 100 mg daily.  She will monitor her blood pressure with optimal blood pressure less than 130/80.  I'm scheduling her for 2-D echo Doppler study to further evaluate both systolic and diastolic function.  In addition to valvular architecture.  Her mother has a history of aortic valve stenosis.  Her father had extensive peripheral vascular history with aortic aneurysm.  She continues to be on pravastatin for hyperlipidemia.  She was recently found to have a very large hiatal hernia and was told that she may ultimately require surgery.  She denies any anginal type symptoms. If extensive surgery is necessary, a preoperative ischemic assessment may be indicated with her family history.  She continues on levothyroxine for hypothyroidism.  She currently is on pantoprazole for GERD.  Her most recent lipid studies from August 2018 showed a total cluster 176, HDL 59, LDL 78, triglycerides 196.  This should be repeated and adjustments to medication may be indicated.  She will monitor her blood pressure.  As long as she is stable, I will see her in 6 months for reevaluation unless her blood pressure does not improve with the increased  regimen or if GI surgery is planned and I will see her sooner.   Medication Adjustments/Labs and Tests Ordered: Current medicines are reviewed at length with the patient today.  Concerns regarding medicines are outlined above.  Medication changes, Labs and Tests ordered today are listed in the Patient Instructions below. Patient Instructions  Medication Instructions:  INCREASE Losartan to 100 mg daily  Testing/Procedures: Your physician has requested that you have an echocardiogram. Echocardiography is a painless test that uses sound waves to create images of your heart. It provides your doctor with information about the size and shape of your heart and how well your heart's chambers and valves are working. This procedure takes approximately one hour. There are no restrictions for this procedure.  This will be done at our [facility] location:  [place] wants you to follow-up in: 6 months with Dr. Claiborne Billings. You will receive a reminder letter in the mail two months in advance. If you don't receive a letter, please call our office to schedule the follow-up appointment.   Any Other Special Instructions Will Be Listed Below (If Applicable).     If you need a refill on your cardiac medications before your next appointment, please call your pharmacy.      Signed, [name], MD  04/28/2017 2:16 PM    [facility] [address], [place], [place], [place]  [address] Phone: [phone]

## 2017-04-28 ENCOUNTER — Encounter: Payer: Self-pay | Admitting: Cardiovascular Disease

## 2017-04-28 ENCOUNTER — Ambulatory Visit (HOSPITAL_COMMUNITY): Payer: 59 | Attending: Cardiovascular Disease

## 2017-04-28 ENCOUNTER — Other Ambulatory Visit: Payer: Self-pay

## 2017-04-28 DIAGNOSIS — I080 Rheumatic disorders of both mitral and aortic valves: Secondary | ICD-10-CM | POA: Diagnosis not present

## 2017-04-28 DIAGNOSIS — E785 Hyperlipidemia, unspecified: Secondary | ICD-10-CM | POA: Insufficient documentation

## 2017-04-28 DIAGNOSIS — I10 Essential (primary) hypertension: Secondary | ICD-10-CM | POA: Diagnosis present

## 2017-05-01 ENCOUNTER — Ambulatory Visit: Payer: Self-pay | Admitting: Surgery

## 2017-05-01 NOTE — H&P (View-Only) (Signed)
Jenny Bradford Documented: 05/01/2017 9:45 AM Location: [facility] Patient #: [id number] DOB: 1951/02/16 Married / Language: English / Race: White Female  History of Present Illness (Jenny Manalang A. 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 This was discovered on a CT of the chest with contrast which was done in September 2018 which was done to evaluate a pretracheal mass seen on x-ray (xr done for shoulder injury), this was not found on the CT however did demonstrate a large hiatal hernia with most of the stomach above the diaphragm. She also had a couple of small nodules for which six-month follow-up CT was recommended. Aortic atherosclerosis was also noted. Subsequent upper GI in January of this year showed that the entire stomach is within the hiatal hernia sac in an organoaxial position. No obstruction of the time of that study. No reflux was present, there were mild tertiary contractions in the mid and distal esophagus. She has occasional GERD, no dysphagia, but does report intermittent, approximately monthly spells of chest pain for the past 20 years with a negative cardiac workup on multiple occasions. She was told that her symptoms were related to esophageal spasm by her primary care doctor, but no workup was ever done to demonstrate this. Her symptoms are usually relieved with Tums and she takes Protonix religiously. For the last 6 weeks or so she's been having issues with nausea, it is initially relieved by food but, exacerbated about 2 hours after eating. She is also been dealing with constipation for the last 3 or 4 weeks. Denies emesis, hematochezia or melena or weight loss. She had an echocardiogram earlier this week which showed normal ejection fraction, no regional wall motion abnormalities, grade 2 diastolic dysfunction, mild aortic and mitral regurgitation. Last colonoscopy in  September 2015, diverticulosis otherwise normal repeat recommended in 10 years. Pulmonologist: Dr. Wyline Copas Cardiologist: Dr. [name] Gastroenterologist: Dr. [name]  Medical history includes sleep apnea, hypothyroid, hypertension, hyperlipidemia, migraines, hiatal hernia and GERD, osteoarthritis, and history of being slowed from anesthesia  Surgical history includes several orthopedic procedures on the foot/ankle/wrist, tonsillectomy, D&C and hemorrhoid surgery.  Family history positive for breast and ovarian cancer in her mother, heart disease and heart attack in her father  Does not use tobacco products, alcohol or drugs.  The patient is a 67 year old female.   Past Surgical History Levonne Spiller, CMA; 05/01/2017 9:49 AM) Breast Biopsy Left. Foot Surgery Bilateral. Hemorrhoidectomy Oral Surgery Tonsillectomy  Diagnostic Studies History Levonne Spiller, CMA; 05/01/2017 9:49 AM) Colonoscopy 1-5 years ago Mammogram within last year Pap Smear 1-5 years ago  Allergies Andee Poles Education officer, museum, CMA; 05/01/2017 9:50 AM) Talwin *ANALGESICS - OPIOID* Naproxen Sodium ER *ANALGESICS - ANTI-INFLAMMATORY* Midol *ANALGESICS - ANTI-INFLAMMATORY* Allergies Reconciled  Medication History (Danielle Gerrigner, CMA; 05/01/2017 9:54 AM) Pantoprazole Sodium (40MG  Tablet DR, Oral) Active. Losartan Potassium (50MG  Tablet, Oral) Active. Ondansetron HCl (4MG  Tablet, Oral) Active. Levothyroxine Sodium (50MCG Tablet, Oral) Active. TraMADol HCl (50MG  Tablet, Oral) Active. Estrace (0.1MG /GM Cream, Vaginal) Active. Escitalopram Oxalate (20MG  Tablet, Oral) Active. HydroCHLOROthiazide (25MG  Tablet, Oral) Active. ALPRAZolam (0.25MG  Tablet, Oral) Active. Oxycodone-Acetaminophen (5-325MG  Tablet, Oral) Active. Pravastatin Sodium (40MG  Tablet, Oral) Active. SUMAtriptan Succinate (100MG  Tablet, Oral) Active. Colace (100MG  Capsule, Oral) Active. Vitamin D  (Cholecalciferol) (1000UNIT Capsule, Oral) Active. Butalbital-Acetaminophen (50-325MG  Tablet, Oral) Active. Medications Reconciled  Social History Andee Poles Education officer, museum, CMA; 05/01/2017 9:49 AM) Alcohol use Occasional alcohol use. No caffeine use No drug use Tobacco use Never smoker.  Family History Levonne Spiller, CMA; 05/01/2017 9:49 AM) Anesthetic complications Mother. Arthritis Mother. Bleeding disorder Mother. Breast Cancer Mother. Cerebrovascular Accident Father. Heart Disease Brother, Father, Mother. Heart disease in female family member before age 71 Heart disease in female family member before age 57 Hypertension Brother, Father, Mother, Son. Kidney Disease Brother. Migraine Headache Daughter. Ovarian Cancer Mother. Thyroid problems Mother.  Pregnancy / Birth History Levonne Spiller, CMA; 05/01/2017 9:49 AM) Age at menarche 81 years. Age of menopause 39-50 Contraceptive History Oral contraceptives. Gravida 3 Length (months) of breastfeeding 7-12 Maternal age 7-30 Para 2  Other Problems Levonne Spiller, CMA; 05/01/2017 9:49 AM) Arthritis Chest pain Gastroesophageal Reflux Disease General anesthesia - complications Hemorrhoids High blood pressure Hypercholesterolemia Migraine Headache Sleep Apnea Thyroid Disease     Review of Systems Andee Poles Gerrigner CMA; 05/01/2017 9:50 AM) General Present- Fatigue. Not Present- Appetite Loss, Chills, Fever, Night Sweats, Weight Gain and Weight Loss. Skin Not Present- Change in Wart/Mole, Dryness, Hives, Jaundice, New Lesions, Non-Healing Wounds, Rash and Ulcer. HEENT Present- Wears glasses/contact lenses. Not Present- Earache, Hearing Loss, Hoarseness, Nose Bleed, Oral Ulcers, Ringing in the Ears, Seasonal Allergies, Sinus Pain, Sore Throat, Visual Disturbances and Yellow Eyes. Respiratory Present- Snoring. Not Present- Bloody sputum, Chronic Cough, Difficulty Breathing and  Wheezing. Breast Not Present- Breast Mass, Breast Pain, Nipple Discharge and Skin Changes. Cardiovascular Present- Chest Pain, Palpitations and Swelling of Extremities. Not Present- Difficulty Breathing Lying Down, Leg Cramps, Rapid Heart Rate and Shortness of Breath. Gastrointestinal Present- Bloating, Change in Bowel Habits, Constipation, Hemorrhoids, Indigestion and Nausea. Not Present- Abdominal Pain, Bloody Stool, Chronic diarrhea, Difficulty Swallowing, Excessive gas, Gets full quickly at meals, Rectal Pain and Vomiting. Female Genitourinary Not Present- Frequency, Nocturia, Painful Urination, Pelvic Pain and Urgency. Musculoskeletal Present- Joint Pain. Not Present- Back Pain, Joint Stiffness, Muscle Pain, Muscle Weakness and Swelling of Extremities. Neurological Not Present- Decreased Memory, Fainting, Headaches, Numbness, Seizures, Tingling, Tremor, Trouble walking and Weakness. Psychiatric Not Present- Anxiety, Bipolar, Change in Sleep Pattern, Depression, Fearful and Frequent crying. Endocrine Not Present- Cold Intolerance, Excessive Hunger, Hair Changes, Heat Intolerance, Hot flashes and New Diabetes. Hematology Not Present- Blood Thinners, Easy Bruising, Excessive bleeding, Gland problems, HIV and Persistent Infections.  Vitals Andee Poles Gerrigner CMA; 05/01/2017 9:54 AM) 05/01/2017 9:54 AM Weight: 166.13 lb Height: 63in Body Surface Area: 1.79 m Body Mass Index: 29.43 kg/m  Temp.: 96.38F(Oral)  Pulse: 76 (Regular)  BP: 118/78 (Sitting, Left Arm, Standard)      Physical Exam (Bellarae Lizer A. Kae Heller MD; 05/01/2017 10:15 AM)  General Note: alert and well appearing  Integumentary Note: warm and dry  Head and Neck Note: no mass or thyromegaly  Eye Note: No scleral icterus. Extra ocular motions intact.  ENMT Note: Moist mucous membranes, dentition intact  Chest and Lung Exam Note: Unlabored respirations, clear bilaterally  Cardiovascular Note: Regular  rate and rhythm, no pedal edema  Abdomen Note: Soft, nontender nondistended. No mass or organomegaly.  Neurologic Note: Grossly intact, normal gait  Neuropsychiatric Note: Normal mood and affect. Appropriate insight.  Musculoskeletal Note: Strength symmetrical throughout, no deformity. Left wrist in splint    Assessment & Plan (Murriel Holwerda A. Amaro Mangold MD; 05/01/2017 10:17 AM)  PARAESOPHAGEAL HIATAL HERNIA (K44.9) Story: Large hiatal hernia with essentially an entire stomach intrathoracic. I suspect that a lot of her symptoms for the past couple decades have been secondary to this. We discussed the anatomy and physiology of this problem and discussed the surgical approach to repair in detail. We discussed the risks of infection, bleeding,  pain, scarring, injury to intra-abdominal or intrathoracic structures, we discussed long-term risks of dysphagia, gas bloat symptoms, nausea, slipped Nissen or recurrence of hiatal hernia. We discussed the option of no surgery which carries the risk of gastric volvulus and ongoing symptoms of obstruction and pain. She is eager to pursue surgery. She came prepared today with a list of insightful questions which were answered to her satisfaction. We will get her scheduled in the next few weeks.

## 2017-05-01 NOTE — H&P (Signed)
Dana Allan Documented: 05/01/2017 9:45 AM Location: [facility] Patient #: [id number] DOB: 21-May-1950 Married / Language: English / Race: White Female  History of Present Illness (Nunzio Banet A. 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 This was discovered on a CT of the chest with contrast which was done in September 2018 which was done to evaluate a pretracheal mass seen on x-ray (xr done for shoulder injury), this was not found on the CT however did demonstrate a large hiatal hernia with most of the stomach above the diaphragm. She also had a couple of small nodules for which six-month follow-up CT was recommended. Aortic atherosclerosis was also noted. Subsequent upper GI in January of this year showed that the entire stomach is within the hiatal hernia sac in an organoaxial position. No obstruction of the time of that study. No reflux was present, there were mild tertiary contractions in the mid and distal esophagus. She has occasional GERD, no dysphagia, but does report intermittent, approximately monthly spells of chest pain for the past 20 years with a negative cardiac workup on multiple occasions. She was told that her symptoms were related to esophageal spasm by her primary care doctor, but no workup was ever done to demonstrate this. Her symptoms are usually relieved with Tums and she takes Protonix religiously. For the last 6 weeks or so she's been having issues with nausea, it is initially relieved by food but, exacerbated about 2 hours after eating. She is also been dealing with constipation for the last 3 or 4 weeks. Denies emesis, hematochezia or melena or weight loss. She had an echocardiogram earlier this week which showed normal ejection fraction, no regional wall motion abnormalities, grade 2 diastolic dysfunction, mild aortic and mitral regurgitation. Last colonoscopy in  September 2015, diverticulosis otherwise normal repeat recommended in 10 years. Pulmonologist: Dr. Wyline Copas Cardiologist: Dr. [name] Gastroenterologist: Dr. [name]  Medical history includes sleep apnea, hypothyroid, hypertension, hyperlipidemia, migraines, hiatal hernia and GERD, osteoarthritis, and history of being slowed from anesthesia  Surgical history includes several orthopedic procedures on the foot/ankle/wrist, tonsillectomy, D&C and hemorrhoid surgery.  Family history positive for breast and ovarian cancer in her mother, heart disease and heart attack in her father  Does not use tobacco products, alcohol or drugs.  The patient is a 67 year old female.   Past Surgical History Levonne Spiller, CMA; 05/01/2017 9:49 AM) Breast Biopsy Left. Foot Surgery Bilateral. Hemorrhoidectomy Oral Surgery Tonsillectomy  Diagnostic Studies History Levonne Spiller, CMA; 05/01/2017 9:49 AM) Colonoscopy 1-5 years ago Mammogram within last year Pap Smear 1-5 years ago  Allergies Andee Poles [employer] officer[employer], CMA; 05/01/2017 9:50 AM) Talwin *ANALGESICS - OPIOID* Naproxen Sodium ER *ANALGESICS - ANTI-INFLAMMATORY* Midol *ANALGESICS - ANTI-INFLAMMATORY* Allergies Reconciled  Medication History (Danielle Gerrigner, CMA; 05/01/2017 9:54 AM) Pantoprazole Sodium (40MG  Tablet DR, Oral) Active. Losartan Potassium (50MG  Tablet, Oral) Active. Ondansetron HCl (4MG  Tablet, Oral) Active. Levothyroxine Sodium (50MCG Tablet, Oral) Active. TraMADol HCl (50MG  Tablet, Oral) Active. Estrace (0.1MG /GM Cream, Vaginal) Active. Escitalopram Oxalate (20MG  Tablet, Oral) Active. HydroCHLOROthiazide (25MG  Tablet, Oral) Active. ALPRAZolam (0.25MG  Tablet, Oral) Active. Oxycodone-Acetaminophen (5-325MG  Tablet, Oral) Active. Pravastatin Sodium (40MG  Tablet, Oral) Active. SUMAtriptan Succinate (100MG  Tablet, Oral) Active. Colace (100MG  Capsule, Oral) Active. Vitamin D  (Cholecalciferol) (1000UNIT Capsule, Oral) Active. Butalbital-Acetaminophen (50-325MG  Tablet, Oral) Active. Medications Reconciled  Social History Andee Poles Education officer, museum, CMA; 05/01/2017 9:49 AM) Alcohol use Occasional alcohol use. No caffeine use No drug use Tobacco use Never smoker.  Family History Levonne Spiller, CMA; 05/01/2017 9:49 AM) Anesthetic complications Mother. Arthritis Mother. Bleeding disorder Mother. Breast Cancer Mother. Cerebrovascular Accident Father. Heart Disease Brother, Father, Mother. Heart disease in female family member before age 52 Heart disease in female family member before age 7 Hypertension Brother, Father, Mother, Son. Kidney Disease Brother. Migraine Headache Daughter. Ovarian Cancer Mother. Thyroid problems Mother.  Pregnancy / Birth History Levonne Spiller, CMA; 05/01/2017 9:49 AM) Age at menarche 74 years. Age of menopause 90-50 Contraceptive History Oral contraceptives. Gravida 3 Length (months) of breastfeeding 7-12 Maternal age 55-30 Para 2  Other Problems Levonne Spiller, CMA; 05/01/2017 9:49 AM) Arthritis Chest pain Gastroesophageal Reflux Disease General anesthesia - complications Hemorrhoids High blood pressure Hypercholesterolemia Migraine Headache Sleep Apnea Thyroid Disease     Review of Systems Andee Poles Gerrigner CMA; 05/01/2017 9:50 AM) General Present- Fatigue. Not Present- Appetite Loss, Chills, Fever, Night Sweats, Weight Gain and Weight Loss. Skin Not Present- Change in Wart/Mole, Dryness, Hives, Jaundice, New Lesions, Non-Healing Wounds, Rash and Ulcer. HEENT Present- Wears glasses/contact lenses. Not Present- Earache, Hearing Loss, Hoarseness, Nose Bleed, Oral Ulcers, Ringing in the Ears, Seasonal Allergies, Sinus Pain, Sore Throat, Visual Disturbances and Yellow Eyes. Respiratory Present- Snoring. Not Present- Bloody sputum, Chronic Cough, Difficulty Breathing and  Wheezing. Breast Not Present- Breast Mass, Breast Pain, Nipple Discharge and Skin Changes. Cardiovascular Present- Chest Pain, Palpitations and Swelling of Extremities. Not Present- Difficulty Breathing Lying Down, Leg Cramps, Rapid Heart Rate and Shortness of Breath. Gastrointestinal Present- Bloating, Change in Bowel Habits, Constipation, Hemorrhoids, Indigestion and Nausea. Not Present- Abdominal Pain, Bloody Stool, Chronic diarrhea, Difficulty Swallowing, Excessive gas, Gets full quickly at meals, Rectal Pain and Vomiting. Female Genitourinary Not Present- Frequency, Nocturia, Painful Urination, Pelvic Pain and Urgency. Musculoskeletal Present- Joint Pain. Not Present- Back Pain, Joint Stiffness, Muscle Pain, Muscle Weakness and Swelling of Extremities. Neurological Not Present- Decreased Memory, Fainting, Headaches, Numbness, Seizures, Tingling, Tremor, Trouble walking and Weakness. Psychiatric Not Present- Anxiety, Bipolar, Change in Sleep Pattern, Depression, Fearful and Frequent crying. Endocrine Not Present- Cold Intolerance, Excessive Hunger, Hair Changes, Heat Intolerance, Hot flashes and New Diabetes. Hematology Not Present- Blood Thinners, Easy Bruising, Excessive bleeding, Gland problems, HIV and Persistent Infections.  Vitals Andee Poles Gerrigner CMA; 05/01/2017 9:54 AM) 05/01/2017 9:54 AM Weight: 166.13 lb Height: 63in Body Surface Area: 1.79 m Body Mass Index: 29.43 kg/m  Temp.: 96.70F(Oral)  Pulse: 76 (Regular)  BP: 118/78 (Sitting, Left Arm, Standard)      Physical Exam (Jara Feider A. Kae Heller MD; 05/01/2017 10:15 AM)  General Note: alert and well appearing  Integumentary Note: warm and dry  Head and Neck Note: no mass or thyromegaly  Eye Note: No scleral icterus. Extra ocular motions intact.  ENMT Note: Moist mucous membranes, dentition intact  Chest and Lung Exam Note: Unlabored respirations, clear bilaterally  Cardiovascular Note: Regular  rate and rhythm, no pedal edema  Abdomen Note: Soft, nontender nondistended. No mass or organomegaly.  Neurologic Note: Grossly intact, normal gait  Neuropsychiatric Note: Normal mood and affect. Appropriate insight.  Musculoskeletal Note: Strength symmetrical throughout, no deformity. Left wrist in splint    Assessment & Plan (Tomasa Dobransky A. Zosia Lucchese MD; 05/01/2017 10:17 AM)  PARAESOPHAGEAL HIATAL HERNIA (K44.9) Story: Large hiatal hernia with essentially an entire stomach intrathoracic. I suspect that a lot of her symptoms for the past couple decades have been secondary to this. We discussed the anatomy and physiology of this problem and discussed the surgical approach to repair in detail. We discussed the risks of infection, bleeding,  pain, scarring, injury to intra-abdominal or intrathoracic structures, we discussed long-term risks of dysphagia, gas bloat symptoms, nausea, slipped Nissen or recurrence of hiatal hernia. We discussed the option of no surgery which carries the risk of gastric volvulus and ongoing symptoms of obstruction and pain. She is eager to pursue surgery. She came prepared today with a list of insightful questions which were answered to her satisfaction. We will get her scheduled in the next few weeks.

## 2017-05-02 ENCOUNTER — Encounter: Payer: Self-pay | Admitting: Cardiovascular Disease

## 2017-05-06 ENCOUNTER — Encounter (HOSPITAL_COMMUNITY): Payer: Self-pay

## 2017-05-06 NOTE — Pre-Procedure Instructions (Signed)
The following are in epic: Last office visit note from Dr. Claiborne Billings, EKG 04/21/2017 ECHO 04/28/2017

## 2017-05-06 NOTE — Patient Instructions (Addendum)
Your procedure is scheduled on: Tuesday, May 13, 2017   Surgery Time:  12Noon-3:00PM   Report to [facility] Main  Entrance    Report to admitting at 10 AM   Call this number if you have problems the morning of surgery [phone]   Do not eat food or drink liquids :After Midnight.   Do NOT smoke after Midnight   Take these medicines the morning of surgery with A SIP OF WATER: Escitalopram, Pantoprazole                               You may not have any metal on your body including hair pins, jewelry, and body piercings             Do not wear make-up, lotions, powders, perfumes/cologne, or deodorant             Do not wear nail polish.  Do not shave  48 hours prior to surgery.              Do not bring valuables to the hospital. [place].   Contacts, dentures or bridgework may not be worn into surgery.   Leave suitcase in the car. After surgery it may be brought to your room.   Special Instructions: Bring a copy of your healthcare power of attorney and living will documents         the day of surgery if you haven't scanned them in before.              Please read over the following fact sheets you were given:  [facility] - Preparing for Surgery Before surgery, you can play an important role.  Because skin is not sterile, your skin needs to be as free of germs as possible.  You can reduce the number of germs on your skin by washing with CHG (chlorahexidine gluconate) soap before surgery.  CHG is an antiseptic cleaner which kills germs and bonds with the skin to continue killing germs even after washing. Please DO NOT use if you have an allergy to CHG or antibacterial soaps.  If your skin becomes reddened/irritated stop using the CHG and inform your nurse when you arrive at Short Stay. Do not shave (including legs and underarms) for at least 48 hours prior to the first CHG shower.  You may shave your  face/neck.  Please follow these instructions carefully:  1.  Shower with CHG Soap the night before surgery and the  morning of surgery.  2.  If you choose to wash your hair, wash your hair first as usual with your normal  shampoo.  3.  After you shampoo, rinse your hair and body thoroughly to remove the shampoo.                             4.  Use CHG as you would any other liquid soap.  You can apply chg directly to the skin and wash.  Gently with a scrungie or clean washcloth.  5.  Apply the CHG Soap to your body ONLY FROM THE NECK DOWN.   Do   not use on face/ open  Wound or open sores. Avoid contact with eyes, ears mouth and   genitals (private parts).                       Wash face,  Genitals (private parts) with your normal soap.             6.  Wash thoroughly, paying special attention to the area where your    surgery  will be performed.  7.  Thoroughly rinse your body with warm water from the neck down.  8.  DO NOT shower/wash with your normal soap after using and rinsing off the CHG Soap.                9.  Pat yourself dry with a clean towel.            10.  Wear clean pajamas.            11.  Place clean sheets on your bed the night of your first shower and do not  sleep with pets. Day of Surgery : Do not apply any lotions/deodorants the morning of surgery.  Please wear clean clothes to the hospital/surgery center.  FAILURE TO FOLLOW THESE INSTRUCTIONS MAY RESULT IN THE CANCELLATION OF YOUR SURGERY  PATIENT SIGNATURE_________________________________  NURSE SIGNATURE__________________________________  ________________________________________________________________________

## 2017-05-07 ENCOUNTER — Encounter (HOSPITAL_COMMUNITY): Payer: Self-pay

## 2017-05-07 ENCOUNTER — Encounter (HOSPITAL_COMMUNITY)
Admission: RE | Admit: 2017-05-07 | Discharge: 2017-05-07 | Disposition: A | Discharge status: Home / Self Care | Payer: 59 | Source: Ambulatory Visit | Attending: Surgery | Admitting: Surgery

## 2017-05-07 ENCOUNTER — Other Ambulatory Visit: Payer: Self-pay

## 2017-05-07 DIAGNOSIS — Z01812 Encounter for preprocedural laboratory examination: Secondary | ICD-10-CM | POA: Diagnosis not present

## 2017-05-07 DIAGNOSIS — K449 Diaphragmatic hernia without obstruction or gangrene: Secondary | ICD-10-CM | POA: Insufficient documentation

## 2017-05-07 HISTORY — DX: Heart disease, unspecified: I51.9

## 2017-05-07 HISTORY — DX: Other nonspecific abnormal finding of lung field: R91.8

## 2017-05-07 HISTORY — DX: Nonrheumatic mitral (valve) insufficiency: I34.0

## 2017-05-07 HISTORY — DX: Unspecified hemorrhoids: K64.9

## 2017-05-07 HISTORY — DX: Abdominal aortic aneurysm, without rupture: I71.4

## 2017-05-07 HISTORY — DX: Nonrheumatic aortic (valve) insufficiency: I35.1

## 2017-05-07 LAB — BASIC METABOLIC PANEL
Anion gap: 9 (ref 5–15)
BUN: 16 mg/dL (ref 6–20)
CALCIUM: 9.2 mg/dL (ref 8.9–10.3)
CO2: 24 mmol/L (ref 22–32)
CREATININE: 0.82 mg/dL (ref 0.44–1.00)
Chloride: 101 mmol/L (ref 101–111)
Glucose, Bld: 95 mg/dL (ref 65–99)
Potassium: 3.9 mmol/L (ref 3.5–5.1)
SODIUM: 134 mmol/L — AB (ref 135–145)

## 2017-05-07 LAB — CBC WITH DIFFERENTIAL/PLATELET
BASOS ABS: 0 10*3/uL (ref 0.0–0.1)
BASOS PCT: 0 %
Eosinophils Absolute: 0.1 10*3/uL (ref 0.0–0.7)
Eosinophils Relative: 2 %
HCT: 40.6 % (ref 36.0–46.0)
HEMOGLOBIN: 13.6 g/dL (ref 12.0–15.0)
LYMPHS PCT: 10 %
Lymphs Abs: 0.8 10*3/uL (ref 0.7–4.0)
MCH: 32.5 pg (ref 26.0–34.0)
MCHC: 33.5 g/dL (ref 30.0–36.0)
MCV: 96.9 fL (ref 78.0–100.0)
Monocytes Absolute: 0.4 10*3/uL (ref 0.1–1.0)
Monocytes Relative: 5 %
NEUTROS PCT: 83 %
Neutro Abs: 6 10*3/uL (ref 1.7–7.7)
Platelets: 292 10*3/uL (ref 150–400)
RBC: 4.19 MIL/uL (ref 3.87–5.11)
RDW: 13.1 % (ref 11.5–15.5)
WBC: 7.3 10*3/uL (ref 4.0–10.5)

## 2017-05-07 NOTE — Pre-Procedure Instructions (Addendum)
Dr. Jillyn Hidden reviewed ECHO results and Abdominal aorta duplex report.  No new orders received at this time. Both reports placed in patient's hard chart.

## 2017-05-07 NOTE — Pre-Procedure Instructions (Signed)
BMP results 05/07/2017 faxed to Dr. Kae Heller via epic.

## 2017-05-12 MED FILL — ALPRAZolam 0.25 MG TABS: 0.25 | 30 days supply | Qty: 30 | Fill #0

## 2017-05-12 MED FILL — LEVOTHYROXINE 50 MCG TABLET: 50 | 30 days supply | Qty: 30 | Fill #4

## 2017-05-12 MED FILL — HYDROCHLOROTHIAZIDE 25 MG T: 25 | 30 days supply | Qty: 30 | Fill #0

## 2017-05-12 MED FILL — PRAVASTATIN NA 40 MG TAB: 40 | 30 days supply | Qty: 30 | Fill #0

## 2017-05-12 MED FILL — ESCITALOPRAM 20 MG TABLET: 20 | 30 days supply | Qty: 30 | Fill #1

## 2017-05-12 MED FILL — ESTRACE 0.01% CREAM: 0.1 | 30 days supply | Qty: 43 | Fill #3

## 2017-05-12 MED FILL — PANTOPRAZOLE SOD DR 40 MG T: 40 | 30 days supply | Qty: 30 | Fill #0

## 2017-05-13 ENCOUNTER — Inpatient Hospital Stay (HOSPITAL_COMMUNITY)
Admission: RE | Admit: 2017-05-13 | Discharge: 2017-05-15 | DRG: 328 | Disposition: A | Discharge status: Home / Self Care | Payer: 59 | Source: Ambulatory Visit | Attending: Surgery | Admitting: Surgery

## 2017-05-13 ENCOUNTER — Inpatient Hospital Stay (HOSPITAL_COMMUNITY): Payer: 59 | Admitting: Anesthesiology

## 2017-05-13 ENCOUNTER — Encounter (HOSPITAL_COMMUNITY)
Admission: RE | Disposition: A | Discharge status: Home / Self Care | Payer: Self-pay | Source: Ambulatory Visit | Attending: Surgery

## 2017-05-13 ENCOUNTER — Encounter (HOSPITAL_COMMUNITY): Payer: Self-pay

## 2017-05-13 ENCOUNTER — Ambulatory Visit (INDEPENDENT_AMBULATORY_CARE_PROVIDER_SITE_OTHER): Payer: 59 | Admitting: Orthopaedic Surgery

## 2017-05-13 ENCOUNTER — Other Ambulatory Visit: Payer: Self-pay

## 2017-05-13 DIAGNOSIS — K449 Diaphragmatic hernia without obstruction or gangrene: Secondary | ICD-10-CM | POA: Diagnosis present

## 2017-05-13 DIAGNOSIS — Z79890 Hormone replacement therapy (postmenopausal): Secondary | ICD-10-CM | POA: Diagnosis not present

## 2017-05-13 DIAGNOSIS — K219 Gastro-esophageal reflux disease without esophagitis: Secondary | ICD-10-CM | POA: Diagnosis present

## 2017-05-13 DIAGNOSIS — I10 Essential (primary) hypertension: Secondary | ICD-10-CM | POA: Diagnosis present

## 2017-05-13 DIAGNOSIS — E039 Hypothyroidism, unspecified: Secondary | ICD-10-CM | POA: Diagnosis present

## 2017-05-13 DIAGNOSIS — E785 Hyperlipidemia, unspecified: Secondary | ICD-10-CM | POA: Diagnosis present

## 2017-05-13 DIAGNOSIS — E7800 Pure hypercholesterolemia, unspecified: Secondary | ICD-10-CM | POA: Diagnosis present

## 2017-05-13 DIAGNOSIS — Z9889 Other specified postprocedural states: Secondary | ICD-10-CM

## 2017-05-13 DIAGNOSIS — G4730 Sleep apnea, unspecified: Secondary | ICD-10-CM | POA: Diagnosis present

## 2017-05-13 DIAGNOSIS — Z8719 Personal history of other diseases of the digestive system: Secondary | ICD-10-CM

## 2017-05-13 SURGERY — REPAIR, HERNIA, PARAESOPHAGEAL, LAPAROSCOPIC
Anesthesia: General | Site: Abdomen

## 2017-05-13 MED ORDER — SODIUM CHLORIDE 0.9 % IJ SOLN
INTRAMUSCULAR | Status: DC | PRN
  Administered 2017-05-13: 10 mL

## 2017-05-13 MED ORDER — SODIUM CHLORIDE 0.9 % IJ SOLN
INTRAMUSCULAR | Status: AC
  Filled 2017-05-13: qty 10

## 2017-05-13 MED ORDER — CHLORHEXIDINE GLUCONATE 4 % EX LIQD: 60.0000 mL | Freq: Once | CUTANEOUS | Status: DC

## 2017-05-13 MED ORDER — HYDROMORPHONE HCL 1 MG/ML IJ SOLN
0.5000 mg | INTRAMUSCULAR | Status: DC | PRN
  Administered 2017-05-13: 0.5 mg via INTRAVENOUS

## 2017-05-13 MED ORDER — DEXAMETHASONE SODIUM PHOSPHATE 10 MG/ML IJ SOLN
INTRAMUSCULAR | Status: DC | PRN
  Administered 2017-05-13: 10 mg via INTRAVENOUS

## 2017-05-13 MED ORDER — LIDOCAINE 2% (20 MG/ML) 5 ML SYRINGE
INTRAMUSCULAR | Status: DC | PRN
  Administered 2017-05-13: 60 mg via INTRAVENOUS
  Administered 2017-05-13: 40 mg via INTRAVENOUS

## 2017-05-13 MED ORDER — ESCITALOPRAM OXALATE 10 MG PO TABS
10.0000 mg | ORAL_TABLET | Freq: Every morning | ORAL | Status: DC
  Administered 2017-05-14: 10 mg via ORAL
  Filled 2017-05-13: qty 1

## 2017-05-13 MED ORDER — HYDROMORPHONE HCL 1 MG/ML IJ SOLN
INTRAMUSCULAR | Status: AC
  Administered 2017-05-13: 0.5 mg via INTRAVENOUS
  Filled 2017-05-13: qty 1

## 2017-05-13 MED ORDER — METOPROLOL TARTRATE 5 MG/5ML IV SOLN: 5.0000 mg | Freq: Four times a day (QID) | INTRAVENOUS | Status: DC | PRN

## 2017-05-13 MED ORDER — OXYCODONE HCL 5 MG PO TABS
5.0000 mg | ORAL_TABLET | ORAL | Status: DC | PRN
  Administered 2017-05-13: 5 mg via ORAL
  Filled 2017-05-13: qty 1

## 2017-05-13 MED ORDER — POLYETHYLENE GLYCOL 3350 17 G PO PACK: 17.0000 g | PACK | Freq: Every day | ORAL | Status: DC | PRN

## 2017-05-13 MED ORDER — SUCCINYLCHOLINE CHLORIDE 200 MG/10ML IV SOSY
PREFILLED_SYRINGE | INTRAVENOUS | Status: AC
  Filled 2017-05-13: qty 10

## 2017-05-13 MED ORDER — TRAMADOL HCL 50 MG PO TABS: 50.0000 mg | ORAL_TABLET | Freq: Four times a day (QID) | ORAL | Status: DC | PRN

## 2017-05-13 MED ORDER — GABAPENTIN 300 MG PO CAPS: 300.0000 mg | ORAL_CAPSULE | Freq: Two times a day (BID) | ORAL | Status: DC

## 2017-05-13 MED ORDER — LEVOTHYROXINE SODIUM 50 MCG PO TABS
50.0000 ug | ORAL_TABLET | Freq: Every day | ORAL | Status: DC
  Administered 2017-05-14: 50 ug via ORAL
  Filled 2017-05-13 (×2): qty 1

## 2017-05-13 MED ORDER — ACETAMINOPHEN 500 MG PO TABS
1000.0000 mg | ORAL_TABLET | ORAL | Status: AC
  Administered 2017-05-13: 1000 mg via ORAL
  Filled 2017-05-13: qty 2

## 2017-05-13 MED ORDER — MIDAZOLAM HCL 5 MG/5ML IJ SOLN
INTRAMUSCULAR | Status: DC | PRN
  Administered 2017-05-13 (×2): 1 mg via INTRAVENOUS

## 2017-05-13 MED ORDER — ALPRAZOLAM 0.25 MG PO TABS: 0.2500 mg | ORAL_TABLET | Freq: Every evening | ORAL | Status: DC | PRN

## 2017-05-13 MED ORDER — 0.9 % SODIUM CHLORIDE (POUR BTL) OPTIME
TOPICAL | Status: DC | PRN
  Administered 2017-05-13: 1000 mL

## 2017-05-13 MED ORDER — ROCURONIUM BROMIDE 50 MG/5ML IV SOSY
PREFILLED_SYRINGE | INTRAVENOUS | Status: DC | PRN
  Administered 2017-05-13 (×2): 10 mg via INTRAVENOUS
  Administered 2017-05-13: 50 mg via INTRAVENOUS

## 2017-05-13 MED ORDER — SUCCINYLCHOLINE CHLORIDE 200 MG/10ML IV SOSY
PREFILLED_SYRINGE | INTRAVENOUS | Status: DC | PRN
  Administered 2017-05-13: 80 mg via INTRAVENOUS

## 2017-05-13 MED ORDER — PROMETHAZINE HCL 25 MG/ML IJ SOLN
12.5000 mg | Freq: Four times a day (QID) | INTRAMUSCULAR | Status: DC | PRN
  Administered 2017-05-14: 6.2 mg via INTRAVENOUS
  Filled 2017-05-13: qty 1

## 2017-05-13 MED ORDER — DOCUSATE SODIUM 100 MG PO CAPS
100.0000 mg | ORAL_CAPSULE | Freq: Two times a day (BID) | ORAL | Status: DC
  Administered 2017-05-14 (×2): 100 mg via ORAL
  Filled 2017-05-13 (×2): qty 1

## 2017-05-13 MED ORDER — ONDANSETRON 4 MG PO TBDP: 4.0000 mg | ORAL_TABLET | Freq: Four times a day (QID) | ORAL | Status: DC | PRN

## 2017-05-13 MED ORDER — DEXAMETHASONE SODIUM PHOSPHATE 10 MG/ML IJ SOLN
INTRAMUSCULAR | Status: AC
  Filled 2017-05-13: qty 1

## 2017-05-13 MED ORDER — ONDANSETRON HCL 4 MG/2ML IJ SOLN
4.0000 mg | Freq: Four times a day (QID) | INTRAMUSCULAR | Status: DC | PRN
  Administered 2017-05-13 – 2017-05-15 (×5): 4 mg via INTRAVENOUS
  Filled 2017-05-13 (×5): qty 2

## 2017-05-13 MED ORDER — SIMETHICONE 80 MG PO CHEW
40.0000 mg | CHEWABLE_TABLET | Freq: Four times a day (QID) | ORAL | Status: DC | PRN
  Administered 2017-05-14: 40 mg via ORAL
  Filled 2017-05-13: qty 1

## 2017-05-13 MED ORDER — ONDANSETRON HCL 4 MG/2ML IJ SOLN
INTRAMUSCULAR | Status: DC | PRN
  Administered 2017-05-13: 4 mg via INTRAVENOUS

## 2017-05-13 MED ORDER — BISACODYL 10 MG RE SUPP: 10.0000 mg | Freq: Every day | RECTAL | Status: DC | PRN

## 2017-05-13 MED ORDER — LIDOCAINE 2% (20 MG/ML) 5 ML SYRINGE
INTRAMUSCULAR | Status: AC
  Filled 2017-05-13: qty 5

## 2017-05-13 MED ORDER — FENTANYL CITRATE (PF) 250 MCG/5ML IJ SOLN
INTRAMUSCULAR | Status: AC
  Filled 2017-05-13: qty 5

## 2017-05-13 MED ORDER — EPHEDRINE SULFATE 50 MG/ML IJ SOLN
INTRAMUSCULAR | Status: DC | PRN
  Administered 2017-05-13 (×8): 5 mg via INTRAVENOUS
  Administered 2017-05-13 (×2): 10 mg via INTRAVENOUS

## 2017-05-13 MED ORDER — GABAPENTIN 300 MG PO CAPS
300.0000 mg | ORAL_CAPSULE | ORAL | Status: DC
  Filled 2017-05-13: qty 1

## 2017-05-13 MED ORDER — DIPHENHYDRAMINE HCL 50 MG/ML IJ SOLN: 12.5000 mg | Freq: Four times a day (QID) | INTRAMUSCULAR | Status: DC | PRN

## 2017-05-13 MED ORDER — SODIUM CHLORIDE 0.9 % IV SOLN
INTRAVENOUS | Status: DC
  Administered 2017-05-13: 18:00:00 via INTRAVENOUS
  Administered 2017-05-14: 1000 mL via INTRAVENOUS

## 2017-05-13 MED ORDER — HYDROMORPHONE HCL 1 MG/ML IJ SOLN
0.2500 mg | INTRAMUSCULAR | Status: DC | PRN
  Administered 2017-05-13 (×2): 0.5 mg via INTRAVENOUS

## 2017-05-13 MED ORDER — ENOXAPARIN SODIUM 40 MG/0.4ML ~~LOC~~ SOLN
40.0000 mg | SUBCUTANEOUS | Status: DC
  Administered 2017-05-14 – 2017-05-15 (×2): 40 mg via SUBCUTANEOUS
  Filled 2017-05-13 (×2): qty 0.4

## 2017-05-13 MED ORDER — SUMATRIPTAN SUCCINATE 50 MG PO TABS
50.0000 mg | ORAL_TABLET | ORAL | Status: DC | PRN
  Filled 2017-05-13: qty 1

## 2017-05-13 MED ORDER — GLYCOPYRROLATE 0.2 MG/ML IJ SOLN
INTRAMUSCULAR | Status: DC | PRN
  Administered 2017-05-13: 0.2 mg via INTRAVENOUS

## 2017-05-13 MED ORDER — METHOCARBAMOL 1000 MG/10ML IJ SOLN
500.0000 mg | Freq: Four times a day (QID) | INTRAVENOUS | Status: DC | PRN
  Administered 2017-05-13: 500 mg via INTRAVENOUS
  Filled 2017-05-13: qty 550

## 2017-05-13 MED ORDER — HYDRALAZINE HCL 20 MG/ML IJ SOLN
10.0000 mg | INTRAMUSCULAR | Status: DC | PRN
Start: 2017-05-13 — End: 2017-05-14

## 2017-05-13 MED ORDER — PANTOPRAZOLE SODIUM 40 MG PO TBEC
40.0000 mg | DELAYED_RELEASE_TABLET | Freq: Every day | ORAL | Status: DC
  Administered 2017-05-14: 40 mg via ORAL
  Filled 2017-05-13: qty 1

## 2017-05-13 MED ORDER — PROPOFOL 10 MG/ML IV BOLUS
INTRAVENOUS | Status: AC
  Filled 2017-05-13: qty 20

## 2017-05-13 MED ORDER — PROMETHAZINE HCL 25 MG/ML IJ SOLN: 12.5000 mg | Freq: Four times a day (QID) | INTRAMUSCULAR | Status: DC | PRN

## 2017-05-13 MED ORDER — ROCURONIUM BROMIDE 10 MG/ML (PF) SYRINGE
PREFILLED_SYRINGE | INTRAVENOUS | Status: AC
  Filled 2017-05-13: qty 5

## 2017-05-13 MED ORDER — FENTANYL CITRATE (PF) 100 MCG/2ML IJ SOLN
INTRAMUSCULAR | Status: DC | PRN
  Administered 2017-05-13 (×5): 50 ug via INTRAVENOUS

## 2017-05-13 MED ORDER — PROPOFOL 10 MG/ML IV BOLUS
INTRAVENOUS | Status: DC | PRN
  Administered 2017-05-13: 130 mg via INTRAVENOUS

## 2017-05-13 MED ORDER — CEFAZOLIN SODIUM-DEXTROSE 2-4 GM/100ML-% IV SOLN
2.0000 g | INTRAVENOUS | Status: AC
  Administered 2017-05-13: 2 g via INTRAVENOUS
  Filled 2017-05-13: qty 100

## 2017-05-13 MED ORDER — BUPIVACAINE LIPOSOME 1.3 % IJ SUSP
20.0000 mL | Freq: Once | INTRAMUSCULAR | Status: AC
  Administered 2017-05-13: 20 mL
  Filled 2017-05-13: qty 20

## 2017-05-13 MED ORDER — MIDAZOLAM HCL 2 MG/2ML IJ SOLN
INTRAMUSCULAR | Status: AC
  Filled 2017-05-13: qty 2

## 2017-05-13 MED ORDER — SUGAMMADEX SODIUM 200 MG/2ML IV SOLN
INTRAVENOUS | Status: DC | PRN
  Administered 2017-05-13: 150 mg via INTRAVENOUS

## 2017-05-13 MED ORDER — ACETAMINOPHEN 500 MG PO TABS
1000.0000 mg | ORAL_TABLET | Freq: Four times a day (QID) | ORAL | Status: DC
  Administered 2017-05-14: 1000 mg via ORAL
  Filled 2017-05-13: qty 2

## 2017-05-13 MED ORDER — SUGAMMADEX SODIUM 200 MG/2ML IV SOLN
INTRAVENOUS | Status: AC
  Filled 2017-05-13: qty 2

## 2017-05-13 MED ORDER — LACTATED RINGERS IV SOLN
INTRAVENOUS | Status: DC
  Administered 2017-05-13: 13:00:00 via INTRAVENOUS
  Administered 2017-05-13: 1000 mL via INTRAVENOUS

## 2017-05-13 MED ORDER — DIPHENHYDRAMINE HCL 12.5 MG/5ML PO ELIX: 12.5000 mg | ORAL_SOLUTION | Freq: Four times a day (QID) | ORAL | Status: DC | PRN

## 2017-05-13 MED ORDER — ONDANSETRON HCL 4 MG/2ML IJ SOLN
INTRAMUSCULAR | Status: AC
  Filled 2017-05-13: qty 2

## 2017-05-13 MED ORDER — METOCLOPRAMIDE HCL 5 MG/ML IJ SOLN
10.0000 mg | Freq: Four times a day (QID) | INTRAMUSCULAR | Status: DC
  Administered 2017-05-13 – 2017-05-15 (×6): 10 mg via INTRAVENOUS
  Filled 2017-05-13 (×6): qty 2

## 2017-05-13 MED FILL — LOSARTAN POTASSIUM 100 MG T: 100 | 30 days supply | Qty: 30 | Fill #0

## 2017-05-13 SURGICAL SUPPLY — 47 items
ADH SKN CLS APL DERMABOND .7 (GAUZE/BANDAGES/DRESSINGS) ×1
APPLIER CLIP ROT 10 11.4 M/L (STAPLE)
APR CLP MED LRG 11.4X10 (STAPLE)
CABLE HIGH FREQUENCY MONO STRZ (ELECTRODE) ×2 IMPLANT
CLIP APPLIE ROT 10 11.4 M/L (STAPLE) IMPLANT
COVER SURGICAL LIGHT HANDLE (MISCELLANEOUS) ×2 IMPLANT
DECANTER SPIKE VIAL GLASS SM (MISCELLANEOUS) ×2 IMPLANT
DERMABOND ADVANCED (GAUZE/BANDAGES/DRESSINGS) ×1
DERMABOND ADVANCED .7 DNX12 (GAUZE/BANDAGES/DRESSINGS) ×1 IMPLANT
DEVICE SUT QUICK LOAD TK 5 (STAPLE) ×3 IMPLANT
DEVICE SUT TI-KNOT TK 5X26 (MISCELLANEOUS) IMPLANT
DEVICE SUTURE ENDOST 10MM (ENDOMECHANICALS) IMPLANT
DISSECTOR BLUNT TIP ENDO 5MM (MISCELLANEOUS) ×2 IMPLANT
DRAIN PENROSE 18X1/2 LTX STRL (DRAIN) ×2 IMPLANT
DRAPE LAPAROSCOPIC ABDOMINAL (DRAPES) ×2 IMPLANT
ELECT REM PT RETURN 15FT ADLT (MISCELLANEOUS) ×2 IMPLANT
GLOVE BIO SURGEON STRL SZ 6 (GLOVE) ×2 IMPLANT
GLOVE INDICATOR 6.5 STRL GRN (GLOVE) ×2 IMPLANT
GOWN STRL REUS W/TWL LRG LVL3 (GOWN DISPOSABLE) ×2 IMPLANT
GOWN STRL REUS W/TWL XL LVL3 (GOWN DISPOSABLE) ×4 IMPLANT
IRRIG SUCT STRYKERFLOW 2 WTIP (MISCELLANEOUS) ×2
IRRIGATION SUCT STRKRFLW 2 WTP (MISCELLANEOUS) ×1 IMPLANT
KIT BASIN OR (CUSTOM PROCEDURE TRAY) ×2 IMPLANT
LEGGING LITHOTOMY PAIR STRL (DRAPES) ×2 IMPLANT
NDL INSUFFLATION 14GA 120MM (NEEDLE) ×1 IMPLANT
NEEDLE INSUFFLATION 14GA 120MM (NEEDLE) ×2 IMPLANT
POSITIONER SURGICAL ARM (MISCELLANEOUS) IMPLANT
SCISSORS LAP 5X35 DISP (ENDOMECHANICALS) ×2 IMPLANT
SCISSORS LAP 5X45 EPIX DISP (ENDOMECHANICALS) ×2 IMPLANT
SHEARS HARMONIC ACE PLUS 36CM (ENDOMECHANICALS) ×2 IMPLANT
SLEEVE XCEL OPT CAN 5 100 (ENDOMECHANICALS) ×4 IMPLANT
STAPLER VISISTAT 35W (STAPLE) ×2 IMPLANT
SUT ETHIBOND 0 36 GRN (SUTURE) ×1 IMPLANT
SUT MNCRL AB 4-0 PS2 18 (SUTURE) ×2 IMPLANT
SUT VIC AB 4-0 SH 18 (SUTURE) ×2 IMPLANT
TAPE CLOTH 4X10 WHT NS (GAUZE/BANDAGES/DRESSINGS) IMPLANT
TIP INNERVISION DETACH 40FR (MISCELLANEOUS) IMPLANT
TIP INNERVISION DETACH 50FR (MISCELLANEOUS) IMPLANT
TIP INNERVISION DETACH 56FR (MISCELLANEOUS) ×1 IMPLANT
TIPS INNERVISION DETACH 40FR (MISCELLANEOUS)
TOWEL OR 17X26 10 PK STRL BLUE (TOWEL DISPOSABLE) ×4 IMPLANT
TOWEL OR NON WOVEN STRL DISP B (DISPOSABLE) ×2 IMPLANT
TRAY FOLEY W/METER SILVER 16FR (SET/KITS/TRAYS/PACK) IMPLANT
TRAY LAPAROSCOPIC (CUSTOM PROCEDURE TRAY) ×2 IMPLANT
TROCAR BLADELESS OPT 5 100 (ENDOMECHANICALS) ×2 IMPLANT
TROCAR XCEL NON-BLD 11X100MML (ENDOMECHANICALS) ×2 IMPLANT
TUBING INSUF HEATED (TUBING) ×2 IMPLANT

## 2017-05-13 NOTE — Progress Notes (Signed)
Patient sitting on bedside commode for 20 minutes trying to urinate,patient report urge to pee and pressure on her bladder but nothing is coming out . Bladder scanner shows 600 cc urine ,explains In and out catheterization ,patient understand and agrees to proceed.  An out put of 500 cc after In and out cath. ,educated the patient about voiding trial ands shows understanding. We will continue to monitor.

## 2017-05-13 NOTE — Transfer of Care (Signed)
Immediate Anesthesia Transfer of Care Note  Patient: Jenny Bradford  Procedure(s) Performed: LAPAROSCOPIC PARAESOPHAGEAL HERNIA REPAIR WITH NISSEN FUNDOPLICATION (N/A Abdomen)  Patient Location: PACU  Anesthesia Type:General  Level of Consciousness: awake, alert , oriented and patient cooperative  Airway & Oxygen Therapy: Patient Spontanous Breathing and Patient connected to face mask oxygen  Post-op Assessment: Report given to RN, Post -op Vital signs reviewed and stable and Patient moving all extremities X 4  Post vital signs: stable  Last Vitals:  Vitals:   05/13/17 1014  BP: (!) (P) 146/78  Pulse: (P) 63  Resp: (P) 18  Temp: (P) 37 C  SpO2: (P) 100%    Last Pain:  Vitals:   05/13/17 1059  TempSrc:   PainSc: 0-No pain      Patients Stated Pain Goal: 4 (56/31/49 [id number])  Complications: No apparent anesthesia complications  Crepitus in neck and face. HOB 50 degrees to extubate. Extubated with no cough as requested by Dr. Amado Coe. After fentyl and lidocaine.. No stridor .

## 2017-05-13 NOTE — Anesthesia Postprocedure Evaluation (Signed)
Anesthesia Post Note  Patient: Jenny Bradford  Procedure(s) Performed: LAPAROSCOPIC PARAESOPHAGEAL HERNIA REPAIR WITH NISSEN FUNDOPLICATION (N/A Abdomen)     Patient location during evaluation: PACU Anesthesia Type: General Level of consciousness: awake and alert Pain management: pain level controlled Vital Signs Assessment: post-procedure vital signs reviewed and stable Respiratory status: spontaneous breathing, nonlabored ventilation, respiratory function stable and patient connected to nasal cannula oxygen Cardiovascular status: blood pressure returned to baseline and stable Postop Assessment: no apparent nausea or vomiting Anesthetic complications: no    Last Vitals:  Vitals:   05/13/17 1515 05/13/17 1600  BP: (!) 162/78   Pulse: 90   Resp: 16 (P) 13  Temp:    SpO2: 100%     Last Pain:  Vitals:   05/13/17 1615  TempSrc:   PainSc: (P) 7                  Lovene Maret EDWARD

## 2017-05-13 NOTE — Discharge Instructions (Signed)
EATING AFTER YOUR ESOPHAGEAL SURGERY (Stomach Fundoplication, Hiatal Hernia repair, Achalasia surgery, etc)  ######################################################################  EAT Start with a pureed / full liquid diet (see below) Gradually transition to a high fiber diet with a fiber supplement over the next month after discharge.    WALK Walk an hour a day.  Control your pain to do that.    CONTROL PAIN Control pain so that you can walk, sleep, tolerate sneezing/coughing, go up/down stairs.  HAVE A BOWEL MOVEMENT DAILY Keep your bowels regular to avoid problems.  OK to try a laxative to override constipation.  OK to use an antidairrheal to slow down diarrhea.  Call if not better after 2 tries  CALL IF YOU HAVE PROBLEMS/CONCERNS Call if you are still struggling despite following these instructions. Call if you have concerns not answered by these instructions  ######################################################################   After your esophageal surgery, expect some sticking with swallowing over the next 1-2 months.    If food sticks when you eat, it is called "dysphagia".  This is due to swelling around your esophagus at the wrap & hiatal diaphragm repair.  It will gradually ease off over the next few months.  To help you through this temporary phase, we start you out on a pureed (blenderized) diet.  Your first meal in the hospital was thin liquids.  You should have been given a pureed diet by the time you left the hospital.  We ask patients to stay on a pureed diet for the first 2-3 weeks to avoid anything getting "stuck" near your recent surgery.  Don't be alarmed if your ability to swallow doesn't progress according to this plan.  Everyone is different and some diets can advance more or less quickly.     Some BASIC RULES to follow are:  Maintain an upright position whenever eating or drinking.  Take small bites - just a teaspoon size bite at a time.  Eat slowly.   It may also help to eat only one food at a time.  Consider nibbling through smaller, more frequent meals & avoid the urge to eat BIG meals  Do not push through feelings of fullness, nausea, or bloatedness  Do not mix solid foods and liquids in the same mouthful  Try not to "wash foods down" with large gulps of liquids.  Avoid carbonated (bubbly/fizzy) drinks.    Avoid foods that make you feel gassy or bloated.  Start with bland foods first.  Wait on trying greasy, fried, or spicy meals until you are tolerating more bland solids well.  Understand that it will be hard to burp and belch at first.  This gradually improves with time.  Expect to be more gassy/flatulent/bloated initially.  Walking will help your body manage it better.  Consider using medications for bloating that contain simethicone such as  Maalox or Gas-X   Eat in a relaxed atmosphere & minimize distractions.  Avoid talking while eating.    Do not use straws.  Following each meal, sit in an upright position (90 degree angle) for 60 to 90 minutes.  Going for a short walk can help as well  If food does stick, don't panic.  Try to relax and let the food pass on its own.  Sipping WARM LIQUID such as strong hot black tea can also help slide it down.   Be gradual in changes & use common sense:  -If you easily tolerating a certain "level" of foods, advance to the next level gradually -If you are  having trouble swallowing a particular food, then avoid it.   -If food is sticking when you advance your diet, go back to thinner previous diet (the lower LEVEL) for 1-2 days.  LEVEL 1 = PUREED DIET  Do for the first 2 WEEKS AFTER SURGERY  -Foods in this group are pureed or blenderized to a smooth, mashed potato-like consistency.  -If necessary, the pureed foods can keep their shape with the addition of a thickening agent.   -Meat should be pureed to a smooth, pasty consistency.  Hot broth or gravy may be added to the pureed  meat, approximately 1 oz. of liquid per 3 oz. serving of meat. -CAUTION:  If any foods do not puree into a smooth consistency, swallowing will be more difficult.  (For example, nuts or seeds sometimes do not blend well.)  Hot Foods Cold Foods  Pureed scrambled eggs and cheese Pureed cottage cheese  Baby cereals Thickened juices and nectars  Thinned cooked cereals (no lumps) Thickened milk or eggnog  Pureed [place] toast or pancakes Ensure  Mashed potatoes Ice cream  Pureed parsley, au gratin, scalloped potatoes, candied sweet potatoes Fruit or [place] ice, sherbet  Pureed buttered or alfredo noodles Plain yogurt  Pureed vegetables (no corn or peas) Instant breakfast  Pureed soups and creamed soups Smooth pudding, mousse, custard  Pureed scalloped apples Whipped gelatin  Gravies Sugar, syrup, honey, jelly  Sauces, cheese, tomato, barbecue, white, creamed Cream  Any baby food Creamer  Alcohol in moderation (not beer or champagne) Margarine  Coffee or tea Mayonnaise   Ketchup, mustard   Apple sauce   SAMPLE MENU:  PUREED DIET Breakfast Lunch Dinner   Orange juice, 1/2 cup  Cream of wheat, 1/2 cup  Pineapple juice, 1/2 cup  Pureed [place], barley soup, 3/4 cup  Pureed Hawaiian chicken, 3 oz   Scrambled eggs, mashed or blended with cheese, 1/2 cup  Tea or coffee, 1 cup   Whole milk, 1 cup   Non-dairy creamer, 2 Tbsp.  Mashed potatoes, 1/2 cup  Pureed cooled broccoli, 1/2 cup  Apple sauce, 1/2 cup  Coffee or tea  Mashed potatoes, 1/2 cup  Pureed spinach, 1/2 cup  Frozen yogurt, 1/2 cup  Tea or coffee      LEVEL 2 = SOFT DIET  After your first 2 weeks, you can advance to a soft diet.   Keep on this diet until everything goes down easily.  Hot Foods Cold Foods  White fish Cottage cheese  Stuffed fish Junior baby fruit  Baby food meals Semi thickened juices  Minced soft cooked, scrambled, poached eggs nectars  Souffle & omelets Ripe mashed bananas  Cooked  cereals Canned fruit, pineapple sauce, milk  potatoes Milkshake  Buttered or Alfredo noodles Custard  Cooked cooled vegetable Puddings, including tapioca  Sherbet Yogurt  Vegetable soup or alphabet soup Fruit ice, [place] ice  Gravies Whipped gelatin  Sugar, syrup, honey, jelly Junior baby desserts  Sauces:  Cheese, creamed, barbecue, tomato, white Cream  Coffee or tea Margarine   SAMPLE MENU:  LEVEL 2 Breakfast Lunch Dinner   Orange juice, 1/2 cup  Oatmeal, 1/2 cup  Scrambled eggs with cheese, 1/2 cup  Decaffeinated tea, 1 cup  Whole milk, 1 cup  Non-dairy creamer, 2 Tbsp  Pineapple juice, 1/2 cup  Minced beef, 3 oz  Gravy, 2 Tbsp  Mashed potatoes, 1/2 cup  Minced fresh broccoli, 1/2 cup  Applesauce, 1/2 cup  Coffee, 1 cup  [place], barley soup, 3/4 cup  Minced Hawaiian chicken, 3 oz  Mashed potatoes, 1/2 cup  Cooked spinach, 1/2 cup  Frozen yogurt, 1/2 cup  Non-dairy creamer, 2 Tbsp      LEVEL 3 = CHOPPED DIET  -After all the foods in level 2 (soft diet) are passing through well you should advance up to more chopped foods.  -It is still important to cut these foods into small pieces and eat slowly.  Hot Foods Cold Foods  Poultry Cottage cheese  Chopped Swedish meatballs Yogurt  Meat salads (ground or flaked meat) Milk  Flaked fish (tuna) Milkshakes  Poached or scrambled eggs Soft, cold, dry cereal  Souffles and omelets Fruit juices or nectars  Cooked cereals Chopped canned fruit  Chopped [place] toast or pancakes Canned fruit cocktail  Noodles or pasta (no rice) Pudding, mousse, custard  Cooked vegetables (no frozen peas, corn, or mixed vegetables) Green salad  Canned small sweet peas Ice cream  Creamed soup or vegetable soup Fruit ice, [place] ice  Pureed vegetable soup or alphabet soup Non-dairy creamer  Ground scalloped apples Margarine  Gravies Mayonnaise  Sauces:  Cheese, creamed, barbecue, tomato, white Ketchup  Coffee or tea Mustard    SAMPLE MENU:  LEVEL 3 Breakfast Lunch Dinner   Orange juice, 1/2 cup  Oatmeal, 1/2 cup  Scrambled eggs with cheese, 1/2 cup  Decaffeinated tea, 1 cup  Whole milk, 1 cup  Non-dairy creamer, 2 Tbsp  Ketchup, 1 Tbsp  Margarine, 1 tsp  Salt, 1/4 tsp  Sugar, 2 tsp  Pineapple juice, 1/2 cup  Ground beef, 3 oz  Gravy, 2 Tbsp  Mashed potatoes, 1/2 cup  Cooked spinach, 1/2 cup  Applesauce, 1/2 cup  Decaffeinated coffee  Whole milk  Non-dairy creamer, 2 Tbsp  Margarine, 1 tsp  Salt, 1/4 tsp  Pureed [place], barley soup, 3/4 cup  Barbecue chicken, 3 oz  Mashed potatoes, 1/2 cup  Ground fresh broccoli, 1/2 cup  Frozen yogurt, 1/2 cup  Decaffeinated tea, 1 cup  Non-dairy creamer, 2 Tbsp  Margarine, 1 tsp  Salt, 1/4 tsp  Sugar, 1 tsp    LEVEL 4:  REGULAR FOODS  -Foods in this group are soft, moist, regularly textured foods.   -This level includes meat and breads, which tend to be the hardest things to swallow.   -Eat very slowly, chew well and continue to avoid carbonated drinks. -most people are at this level in 4-6 weeks  Hot Foods Cold Foods  Baked fish or skinned Soft cheeses - cottage cheese  Souffles and omelets Cream cheese  Eggs Yogurt  Stuffed shells Milk  Spaghetti with meat sauce Milkshakes  Cooked cereal Cold dry cereals (no nuts, dried fruit, coconut)  [place] toast or pancakes Crackers  Buttered toast Fruit juices or nectars  Noodles or pasta (no rice) Canned fruit  Potatoes (all types) Ripe bananas  Soft, cooked vegetables (no corn, lima, or baked beans) Peeled, ripe, fresh fruit  Creamed soups or vegetable soup Cakes (no nuts, dried fruit, coconut)  Canned chicken noodle soup Plain doughnuts  Gravies Ice cream  Bacon dressing Pudding, mousse, custard  Sauces:  Cheese, creamed, barbecue, tomato, white Fruit ice, [place] ice, sherbet  Decaffeinated tea or coffee Whipped gelatin  Pork chops Regular gelatin   Canned fruited  gelatin molds   Sugar, syrup, honey, jam, jelly   Cream   Non-dairy   Margarine   Oil   Mayonnaise   Ketchup   Mustard   TROUBLESHOOTING IRREGULAR BOWELS  1) Avoid extremes of bowel  movements (no bad constipation/diarrhea)  2) Miralax 17gm mixed in Fidelis. water or juice-daily. May use BID as needed.  3) Gas-x,Phazyme, etc. as needed for gas & bloating.  4) Soft,bland diet. No spicy,greasy,fried foods.  5) Prilosec over-the-counter as needed  6) May hold gluten/wheat products from diet to see if symptoms improve.  7) May try probiotics (Align, Activa, etc) to help calm the bowels down  7) If symptoms become worse call back immediately.    If you have any questions please call our office at [place]: [phone].  LAPAROSCOPIC SURGERY: POST OP INSTRUCTIONS  1. Take your usually prescribed home medications unless otherwise directed. 2. PAIN CONTROL: a. Pain is best controlled by a usual combination of three different methods TOGETHER: i. Ice/Heat ii. Over the counter pain medication iii. Prescription pain medication b. Most patients will experience some swelling and bruising around the incisions.  Ice packs or heating pads (30-60 minutes up to 6 times a day) will help. Use ice for the first few days to help decrease swelling and bruising, then switch to heat to help relax tight/sore spots and speed recovery.  Some people prefer to use ice alone, heat alone, alternating between ice & heat.  Experiment to what works for you.  Swelling and bruising can take several weeks to resolve.   c. It is helpful to take an over-the-counter pain medication regularly for the first few weeks.  Choose one of the following that works best for you: i. Naproxen (Aleve, etc)  Two 220mg  tabs twice a day ii. Ibuprofen (Advil, etc) Three 200mg  tabs four times a day (every meal & bedtime) iii. Acetaminophen (Tylenol, etc) 500-650mg  four times a day (every meal & bedtime) d. A  prescription for  pain medication (such as oxycodone, hydrocodone, etc) should be given to you upon discharge.  Take your pain medication as prescribed.  i. If you are having problems/concerns with the prescription medicine (does not control pain, nausea, vomiting, rash, itching, etc), please call us [phone] to see if we need to switch you to a different pain medicine that will work better for you and/or control your side effect better. ii. If you need a refill on your pain medication, please contact your pharmacy.  They will contact our office to request authorization. Prescriptions will not be filled after 5 pm or on week-ends. 3. Avoid getting constipated.  Between the surgery and the pain medications, it is common to experience some constipation.  Increasing fluid intake and taking a fiber supplement (such as Metamucil, Citrucel, FiberCon, MiraLax, etc) 1-2 times a day regularly will usually help prevent this problem from occurring.  A mild laxative (prune juice, Milk of Magnesia, MiraLax, etc) should be taken according to package directions if there are no bowel movements after 48 hours.   4. Watch out for diarrhea.  If you have many loose bowel movements, simplify your diet to bland foods & liquids for a few days.  Stop any stool softeners and decrease your fiber supplement.  Switching to mild anti-diarrheal medications (Kayopectate, Pepto Bismol) can help.  If this worsens or does not improve, please call us. 5. Wash / shower every day.  You may shower over the skin glue which is waterproof.   6. Skin glue will flake off after about 2 weeks.  You may leave the incision open to air.  You may replace a dressing/Band-Aid to cover the incision for comfort if you wish.  7. ACTIVITIES as tolerated:   a.  You may resume regular (light) daily activities beginning the next day--such as daily self-care, walking, climbing stairs--gradually increasing activities as tolerated.  If you can walk 30 minutes without difficulty,  it is safe to try more intense activity such as jogging, treadmill, bicycling, low-impact aerobics, swimming, etc. b. Refrain from the most intensive and strenuous activity such as sit-ups, heavy lifting, contact sports, etc  Refrain from any heavy lifting or straining until 6 weeks after surgery  c. DO NOT PUSH THROUGH PAIN.  Let pain be your guide: If it hurts to do something, don't do it.  Pain is your body warning you to avoid that activity for another week until the pain goes down. d. You may drive when you are no longer taking prescription pain medication, you can comfortably wear a seatbelt, and you can safely maneuver your car and apply brakes. e. Dennis Bast may have sexual intercourse when it is comfortable.  8. FOLLOW UP in our office a. Please call [facility] at [phone] to set up an appointment to see your surgeon in the office for a follow-up appointment approximately 2-3 weeks after your surgery. b. Make sure that you call for this appointment the day you arrive home to insure a convenient appointment time. 10. IF YOU HAVE DISABILITY OR FAMILY LEAVE FORMS, BRING THEM TO THE OFFICE FOR PROCESSING.  DO NOT GIVE THEM TO YOUR DOCTOR.   WHEN TO CALL us [phone]: 1. Poor pain control 2. Reactions / problems with new medications (rash/itching, nausea, etc)  3. Fever over 101.5 F (38.5 C) 4. Inability to urinate 5. Nausea and/or vomiting 6. Worsening swelling or bruising 7. Continued bleeding from incision. 8. Increased pain, redness, or drainage from the incision   The clinic staff is available to answer your questions during regular business hours (8:30am-5pm).  Please dont hesitate to call and ask to speak to one of our nurses for clinical concerns.   If you have a medical emergency, go to the nearest emergency room or call 911.  A surgeon from [facility] is always on call at the [facility], [place], [place], [place],  [place]  [address] ? MAIN: [phone] ? TOLL FREE: [phone] ?  FAX [phone] [email or web address]

## 2017-05-13 NOTE — Op Note (Addendum)
Operative Note  Jenny Bradford  [id number]  [id number]  05/13/2017   Surgeon: Victorino Sparrow ConnorMD  Assistant: Kaylyn Lim MD  Procedure performed: Laparoscopic paraesophageal hernia repair, Nissen Fundoplication  Preop diagnosis: paraesophageal hernia Post-op diagnosis/intraop findings: large paraesophageal hernia with entirely intrathoracic stomach  Specimens: none Retained items: none EBL: 30ZS Complications: none  Description of procedure: After obtaining informed consent the patient was taken to the operating room and placed supine on operating room table wheregeneral endotracheal anesthesia was initiated, preoperative antibiotics were administered, SCDs applied, and a formal timeout was performed. The abdomen was prepped and draped in the usual sterile fashion. The peritoneal cavity was entered with a veress needle just below the umbilicus and insufflated to 17mmHg. A 25mm trocar and camera were then placed 15cm inferior to the xyphoid and a couple centimeteres to the left of midline. Gross inspection revealed no evidence of injury from entry. Under direct visualization, a 5 mm trocar was placed in the right midclavicular line a few centimeters below the costal margin, an 11 mm trocar was placed in the left midclavicular line a few centimeter below the costal margin and a lateral left 5 mm trochar was placed in the anterior axillary line. A subxiphoid incision was made and the liver retractor introduced for fixed retraction of the left lobe. The patient was then placed in steep reverse Trendelenburg and we began our dissection by dividing the sac away from the right crus, proceeding anteriorly and around to the left crus. Gentle blunt dissection was employed to reduce the very large hernia sac into the abdomen. We then divided the short gastrics using the Harmonic scalpel. This was carried all the way to the left crus which was then cleared off and the dissection of the hernia sac  connected to the previous area on the left crus. At this point with the sac completely dissected the stomach was reduced into the abdomen where the it now rested without tension. I completed the circumferential dissection of the distal esophagus by dividing some strands of the sac posteriorly taking care to preserve the esophagus and vagus. Once circumferential dissection was complete a Penrose was placed around the GE junction and used for retraction. The mediastinal attachments of the esophagus were dissected bluntly in order to improve our intra-abdominal esophageal length. Once we had reached 3 cm of tension-free intra-abdominal esophageal length, I completed that part of the dissection. At this point the crura were reapproximated with interrupted 0 Ethibonds with pledgets on the posterior 2 sutures. A total of 5 sutures were used, the 3 most superior of which did not have a pledget. This narrowed the hiatus nicely to about 2-1/2 cm in diameter. A 56 French lighted bougie was then introduced under direct visualization. The hiatus was noted to be still quite loose around this and so an anterior crural stitch was added to further narrow the hiatus. The fundus of the stomach was grasped from the right side and pulled to wrap around the distal esophagus. Shoeshine maneuver demonstrated appropriate orientation of the fundus. The wrap was completed with 3 interrupted sutures of 0 silk, the superior and inferior sutures grabbing a bite of esophageal wall. The bougie was removed. The final wrap was about 1.5 cm in length and easily accommodated a grasper. The bougie was removed and our wrap inspected it sat nicely within the abdomen without any tension. The abdomen was then inspected for hemostasis which was found to be adequate. No evidence of other injuries. The liver retractor  was removed under direct visualization. The liver was inspected and no abnormal lesions were present. The abdomen was then desufflated and all  trochars removed. Our skin incisions were closed with running subcuticular Monocryl after infiltrating with local. Dermabond was then applied. The patient was then awakened, extubated and taken to PACU in stable condition.   All counts were correct at the completion of the case.

## 2017-05-13 NOTE — Anesthesia Preprocedure Evaluation (Signed)
Anesthesia Evaluation  Patient identified by MRN, date of birth, ID band Patient awake    Reviewed: Allergy & Precautions, H&P , Patient's Chart, lab work & pertinent test results, reviewed documented beta blocker date and time   Airway Mallampati: II  TM Distance: >3 FB Neck ROM: full    Dental no notable dental hx.    Pulmonary sleep apnea ,    Pulmonary exam normal breath sounds clear to auscultation       Cardiovascular hypertension, On Medications Valvular problems/murmurs: Both valves with moderate regurgitation. AI and MR  Rhythm:regular Rate:Normal     Neuro/Psych    GI/Hepatic   Endo/Other    Renal/GU      Musculoskeletal   Abdominal   Peds  Hematology   Anesthesia Other Findings Good EF on echo  Reproductive/Obstetrics                             Anesthesia Physical Anesthesia Plan  ASA: III  Anesthesia Plan: General   Post-op Pain Management:    Induction: Intravenous  PONV Risk Score and Plan: 2 and Dexamethasone, Ondansetron and Treatment may vary due to age or medical condition  Airway Management Planned: Oral ETT  Additional Equipment:   Intra-op Plan:   Post-operative Plan: Extubation in OR  Informed Consent: I have reviewed the patients History and Physical, chart, labs and discussed the procedure including the risks, benefits and alternatives for the proposed anesthesia with the patient or authorized representative who has indicated his/her understanding and acceptance.   Dental Advisory Given  Plan Discussed with: CRNA and Surgeon  Anesthesia Plan Comments: (  )        Anesthesia Quick Evaluation

## 2017-05-13 NOTE — Interval H&P Note (Signed)
History and Physical Interval Note:  05/13/2017 10:54 AM  Jenny Bradford  has presented today for surgery, with the diagnosis of paraesophageal hernia  The various methods of treatment have been discussed with the patient and family. After consideration of risks, benefits and other options for treatment, the patient has consented to  Procedure(s): LAPAROSCOPIC PARAESOPHAGEAL HERNIA REPAIR WITH NISSEN FUNDOPLICATION (N/A) as a surgical intervention .  The patient's history has been reviewed, patient examined, no change in status, stable for surgery.  I have reviewed the patient's chart and labs.  Questions were answered to the patient's satisfaction.     Alanta Scobey Rich Brave

## 2017-05-13 NOTE — Anesthesia Procedure Notes (Addendum)
Procedure Name: Intubation Date/Time: 05/13/2017 12:00 PM Performed by: Maxwell Caul, CRNA Pre-anesthesia Checklist: Patient identified, Emergency Drugs available, Suction available and Patient being monitored Patient Re-evaluated:Patient Re-evaluated prior to induction Oxygen Delivery Method: Circle system utilized Preoxygenation: Pre-oxygenation with 100% oxygen Induction Type: IV induction, Rapid sequence and Cricoid Pressure applied Laryngoscope Size: Mac and 4 Grade View: Grade I Tube type: Oral Tube size: 7.5 mm Number of attempts: 1 Airway Equipment and Method: Stylet Placement Confirmation: ETT inserted through vocal cords under direct vision,  positive ETCO2 and breath sounds checked- equal and bilateral Secured at: 21 cm Tube secured with: Tape Dental Injury: Teeth and Oropharynx as per pre-operative assessment

## 2017-05-14 ENCOUNTER — Inpatient Hospital Stay (HOSPITAL_COMMUNITY): Payer: 59

## 2017-05-14 LAB — BASIC METABOLIC PANEL
Anion gap: 9 (ref 5–15)
BUN: 11 mg/dL (ref 6–20)
CALCIUM: 8.4 mg/dL — AB (ref 8.9–10.3)
CO2: 25 mmol/L (ref 22–32)
Chloride: 98 mmol/L — ABNORMAL LOW (ref 101–111)
Creatinine, Ser: 0.59 mg/dL (ref 0.44–1.00)
Glucose, Bld: 129 mg/dL — ABNORMAL HIGH (ref 65–99)
POTASSIUM: 4 mmol/L (ref 3.5–5.1)
Sodium: 132 mmol/L — ABNORMAL LOW (ref 135–145)

## 2017-05-14 LAB — CBC
HCT: 34.7 % — ABNORMAL LOW (ref 36.0–46.0)
HEMOGLOBIN: 11.5 g/dL — AB (ref 12.0–15.0)
MCH: 32 pg (ref 26.0–34.0)
MCHC: 33.1 g/dL (ref 30.0–36.0)
MCV: 96.7 fL (ref 78.0–100.0)
PLATELETS: 215 10*3/uL (ref 150–400)
RBC: 3.59 MIL/uL — AB (ref 3.87–5.11)
RDW: 13.1 % (ref 11.5–15.5)
WBC: 16.4 10*3/uL — AB (ref 4.0–10.5)

## 2017-05-14 LAB — MAGNESIUM: Magnesium: 1.6 mg/dL — ABNORMAL LOW (ref 1.7–2.4)

## 2017-05-14 MED ORDER — PROMETHAZINE HCL 25 MG/ML IJ SOLN
6.2500 mg | Freq: Four times a day (QID) | INTRAMUSCULAR | Status: DC | PRN
  Administered 2017-05-14 (×2): 6.25 mg via INTRAVENOUS
  Filled 2017-05-14 (×2): qty 1

## 2017-05-14 MED ORDER — MAGNESIUM SULFATE 4 GM/100ML IV SOLN
4.0000 g | Freq: Once | INTRAVENOUS | Status: AC
  Administered 2017-05-14: 4 g via INTRAVENOUS
  Filled 2017-05-14: qty 100

## 2017-05-14 MED ORDER — LORAZEPAM 2 MG/ML IJ SOLN: 0.5000 mg | Freq: Four times a day (QID) | INTRAMUSCULAR | Status: DC | PRN

## 2017-05-14 MED ORDER — DEXTROSE-NACL 5-0.45 % IV SOLN
INTRAVENOUS | Status: DC
  Administered 2017-05-14: 100 mL/h via INTRAVENOUS
  Administered 2017-05-14 – 2017-05-15 (×2): via INTRAVENOUS

## 2017-05-14 MED ORDER — TRAMADOL 5 MG/ML ORAL SUSPENSION: 50.0000 mg | Freq: Four times a day (QID) | ORAL | Status: DC | PRN

## 2017-05-14 MED ORDER — IOPAMIDOL (ISOVUE-300) INJECTION 61%
100.0000 mL | Freq: Once | INTRAVENOUS | Status: AC | PRN
  Administered 2017-05-14: 100 mL via ORAL

## 2017-05-14 MED ORDER — OXYCODONE HCL 5 MG/5ML PO SOLN: 5.0000 mg | ORAL | Status: DC | PRN

## 2017-05-14 MED ORDER — ACETAMINOPHEN 160 MG/5ML PO SOLN
650.0000 mg | Freq: Four times a day (QID) | ORAL | Status: DC
  Administered 2017-05-14 (×2): 650 mg via ORAL
  Filled 2017-05-14 (×3): qty 20.3

## 2017-05-14 MED ORDER — BOOST / RESOURCE BREEZE PO LIQD CUSTOM
1.0000 | Freq: Three times a day (TID) | ORAL | Status: DC
  Administered 2017-05-14 (×2): 1 via ORAL

## 2017-05-14 MED ORDER — HYDRALAZINE HCL 20 MG/ML IJ SOLN: 10.0000 mg | INTRAMUSCULAR | Status: DC | PRN

## 2017-05-14 MED ORDER — TRAMADOL HCL 50 MG PO TABS
50.0000 mg | ORAL_TABLET | Freq: Four times a day (QID) | ORAL | Status: DC | PRN
  Administered 2017-05-14: 50 mg via ORAL
  Filled 2017-05-14 (×2): qty 1

## 2017-05-14 NOTE — Progress Notes (Addendum)
S: Had a horrible night- did not sleep, had shaking which she routinely has when she doesn't eat, had issues with nausea. Lost her IV and it took hours to get a new one. Has only had some water overnight. Denies any dysphagia or issues there. Sipping on full liquid breakfast now. No wretching/emesis/coughing. Pulls 1750 on IS. Has not been out of bed because she is "too weak". Pain control is OK.   Vitals, labs, intake/output, and orders reviewed at this time. Tmax 98.6. HR 86, RR 17, normo- to hypertensive. Sats 97% on nasal canula. BMP- hypomagnesemia 1.6, otherwise unremarkable. Blood glucose 129. CBC- WBC 16.4 (7.3), hgb 11.5 (13.6), Plt 215 (292). PO intake 180cc. UOP 1100 (straight cath x 1 for 512mL, then voided on her own)  Gen: A&Ox3, no distress  H&N: EOMI, atraumatic, neck supple Chest: unlabored respirations, RRR Abd: soft, appropriately minimally tender, nondistended, incisions c/d/i with dermabond. Some bruising around left abdominal port site.  Ext: warm, no edema Neuro: grossly normal  Lines/tubes/drains: PIV  A/P:  POD 1 laparoscopic repair of very large paraesophageal hernia with nissen fundoplication.  -Change IVF to d5 half NS @100 , Change oral meds to liquids, Added ativan for breakthrough nausea. Advised she can alternate phenergan/ zofran every 3 hours if she needs to, and advised to not let the nausea get severe before she uses a PRN. Continue scheduled reglan.  -Replace magnesium IV -Expected post-op day 1 leukocytosis -Seems to be tolerating full liquids so far although has not had much. No dysphagia or sticking. Will do puree for lunch- she will stay on this for the next couple weeks.  -Needs to mobilize today. Will order physical therapy.  -UGI today.   Plan discharge home tomorrow if she improves.    Addendum 10:30: UGI reviewed. Wrap intact and stomach below diaphragm, no leak. There is expected edema and narrowing at the level of the wrap. The esophagus is  dilated- looks like it was somewhat dilated on preop study but more now. Clinically she has had no dysphagia. Will let her try puree diet today, if develops dysphagia will go down to fulls again and stay there for a couple weeks to let the edema subside.  Romana Juniper, MD [facility], [place] Pager [phone]

## 2017-05-14 NOTE — Evaluation (Signed)
Physical Therapy One Time Evaluation Patient Details Name: Jenny Bradford MRN: [id number] DOB: [birth year] Today's Date: 05/14/2017   History of Present Illness  Pt is a 67 year old female s/p laparoscopic repair of very large paraesophageal hernia with nissen fundoplication 11/17/31 with recent hx of Left Left Thumb Ligament Reconstruction and Tendon Interposition surgery 03/19/2017  Clinical Impression  Patient evaluated by Physical Therapy with no further acute PT needs identified. All education has been completed and the patient has no further questions.  Pt tolerated ambulating around unit well and encouraged to perform at least once more later today/tonight (RN aware).  Spouse present and assisting pt with lines. See below for any follow-up Physical Therapy or equipment needs. (Pt reports receiving PT already for recent L hand/wrist surgery).  PT is signing off. Thank you for this referral.     Follow Up Recommendations No PT follow up    Equipment Recommendations  None recommended by PT    Recommendations for Other Services       Precautions / Restrictions Precautions Required Braces or Orthoses: Other Brace/Splint Other Brace/Splint: s/p L hand/wrist surgery with hard brace in place      Mobility  Bed Mobility Overal bed mobility: Modified Independent                Transfers Overall transfer level: Modified independent Equipment used: None Transfers: Sit to/from Stand              Ambulation/Gait Ambulation/Gait assistance: Supervision;Modified independent (Device/Increase time) Ambulation Distance (Feet): 400 Feet Assistive device: None Gait Pattern/deviations: WFL(Within Functional Limits)     General Gait Details: no deficits or obvious deviation observed, no LOB or unsteadiness, reports mild dizziness from phenergan  Stairs            Wheelchair Mobility    Modified Rankin (Stroke Patients Only)       Balance                                              Pertinent Vitals/Pain Pain Assessment: (no c/o pain, mostly just states she needs sleep, just received phenergan) Pain Intervention(s): Monitored during session    Home Living Family/patient expects to be discharged to:: Private residence Living Arrangements: Spouse/significant other Available Help at Discharge: Family Type of Home: House Home Access: Stairs to enter     Home Layout: Able to live on main level with bedroom/bathroom Home Equipment: None      Prior Function Level of Independence: Independent               Hand Dominance        Extremity/Trunk Assessment   Upper Extremity Assessment Upper Extremity Assessment: LUE deficits/detail LUE Deficits / Details: L hand/wrist brace - receiving PT     Lower Extremity Assessment Lower Extremity Assessment: Overall WFL for tasks assessed       Communication   Communication: No difficulties  Cognition Arousal/Alertness: Awake/alert Behavior During Therapy: WFL for tasks assessed/performed Overall Cognitive Status: Within Functional Limits for tasks assessed                                        General Comments      Exercises     Assessment/Plan    PT Assessment Patent  does not need any further PT services  PT Problem List         PT Treatment Interventions      PT Goals (Current goals can be found in the Care Plan section)  Acute Rehab PT Goals PT Goal Formulation: All assessment and education complete, DC therapy    Frequency     Barriers to discharge        Co-evaluation               AM-PAC PT "6 Clicks" Daily Activity  Outcome Measure Difficulty turning over in bed (including adjusting bedclothes, sheets and blankets)?: None Difficulty moving from lying on back to sitting on the side of the bed? : None Difficulty sitting down on and standing up from a chair with arms (e.g., wheelchair, bedside commode, etc,.)?: None Help  needed moving to and from a bed to chair (including a wheelchair)?: None Help needed walking in hospital room?: None Help needed climbing 3-5 steps with a railing? : None 6 Click Score: 24    End of Session   Activity Tolerance: Patient tolerated treatment well Patient left: in bed;with call bell/phone within reach;with family/visitor present Nurse Communication: Mobility status PT Visit Diagnosis: Difficulty in walking, not elsewhere classified (R26.2)    Time: 5631-4970 PT Time Calculation (min) (ACUTE ONLY): 14 min   Charges:   PT Evaluation $PT Eval Low Complexity: 1 Low     PT G CodesCarmelia Bake, PT, DPT 05/14/2017 Pager: [phone]  York Ram E 05/14/2017, 3:55 PM

## 2017-05-15 MED ORDER — METOCLOPRAMIDE HCL 10 MG PO TABS: 10.0000 mg | ORAL_TABLET | Freq: Three times a day (TID) | ORAL | 1 refills | Status: DC

## 2017-05-15 MED ORDER — TRAMADOL HCL 50 MG PO TABS: 50.0000 mg | ORAL_TABLET | Freq: Four times a day (QID) | ORAL | 0 refills | Status: DC | PRN

## 2017-05-15 MED ORDER — PROMETHAZINE HCL 25 MG PO TABS: 25.0000 mg | ORAL_TABLET | Freq: Four times a day (QID) | ORAL | 0 refills | Status: DC | PRN

## 2017-05-15 MED ORDER — SIMETHICONE 80 MG PO CHEW: 80.0000 mg | CHEWABLE_TABLET | Freq: Four times a day (QID) | ORAL | 0 refills | Status: AC | PRN

## 2017-05-15 MED ORDER — ONDANSETRON 4 MG PO TBDP: 4.0000 mg | ORAL_TABLET | Freq: Four times a day (QID) | ORAL | 2 refills | Status: DC | PRN

## 2017-05-15 MED FILL — PROMETHAZINE 25 MG TABLET: 25 | 7 days supply | Qty: 30 | Fill #0

## 2017-05-15 MED FILL — ONDANSETRON ODT 4 MG TABLET: 4 | 5 days supply | Qty: 20 | Fill #0

## 2017-05-15 MED FILL — METOCLOPRAMIDE 10 MG TABLET: 10 | 30 days supply | Qty: 90 | Fill #0

## 2017-05-15 MED FILL — traMADol HCL 50 MG TABS: 50 | 7 days supply | Qty: 30 | Fill #0

## 2017-05-15 NOTE — Discharge Summary (Signed)
Physician Discharge Summary  Patient ID: Jenny Bradford MRN: [id number] DOB/AGE: Nov 24, 1950 67 y.o.  Admit date: 05/13/2017 Discharge date: 05/15/2017  Admission Diagnoses:  Discharge Diagnoses:  Active Problems:   Paraesophageal hernia   Discharged Condition: good  Hospital Course: she was admitted for observation and supportive care following a laparoscopic repair of a very large paraesophageal hernia and Nissen fundoplication.  On postop day 1 she had significant issues with nausea but denied any dysphagia symptoms.  Her diet was slowly advanced from clears to full liquids.  Upper GI revealed a dilated esophagus with expected edema at the level of the wrap, appropriate intra-abdominal position of the wrap and stomach.  No leak. On postoperative day 2, the nausea had improved somewhat but she was now having pain with swallowing and noted that she was unable to progress to a soft diet.  Full liquids continued to go down easily.  She was ventilating in the halls.  She was cleared by physical therapy.  Her pain was well controlled with oral medications. She was able to pull about 1700 on incentive spirometer.Her vital signs have remained normal for the duration of her admission. She was deemed stable for discharge home  Consults: None  Significant Diagnostic Studies: upper GI  Treatments: laparoscopic repair of large paraesophageal hernia, Nissen fundoplication.  IV fluids and symptom control  Discharge Exam: Blood pressure 124/87, pulse 78, temperature 98.8 F (37.1 C), temperature source Oral, resp. rate 18, height 5\' 3"  (1.6 m), weight 74.8 kg (165 lb), SpO2 95 %. General appearance: alert Resp: clear to auscultation bilaterally Cardio: regular rate and rhythm GI: soft, appropriately tender around incisions, minimal ecchymosis developing around incisions.  Incisions otherwise clean dry and intact without infection Extremities: extremities normal, atraumatic, no cyanosis or  edema Pulses: 2+ and symmetric Skin: Skin color, texture, turgor normal. No rashes or lesions Neurologic: Grossly normal Incision/Wound: C/D/I  Disposition: 01-Home or Self Care   Allergies as of 05/15/2017      Reactions   Midol [aspirin-cinnamedrine-caffeine] Other (See Comments)   DIZZINESS   Talwin [pentazocine] Other (See Comments)   EXTREME DROWSINESS   Naproxen Rash      Medication List    STOP taking these medications   ondansetron 4 MG tablet Commonly known as:  ZOFRAN     TAKE these medications   acetaminophen 500 MG tablet Commonly known as:  TYLENOL Take 500 mg by mouth 2 (two) times daily.   ALPRAZolam 0.25 MG tablet Commonly known as:  XANAX Take 0.25 mg by mouth at bedtime as needed for sleep.   butalbital-acetaminophen-caffeine 50-325-40 MG tablet Commonly known as:  FIORICET, ESGIC Take 1 tablet by mouth 2 (two) times daily as needed for headache.   docusate sodium 100 MG capsule Commonly known as:  COLACE Take 100 mg by mouth daily as needed for mild constipation.   escitalopram 20 MG tablet Commonly known as:  LEXAPRO Take 10-20 mg by mouth every morning. Hot flashes   estradiol 0.1 MG/GM vaginal cream Commonly known as:  ESTRACE Place 2 g vaginally 2 (two) times a week. USE TWICE WEEKLY   hydrochlorothiazide 25 MG tablet Commonly known as:  HYDRODIURIL Take 12.5-25 mg by mouth daily as needed (fluid / blood pressure).   levothyroxine 50 MCG tablet Commonly known as:  SYNTHROID, LEVOTHROID Take 50 mcg by mouth daily before breakfast.   losartan 100 MG tablet Commonly known as:  COZAAR Take 1 tablet (100 mg total) by mouth every morning.   metoCLOPramide  10 MG tablet Commonly known as:  REGLAN Take 1 tablet (10 mg total) by mouth 3 (three) times daily with meals.   ondansetron 4 MG disintegrating tablet Commonly known as:  ZOFRAN-ODT Take 1 tablet (4 mg total) by mouth every 6 (six) hours as needed for nausea.   pantoprazole 40 MG  tablet Commonly known as:  PROTONIX Take 40 mg by mouth daily.   polyethylene glycol packet Commonly known as:  MIRALAX / GLYCOLAX Take 17 g by mouth daily as needed for mild constipation or moderate constipation.   pravastatin 40 MG tablet Commonly known as:  PRAVACHOL Take 40 mg by mouth at bedtime.   promethazine 25 MG tablet Commonly known as:  PHENERGAN Take 1 tablet (25 mg total) by mouth every 6 (six) hours as needed for nausea or vomiting.   simethicone 80 MG chewable tablet Commonly known as:  MYLICON Chew 1 tablet (80 mg total) by mouth every 6 (six) hours as needed for flatulence (bloating).   SUMAtriptan 50 MG tablet Commonly known as:  IMITREX Take 50 mg by mouth every 2 (two) hours as needed for migraine.   traMADol 50 MG tablet Commonly known as:  ULTRAM Take 1 tablet (50 mg total) by mouth every 6 (six) hours as needed for moderate pain or severe pain (no relieved by oxycodone). What changed:  reasons to take this   TUMS 500 MG chewable tablet Generic drug:  calcium carbonate Chew 1 tablet by mouth daily as needed for indigestion.   Vitamin D 2000 units tablet Take 2,000 Units by mouth daily.      Follow-up Information    Clovis Riley, MD Follow up in 3 week(s).   Specialty:  General Surgery Why:  Pt already has appt Contact information: [phone]           Signed: Clovis Riley 05/15/2017, 11:14 AM

## 2017-05-15 NOTE — Progress Notes (Signed)
Assessment unchanged. Pt verbalized understanding of dc instructions through teach back including follow up care and when to call the doctor.  Scripts x 5 given as provided by MD. Discharged via wc to front entrance accompanied by nurse and husband.

## 2017-05-20 ENCOUNTER — Encounter (HOSPITAL_COMMUNITY): Payer: Self-pay | Admitting: Surgery

## 2017-05-20 ENCOUNTER — Ambulatory Visit (INDEPENDENT_AMBULATORY_CARE_PROVIDER_SITE_OTHER): Payer: 59 | Admitting: Orthopaedic Surgery

## 2017-05-20 DIAGNOSIS — M1812 Unilateral primary osteoarthritis of first carpometacarpal joint, left hand: Secondary | ICD-10-CM

## 2017-05-20 NOTE — Progress Notes (Signed)
Patient is 62 days status post left thumb LR TI.  She is overall doing well.  She recently had a laparoscopic Nissen fundoplication for a large hiatal hernia.  She is recovering from this.  Her left thumb is doing much better.  She has had a recent lapse and hand therapy due to the gastric surgery.  Her surgical scar is fully healed.  Her numbness in her thumb is improving.  Her grip strength is improving.  Exam is improved.  At this point she can continue to progress with hand therapy per the protocol.  She may begin strengthening.  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  I will see her back in 6 weeks for recheck.

## 2017-06-05 ENCOUNTER — Ambulatory Visit (INDEPENDENT_AMBULATORY_CARE_PROVIDER_SITE_OTHER)
Admission: RE | Admit: 2017-06-05 | Discharge: 2017-06-05 | Disposition: A | Discharge status: Home / Self Care | Payer: 59 | Source: Ambulatory Visit | Attending: Internal Medicine | Admitting: Internal Medicine

## 2017-06-05 DIAGNOSIS — R918 Other nonspecific abnormal finding of lung field: Secondary | ICD-10-CM

## 2017-06-05 DIAGNOSIS — R911 Solitary pulmonary nodule: Secondary | ICD-10-CM

## 2017-06-05 MED FILL — CEPHALEXIN 500 MG CAPSULE: 500 | 4 days supply | Qty: 12 | Fill #0

## 2017-06-06 NOTE — Progress Notes (Signed)
Spoke with pt and notified of results per Dr. Wert. Pt verbalized understanding and denied any questions. 

## 2017-06-10 MED FILL — PANTOPRAZOLE SOD DR 40 MG T: 40 | 30 days supply | Qty: 30 | Fill #1

## 2017-06-10 MED FILL — LOSARTAN POTASSIUM 100 MG T: 100 | 30 days supply | Qty: 30 | Fill #1

## 2017-06-10 MED FILL — ESCITALOPRAM 20 MG TABLET: 20 | 30 days supply | Qty: 30 | Fill #2

## 2017-06-10 MED FILL — ESTRACE 0.01% CREAM: 0.1 | 30 days supply | Qty: 43 | Fill #4

## 2017-06-10 MED FILL — LEVOTHYROXINE 50 MCG TABLET: 50 | 30 days supply | Qty: 30 | Fill #5

## 2017-06-10 MED FILL — PRAVASTATIN NA 40 MG TAB: 40 | 30 days supply | Qty: 30 | Fill #1

## 2017-06-10 MED FILL — METOCLOPRAMIDE 10 MG TABLET: 10 | 30 days supply | Qty: 90 | Fill #1

## 2017-06-11 MED FILL — ALPRAZolam 0.25 MG TABS: 0.25 | 30 days supply | Qty: 30 | Fill #0

## 2017-06-24 ENCOUNTER — Encounter (INDEPENDENT_AMBULATORY_CARE_PROVIDER_SITE_OTHER): Payer: Self-pay

## 2017-06-24 ENCOUNTER — Ambulatory Visit (INDEPENDENT_AMBULATORY_CARE_PROVIDER_SITE_OTHER): Payer: 59 | Admitting: Orthopaedic Surgery

## 2017-06-24 ENCOUNTER — Encounter (INDEPENDENT_AMBULATORY_CARE_PROVIDER_SITE_OTHER): Payer: Self-pay | Admitting: Orthopaedic Surgery

## 2017-06-24 DIAGNOSIS — M1812 Unilateral primary osteoarthritis of first carpometacarpal joint, left hand: Secondary | ICD-10-CM

## 2017-06-24 NOTE — Progress Notes (Signed)
Patient comes in today for a work note to extend thru this Thursday when dr.xu will be back in the office

## 2017-06-26 ENCOUNTER — Ambulatory Visit (INDEPENDENT_AMBULATORY_CARE_PROVIDER_SITE_OTHER): Payer: 59 | Admitting: Orthopaedic Surgery

## 2017-06-26 ENCOUNTER — Encounter (INDEPENDENT_AMBULATORY_CARE_PROVIDER_SITE_OTHER): Payer: Self-pay | Admitting: Orthopaedic Surgery

## 2017-06-26 DIAGNOSIS — M79672 Pain in left foot: Secondary | ICD-10-CM

## 2017-06-26 DIAGNOSIS — M542 Cervicalgia: Secondary | ICD-10-CM

## 2017-06-26 DIAGNOSIS — M1812 Unilateral primary osteoarthritis of first carpometacarpal joint, left hand: Secondary | ICD-10-CM

## 2017-06-26 NOTE — Progress Notes (Signed)
Office Visit Note   Patient: Jenny Bradford           Date of Birth: 27-Sep-1950           MRN: [id number] Visit Date: 06/26/2017              Requested by: Thressa Sheller, [address], [place], [place] [address] PCP: Thressa Sheller, MD   Assessment & Plan: Visit Diagnoses:  1. Neck pain   2. Primary osteoarthritis of first carpometacarpal joint of left hand   3. Pain in left foot     Plan: Patient is a 67 year old female with multiple orthopedic related problems that have resulted in chronic pain.  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  She has trouble doing this because she has to stand and also she has chronic neck pain which makes it hard for her to work over-the-counter.  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  I reordered release from hand therapy.  Dry needling for her neck.  She has been from my standpoint I will see her back as needed. Total face to face encounter time was greater than 25 minutes and over half of this time was spent in counseling and/or coordination of care.  Follow-Up Instructions: Return if symptoms worsen or fail to improve.   Orders:  No orders of the defined types were placed in this encounter.  No orders of the defined types were placed in this encounter.     Procedures: No procedures performed   Clinical Data: No additional findings.   Subjective: Chief Complaint  Patient presents with  . Left Thumb - Pain, Follow-up    Patient comes in today for multiple complaints including neck pain, bilateral foot  pain, status post bilateral thumb CMC arthroplasty.  She is improving in terms of her most recent left thumb CMC arthroplasty.  She complains of significant neck pain with standing and at work.  She has had previous history of stress fractures in her feet.   Review of Systems   Objective: Vital Signs: There were no vitals taken for this visit.  Physical Exam  Ortho Exam Exam her neck and feet and hands are stable. Specialty Comments:  No specialty comments available.  Imaging: No results found.   PMFS History: Patient Active Problem List   Diagnosis Date Noted  . Paraesophageal hernia 05/13/2017  . Neck pain 01/03/2017  . Large hiatal hernia 12/28/2016  . Multiple pulmonary nodules determined by computed tomography of lung 12/27/2016  . Arthritis of carpometacarpal [facility]) joint of left thumb 05/28/2016  . OSA on CPAP 10/05/2012  . HTN (hypertension) 10/05/2012  . Hyperlipidemia 10/05/2012  . Migraine headache 10/05/2012   Past Medical History:  Diagnosis Date  . AAA (abdominal aortic aneurysm) ([place]) 08/2014   scanning every 2 years  . Arthritis    oa  . Complication of anesthesia    slow to awaken in past  . Family history of anesthesia complication    slow to awaken  . GERD (gastroesophageal reflux disease)   .  Grade II diastolic dysfunction 48/1856   Noted on ECHO  . H/O hiatal hernia   . Headache(784.0)    migraines (rare)  . Hemorrhoids   . Hyperlipidemia   . Hypertension 04/01/11   ECHO-EF>55% NUC STRESS TEST- 05/01/12  . Hypothyroidism   . Mild aortic valve regurgitation 04/2017   Noted on ECHO  . Mitral valve regurgitation 04/2017   Mild: Noted on ECHO  . Pulmonary nodules 2018   scanning every 6 months  . Sleep apnea 05/01/12 & 05/29/12   SLEEP STUDY-[place] [facility], NO CPAP USED SINCE MAY 2015 Uses oral device    Family History  Problem Relation Age of Onset  . Breast cancer Mother   . Ovarian cancer Mother   . Heart attack Father     . Heart disease Father   . Hypertension Brother   . Hyperlipidemia Maternal Grandmother   . Hypertension Maternal Grandmother     Past Surgical History:  Procedure Laterality Date  . ANKLE SURGERY Right 1980  . CARPOMETACARPEL SUSPENSION PLASTY Right 12/14/2014   Procedure: RIGHT THUMB CARPOMETACARPAL ([place]) ARTHROPLASTY;  Surgeon: Leandrew Koyanagi, MD;  Location: [facility];  Service: Orthopedics;  Laterality: Right;  . CARPOMETACARPEL SUSPENSION PLASTY Left 03/19/2017   Procedure: Left Thumb Ligament Reconstruction and Tendon Interposition;  Surgeon: Leandrew Koyanagi, MD;  Location: [facility];  Service: Orthopedics;  Laterality: Left;  . COLONOSCOPY WITH PROPOFOL N/A 12/07/2013   Procedure: COLONOSCOPY WITH PROPOFOL;  Surgeon: Garlan Fair, MD;  Location: [facility];  Service: Endoscopy;  Laterality: N/A;  . CYSTECTOMY  1975  . FOOT SURGERY Right 9.23.14  . FOOT SURGERY Left 10.01.13  . [place]  2005  . Miscarriage  42  . TONSILLECTOMY  1964   Social History   Occupational History  . Not on file  Tobacco Use  . Smoking status: Never Smoker  . Smokeless tobacco: Never Used  Substance and Sexual Activity  . Alcohol use: No    Alcohol/week: 0.0 oz  . Drug use: No  . Sexual activity: Not on file

## 2017-07-11 ENCOUNTER — Other Ambulatory Visit (INDEPENDENT_AMBULATORY_CARE_PROVIDER_SITE_OTHER): Payer: Self-pay | Admitting: Orthopaedic Surgery

## 2017-07-11 MED FILL — LOSARTAN POTASSIUM 100 MG T: 100 | 30 days supply | Qty: 30 | Fill #2

## 2017-07-11 MED FILL — HYDROCHLOROTHIAZIDE 25 MG T: 25 | 30 days supply | Qty: 30 | Fill #1

## 2017-07-11 MED FILL — PRAVASTATIN NA 40 MG TAB: 40 | 30 days supply | Qty: 30 | Fill #2

## 2017-07-11 MED FILL — ESCITALOPRAM 20 MG TABLET: 20 | 30 days supply | Qty: 30 | Fill #3

## 2017-07-11 MED FILL — LEVOTHYROXINE 50 MCG TABLET: 50 | 30 days supply | Qty: 30 | Fill #6

## 2017-07-11 MED FILL — PANTOPRAZOLE SOD DR 40 MG T: 40 | 30 days supply | Qty: 30 | Fill #2

## 2017-07-11 NOTE — Telephone Encounter (Signed)
Ok to rf? 

## 2017-07-11 NOTE — Telephone Encounter (Signed)
yes

## 2017-07-14 NOTE — Telephone Encounter (Signed)
Called to pharmacy 

## 2017-07-17 ENCOUNTER — Other Ambulatory Visit (INDEPENDENT_AMBULATORY_CARE_PROVIDER_SITE_OTHER): Payer: Self-pay

## 2017-07-17 ENCOUNTER — Other Ambulatory Visit (INDEPENDENT_AMBULATORY_CARE_PROVIDER_SITE_OTHER): Payer: Self-pay | Admitting: Orthopaedic Surgery

## 2017-07-17 MED ORDER — TRAMADOL HCL 50 MG PO TABS: ORAL_TABLET | ORAL | 2 refills | Status: DC

## 2017-07-17 MED FILL — ESTRACE 0.01% CREAM: 0.1 | 30 days supply | Qty: 43 | Fill #0

## 2017-07-17 NOTE — Telephone Encounter (Signed)
Called into pharmacy

## 2017-07-17 NOTE — Telephone Encounter (Signed)
yes

## 2017-07-18 MED FILL — traMADol HCL 50 MG TABS: 50 | 5 days supply | Qty: 30 | Fill #0

## 2017-07-24 MED FILL — ALPRAZolam 0.25 MG TABS: 0.25 | 30 days supply | Qty: 30 | Fill #0

## 2017-08-06 ENCOUNTER — Encounter (INDEPENDENT_AMBULATORY_CARE_PROVIDER_SITE_OTHER): Payer: Self-pay | Admitting: Orthopaedic Surgery

## 2017-08-06 ENCOUNTER — Ambulatory Visit (INDEPENDENT_AMBULATORY_CARE_PROVIDER_SITE_OTHER): Payer: 59 | Admitting: Orthopaedic Surgery

## 2017-08-06 DIAGNOSIS — M1812 Unilateral primary osteoarthritis of first carpometacarpal joint, left hand: Secondary | ICD-10-CM

## 2017-08-06 DIAGNOSIS — M25562 Pain in left knee: Secondary | ICD-10-CM | POA: Diagnosis not present

## 2017-08-06 DIAGNOSIS — M542 Cervicalgia: Secondary | ICD-10-CM | POA: Diagnosis not present

## 2017-08-06 DIAGNOSIS — G8929 Other chronic pain: Secondary | ICD-10-CM

## 2017-08-06 MED ORDER — DICLOFENAC SODIUM 1 % TD GEL: 2.0000 g | Freq: Four times a day (QID) | TRANSDERMAL | 5 refills | Status: DC

## 2017-08-06 MED ORDER — METHYLPREDNISOLONE 4 MG PO TBPK: ORAL_TABLET | ORAL | 0 refills | Status: DC

## 2017-08-06 MED FILL — VOLTAREN 1% GEL: 1 | 12 days supply | Qty: 100 | Fill #0

## 2017-08-06 MED FILL — METHYLPREDNISOLONE 4 MG TAB: 4 | 6 days supply | Qty: 21 | Fill #0

## 2017-08-06 NOTE — Progress Notes (Signed)
Office Visit Note   Patient: Jenny Bradford           Date of Birth: Aug 18, 1950           MRN: [id number] Visit Date: 08/06/2017              Requested by: Thressa Sheller, [address], [place], [place] [address] PCP: Thressa Sheller, MD   Assessment & Plan: Visit Diagnoses:  1. Neck pain   2. Primary osteoarthritis of first carpometacarpal joint of left hand   3. Chronic pain of left knee     Plan: Impression is cervical spondylosis, left knee pain, left thumb pain.  From a neck standpoint I do think it is fine to use cervical traction and physical therapy.  She understands that this will probably provide just temporary relief.  For the knee it sounds like this is gotten better on its own.  I do not appreciate any effusion or significant swelling.  I recommend continue symptomatic treatment.  For the left hand recommend using Voltaren gel to help with the swelling.  There is no appearance of infection or anything worrisome.  Questions encouraged and answered.  Follow-up as needed.  Prescription for Medrol Dosepak and Voltaren gel.  Follow-Up Instructions: Return if symptoms worsen or fail to improve.   Orders:  No orders of the defined types were placed in this encounter.  Meds ordered this encounter  Medications  . methylPREDNISolone (MEDROL DOSEPAK) 4 MG TBPK tablet    Sig: Use as directed    Dispense:  21 tablet    Refill:  0  . diclofenac sodium (VOLTAREN) 1 % GEL    Sig: Apply 2 g topically 4 (four) times daily.    Dispense:  1 Tube    Refill:  5      Procedures: No procedures performed   Clinical Data: No additional findings.   Subjective: Chief Complaint  Patient presents with  . Lower Back - Pain  . Left Hand - Pain  . Left Knee - Pain    Patient is following up today for back, left hand, left knee pain.  She has chronic pain.  She is uses Biofreeze and Tylenol ibuprofen and tramadol.  She has been doing physical therapy and  cervical traction which does help her neck pain temporarily.  She denies any constitutional symptoms.   Review of Systems  Constitutional: Negative.   HENT: Negative.   Eyes: Negative.   Respiratory: Negative.   Cardiovascular: Negative.   Endocrine: Negative.   Musculoskeletal: Negative.   Neurological: Negative.   Hematological: Negative.   Psychiatric/Behavioral: Negative.   All other systems reviewed and are negative.    Objective: Vital Signs: There were no vitals taken for this visit.  Physical Exam  Constitutional: She is oriented to person, place, and time. She appears well-developed and well-nourished.  Pulmonary/Chest: Effort normal.  Neurological: She is alert and oriented to person, place, and time.  Skin: Skin is warm. Capillary refill takes less than 2 seconds.  Psychiatric: She has a normal mood and affect. Her behavior is normal. Judgment and thought content normal.  Nursing note and vitals reviewed.   Ortho Exam Cervical spine exam is stable. Left knee exam shows no joint effusion.  Collaterals and cruciates are stable. Left hand exam shows a fully healed surgical scar.  Normal function of the hand.  He does have some mild tenderness and some mild swelling on the dors aspect of the thumb metacarpal.al  Specialty Comments:  No specialty comments available.  Imaging: No results found.   PMFS History: Patient Active Problem List   Diagnosis Date Noted  . Paraesophageal hernia 05/13/2017  . Neck pain 01/03/2017  . Large hiatal hernia 12/28/2016  . Multiple pulmonary nodules determined by computed tomography of lung 12/27/2016  . Arthritis of carpometacarpal [facility]) joint of left thumb 05/28/2016  . OSA on CPAP 10/05/2012  . HTN (hypertension) 10/05/2012  . Hyperlipidemia 10/05/2012  . Migraine headache 10/05/2012   Past Medical History:  Diagnosis Date  . AAA (abdominal aortic aneurysm) ([place]) 08/2014   scanning every 2 years  . Arthritis    oa  .  Complication of anesthesia    slow to awaken in past  . Family history of anesthesia complication    slow to awaken  . GERD (gastroesophageal reflux disease)   . Grade II diastolic dysfunction 76/5465   Noted on ECHO  . H/O hiatal hernia   . Headache(784.0)    migraines (rare)  . Hemorrhoids   . Hyperlipidemia   . Hypertension 04/01/11   ECHO-EF>55% NUC STRESS TEST- 05/01/12  . Hypothyroidism   . Mild aortic valve regurgitation 04/2017   Noted on ECHO  . Mitral valve regurgitation 04/2017   Mild: Noted on ECHO  . Pulmonary nodules 2018   scanning every 6 months  . Sleep apnea 05/01/12 & 05/29/12   SLEEP STUDY-[place] [facility] AND SLEEP, NO CPAP USED SINCE MAY 2015 Uses oral device    Family History  Problem Relation Age of Onset  . Breast cancer Mother   . Ovarian cancer Mother   . Heart attack Father   . Heart disease Father   . Hypertension Brother   . Hyperlipidemia Maternal Grandmother   . Hypertension Maternal Grandmother     Past Surgical History:  Procedure Laterality Date  . ANKLE SURGERY Right 1980  . CARPOMETACARPEL SUSPENSION PLASTY Right 12/14/2014   Procedure: RIGHT THUMB CARPOMETACARPAL ([place]) ARTHROPLASTY;  Surgeon: Leandrew Koyanagi, MD;  Location: [place];  Service: Orthopedics;  Laterality: Right;  . CARPOMETACARPEL SUSPENSION PLASTY Left 03/19/2017   Procedure: Left Thumb Ligament Reconstruction and Tendon Interposition;  Surgeon: Leandrew Koyanagi, MD;  Location: [place];  Service: Orthopedics;  Laterality: Left;  . COLONOSCOPY WITH PROPOFOL N/A 12/07/2013   Procedure: COLONOSCOPY WITH PROPOFOL;  Surgeon: Garlan Fair, MD;  [facility];  Service: Endoscopy;  Laterality: N/A;  . CYSTECTOMY  1975  . FOOT SURGERY Right 9.23.14  . FOOT SURGERY Left 10.01.13  . [place]  2005  . Miscarriage  10  . TONSILLECTOMY  1964   Social History   Occupational History  . Not on file  Tobacco Use  . Smoking status:  Never Smoker  . Smokeless tobacco: Never Used  Substance and Sexual Activity  . Alcohol use: No    Alcohol/week: 0.0 oz  . Drug use: No  . Sexual activity: Not on file

## 2017-08-11 MED FILL — PANTOPRAZOLE SOD DR 40 MG T: 40 | 30 days supply | Qty: 30 | Fill #0

## 2017-08-11 MED FILL — ESCITALOPRAM 20 MG TABLET: 20 | 30 days supply | Qty: 30 | Fill #4

## 2017-08-11 MED FILL — LEVOTHYROXINE 50 MCG TABLET: 50 | 30 days supply | Qty: 30 | Fill #7

## 2017-08-11 MED FILL — LOSARTAN POTASSIUM 100 MG T: 100 | 30 days supply | Qty: 30 | Fill #3

## 2017-08-11 MED FILL — HYDROCHLOROTHIAZIDE 25 MG T: 25 | 30 days supply | Qty: 30 | Fill #2

## 2017-08-11 MED FILL — PRAVASTATIN NA 40 MG TAB: 40 | 30 days supply | Qty: 30 | Fill #3

## 2017-08-22 MED FILL — ALPRAZolam 0.25 MG TABS: 0.25 | 30 days supply | Qty: 30 | Fill #1

## 2017-08-22 MED FILL — ESTRACE 0.01% CREAM: 0.1 | 30 days supply | Qty: 43 | Fill #1

## 2017-09-04 ENCOUNTER — Encounter: Payer: Self-pay | Admitting: Genetic Counselor

## 2017-09-04 ENCOUNTER — Encounter: Payer: Self-pay | Admitting: Cardiology

## 2017-09-04 ENCOUNTER — Ambulatory Visit (INDEPENDENT_AMBULATORY_CARE_PROVIDER_SITE_OTHER): Payer: 59 | Admitting: Cardiology

## 2017-09-04 ENCOUNTER — Telehealth: Payer: Self-pay | Admitting: Genetic Counselor

## 2017-09-04 VITALS — BP 146/84 | HR 67 | Ht 62.5 in | Wt 163.2 lb

## 2017-09-04 DIAGNOSIS — I10 Essential (primary) hypertension: Secondary | ICD-10-CM | POA: Diagnosis not present

## 2017-09-04 DIAGNOSIS — K449 Diaphragmatic hernia without obstruction or gangrene: Secondary | ICD-10-CM

## 2017-09-04 DIAGNOSIS — M542 Cervicalgia: Secondary | ICD-10-CM

## 2017-09-04 DIAGNOSIS — G4733 Obstructive sleep apnea (adult) (pediatric): Secondary | ICD-10-CM | POA: Diagnosis not present

## 2017-09-04 DIAGNOSIS — Z8249 Family history of ischemic heart disease and other diseases of the circulatory system: Secondary | ICD-10-CM | POA: Diagnosis not present

## 2017-09-04 DIAGNOSIS — R002 Palpitations: Secondary | ICD-10-CM | POA: Diagnosis not present

## 2017-09-04 DIAGNOSIS — E782 Mixed hyperlipidemia: Secondary | ICD-10-CM | POA: Diagnosis not present

## 2017-09-04 NOTE — Patient Instructions (Signed)
Medication Instructions:   NO CHANGE  Testing/Procedures:  Your physician has recommended that you wear a 14 DAY event monitor. Event monitors are medical devices that record the heart's electrical activity. Doctors most often [place] these monitors to diagnose arrhythmias. Arrhythmias are problems with the speed or rhythm of the heartbeat. The monitor is a small, portable device. You can wear one while you do your normal daily activities. This is usually used to diagnose what is causing palpitations/syncope (passing out).    Follow-Up:  Your physician recommends that you schedule a follow-up appointment in: [place]   If you need a refill on your cardiac medications before your next appointment, please call your pharmacy.

## 2017-09-04 NOTE — Progress Notes (Signed)
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 She had a negative nuclear stress stress in 2014 and normal LVF on echo Feb 2019 (with grade 2 DD). The pt had [employer] Fundoplication in march. She has kyphosis and chronic neck pain which led to her retirement recently. She is getting PT for her neck with symptomatic improvement.    She is seen today with complaints of palpitations. She just got back from vacation that she says was stressful. She also tells me that her current home situation is stressful with her being recently retired. She notices palpitations in the evening at rest. She says she has had some episodes sustained for a few minutes. She does not use caffeine.    Current Outpatient Medications  Medication Sig Dispense Refill  . acetaminophen (TYLENOL) 500 MG tablet Take 500 mg by mouth 2 (two) times daily.    Marland Kitchen ALPRAZolam (XANAX) 0.25 MG tablet Take 0.25 mg by mouth at bedtime as needed for sleep.     . butalbital-acetaminophen-caffeine (FIORICET, ESGIC) 50-325-40 MG per tablet Take 1 tablet by mouth 2 (two) times daily as needed for headache.    . calcium carbonate (TUMS) 500 MG chewable tablet Chew 1 tablet by mouth daily as needed for indigestion.     . Cholecalciferol (VITAMIN D) 2000 units tablet Take 2,000 Units by mouth daily.    . diclofenac sodium (VOLTAREN) 1 % GEL Apply 2 g topically 4 (four) times daily. 1 Tube 5  . docusate sodium (COLACE) 100 MG capsule Take 100 mg by mouth daily as needed for mild constipation.     Marland Kitchen escitalopram (LEXAPRO) 20 MG tablet Take 10-20 mg by mouth every morning. Hot flashes    . estradiol (ESTRACE) 0.1 MG/GM vaginal cream Place 2 g vaginally 2 (two) times a week. USE TWICE WEEKLY    . hydrochlorothiazide (HYDRODIURIL) 25 MG tablet Take 12.5-25 mg by  mouth daily as needed (fluid / blood pressure).     Marland Kitchen levothyroxine (SYNTHROID, LEVOTHROID) 50 MCG tablet Take 50 mcg by mouth daily before breakfast.    . losartan (COZAAR) 100 MG tablet Take 1 tablet (100 mg total) by mouth every morning. 90 tablet 3  . methylPREDNISolone (MEDROL DOSEPAK) 4 MG TBPK tablet Use as directed 21 tablet 0  . pantoprazole (PROTONIX) 40 MG tablet Take 40 mg by mouth daily.    . polyethylene glycol (MIRALAX / GLYCOLAX) packet Take 17 g by mouth daily as needed for mild constipation or moderate constipation.    . pravastatin (PRAVACHOL) 40 MG tablet Take 40 mg by mouth at bedtime.     . promethazine (PHENERGAN) 25 MG tablet Take 1 tablet (25 mg total) by mouth every 6 (six) hours as needed for nausea or vomiting. 30 tablet 0  . simethicone (MYLICON) 80 MG chewable tablet Chew 1 tablet (80 mg total) by mouth every 6 (six) hours as needed for flatulence (bloating). 30 tablet 0  . SUMAtriptan (IMITREX) 50 MG tablet Take 50 mg by mouth every 2 (two) hours as needed for migraine.     . traMADol (ULTRAM) 50 MG tablet TAKE 1 TO 2 TABLETS BY MOUTH 3 TIMES DAILY AS NEEDED 30 tablet 2   No current facility-administered medications for this visit.     Allergies  Allergen  Reactions  . Midol [Aspirin-Cinnamedrine-Caffeine] Other (See Comments)    DIZZINESS  . Talwin [Pentazocine] Other (See Comments)    EXTREME DROWSINESS  . Bisoprolol Other (See Comments)    weakness  . Naproxen Rash    Past Medical History:  Diagnosis Date  . AAA (abdominal aortic aneurysm) ([place]) 08/2014   scanning every 2 years  . Arthritis    oa  . Complication of anesthesia    slow to awaken in past  . Family history of anesthesia complication    slow to awaken  . GERD (gastroesophageal reflux disease)   . Grade II diastolic dysfunction 71/2458   Noted on ECHO  . H/O hiatal hernia   . Headache(784.0)    migraines (rare)  . Hemorrhoids   . Hyperlipidemia   . Hypertension 04/01/11    ECHO-EF>55% NUC STRESS TEST- 05/01/12  . Hypothyroidism   . Mild aortic valve regurgitation 04/2017   Noted on ECHO  . Mitral valve regurgitation 04/2017   Mild: Noted on ECHO  . Pulmonary nodules 2018   scanning every 6 months  . Sleep apnea 05/01/12 & 05/29/12   SLEEP STUDY-[place] [facility] AND SLEEP, NO CPAP USED SINCE MAY 2015 Uses oral device    Social History   Socioeconomic History  . Marital status: Married    Spouse name: Not on file  . Number of children: Not on file  . Years of education: Not on file  . Highest education level: Not on file  Occupational History  . Not on file  Social Needs  . Financial resource strain: Not on file  . Food insecurity:    Worry: Not on file    Inability: Not on file  . Transportation needs:    Medical: Not on file    Non-medical: Not on file  Tobacco Use  . Smoking status: Never Smoker  . Smokeless tobacco: Never Used  Substance and Sexual Activity  . Alcohol use: No    Alcohol/week: 0.0 oz  . Drug use: No  . Sexual activity: Not on file  Lifestyle  . Physical activity:    Days per week: Not on file    Minutes per session: Not on file  . Stress: Not on file  Relationships  . Social connections:    Talks on phone: Not on file    Gets together: Not on file    Attends religious service: Not on file    Active member of club or organization: Not on file    Attends meetings of clubs or organizations: Not on file    Relationship status: Not on file  . Intimate partner violence:    Fear of current or ex partner: Not on file    Emotionally abused: Not on file    Physically abused: Not on file    Forced sexual activity: Not on file  Other Topics Concern  . Not on file  Social History Narrative  . Not on file     Family History  Problem Relation Age of Onset  . Breast cancer Mother   . Ovarian cancer Mother   . Heart attack Father   . Heart disease Father   . Hypertension Brother   . Hyperlipidemia Maternal Grandmother    . Hypertension Maternal Grandmother      Review of Systems: General: negative for chills, fever, night sweats or weight changes.  Cardiovascular: negative for chest pain, dyspnea on exertion, edema, orthopnea, paroxysmal nocturnal dyspnea or shortness of breath Dermatological: negative for rash  Respiratory: negative for cough or wheezing Urologic: negative for hematuria Abdominal: negative for nausea, vomiting, diarrhea, bright red blood per rectum, melena, or hematemesis Neurologic: negative for visual changes, syncope, or dizziness All other systems reviewed and are otherwise negative except as noted above.    Blood pressure (!) 146/84, pulse 67, height 5' 2.5" (1.588 m), weight 163 lb 3.2 oz (74 kg), SpO2 97 %.  General appearance: alert, cooperative and no distress Neck: no carotid bruit and no JVD Lungs: clear to auscultation bilaterally Heart: regular rate and rhythm Extremities: no edema Skin: Skin color, texture, turgor normal. No rashes or lesions Neurologic: Grossly normal   ASSESSMENT AND PLAN:   Palpitations Pt seen in the office today with complaints of palpitations for the past few weeks.  OSA (obstructive sleep apnea) She was previously intolerant to C-pap. She saw Dr Ron Parker and has an oral device  Large hiatal hernia S/p Hiatal hernia repair, nissen fundoplication 0/9/29  HTN (hypertension) Stable- she has been on vacation and off her diuretic  Hyperlipidemia LDL 88 - on diet alone  Family history of coronary artery disease in father Father had CAD and PVD  Neck pain Chronic- getting physical therapy   PLAN  Check two week event monitor- she is sure we will catch some palpitations. She did not tolerate bisoprolol in the past-"weakness". She is reluctant to consider a Ca++ blocker secondary to constipation.  F/U with Dr Jenny Bradford in Aug- consider trial of Diltiazem if she has documented PAT.   Kerin Ransom PA-C 09/04/2017 10:25 AM

## 2017-09-04 NOTE — Assessment & Plan Note (Signed)
Chronic- getting physical therapy

## 2017-09-04 NOTE — Assessment & Plan Note (Signed)
She was previously intolerant to C-pap. She saw Dr Ron Parker and has an oral device

## 2017-09-04 NOTE — Assessment & Plan Note (Signed)
Stable- she has been on vacation and off her diuretic

## 2017-09-04 NOTE — Assessment & Plan Note (Signed)
Pt seen in the office today with complaints of palpitations for the past few weeks.

## 2017-09-04 NOTE — Telephone Encounter (Signed)
A genetic counseling appt has been scheduled for the pt to see Roma Kayser on 8/7 at 10am. Letter mailed to the pt.

## 2017-09-04 NOTE — Assessment & Plan Note (Signed)
S/p Hiatal hernia repair, nissen fundoplication 07/09/81

## 2017-09-04 NOTE — Assessment & Plan Note (Signed)
LDL 88 - on diet alone

## 2017-09-04 NOTE — Assessment & Plan Note (Signed)
Father had CAD and PVD

## 2017-09-15 MED FILL — LEVOTHYROXINE 50 MCG TABLET: 50 | 30 days supply | Qty: 30 | Fill #8

## 2017-09-15 MED FILL — traMADol HCL 50 MG TABS: 50 | 5 days supply | Qty: 30 | Fill #1

## 2017-09-15 MED FILL — ESCITALOPRAM 20 MG TABLET: 20 | 30 days supply | Qty: 30 | Fill #5

## 2017-09-15 MED FILL — HYDROCHLOROTHIAZIDE 25 MG T: 25 | 30 days supply | Qty: 30 | Fill #3

## 2017-09-15 MED FILL — LOSARTAN POTASSIUM 100 MG T: 100 | 30 days supply | Qty: 30 | Fill #4

## 2017-09-15 MED FILL — PRAVASTATIN NA 40 MG TAB: 40 | 30 days supply | Qty: 30 | Fill #4

## 2017-09-15 MED FILL — ESTRACE 0.01% CREAM: 0.1 | 30 days supply | Qty: 43 | Fill #2

## 2017-09-15 MED FILL — PANTOPRAZOLE SOD DR 40 MG T: 40 | 30 days supply | Qty: 30 | Fill #1

## 2017-09-16 ENCOUNTER — Ambulatory Visit (INDEPENDENT_AMBULATORY_CARE_PROVIDER_SITE_OTHER): Payer: 59

## 2017-09-16 DIAGNOSIS — R002 Palpitations: Secondary | ICD-10-CM | POA: Diagnosis not present

## 2017-09-17 MED FILL — ALPRAZolam 0.25 MG TABS: 0.25 | 30 days supply | Qty: 30 | Fill #2

## 2017-10-06 ENCOUNTER — Other Ambulatory Visit: Payer: Self-pay | Admitting: Obstetrics & Gynecology

## 2017-10-06 DIAGNOSIS — Z1231 Encounter for screening mammogram for malignant neoplasm of breast: Secondary | ICD-10-CM

## 2017-10-14 MED FILL — ESTRACE 0.01% CREAM: 0.1 | 30 days supply | Qty: 43 | Fill #3

## 2017-10-14 MED FILL — PRAVASTATIN NA 40 MG TAB: 40 | 30 days supply | Qty: 30 | Fill #5

## 2017-10-14 MED FILL — LOSARTAN POTASSIUM 100 MG T: 100 | 30 days supply | Qty: 30 | Fill #5

## 2017-10-14 MED FILL — HYDROCHLOROTHIAZIDE 25 MG T: 25 | 30 days supply | Qty: 30 | Fill #4

## 2017-10-14 MED FILL — ESCITALOPRAM 20 MG TABLET: 20 | 30 days supply | Qty: 30 | Fill #0

## 2017-10-14 MED FILL — ALPRAZolam 0.25 MG TABS: 0.25 | 30 days supply | Qty: 30 | Fill #0

## 2017-10-14 MED FILL — LEVOTHYROXINE 50 MCG TABLET: 50 | 30 days supply | Qty: 30 | Fill #9

## 2017-10-14 MED FILL — VOLTAREN 1% GEL: 1 | 30 days supply | Qty: 100 | Fill #1

## 2017-10-14 MED FILL — PANTOPRAZOLE SOD DR 40 MG T: 40 | 30 days supply | Qty: 30 | Fill #2

## 2017-10-15 ENCOUNTER — Inpatient Hospital Stay: Payer: 59

## 2017-10-15 ENCOUNTER — Inpatient Hospital Stay: Payer: 59 | Admitting: Genetic Counselor

## 2017-10-17 MED FILL — HYDROCORTISONE 2.5% CREAM: 2.5 | 20 days supply | Qty: 60 | Fill #0

## 2017-10-31 ENCOUNTER — Encounter (INDEPENDENT_AMBULATORY_CARE_PROVIDER_SITE_OTHER): Payer: Self-pay | Admitting: Orthopaedic Surgery

## 2017-10-31 ENCOUNTER — Ambulatory Visit (INDEPENDENT_AMBULATORY_CARE_PROVIDER_SITE_OTHER): Payer: 59 | Admitting: Orthopaedic Surgery

## 2017-10-31 ENCOUNTER — Ambulatory Visit (INDEPENDENT_AMBULATORY_CARE_PROVIDER_SITE_OTHER): Payer: Self-pay

## 2017-10-31 DIAGNOSIS — M1812 Unilateral primary osteoarthritis of first carpometacarpal joint, left hand: Secondary | ICD-10-CM

## 2017-10-31 NOTE — Progress Notes (Signed)
Office Visit Note   Patient: Jenny Bradford           Date of Birth: [birth year]           MRN: [id number] Visit Date: 10/31/2017              Requested by: Thressa Sheller, [address], [place], [place] [address] PCP: Thressa Sheller, MD   Assessment & Plan: Visit Diagnoses:  1. Arthritis of carpometacarpal ([facility]) joint of left thumb     Plan: Overall she is doing well from the right shoulder standpoint.  In terms of her left hand I think she is having an exacerbation of her underlying arthritis.  It appears that the thumb metacarpal is articulating with the trapezoid which may be causing her symptoms.  She declined a cortisone injection.  I recommend continue symptomatic treatment with immobilization and rest and NSAIDs as needed.  Questions encouraged and answered.  Follow-up as needed.  Follow-Up Instructions: Return if symptoms worsen or fail to improve.   Orders:  Orders Placed This Encounter  Procedures  . XR Hand Complete Left   No orders of the defined types were placed in this encounter.     Procedures: No procedures performed   Clinical Data: No additional findings.   Subjective: Chief Complaint  Patient presents with  . Left Hand - Pain    Jenny Bradford comes in today for 1 month history of worsening left thumb and hand pain and improving right trapezius pain.  Denies any injuries to the left hand.  She has had to start wearing her thumb brace again.  Denies any constitutional symptoms.   Review of Systems  Constitutional: Negative.   HENT: Negative.   Eyes: Negative.   Respiratory: Negative.   Cardiovascular: Negative.   Endocrine: Negative.   Musculoskeletal: Negative.   Neurological: Negative.   Hematological: Negative.   Psychiatric/Behavioral: Negative.   All other systems reviewed and are negative.    Objective: Vital Signs: There were no vitals taken for this visit.  Physical Exam  Constitutional: She is oriented  to person, place, and time. She appears well-developed and well-nourished.  Pulmonary/Chest: Effort normal.  Neurological: She is alert and oriented to person, place, and time.  Skin: Skin is warm. Capillary refill takes less than 2 seconds.  Psychiatric: She has a normal mood and affect. Her behavior is normal. Judgment and thought content normal.  Nursing note and vitals reviewed.   Ortho Exam Right shoulder exam shows mild discomfort with palpation in the trapezius muscle belly.  Otherwise exam is unremarkable.  Left hand exam shows a fully healed surgical scar.  No cellulitis or fluctuance or evidence of infection.  She has stable function of her left thumb.  Tenderness near the base of the thumb metacarpal and trapezoid.  Negative grind. Specialty Comments:  No specialty comments available.  Imaging: Xr Hand Complete Left  Result Date: 10/31/2017 Status post trapezia ectomy.  No fractures.  No structural abnormalities.  Irregularity on the radial side of the trapezoid.    PMFS History: Patient Active Problem List   Diagnosis Date Noted  . Palpitations 09/04/2017  . Family history of coronary artery disease in father 09/04/2017  . Paraesophageal hernia 05/13/2017  . Neck pain 01/03/2017  . Large hiatal hernia 12/28/2016  . Multiple pulmonary nodules determined by computed tomography of lung 12/27/2016  . Arthritis of carpometacarpal [facility]) joint of left thumb 05/28/2016  . OSA (obstructive sleep apnea) 10/05/2012  .  HTN (hypertension) 10/05/2012  . Hyperlipidemia 10/05/2012  . Migraine headache 10/05/2012   Past Medical History:  Diagnosis Date  . AAA (abdominal aortic aneurysm) ([place]) 08/2014   scanning every 2 years  . Arthritis    oa  . Complication of anesthesia    slow to awaken in past  . Family history of anesthesia complication    slow to awaken  . GERD (gastroesophageal reflux disease)   . Grade II diastolic dysfunction 90/2409   Noted on ECHO  . H/O  hiatal hernia   . Headache(784.0)    migraines (rare)  . Hemorrhoids   . Hyperlipidemia   . Hypertension 04/01/11   ECHO-EF>55% NUC STRESS TEST- 05/01/12  . Hypothyroidism   . Mild aortic valve regurgitation 04/2017   Noted on ECHO  . Mitral valve regurgitation 04/2017   Mild: Noted on ECHO  . Pulmonary nodules 2018   scanning every 6 months  . Sleep apnea 05/01/12 & 05/29/12   SLEEP STUDY-[place] [facility] AND SLEEP, NO CPAP USED SINCE MAY 2015 Uses oral device    Family History  Problem Relation Age of Onset  . Breast cancer Mother   . Ovarian cancer Mother   . Heart attack Father   . Heart disease Father   . Hypertension Brother   . Hyperlipidemia Maternal Grandmother   . Hypertension Maternal Grandmother     Past Surgical History:  Procedure Laterality Date  . ANKLE SURGERY Right 1980  . CARPOMETACARPEL SUSPENSION PLASTY Right 12/14/2014   Procedure: RIGHT THUMB CARPOMETACARPAL ([place]) ARTHROPLASTY;  Surgeon: Leandrew Koyanagi, MD;  Location: [place];  Service: Orthopedics;  Laterality: Right;  . CARPOMETACARPEL SUSPENSION PLASTY Left 03/19/2017   Procedure: Left Thumb Ligament Reconstruction and Tendon Interposition;  Surgeon: Leandrew Koyanagi, MD;  Location: [facility];  Service: Orthopedics;  Laterality: Left;  . COLONOSCOPY WITH PROPOFOL N/A 12/07/2013   Procedure: COLONOSCOPY WITH PROPOFOL;  Surgeon: Garlan Fair, MD;  [facility];  Service: Endoscopy;  Laterality: N/A;  . CYSTECTOMY  1975  . FOOT SURGERY Right 9.23.14  . FOOT SURGERY Left 10.01.13  . [place]  2005  . Miscarriage  4  . TONSILLECTOMY  1964   Social History   Occupational History  . Not on file  Tobacco Use  . Smoking status: Never Smoker  . Smokeless tobacco: Never Used  Substance and Sexual Activity  . Alcohol use: No    Alcohol/week: 0.0 standard drinks  . Drug use: No  . Sexual activity: Not on file

## 2017-11-12 MED FILL — PRAVASTATIN NA 40 MG TAB: 40 | 30 days supply | Qty: 30 | Fill #0

## 2017-11-12 MED FILL — PANTOPRAZOLE SOD DR 40 MG T: 40 | 30 days supply | Qty: 30 | Fill #3

## 2017-11-12 MED FILL — LOSARTAN POTASSIUM 100 MG T: 100 | 30 days supply | Qty: 30 | Fill #6

## 2017-11-12 MED FILL — traMADol HCL 50 MG TABS: 50 | 5 days supply | Qty: 30 | Fill #2

## 2017-11-12 MED FILL — LEVOTHYROXINE 50 MCG TABLET: 50 | 30 days supply | Qty: 30 | Fill #10

## 2017-11-12 MED FILL — HYDROCHLOROTHIAZIDE 25 MG T: 25 | 30 days supply | Qty: 30 | Fill #5

## 2017-11-12 MED FILL — ESTRACE 0.01% CREAM: 0.1 | 30 days supply | Qty: 43 | Fill #4

## 2017-11-12 MED FILL — ESCITALOPRAM 20 MG TABLET: 20 | 30 days supply | Qty: 30 | Fill #1

## 2017-11-12 MED FILL — ALPRAZolam 0.25 MG TABS: 0.25 | 30 days supply | Qty: 30 | Fill #1

## 2017-11-24 DIAGNOSIS — Z0279 Encounter for issue of other medical certificate: Secondary | ICD-10-CM

## 2017-12-03 ENCOUNTER — Ambulatory Visit: Payer: 59

## 2017-12-08 ENCOUNTER — Ambulatory Visit (INDEPENDENT_AMBULATORY_CARE_PROVIDER_SITE_OTHER): Payer: 59 | Admitting: Cardiovascular Disease

## 2017-12-08 ENCOUNTER — Encounter: Payer: Self-pay | Admitting: Cardiovascular Disease

## 2017-12-08 VITALS — BP 114/72 | HR 66 | Ht 62.5 in | Wt 162.0 lb

## 2017-12-08 DIAGNOSIS — Z8249 Family history of ischemic heart disease and other diseases of the circulatory system: Secondary | ICD-10-CM

## 2017-12-08 DIAGNOSIS — I519 Heart disease, unspecified: Secondary | ICD-10-CM

## 2017-12-08 DIAGNOSIS — I10 Essential (primary) hypertension: Secondary | ICD-10-CM

## 2017-12-08 DIAGNOSIS — E7800 Pure hypercholesterolemia, unspecified: Secondary | ICD-10-CM | POA: Diagnosis not present

## 2017-12-08 DIAGNOSIS — I5189 Other ill-defined heart diseases: Secondary | ICD-10-CM

## 2017-12-08 DIAGNOSIS — K449 Diaphragmatic hernia without obstruction or gangrene: Secondary | ICD-10-CM

## 2017-12-08 MED ORDER — OLMESARTAN MEDOXOMIL 40 MG PO TABS: 40.0000 mg | ORAL_TABLET | Freq: Every day | ORAL | 3 refills | Status: DC

## 2017-12-08 MED ORDER — ATORVASTATIN CALCIUM 40 MG PO TABS: 40.0000 mg | ORAL_TABLET | Freq: Every day | ORAL | 3 refills | Status: DC

## 2017-12-08 MED FILL — OLMESARTAN MEDOXOMIL 40 MG: 40 | 30 days supply | Qty: 30 | Fill #0

## 2017-12-08 MED FILL — ATORVASTATIN 40 MG TABLET: 40 | 30 days supply | Qty: 30 | Fill #0

## 2017-12-08 MED FILL — PANTOPRAZOLE SOD DR 40 MG T: 40 | 30 days supply | Qty: 30 | Fill #4

## 2017-12-08 MED FILL — LEVOTHYROXINE 50 MCG TABLET: 50 | 30 days supply | Qty: 30 | Fill #11

## 2017-12-08 MED FILL — ESCITALOPRAM 20 MG TABLET: 20 | 30 days supply | Qty: 30 | Fill #2

## 2017-12-08 NOTE — Patient Instructions (Signed)
Medication Instructions:  STOP Losartan STOP pravastatin  START olmesartan 40 mg daily START atorvastatin 40 mg daily  Labwork: At [facility]CP[facility], Lipid in 3 months  Follow-Up: Your physician wants you to follow-up in: 6 months with Dr. Claiborne Billings.  You will receive a reminder letter in the mail two months in advance. If you don't receive a letter, please call our office to schedule the follow-up appointment.   Any Other Special Instructions Will Be Listed Below (If Applicable).     If you need a refill on your cardiac medications before your next appointment, please call your pharmacy.

## 2017-12-08 NOTE — Progress Notes (Signed)
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  I had seen her in July 2014 in a sleep clinic following initiation of CPAP therapy for obstructive sleep apnea.  I had cared for her father Jenny Bradford for many years who ultimately died on 02-10-14 at [facility].  She established cardiology care with me in February 2019.  She presents for follow-up evaluation.  Jenny Bradford has a history of hypertension for many years.  Most recently, she has been on losartan 75 mg daily in addition to HCTZ 25 mg.  She also has a history of hypothyroidism and has been on levothyroxine at 50 g.  There is a history of hyperlipidemia and she has been on pravastatin 40 mg.  Her father had a history of significant abdominal aortic aneurysm.  She underwent abdominal aortic aneurysm screening and there was no significant aortic dilatation and instructed with a maximum aortic dimension at 2.3 cm.  She has a history of obstructive sleep apnea which was diagnosed in 2014 with an AHI of 14 per hour and RDI of 16.5 per hour.  She had significant oxygen desaturation to a nadir of 81% with rems sleep and had loud snoring.  Apparently should use CPAP for a short while but ultimately became intolerant was ultimately transitioned to an oral appliance for which she is followed by [name].   When I saw her in February 2019 she had begun to notice that despite taking medication for her blood pressure, her blood pressure elevated.  She was walking 4 days per week for 30 minutes at a time.  She denied any exertionally precipitation of chest pain.  She was unaware of any significant valvular abnormality, although her mother has aortic stenosis.  She also has a very large hiatal hernia and has seen Dr. [name]  and was told that in the future she may require surgery.  Since I saw her, she underwent surgery for paraesophageal hernia repair with a Nissen fundoplication in March 0737.  She also has had progressive difficulty with osteoarthritis involving her neck, back, hands, feet, and feet.  He has not been able to exercise as regularly.  She is no longer working since she is unable to stand up the entire day.  She now sees Jenny Bradford previous for primary care.  She has been caring for her husband who is permanently disabled since age 67.  She presents for evaluation.  Past Medical History:  Diagnosis Date  . AAA (abdominal aortic aneurysm) ([place]) 08/2014   scanning every 2 years  . Arthritis    oa  . Complication of anesthesia    slow to awaken in past  . Family history of anesthesia complication    slow to awaken  . GERD (gastroesophageal reflux disease)   . Grade II diastolic dysfunction 12/6267   Noted on ECHO  . H/O hiatal hernia   . Headache(784.0)    migraines (rare)  . Hemorrhoids   . Hyperlipidemia   . Hypertension 04/01/11   ECHO-EF>55% NUC STRESS TEST- 05/01/12  . Hypothyroidism   . Mild aortic valve regurgitation 04/2017   Noted on ECHO  . Mitral valve regurgitation 04/2017   Mild: Noted on ECHO  . Pulmonary  nodules 2018   scanning every 6 months  . Sleep apnea 05/01/12 & 05/29/12   SLEEP STUDY-[facility], NO CPAP USED SINCE MAY 2015 Uses oral device    Past Surgical History:  Procedure Laterality Date  . ANKLE SURGERY Right 1980  . CARPOMETACARPEL SUSPENSION PLASTY Right 12/14/2014   Procedure: RIGHT THUMB CARPOMETACARPAL ([place]) ARTHROPLASTY;  Surgeon: Leandrew Koyanagi, MD;  Location: [facility];  Service: Orthopedics;  Laterality: Right;  . CARPOMETACARPEL SUSPENSION PLASTY Left 03/19/2017   Procedure: Left Thumb Ligament Reconstruction and Tendon Interposition;  Surgeon: Leandrew Koyanagi, MD;  Location: [facility];  Service: Orthopedics;   Laterality: Left;  . COLONOSCOPY WITH PROPOFOL N/A 12/07/2013   Procedure: COLONOSCOPY WITH PROPOFOL;  Surgeon: Garlan Fair, MD;  [facility];  Service: Endoscopy;  Laterality: N/A;  . CYSTECTOMY  1975  . FOOT SURGERY Right 9.23.14  . FOOT SURGERY Left 10.01.13  . [place]  2005  . Miscarriage  62  . TONSILLECTOMY  1964    Current Medications: Outpatient Medications Prior to Visit  Medication Sig Dispense Refill  . acetaminophen (TYLENOL) 500 MG tablet Take 500 mg by mouth 2 (two) times daily.    Marland Kitchen ALPRAZolam (XANAX) 0.25 MG tablet Take 0.25 mg by mouth at bedtime as needed for sleep.     . butalbital-acetaminophen-caffeine (FIORICET, ESGIC) 50-325-40 MG per tablet Take 1 tablet by mouth 2 (two) times daily as needed for headache.    . calcium carbonate (TUMS) 500 MG chewable tablet Chew 1 tablet by mouth daily as needed for indigestion.     . Cholecalciferol (VITAMIN D) 2000 units tablet Take 2,000 Units by mouth daily.    . diclofenac sodium (VOLTAREN) 1 % GEL Apply 2 g topically 4 (four) times daily. 1 Tube 5  . docusate sodium (COLACE) 100 MG capsule Take 100 mg by mouth daily as needed for mild constipation.     Marland Kitchen escitalopram (LEXAPRO) 20 MG tablet Take 10-20 mg by mouth every morning. Hot flashes    . estradiol (ESTRACE) 0.1 MG/GM vaginal cream Place 2 g vaginally 2 (two) times a week. USE TWICE WEEKLY    . hydrochlorothiazide (HYDRODIURIL) 25 MG tablet Take 12.5-25 mg by mouth daily as needed (fluid / blood pressure).     Marland Kitchen levothyroxine (SYNTHROID, LEVOTHROID) 50 MCG tablet Take 50 mcg by mouth daily before breakfast.    . pantoprazole (PROTONIX) 40 MG tablet Take 40 mg by mouth daily.    . polyethylene glycol (MIRALAX / GLYCOLAX) packet Take 17 g by mouth daily as needed for mild constipation or moderate constipation.    . promethazine (PHENERGAN) 25 MG tablet Take 1 tablet (25 mg total) by mouth every 6 (six) hours as needed for nausea or vomiting. 30  tablet 0  . simethicone (MYLICON) 80 MG chewable tablet Chew 1 tablet (80 mg total) by mouth every 6 (six) hours as needed for flatulence (bloating). 30 tablet 0  . SUMAtriptan (IMITREX) 50 MG tablet Take 50 mg by mouth every 2 (two) hours as needed for migraine.     . traMADol (ULTRAM) 50 MG tablet TAKE 1 TO 2 TABLETS BY MOUTH 3 TIMES DAILY AS NEEDED 30 tablet 2  . losartan (COZAAR) 100 MG tablet Take 1 tablet (100 mg total) by mouth every morning. 90 tablet 3  . pravastatin (PRAVACHOL) 40 MG tablet Take 40 mg by mouth at bedtime.     . methylPREDNISolone (MEDROL DOSEPAK) 4 MG  TBPK tablet Use as directed (Patient not taking: Reported on 12/08/2017) 21 tablet 0   No facility-administered medications prior to visit.      Allergies:   Midol [aspirin-cinnamedrine-caffeine]; Talwin [pentazocine]; Bisoprolol; and Naproxen   Social History   Socioeconomic History  . Marital status: Married    Spouse name: Not on file  . Number of children: Not on file  . Years of education: Not on file  . Highest education level: Not on file  Occupational History  . Not on file  Social Needs  . Financial resource strain: Not on file  . Food insecurity:    Worry: Not on file    Inability: Not on file  . Transportation needs:    Medical: Not on file    Non-medical: Not on file  Tobacco Use  . Smoking status: Never Smoker  . Smokeless tobacco: Never Used  Substance and Sexual Activity  . Alcohol use: No    Alcohol/week: 0.0 standard drinks  . Drug use: No  . Sexual activity: Not on file  Lifestyle  . Physical activity:    Days per week: Not on file    Minutes per session: Not on file  . Stress: Not on file  Relationships  . Social connections:    Talks on phone: Not on file    Gets together: Not on file    Attends religious service: Not on file    Active member of club or organization: Not on file    Attends meetings of clubs or organizations: Not on file    Relationship status: Not on file    Other Topics Concern  . Not on file  Social History Narrative  . 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  No tobacco history.  Family History:  The patient's family history includes Breast cancer in her mother; Heart attack in her father; Heart disease in her father; Hyperlipidemia in her maternal grandmother; Hypertension in her brother and maternal grandmother; Ovarian cancer in her mother.   ROS General: Negative; No fevers, chills, or night sweats;  HEENT: Negative; No changes in vision or hearing, sinus congestion, difficulty swallowing Pulmonary: Negative; No cough, wheezing, shortness of breath, hemoptysis Cardiovascular: See history of present illness, no chest pain, PND, orthopnea. GI: Positive for large hiatal hernia; is post Nissan fundoplication surgery GU: Negative; No dysuria, hematuria, or difficulty voiding Musculoskeletal: Negative; no myalgias, joint pain, or weakness Hematologic/Oncology: Negative; no easy bruising, bleeding Endocrine: Negative; no heat/cold intolerance; no diabetes Neuro: Negative; no changes in balance, headaches Skin: Negative; No rashes or skin lesions Psychiatric: Negative; No behavioral problems, depression Sleep: Positive for OSA, now with a customized oral appliance with mandibular advancement; No snoring, daytime sleepiness, hypersomnolence, bruxism, restless legs, hypnogognic hallucinations, no cataplexy Other comprehensive 14 point system review is negative.   PHYSICAL EXAM:   VS:  BP 114/72   Pulse 66   Ht 5' 2.5" (1.588 m)   Wt 162 lb (73.5 kg)   BMI 29.16 kg/m     Repeat blood pressure by me 136/70  Wt Readings from Last 3 Encounters:  12/08/17 162 lb (73.5 kg)  09/04/17 163 lb 3.2 oz (74 kg)  05/13/17 165 lb (74.8 kg)    General: Alert, oriented, no distress.  Skin: normal turgor, no rashes, warm and dry HEENT: Normocephalic, atraumatic. Pupils equal round and reactive to  light; sclera anicteric; extraocular muscles intact;  Nose without  nasal septal hypertrophy Mouth/Parynx benign; Mallinpatti scale 3 Neck: No JVD, no carotid bruits; normal carotid upstroke Lungs: clear to ausculatation and percussion; no wheezing or rales Chest wall: without tenderness to palpitation Heart: PMI not displaced, RRR, s1 s2 normal, 1/6 systolic murmur, no diastolic murmur, no rubs, gallops, thrills, or heaves Abdomen: soft, nontender; no hepatosplenomehaly, BS+; abdominal aorta nontender and not dilated by palpation. Back: no CVA tenderness Pulses 2+ Musculoskeletal: full range of motion, normal strength, no joint deformities Extremities: no clubbing cyanosis or edema, Homan's sign negative  Neurologic: grossly nonfocal; Cranial nerves grossly wnl Psychologic: Normal mood and affect   Studies/Labs Reviewed:   EKG:  EKG is ordered today.  ECG (independently read by me): NSR at 66; normal intervals  04/21/2017 ECG (independently read by me): Normal sinus rhythm at 66 bpm.  No ST segment changes.  Normal intervals.  No ectopy.  Very mild RV conduction delay.  Recent Labs: BMP Latest Ref Rng & Units 05/14/2017 05/07/2017 03/14/2017  Glucose 65 - 99 mg/dL 129(H) 95 90  BUN 6 - 20 mg/dL 11 16 12   Creatinine 0.44 - 1.00 mg/dL 0.59 0.82 0.80  Sodium 135 - 145 mmol/L 132(L) 134(L) 135  Potassium 3.5 - 5.1 mmol/L 4.0 3.9 4.2  Chloride 101 - 111 mmol/L 98(L) 101 101  CO2 22 - 32 mmol/L 25 24 29   Calcium 8.9 - 10.3 mg/dL 8.4(L) 9.2 9.2     No flowsheet data found.  CBC Latest Ref Rng & Units 05/14/2017 05/07/2017  WBC 4.0 - 10.5 K/uL 16.4(H) 7.3  Hemoglobin 12.0 - 15.0 g/dL 11.5(L) 13.6  Hematocrit 36.0 - 46.0 % 34.7(L) 40.6  Platelets 150 - 400 K/uL 215 292   Lab Results  Component Value Date   MCV 96.7 05/14/2017   MCV 96.9 05/07/2017   No results found for: TSH No results found for: HGBA1C   BNP No results found for: BNP  ProBNP No results found for:  PROBNP   Lipid Panel  No results found for: CHOL, TRIG, HDL, CHOLHDL, VLDL, LDLCALC, LDLDIRECT   RADIOLOGY: No results found.   Additional studies/ records that were reviewed today include:  I reviewed the patient's prior evaluation at the [facility] in [facility].  following a sleep study.  I reviewed her imaging studies, and prior evaluation by Dr. Melvyn Novas.   ASSESSMENT:    1. Essential hypertension   2. Grade II diastolic dysfunction   3. Pure hypercholesterolemia   4. Family history of coronary artery disease in father   85. 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  There is continues to be issues with losartan.  She also has been on pravastatin for hyperlipidemia.  She tolerated extensive Nissen fundoplication surgery well from a cardiac standpoint.  She has now had progressive osteoarthritic issues limiting her exercise.  I have recommended that she switch from losartan to olmesartan 40 mg which should provide slightly improved blood pressure control and has not been involved in ARB contamination.  She will undergo repeat laboratory with [name] which is already scheduled for October 10.  Her ECG remained stable.  Her LDL cholesterol in April 2019 was 88.  I have recommended a target LDL less than 70 and  this reason have recommended discontinuance of pravastatin and will start her on atorvastatin 40 mg daily. Her father had a large aneurysm.  She is being monitored for aneurysm.  Reviewed her echo Doppler study again from every 2019 which showed normal EF at 09-47, grade 2 diastolic dysfunction with mild AR and mild MR.  I will see her in 6 months for reevaluation.  Medication Adjustments/Labs and Tests Ordered: Current  medicines are reviewed at length with the patient today.  Concerns regarding medicines are outlined above.  Medication changes, Labs and Tests ordered today are listed in the Patient Instructions below. Patient Instructions  Medication Instructions:  STOP Losartan STOP pravastatin  START olmesartan 40 mg daily START atorvastatin 40 mg daily  Labwork: At PCP-CMET, Lipid in 3 months  Follow-Up: Your physician wants you to follow-up in: 6 months with Dr. Claiborne Billings.  You will receive a reminder letter in the mail two months in advance. If you don't receive a letter, please call our office to schedule the follow-up appointment.   Any Other Special Instructions Will Be Listed Below (If Applicable).     If you need a refill on your cardiac medications before your next appointment, please call your pharmacy.      Signed, [name], MD  12/10/2017 6:30 PM    [facility] [address] [place], [place], [place]  [address] Phone: [phone]

## 2017-12-10 ENCOUNTER — Encounter: Payer: Self-pay | Admitting: Cardiovascular Disease

## 2017-12-10 MED FILL — ALPRAZolam 0.25 MG TABS: 0.25 | 30 days supply | Qty: 30 | Fill #2

## 2017-12-24 MED FILL — VOLTAREN 1% GEL: 1 | 13 days supply | Qty: 100 | Fill #2

## 2017-12-24 MED FILL — ESTRACE 0.01% CREAM: 0.1 | 30 days supply | Qty: 43 | Fill #0

## 2017-12-25 DIAGNOSIS — M79676 Pain in unspecified toe(s): Secondary | ICD-10-CM

## 2017-12-25 MED FILL — BUTALB-ACETAMIN-CAFF 50-300: 50-300-40 | 30 days supply | Qty: 60 | Fill #0

## 2017-12-30 MED FILL — CARISOPRODOL 350 MG TABS: 350 | 10 days supply | Qty: 40 | Fill #0

## 2018-01-07 ENCOUNTER — Other Ambulatory Visit (INDEPENDENT_AMBULATORY_CARE_PROVIDER_SITE_OTHER): Payer: Self-pay | Admitting: Orthopaedic Surgery

## 2018-01-07 MED FILL — LEVOTHYROXINE 50 MCG TABLET: 50 | 30 days supply | Qty: 30 | Fill #12

## 2018-01-07 MED FILL — HYDROCHLOROTHIAZIDE 25 MG T: 25 | 30 days supply | Qty: 30 | Fill #0

## 2018-01-07 MED FILL — ATORVASTATIN 40 MG TABLET: 40 | 30 days supply | Qty: 30 | Fill #1

## 2018-01-07 MED FILL — OLMESARTAN MEDOXOMIL 40 MG: 40 | 30 days supply | Qty: 30 | Fill #1

## 2018-01-07 MED FILL — HYDROCORTISONE 2.5% CREAM: 2.5 | 30 days supply | Qty: 60 | Fill #1

## 2018-01-07 MED FILL — ESCITALOPRAM 20 MG TABLET: 20 | 30 days supply | Qty: 30 | Fill #3

## 2018-01-07 MED FILL — VOLTAREN 1% GEL: 1 | 12 days supply | Qty: 100 | Fill #1

## 2018-01-08 NOTE — Telephone Encounter (Signed)
Ok to refill 

## 2018-01-09 MED FILL — traMADol HCL 50 MG TABS: 50 | 5 days supply | Qty: 30 | Fill #0

## 2018-01-09 MED FILL — CARISOPRODOL 350 MG TABS: 350 | 10 days supply | Qty: 40 | Fill #1

## 2018-01-09 MED FILL — ALPRAZolam 0.25 MG TABS: 0.25 | 30 days supply | Qty: 30 | Fill #0

## 2018-01-27 ENCOUNTER — Ambulatory Visit
Admission: RE | Admit: 2018-01-27 | Discharge: 2018-01-27 | Disposition: A | Discharge status: Home / Self Care | Payer: 59 | Source: Ambulatory Visit | Attending: Obstetrics & Gynecology | Admitting: Obstetrics & Gynecology

## 2018-01-27 DIAGNOSIS — Z1231 Encounter for screening mammogram for malignant neoplasm of breast: Secondary | ICD-10-CM

## 2018-02-03 ENCOUNTER — Other Ambulatory Visit: Payer: Self-pay | Admitting: Internal Medicine

## 2018-02-03 ENCOUNTER — Encounter (INDEPENDENT_AMBULATORY_CARE_PROVIDER_SITE_OTHER): Payer: Self-pay | Admitting: Orthopaedic Surgery

## 2018-02-03 ENCOUNTER — Ambulatory Visit (INDEPENDENT_AMBULATORY_CARE_PROVIDER_SITE_OTHER): Payer: 59 | Admitting: Orthopaedic Surgery

## 2018-02-03 VITALS — Ht 62.5 in | Wt 162.0 lb

## 2018-02-03 DIAGNOSIS — G8929 Other chronic pain: Secondary | ICD-10-CM

## 2018-02-03 DIAGNOSIS — M1812 Unilateral primary osteoarthritis of first carpometacarpal joint, left hand: Secondary | ICD-10-CM

## 2018-02-03 DIAGNOSIS — M545 Low back pain: Secondary | ICD-10-CM | POA: Diagnosis not present

## 2018-02-03 DIAGNOSIS — M542 Cervicalgia: Secondary | ICD-10-CM | POA: Diagnosis not present

## 2018-02-03 MED FILL — HYDROCHLOROTHIAZIDE 25 MG T: 25 | 30 days supply | Qty: 30 | Fill #1

## 2018-02-03 MED FILL — ESCITALOPRAM 20 MG TABLET: 20 | 30 days supply | Qty: 30 | Fill #4

## 2018-02-03 MED FILL — OLMESARTAN MEDOXOMIL 40 MG: 40 | 30 days supply | Qty: 30 | Fill #2

## 2018-02-03 MED FILL — traMADol HCL 50 MG TABS: 50 | 5 days supply | Qty: 30 | Fill #1

## 2018-02-03 MED FILL — ATORVASTATIN 40 MG TABLET: 40 | 30 days supply | Qty: 30 | Fill #2

## 2018-02-03 MED FILL — ESTRACE 0.01% CREAM: 0.1 | 30 days supply | Qty: 43 | Fill #1

## 2018-02-03 NOTE — Progress Notes (Signed)
Office Visit Note   Patient: Jenny Bradford           Date of Birth: 1950-08-10           MRN: [id number] Visit Date: 02/03/2018              Requested by: Thressa Sheller, [place], [place], [place] [address] PCP: Thressa Sheller, MD   Assessment & Plan: Visit Diagnoses:  1. Arthritis of carpometacarpal (CMC) joint of left thumb   2. Neck pain   3. Chronic left-sided low back pain without sciatica     Plan: I evaluated the x-rays from July of her left hand and it appears that her thumb metacarpal has subsided some in this may be articulating with the distal pole of the scaphoid and the trapezoid.  This could be the source of pain.  Overall she is wearing a thumb brace to help with hand function and after a long discussion regarding treatment options including revision surgery she we will try to treat this conservatively for now.  In terms of her back I think this is more of a muscular thing.  I did give her a prescription for dry needling and modalities as needed.  Follow-Up Instructions: Return if symptoms worsen or fail to improve.   Orders:  No orders of the defined types were placed in this encounter.  No orders of the defined types were placed in this encounter.     Procedures: No procedures performed   Clinical Data: No additional findings.   Subjective: Chief Complaint  Patient presents with  . Lower Back - Pain    New complaint; Lower left side back pain    [name] comes in today for lower back pain since October 1.  She states that there is severe pain.  There is no radiation.  Heat does help with pain relief.  She also takes Tylenol and ibuprofen and tramadol.  She has just started physical therapy.  She is also complaining of left thumb pain which she is 11 months status post left thumb CMC arthroplasty.  Her right thumb CMC arthroplasty has done really well and she has had no pain.  She denies any injuries to her left hand or her  back.   Review of Systems  Constitutional: Negative.   HENT: Negative.   Eyes: Negative.   Respiratory: Negative.   Cardiovascular: Negative.   Endocrine: Negative.   Musculoskeletal: Negative.   Neurological: Negative.   Hematological: Negative.   Psychiatric/Behavioral: Negative.   All other systems reviewed and are negative.    Objective: Vital Signs: Ht 5' 2.5" (1.588 m)   Wt 162 lb (73.5 kg)   BMI 29.16 kg/m   Physical Exam  Constitutional: She is oriented to person, place, and time. She appears well-developed and well-nourished.  Pulmonary/Chest: Effort normal.  Neurological: She is alert and oriented to person, place, and time.  Skin: Skin is warm. Capillary refill takes less than 2 seconds.  Psychiatric: She has a normal mood and affect. Her behavior is normal. Judgment and thought content normal.  Nursing note and vitals reviewed.   Ortho Exam Left thumb exam shows fully healed surgical scar.  She does have good range of motion of her thumb and opposition flexion palmar abduction.  She has decent grip strength.  Mildly positive grind. Low back exam shows tenderness along the lower lumbar paraspinal muscles off to the left side.  Negative Faber negative axial compression.  Negative straight  leg raise. Specialty Comments:  No specialty comments available.  Imaging: No results found.   PMFS History: Patient Active Problem List   Diagnosis Date Noted  . Palpitations 09/04/2017  . Family history of coronary artery disease in father 09/04/2017  . Paraesophageal hernia 05/13/2017  . Neck pain 01/03/2017  . Large hiatal hernia 12/28/2016  . Multiple pulmonary nodules determined by computed tomography of lung 12/27/2016  . Arthritis of carpometacarpal [facility]) joint of left thumb 05/28/2016  . OSA (obstructive sleep apnea) 10/05/2012  . HTN (hypertension) 10/05/2012  . Hyperlipidemia 10/05/2012  . Migraine headache 10/05/2012   Past Medical History:  Diagnosis  Date  . AAA (abdominal aortic aneurysm) ([place]) 08/2014   scanning every 2 years  . Arthritis    oa  . Complication of anesthesia    slow to awaken in past  . Family history of anesthesia complication    slow to awaken  . GERD (gastroesophageal reflux disease)   . Grade II diastolic dysfunction 73/4193   Noted on ECHO  . H/O hiatal hernia   . Headache(784.0)    migraines (rare)  . Hemorrhoids   . Hyperlipidemia   . Hypertension 04/01/11   ECHO-EF>55% NUC STRESS TEST- 05/01/12  . Hypothyroidism   . Mild aortic valve regurgitation 04/2017   Noted on ECHO  . Mitral valve regurgitation 04/2017   Mild: Noted on ECHO  . Pulmonary nodules 2018   scanning every 6 months  . Sleep apnea 05/01/12 & 05/29/12   SLEEP STUDY-[place] [facility], NO CPAP USED SINCE MAY 2015 Uses oral device    Family History  Problem Relation Age of Onset  . Breast cancer Mother   . Ovarian cancer Mother   . Heart attack Father   . Heart disease Father   . Hypertension Brother   . Hyperlipidemia Maternal Grandmother   . Hypertension Maternal Grandmother     Past Surgical History:  Procedure Laterality Date  . ANKLE SURGERY Right 1980  . CARPOMETACARPEL SUSPENSION PLASTY Right 12/14/2014   Procedure: RIGHT THUMB CARPOMETACARPAL ([place]) ARTHROPLASTY;  Surgeon: Leandrew Koyanagi, MD;  Location: [facility];  Service: Orthopedics;  Laterality: Right;  . CARPOMETACARPEL SUSPENSION PLASTY Left 03/19/2017   Procedure: Left Thumb Ligament Reconstruction and Tendon Interposition;  Surgeon: Leandrew Koyanagi, MD;  Location: [facility];  Service: Orthopedics;  Laterality: Left;  . COLONOSCOPY WITH PROPOFOL N/A 12/07/2013   Procedure: COLONOSCOPY WITH PROPOFOL;  Surgeon: Garlan Fair, MD;  [facility];  Service: Endoscopy;  Laterality: N/A;  . CYSTECTOMY  1975  . FOOT SURGERY Right 9.23.14  . FOOT SURGERY Left 10.01.13  . [place]  2005  . Miscarriage  68  .  TONSILLECTOMY  1964   Social History   Occupational History  . Not on file  Tobacco Use  . Smoking status: Never Smoker  . Smokeless tobacco: Never Used  Substance and Sexual Activity  . Alcohol use: No    Alcohol/week: 0.0 standard drinks  . Drug use: No  . Sexual activity: Not on file

## 2018-02-04 MED FILL — LEVOTHYROXINE 50 MCG TABLET: 50 | 30 days supply | Qty: 30 | Fill #0

## 2018-02-06 MED FILL — ALPRAZolam 0.25 MG TABS: 0.25 | 30 days supply | Qty: 30 | Fill #0

## 2018-02-23 ENCOUNTER — Other Ambulatory Visit: Payer: Self-pay | Admitting: *Deleted

## 2018-02-23 MED ORDER — VALSARTAN 160 MG PO TABS: 160.0000 mg | ORAL_TABLET | Freq: Every day | ORAL | 5 refills | Status: DC

## 2018-02-23 MED FILL — VALSARTAN 160 MG TABLET: 160 | 30 days supply | Qty: 30 | Fill #0

## 2018-03-09 DIAGNOSIS — I10 Essential (primary) hypertension: Secondary | ICD-10-CM | POA: Diagnosis not present

## 2018-03-09 DIAGNOSIS — J111 Influenza due to unidentified influenza virus with other respiratory manifestations: Secondary | ICD-10-CM | POA: Diagnosis not present

## 2018-03-10 DIAGNOSIS — J111 Influenza due to unidentified influenza virus with other respiratory manifestations: Secondary | ICD-10-CM | POA: Diagnosis not present

## 2018-03-20 MED FILL — ATORVASTATIN 40 MG TABLET: 40 | 30 days supply | Qty: 30 | Fill #3

## 2018-03-23 DIAGNOSIS — M859 Disorder of bone density and structure, unspecified: Secondary | ICD-10-CM | POA: Diagnosis not present

## 2018-03-23 DIAGNOSIS — M8589 Other specified disorders of bone density and structure, multiple sites: Secondary | ICD-10-CM | POA: Diagnosis not present

## 2018-03-24 MED FILL — VALSARTAN 160 MG TABLET: 160 | 30 days supply | Qty: 30 | Fill #1

## 2018-03-24 MED FILL — traMADol HCL 50 MG TABS: 50 | 5 days supply | Qty: 30 | Fill #2

## 2018-03-24 MED FILL — LEVOTHYROXINE 50 MCG TABLET: 50 | 30 days supply | Qty: 30 | Fill #1

## 2018-03-24 MED FILL — ALPRAZolam 0.25 MG TABS: 0.25 | 30 days supply | Qty: 30 | Fill #1

## 2018-03-25 DIAGNOSIS — M542 Cervicalgia: Secondary | ICD-10-CM | POA: Diagnosis not present

## 2018-03-25 DIAGNOSIS — M545 Low back pain: Secondary | ICD-10-CM | POA: Diagnosis not present

## 2018-03-26 DIAGNOSIS — Z23 Encounter for immunization: Secondary | ICD-10-CM | POA: Diagnosis not present

## 2018-03-26 DIAGNOSIS — E785 Hyperlipidemia, unspecified: Secondary | ICD-10-CM | POA: Diagnosis not present

## 2018-03-26 DIAGNOSIS — M8580 Other specified disorders of bone density and structure, unspecified site: Secondary | ICD-10-CM | POA: Diagnosis not present

## 2018-03-26 DIAGNOSIS — I10 Essential (primary) hypertension: Secondary | ICD-10-CM | POA: Diagnosis not present

## 2018-04-01 DIAGNOSIS — M542 Cervicalgia: Secondary | ICD-10-CM | POA: Diagnosis not present

## 2018-04-01 DIAGNOSIS — M545 Low back pain: Secondary | ICD-10-CM | POA: Diagnosis not present

## 2018-04-08 ENCOUNTER — Other Ambulatory Visit: Payer: Self-pay | Admitting: Internal Medicine

## 2018-04-08 DIAGNOSIS — R918 Other nonspecific abnormal finding of lung field: Secondary | ICD-10-CM

## 2018-04-08 DIAGNOSIS — R911 Solitary pulmonary nodule: Secondary | ICD-10-CM

## 2018-04-17 MED FILL — ATORVASTATIN 40 MG TABLET: 40 | 30 days supply | Qty: 30 | Fill #4

## 2018-04-22 MED FILL — LEVOTHYROXINE 50 MCG TABLET: 50 | 30 days supply | Qty: 30 | Fill #2

## 2018-04-22 MED FILL — ALPRAZolam 0.25 MG TABS: 0.25 | 30 days supply | Qty: 30 | Fill #2

## 2018-04-22 MED FILL — ESCITALOPRAM 20 MG TABLET: 20 | 30 days supply | Qty: 30 | Fill #5

## 2018-04-22 MED FILL — VALSARTAN 160 MG TABLET: 160 | 30 days supply | Qty: 30 | Fill #2

## 2018-04-22 MED FILL — HYDROCHLOROTHIAZIDE 25 MG T: 25 | 30 days supply | Qty: 30 | Fill #2

## 2018-05-12 DIAGNOSIS — E785 Hyperlipidemia, unspecified: Secondary | ICD-10-CM | POA: Diagnosis not present

## 2018-05-12 DIAGNOSIS — E7800 Pure hypercholesterolemia, unspecified: Secondary | ICD-10-CM | POA: Diagnosis not present

## 2018-05-19 DIAGNOSIS — Z0184 Encounter for antibody response examination: Secondary | ICD-10-CM | POA: Diagnosis not present

## 2018-05-20 MED FILL — LEVOTHYROXINE 50 MCG TABLET: 50 | 30 days supply | Qty: 30 | Fill #3

## 2018-05-20 MED FILL — HYDROCORTISONE 2.5% CREAM: 2.5 | 15 days supply | Qty: 60 | Fill #0

## 2018-05-20 MED FILL — VALSARTAN 160 MG TABLET: 160 | 30 days supply | Qty: 30 | Fill #3

## 2018-05-20 MED FILL — ESCITALOPRAM 20 MG TABLET: 20 | 30 days supply | Qty: 30 | Fill #6

## 2018-05-20 MED FILL — DICLOFENAC SODIUM 1 % GEL: 1 | 12 days supply | Qty: 100 | Fill #2

## 2018-05-21 MED FILL — ALPRAZolam 0.25 MG TABS: 0.25 | 30 days supply | Qty: 30 | Fill #0

## 2018-05-22 ENCOUNTER — Encounter: Payer: Self-pay | Admitting: Cardiovascular Disease

## 2018-05-22 ENCOUNTER — Ambulatory Visit (INDEPENDENT_AMBULATORY_CARE_PROVIDER_SITE_OTHER): Payer: Medicare Other | Admitting: Cardiovascular Disease

## 2018-05-22 ENCOUNTER — Other Ambulatory Visit: Payer: Self-pay

## 2018-05-22 VITALS — BP 130/68 | HR 69 | Ht 62.0 in | Wt 164.4 lb

## 2018-05-22 DIAGNOSIS — I519 Heart disease, unspecified: Secondary | ICD-10-CM | POA: Diagnosis not present

## 2018-05-22 DIAGNOSIS — I5189 Other ill-defined heart diseases: Secondary | ICD-10-CM

## 2018-05-22 DIAGNOSIS — G4733 Obstructive sleep apnea (adult) (pediatric): Secondary | ICD-10-CM

## 2018-05-22 DIAGNOSIS — R0602 Shortness of breath: Secondary | ICD-10-CM

## 2018-05-22 DIAGNOSIS — I10 Essential (primary) hypertension: Secondary | ICD-10-CM

## 2018-05-22 DIAGNOSIS — K449 Diaphragmatic hernia without obstruction or gangrene: Secondary | ICD-10-CM

## 2018-05-22 DIAGNOSIS — Z79899 Other long term (current) drug therapy: Secondary | ICD-10-CM

## 2018-05-22 DIAGNOSIS — Z8249 Family history of ischemic heart disease and other diseases of the circulatory system: Secondary | ICD-10-CM | POA: Diagnosis not present

## 2018-05-22 MED ORDER — SPIRONOLACTONE 25 MG PO TABS: 12.5000 mg | ORAL_TABLET | Freq: Every day | ORAL | 3 refills | Status: DC

## 2018-05-22 MED ORDER — VALSARTAN 160 MG PO TABS: 80.0000 mg | ORAL_TABLET | Freq: Two times a day (BID) | ORAL | 5 refills | Status: DC

## 2018-05-22 MED ORDER — ATORVASTATIN CALCIUM 40 MG PO TABS: 20.0000 mg | ORAL_TABLET | ORAL | 0 refills | Status: DC

## 2018-05-22 MED FILL — SPIRONOLACTONE 25 MG TABS: 25 | 45 days supply | Qty: 90 | Fill #0

## 2018-05-22 MED FILL — ATORVASTATIN 40 MG TABLET: 40 | 90 days supply | Qty: 23 | Fill #0

## 2018-05-22 NOTE — Patient Instructions (Addendum)
Medication Instructions:  Stop HCTZ Decrease Valsartan to 80 mg (0.5 tablet) twice daily. Start Spirolactone 12.5 daily, monitor blood pressure - then increase to twice daily. Decrease Lipitor to 20 mg (0.5 tablet) every other day.  If you need a refill on your cardiac medications before your next appointment, please call your pharmacy.   Lab work: CMET, BNP in 2 weeks. If you have labs (blood work) drawn today and your tests are completely normal, you will receive your results only by: Marland Kitchen MyChart Message (if you have MyChart) OR . A paper copy in the mail If you have any lab test that is abnormal or we need to change your treatment, we will call you to review the results.  Procedures: Echocardiogram - Your physician has requested that you have an echocardiogram. Echocardiography is a painless test that uses sound waves to create images of your heart. It provides your doctor with information about the size and shape of your heart and how well your heart's chambers and valves are working. This procedure takes approximately one hour. There are no restrictions for this procedure. This will be performed at our [facility] location - [address], [address].  Follow-Up: At [facility], you and your health needs are our priority.  As part of our continuing mission to provide you with exceptional heart care, we have created designated Provider Care Teams.  These Care Teams include your primary Cardiologist (physician) and Advanced Practice Providers (APPs -  Physician Assistants and Nurse Practitioners) who all work together to provide you with the care you need, when you need it. You will need a follow up appointment in 2 months.  Please call our office 2 months in advance to schedule this appointment.  You may see Dr.Kelly or one of the following Advanced Practice Providers on your designated Care Team: Almyra Deforest, [place] . Fabian Sharp, PA-C

## 2018-05-22 NOTE — Progress Notes (Signed)
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  I had cared for her father [name] for many years who ultimately died on 2014-02-14 at [facility].  She established cardiology care with me in February 2019.  I last saw her in September 2019.  She presents for 59-month follow-up cardiologic evaluation with complaints of increasing shortness of breath and fatigue.  Jenny Bradford has a history of hypertension for many years.  Most recently, she has been on losartan 75 mg daily in addition to HCTZ 25 mg.  She also has a history of hypothyroidism and has been on levothyroxine at 50 g.  There is a history of hyperlipidemia and she has been on pravastatin 40 mg.  Her father had a history of significant abdominal aortic aneurysm.  She underwent abdominal aortic aneurysm screening and there was no significant aortic dilatation and instructed with a maximum aortic dimension at 2.3 cm.  She has a history of obstructive sleep apnea which was diagnosed in 2014 with an AHI of 14 per hour and RDI of 16.5 per hour.  She had significant oxygen desaturation to a nadir of 81% with rems sleep and had loud snoring.  Apparently should use CPAP for a short while but ultimately became intolerant was ultimately transitioned to an oral appliance for which she is followed by [name].   When I saw her in February 2019 she had begun to notice that despite taking medication for her blood pressure, her blood pressure elevated.  She was walking 4 days per week for 30 minutes at a time.  She denied any exertionally precipitation of chest pain.  She was unaware of any significant valvular abnormality, although her mother has aortic stenosis.  She also has a very large hiatal hernia and has  seen Dr. [name] and was told that in the future she may require surgery.  Shehe underwent surgery for paraesophageal hernia repair with a Nissen fundoplication in March 1017.  She also has had progressive difficulty with osteoarthritis involving her neck, back, hands, feet, and feet.  He has not been able to exercise as regularly.  She is no longer working since she is unable to stand up the entire day.  She now sees [name] for primary care.  She has been caring for her husband who is permanently disabled since age 68.    When I last saw her on December 08, 2017 as result of the generic impurities I had recommended she change from losartan 100 mg daily to olmesartan 40 mg.  Her lipid studies were not optimal on pravastatin and I suggested discontinuance of this and in its place started atorvastatin 40 mg.  An echo Doppler study in February 2019 showed an EF of 60 to 65% with grade 2 diastolic dysfunction, mild AR and mild MR.  An event monitor had shown sinus rhythm with low average heart rate at 50 and high average heart rate at 70 bpm.  She had several atrial couplets and one atrial triplet.  There were no episodes of atrial fibrillation.  There was no ventricular ectopy or pauses.  She called the office and wished to be seen sooner than her scheduled appointment due to  progressive fatigue and particularly shortness of breath particularly while climbing steps.  She has self adjusted some of her medications and apparently is no longer on olmesartan but has been on valsartan 160 mg.  However she has felt better when she cuts this in half and has been taking 80 mg twice a day.  She denies any chest tightness.  She is still walking 1 to 1-1/2 miles a day.  She has not been using CPAP and only intermittently uses her customized oral appliance.  She presents for evaluation   Past Medical History:  Diagnosis Date   AAA (abdominal aortic aneurysm) ([place]) 08/2014   scanning every 2 years   Arthritis      oa   Complication of anesthesia    slow to awaken in past   Family history of anesthesia complication    slow to awaken   GERD (gastroesophageal reflux disease)    Grade II diastolic dysfunction 63/1497   Noted on ECHO   H/O hiatal hernia    Headache(784.0)    migraines (rare)   Hemorrhoids    Hyperlipidemia    Hypertension 04/01/11   ECHO-EF>55% NUC STRESS TEST- 05/01/12   Hypothyroidism    Mild aortic valve regurgitation 04/2017   Noted on ECHO   Mitral valve regurgitation 04/2017   Mild: Noted on ECHO   Pulmonary nodules 2018   scanning every 6 months   Sleep apnea 05/01/12 & 05/29/12   SLEEP STUDY-[facility], NO CPAP USED SINCE MAY 2015 Uses oral device    Past Surgical History:  Procedure Laterality Date   ANKLE SURGERY Right 1980   CARPOMETACARPEL SUSPENSION PLASTY Right 12/14/2014   Procedure: RIGHT THUMB CARPOMETACARPAL ([place]) ARTHROPLASTY;  Surgeon: Leandrew Koyanagi, MD;  Location: [place];  Service: Orthopedics;  Laterality: Right;   CARPOMETACARPEL SUSPENSION PLASTY Left 03/19/2017   Procedure: Left Thumb Ligament Reconstruction and Tendon Interposition;  Surgeon: Leandrew Koyanagi, MD;  Location: [place];  Service: Orthopedics;  Laterality: Left;   COLONOSCOPY WITH PROPOFOL N/A 12/07/2013   Procedure: COLONOSCOPY WITH PROPOFOL;  Surgeon: Garlan Fair, MD;  Location: [facility];  Service: Endoscopy;  Laterality: N/A;   CYSTECTOMY  1975   FOOT SURGERY Right 9.23.14   FOOT SURGERY Left 10.01.13   HEMORRHOID SURGERY  2005   Miscarriage  1983   TONSILLECTOMY  1964    Current Medications: Outpatient Medications Prior to Visit  Medication Sig Dispense Refill   acetaminophen (TYLENOL) 500 MG tablet Take 500 mg by mouth 2 (two) times daily.     ALPRAZolam (XANAX) 0.25 MG tablet Take 0.25 mg by mouth at bedtime as needed for sleep.      butalbital-acetaminophen-caffeine (FIORICET, ESGIC)  50-325-40 MG per tablet Take 1 tablet by mouth 2 (two) times daily as needed for headache.     calcium carbonate (TUMS) 500 MG chewable tablet Chew 1 tablet by mouth daily as needed for indigestion.      Cholecalciferol (VITAMIN D) 2000 units tablet Take 2,000 Units by mouth daily.     diclofenac sodium (VOLTAREN) 1 % GEL Apply 2 g topically 4 (four) times daily. 1 Tube 5   docusate sodium (COLACE) 100 MG capsule Take 100 mg by mouth daily as needed for mild constipation.      escitalopram (LEXAPRO) 20 MG tablet Take 10-20 mg by mouth every morning. Hot flashes     estradiol (ESTRACE) 0.1 MG/GM vaginal cream Place 2 g vaginally 2 (two) times  a week. USE TWICE WEEKLY     famotidine (PEPCID) 10 MG tablet Take 10 mg by mouth 2 (two) times daily.     levothyroxine (SYNTHROID, LEVOTHROID) 50 MCG tablet Take 50 mcg by mouth daily before breakfast.     polyethylene glycol (MIRALAX / GLYCOLAX) packet Take 17 g by mouth daily as needed for mild constipation or moderate constipation.     simethicone (MYLICON) 80 MG chewable tablet Chew 1 tablet (80 mg total) by mouth every 6 (six) hours as needed for flatulence (bloating). 30 tablet 0   SUMAtriptan (IMITREX) 50 MG tablet Take 50 mg by mouth every 2 (two) hours as needed for migraine.      traMADol (ULTRAM) 50 MG tablet TAKE 1 TO 2 TABLETS BY MOUTH THREE TIMES DAILY AS NEEDED FOR PAIN 30 tablet 2   atorvastatin (LIPITOR) 40 MG tablet Take 1 tablet (40 mg total) by mouth daily. 90 tablet 3   hydrochlorothiazide (HYDRODIURIL) 25 MG tablet Take 12.5-25 mg by mouth daily as needed (fluid / blood pressure).      pantoprazole (PROTONIX) 40 MG tablet Take 40 mg by mouth daily.     promethazine (PHENERGAN) 25 MG tablet Take 1 tablet (25 mg total) by mouth every 6 (six) hours as needed for nausea or vomiting. 30 tablet 0   valsartan (DIOVAN) 160 MG tablet Take 1 tablet (160 mg total) by mouth daily. 30 tablet 5   No facility-administered medications  prior to visit.      Allergies:   Midol [aspirin-cinnamedrine-caffeine]; Talwin [pentazocine]; Bisoprolol; and Naproxen   Social History   Socioeconomic History   Marital status: Married    Spouse name: Not on file   Number of children: Not on file   Years of education: Not on file   Highest education level: Not on file  Occupational History   Not on file  Social Needs   Financial resource strain: Not on file   Food insecurity:    Worry: Not on file    Inability: Not on file   Transportation needs:    Medical: Not on file    Non-medical: Not on file  Tobacco Use   Smoking status: Never Smoker   Smokeless tobacco: Never Used  Substance and Sexual Activity   Alcohol use: No    Alcohol/week: 0.0 standard drinks   Drug use: No   Sexual activity: Not on file  Lifestyle   Physical activity:    Days per week: Not on file    Minutes per session: Not on file   Stress: Not on file  Relationships   Social connections:    Talks on phone: Not on file    Gets together: Not on file    Attends religious service: Not on file    Active member of club or organization: Not on file    Attends meetings of clubs or organizations: Not on file    Relationship status: Not on file  Other Topics Concern   Not on file  Social History Narrative   Not on file    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  No tobacco history.  Family History:  The patient's family history includes Breast cancer in her mother; Heart attack in her father; Heart disease in her father; Hyperlipidemia in her maternal grandmother; Hypertension in her brother and maternal grandmother; Ovarian cancer in her mother.   ROS General: Negative; No fevers, chills, or night sweats; increasing fatigue  HEENT: Negative; No changes in vision or hearing, sinus congestion, difficulty swallowing Pulmonary: Negative; No cough, wheezing, shortness of breath,  hemoptysis Cardiovascular: See history of present illness, no chest pain, PND, orthopnea. GI: Positive for large hiatal hernia; is post Nissan fundoplication surgery GU: Negative; No dysuria, hematuria, or difficulty voiding Musculoskeletal: Negative; no myalgias, joint pain, or weakness Hematologic/Oncology: Negative; no easy bruising, bleeding Endocrine: Negative; no heat/cold intolerance; no diabetes Neuro: Negative; no changes in balance, headaches Skin: Negative; No rashes or skin lesions Psychiatric: Negative; No behavioral problems, depression Sleep: Positive for OSA, now with a customized oral appliance with mandibular advancement; No snoring, daytime sleepiness, hypersomnolence, bruxism, restless legs, hypnogognic hallucinations, no cataplexy Other comprehensive 14 point system review is negative.   PHYSICAL EXAM:   VS:  BP 130/68    Pulse 69    Ht 5\' 2"  (1.575 m)    Wt 164 lb 6.4 oz (74.6 kg)    BMI 30.07 kg/m     Repeat blood pressure by me was 140/80 supine 122/80 standing  Wt Readings from Last 3 Encounters:  05/22/18 164 lb 6.4 oz (74.6 kg)  02/03/18 162 lb (73.5 kg)  12/08/17 162 lb (73.5 kg)    General: Alert, oriented, no distress.  Skin: normal turgor, no rashes, warm and dry HEENT: Normocephalic, atraumatic. Pupils equal round and reactive to light; sclera anicteric; extraocular muscles intact;  Nose without nasal septal hypertrophy Mouth/Parynx benign; Mallinpatti scale 3 Neck: No JVD, no carotid bruits; normal carotid upstroke Lungs: clear to ausculatation and percussion; no wheezing or rales Chest wall: without tenderness to palpitation Heart: PMI not displaced, RRR, s1 s2 normal, 1/6 systolic murmur, no diastolic murmur, no rubs, gallops, thrills, or heaves Abdomen: soft, nontender; no hepatosplenomehaly, BS+; abdominal aorta nontender and not dilated by palpation. Back: no CVA tenderness Pulses 2+ Musculoskeletal: full range of motion, normal strength, no  joint deformities Extremities: no clubbing cyanosis or edema, Homan's sign negative  Neurologic: grossly nonfocal; Cranial nerves grossly wnl Psychologic: Normal mood and affect   Studies/Labs Reviewed:   EKG:  EKG is ordered today.  ECG (independently read by me): Normal sinus rhythm at 69 bpm.  Poor anterior R wave progression V1 through V3.  December 08, 2017 ECG (independently read by me): NSR at 66; normal intervals  04/21/2017 ECG (independently read by me): Normal sinus rhythm at 66 bpm.  No ST segment changes.  Normal intervals.  No ectopy.  Very mild RV conduction delay.  Recent Labs: BMP Latest Ref Rng & Units 05/14/2017 05/07/2017 03/14/2017  Glucose 65 - 99 mg/dL 129(H) 95 90  BUN 6 - 20 mg/dL 11 16 12   Creatinine 0.44 - 1.00 mg/dL 0.59 0.82 0.80  Sodium 135 - 145 mmol/L 132(L) 134(L) 135  Potassium 3.5 - 5.1 mmol/L 4.0 3.9 4.2  Chloride 101 - 111 mmol/L 98(L) 101 101  CO2 22 - 32 mmol/L 25 24 29   Calcium 8.9 - 10.3 mg/dL 8.4(L) 9.2 9.2     No flowsheet data found.  CBC Latest Ref Rng & Units 05/14/2017 05/07/2017  WBC 4.0 - 10.5 K/uL 16.4(H) 7.3  Hemoglobin 12.0 - 15.0 g/dL 11.5(L) 13.6  Hematocrit 36.0 - 46.0 % 34.7(L) 40.6  Platelets 150 - 400 K/uL 215 292   Lab Results  Component Value Date   MCV 96.7 05/14/2017   MCV 96.9 05/07/2017   No results found for: TSH No results found for: HGBA1C   BNP No results found for: BNP  ProBNP No results found  for: PROBNP   Lipid Panel  No results found for: CHOL, TRIG, HDL, CHOLHDL, VLDL, LDLCALC, LDLDIRECT   RADIOLOGY: No results found.   Additional studies/ records that were reviewed today include:  I reviewed the patient's prior evaluation at the [facility] in 2014.  following a sleep study.  I reviewed her imaging studies, and prior evaluation by Dr. Melvyn Novas.   ASSESSMENT:    1. Essential hypertension   2. Shortness of breath   3. Grade II diastolic dysfunction   4. OSA  (obstructive sleep apnea)   5. Medication management   6. Family history of abdominal aortic aneurysm   7. Family history of coronary artery disease in father   52. 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  As she was on losartan for hypertension and had been on pravastatin for hyperlipidemia. Most recently she has been taking valsartan 160 mg daily but most of the time takes is 80 mg twice a day and occasionally just once a day.  She also has a new to take HCTZ.  On exam today there was 10 mm drop on my evaluation from supine to standing.  She does not have any signs of volume overload and there is no edema.  I have suggested she discontinue hydrochlorothiazide.  I have recommended that she continue to take valsartan and since she seems to tolerate it best at 80 mg twice a day take it in this format rather than 160 mg daily.  In 1 week I am checking a comprehensive metabolic panel and will also check a brain natruretic peptide.  I am scheduling her for a follow-up echo Doppler study particularly with her shortness of breath to make certain there is not been any significant change from her prior evaluation.  In the past she was found to have grade 2 diastolic dysfunction.  I also have strongly recommended she consider another sleep study.  She is only very rarely been using her oral appliance.  I discussed with her significant technology improvement in masks and that she may very well be able to tolerate new mask if CPAP was again re-implemented.  On her initial sleep evaluation sleep apnea was moderate with an AHI of 14 and an RDI of 16.5.  She also had significant oxygen desaturation to 81% and loud snoring.  I recommended that she use her oral appliance on a daily basis readdress whether or not to get retested for  sleep at her next office visit since she was not ready to consider this option presently.  Monitor her blood pressure at home and I suggested she also check orthostatics.  I reviewed recent laboratory that she had done on May 12, 2010 by Dr. Lilyan Gilford.  Creatinine was 0.9 with a BUN of 16.  He recently had elevation of liver function studies and at that time was taking 2 g of Tylenol a day.  Her ALT was 115 with an AST of 60.  She reduced her Tylenol to 500 mg just 2 times per day and her atorvastatin down to 20 mg.  Subsequent blood test on May 19, 2018 was improved with an AST of 36 and an ALT of 77.  I will see her 2 months for follow-up evaluation.   Medication Adjustments/Labs and Tests Ordered: Current  medicines are reviewed at length with the patient today.  Concerns regarding medicines are outlined above.  Medication changes, Labs and Tests ordered today are listed in the Patient Instructions below. Patient Instructions  Medication Instructions:  Stop HCTZ Decrease Valsartan to 80 mg (0.5 tablet) twice daily. Start Spirolactone 12.5 daily, monitor blood pressure - then increase to twice daily. Decrease Lipitor to 20 mg (0.5 tablet) every other day.  If you need a refill on your cardiac medications before your next appointment, please call your pharmacy.   Lab work: CMET, BNP in 2 weeks. If you have labs (blood work) drawn today and your tests are completely normal, you will receive your results only by:  [place] (if you have MyChart) OR  A paper copy in the mail If you have any lab test that is abnormal or we need to change your treatment, we will call you to review the results.  Procedures: Echocardiogram - Your physician has requested that you have an echocardiogram. Echocardiography is a painless test that uses sound waves to create images of your heart. It provides your doctor with information about the size and shape of your heart and how well your hearts chambers  and valves are working. This procedure takes approximately one hour. There are no restrictions for this procedure. This will be performed at our [facility] location - [address].  Follow-Up: At [facility], you and your health needs are our priority.  As part of our continuing mission to provide you with exceptional heart care, we have created designated Provider Care Teams.  These Care Teams include your primary Cardiologist (physician) and Advanced Practice Providers (APPs -  Physician Assistants and Nurse Practitioners) who all work together to provide you with the care you need, when you need it. You will need a follow up appointment in 2 months.  Please call our office 2 months in advance to schedule this appointment.  You may see Dr.Christasia Angeletti or one of the following Advanced Practice Providers on your designated Care Team: Almyra Deforest, PA-C  Fabian Sharp, [place]        Signed, [name], MD  05/23/2018 3:50 PM    [facility] [address], [place], [place], [place]  [address] Phone: [phone]

## 2018-05-23 ENCOUNTER — Encounter: Payer: Self-pay | Admitting: Cardiovascular Disease

## 2018-05-26 MED FILL — CHLORHEXIDINE 0.12% RINSE: 0.12 | 16 days supply | Qty: 473 | Fill #0

## 2018-05-28 ENCOUNTER — Telehealth: Payer: Self-pay | Admitting: Cardiology

## 2018-05-28 NOTE — Telephone Encounter (Signed)
   Primary Cardiologist:  Dr. Claiborne Billings  Patient contacted.  History reviewed.  No symptoms to suggest any unstable cardiac conditions.  She has no progression of her symptoms since her visit with Dr. Claiborne Billings, but she has noted a wide fluctuation in her blood pressures. She could not get back in with Dr. Claiborne Billings until June, and I let her know that I would send him this message so that he was aware of the blood pressure variability.      Cardiac Questionnaire:    Since your last visit or hospitalization:    1. Have you been having new or worsening chest pain? No   2. Have you been having new or worsening shortness of breath? No (stable) 3. Have you been having new or worsening leg swelling, wt gain, or increase in abdominal girth (pants fitting more tightly)? No (stable)   4. Have you had any passing out spells? No    *A YES to any of these questions would result in the appointment being kept. *If all the answers to these questions are NO, we should indicate that given the current situation regarding the worldwide coronarvirus pandemic, at the recommendation of the CDC, we are looking to limit gatherings in our waiting area, and thus will reschedule their appointment beyond four weeks from today.      Based on discussion, with current pandemic situation, we will be postponing this appointment for echo.  If symptoms change, she has been instructed to contact our office. She appreciated this and will contact us with any questions.  Routing to [employer] for rescheduling and assigning priority (1 = 4-6 wks, 2 = 6-12 wks, 3 = >12 wks). Priority 2.  Buford Dresser, MD  05/28/2018 4:35 PM         .

## 2018-05-29 ENCOUNTER — Ambulatory Visit (HOSPITAL_COMMUNITY): Payer: Medicare Other

## 2018-05-29 NOTE — Telephone Encounter (Signed)
Discussed with Julie.  °

## 2018-05-29 NOTE — Telephone Encounter (Signed)
I called patient to verify what her BP levels were.   17th-  127/74 in the am. Before medications. 158/81 in the evening - she took her 2nd valsartan at bedtime.  18th-  142/74 in the am. Before medications.  This is when she began taking the 12.5 spironolactone BID.  19th- 112/68 in the am. Before medications.   She has only been taking the increased spironolactone BID and does feel that it needs more time to work. Patient is to come in to have blood work completed next week.   I advised patient to continue current medication, and if not any more control of BP by that lab visit to let us know.   Patient also advised I would notify Dr.Kelly, and call if he had any other recommendations.

## 2018-06-04 DIAGNOSIS — K449 Diaphragmatic hernia without obstruction or gangrene: Secondary | ICD-10-CM | POA: Diagnosis not present

## 2018-06-04 DIAGNOSIS — Z8249 Family history of ischemic heart disease and other diseases of the circulatory system: Secondary | ICD-10-CM | POA: Diagnosis not present

## 2018-06-04 DIAGNOSIS — R0602 Shortness of breath: Secondary | ICD-10-CM | POA: Diagnosis not present

## 2018-06-04 DIAGNOSIS — I10 Essential (primary) hypertension: Secondary | ICD-10-CM | POA: Diagnosis not present

## 2018-06-04 DIAGNOSIS — Z79899 Other long term (current) drug therapy: Secondary | ICD-10-CM | POA: Diagnosis not present

## 2018-06-04 DIAGNOSIS — I519 Heart disease, unspecified: Secondary | ICD-10-CM | POA: Diagnosis not present

## 2018-06-04 DIAGNOSIS — G4733 Obstructive sleep apnea (adult) (pediatric): Secondary | ICD-10-CM | POA: Diagnosis not present

## 2018-06-05 LAB — COMPREHENSIVE METABOLIC PANEL
ALT: 35 IU/L — ABNORMAL HIGH (ref 0–32)
AST: 26 IU/L (ref 0–40)
Albumin/Globulin Ratio: 1.6 (ref 1.2–2.2)
Albumin: 4.1 g/dL (ref 3.8–4.8)
Alkaline Phosphatase: 94 IU/L (ref 39–117)
BUN / CREAT RATIO: 18 (ref 12–28)
BUN: 16 mg/dL (ref 8–27)
Bilirubin Total: 0.5 mg/dL (ref 0.0–1.2)
CO2: 23 mmol/L (ref 20–29)
Calcium: 9.3 mg/dL (ref 8.7–10.3)
Chloride: 103 mmol/L (ref 96–106)
Creatinine, Ser: 0.89 mg/dL (ref 0.57–1.00)
GFR calc Af Amer: 79 mL/min/{1.73_m2} (ref >59)
GFR calc non Af Amer: 68 mL/min/{1.73_m2} (ref >59)
Globulin, Total: 2.5 g/dL (ref 1.5–4.5)
Glucose: 88 mg/dL (ref 65–99)
Potassium: 4.7 mmol/L (ref 3.5–5.2)
Sodium: 141 mmol/L (ref 134–144)
Total Protein: 6.6 g/dL (ref 6.0–8.5)

## 2018-06-05 LAB — BRAIN NATRIURETIC PEPTIDE: BNP: 12.6 pg/mL (ref 0.0–100.0)

## 2018-06-08 ENCOUNTER — Other Ambulatory Visit: Payer: 59

## 2018-06-13 MED FILL — LEVOTHYROXINE 50 MCG TABLET: 50 | 30 days supply | Qty: 30 | Fill #4

## 2018-06-13 MED FILL — ESCITALOPRAM 20 MG TABLET: 20 | 30 days supply | Qty: 30 | Fill #7

## 2018-06-13 MED FILL — SPIRONOLACTONE 25 MG TABS: 25 | 45 days supply | Qty: 90 | Fill #1

## 2018-06-13 MED FILL — VALSARTAN 80 MG TABLET: 80 | 30 days supply | Qty: 60 | Fill #0

## 2018-06-18 MED FILL — ALPRAZolam 0.25 MG TABS: 0.25 | 30 days supply | Qty: 30 | Fill #1

## 2018-06-24 ENCOUNTER — Ambulatory Visit: Payer: 59 | Admitting: Cardiovascular Disease

## 2018-07-15 MED FILL — DICLOFENAC SODIUM 1 % GEL: 1 | 12 days supply | Qty: 100 | Fill #3

## 2018-07-15 MED FILL — HYDROCORTISONE 2.5% CREAM: 2.5 | 15 days supply | Qty: 60 | Fill #1

## 2018-07-15 MED FILL — ALPRAZolam 0.25 MG TABS: 0.25 | 30 days supply | Qty: 30 | Fill #0

## 2018-07-15 MED FILL — SUMATRIPTAN SUCC 100 MG TAB: 100 | 23 days supply | Qty: 9 | Fill #0

## 2018-07-15 MED FILL — VALSARTAN 160 MG TABLET: 160 | 18 days supply | Qty: 18 | Fill #0

## 2018-07-15 MED FILL — ESCITALOPRAM 20 MG TABLET: 20 | 30 days supply | Qty: 30 | Fill #8

## 2018-07-15 MED FILL — LEVOTHYROXINE 50 MCG TABLET: 50 | 30 days supply | Qty: 30 | Fill #5

## 2018-07-16 MED FILL — BUTALB-ACETAMIN-CAFF 50-325: 50-325-40 | 10 days supply | Qty: 60 | Fill #0

## 2018-07-21 ENCOUNTER — Other Ambulatory Visit: Payer: Self-pay

## 2018-07-21 ENCOUNTER — Encounter: Payer: Self-pay | Admitting: Orthopaedic Surgery

## 2018-07-21 ENCOUNTER — Ambulatory Visit (INDEPENDENT_AMBULATORY_CARE_PROVIDER_SITE_OTHER): Payer: Medicare Other | Admitting: Orthopaedic Surgery

## 2018-07-21 ENCOUNTER — Ambulatory Visit (INDEPENDENT_AMBULATORY_CARE_PROVIDER_SITE_OTHER): Payer: Medicare Other

## 2018-07-21 DIAGNOSIS — M25561 Pain in right knee: Secondary | ICD-10-CM | POA: Diagnosis not present

## 2018-07-21 MED ORDER — TRAMADOL HCL 50 MG PO TABS: 50.0000 mg | ORAL_TABLET | Freq: Every day | ORAL | 0 refills | Status: DC | PRN

## 2018-07-21 MED ORDER — METHYLPREDNISOLONE ACETATE 40 MG/ML IJ SUSP
40.0000 mg | INTRAMUSCULAR | Status: AC | PRN
  Administered 2018-07-21: 40 mg via INTRA_ARTICULAR

## 2018-07-21 MED ORDER — LIDOCAINE HCL 1 % IJ SOLN
2.0000 mL | INTRAMUSCULAR | Status: AC | PRN
  Administered 2018-07-21: 11:00:00 2 mL

## 2018-07-21 MED ORDER — BUPIVACAINE HCL 0.5 % IJ SOLN
2.0000 mL | INTRAMUSCULAR | Status: AC | PRN
  Administered 2018-07-21: 11:00:00 2 mL via INTRA_ARTICULAR

## 2018-07-21 MED FILL — traMADol HCL 50 MG TABS: 50 | 7 days supply | Qty: 14 | Fill #0

## 2018-07-21 NOTE — Progress Notes (Addendum)
Office Visit Note   Patient: Jenny Bradford           Date of Birth: 09/28/1950           MRN: [id number] Visit Date: 07/21/2018              Requested by: [name], [address] [place], [place] [address] PCP: [name], FNP   Assessment & Plan: Visit Diagnoses:  1. Acute pain of right knee     Plan: Impression is right knee osteoarthritis exacerbation.  We discussed activity restrictions as well as ice and elevation.  We did perform a cortisone injection today.  I refilled her tramadol today.  Patient instructed to notify us if she does not notice any improvement in a couple weeks so that we can obtain an MRI to rule out stress fracture.  Follow-Up Instructions: Return if symptoms worsen or fail to improve.   Orders:  Orders Placed This Encounter  Procedures  . XR KNEE 3 VIEW RIGHT   Meds ordered this encounter  Medications  . traMADol (ULTRAM) 50 MG tablet    Sig: Take 1-2 tablets (50-100 mg total) by mouth daily as needed.    Dispense:  30 tablet    Refill:  0      Procedures: Large Joint Inj: R knee on 07/21/2018 11:12 AM Indications: pain Details: 22 G needle  Arthrogram: No  Medications: 40 mg methylPREDNISolone acetate 40 MG/ML; 2 mL lidocaine 1 %; 2 mL bupivacaine 0.5 % Consent was given by the patient. Patient was prepped and draped in the usual sterile fashion.       Clinical Data: No additional findings.   Subjective: Chief Complaint  Patient presents with  . Right Knee - Pain    Jenny Bradford comes in today for evaluation of acute right knee pain that started about a week ago.  She denies any injuries.  She denies any calf pain or swelling or ankle pain and swelling.  She states that she has been doing quite a bit of walking for exercise and this has helped her back and neck and shoulders tremendously but she is having posterior right knee pain that radiates up into the hamstring region.  Denies any back  pain.  She has been using Tiger balm as well as rest and ice and a knee brace although the knee brace has irritated her skin.   Review of Systems  Constitutional: Negative.   HENT: Negative.   Eyes: Negative.   Respiratory: Negative.   Cardiovascular: Negative.   Endocrine: Negative.   Musculoskeletal: Negative.   Neurological: Negative.   Hematological: Negative.   Psychiatric/Behavioral: Negative.   All other systems reviewed and are negative.    Objective: Vital Signs: There were no vitals taken for this visit.  Physical Exam Vitals signs and nursing note reviewed.  Constitutional:      Appearance: She is well-developed.  Pulmonary:     Effort: Pulmonary effort is normal.  Skin:    General: Skin is warm.     Capillary Refill: Capillary refill takes less than 2 seconds.  Neurological:     Mental Status: She is alert and oriented to person, place, and time.  Psychiatric:        Behavior: Behavior normal.        Thought Content: Thought content normal.        Judgment: Judgment normal.     Ortho Exam Right knee exam shows no joint  effusion.  There are no popliteal masses that I can palpate.  Calf is nontender.  Negative Homans sign. Specialty Comments:  No specialty comments available.  Imaging: Xr Knee 3 View Right  Result Date: 07/21/2018 Moderate osteoarthritis with periarticular spurring.    PMFS History: Patient Active Problem List   Diagnosis Date Noted  . Palpitations 09/04/2017  . Family history of coronary artery disease in father 09/04/2017  . Paraesophageal hernia 05/13/2017  . Neck pain 01/03/2017  . Large hiatal hernia 12/28/2016  . Multiple pulmonary nodules determined by computed tomography of lung 12/27/2016  . Arthritis of carpometacarpal [facility]) joint of left thumb 05/28/2016  . OSA (obstructive sleep apnea) 10/05/2012  . HTN (hypertension) 10/05/2012  . Hyperlipidemia 10/05/2012  . Migraine headache 10/05/2012   Past Medical History:   Diagnosis Date  . AAA (abdominal aortic aneurysm) ([place]) 08/2014   scanning every 2 years  . Arthritis    oa  . Complication of anesthesia    slow to awaken in past  . Family history of anesthesia complication    slow to awaken  . GERD (gastroesophageal reflux disease)   . Grade II diastolic dysfunction 37/6283   Noted on ECHO  . H/O hiatal hernia   . Headache(784.0)    migraines (rare)  . Hemorrhoids   . Hyperlipidemia   . Hypertension 04/01/11   ECHO-EF>55% NUC STRESS TEST- 05/01/12  . Hypothyroidism   . Mild aortic valve regurgitation 04/2017   Noted on ECHO  . Mitral valve regurgitation 04/2017   Mild: Noted on ECHO  . Pulmonary nodules 2018   scanning every 6 months  . Sleep apnea 05/01/12 & 05/29/12   SLEEP STUDY-[place] [facility] SLEEP, NO CPAP USED SINCE MAY 2015 Uses oral device    Family History  Problem Relation Age of Onset  . Breast cancer Mother   . Ovarian cancer Mother   . Heart attack Father   . Heart disease Father   . Hypertension Brother   . Hyperlipidemia Maternal Grandmother   . Hypertension Maternal Grandmother     Past Surgical History:  Procedure Laterality Date  . ANKLE SURGERY Right 1980  . CARPOMETACARPEL SUSPENSION PLASTY Right 12/14/2014   Procedure: RIGHT THUMB CARPOMETACARPAL ([place]) ARTHROPLASTY;  Surgeon: Leandrew Koyanagi, MD;  Location: [place];  Service: Orthopedics;  Laterality: Right;  . CARPOMETACARPEL SUSPENSION PLASTY Left 03/19/2017   Procedure: Left Thumb Ligament Reconstruction and Tendon Interposition;  Surgeon: Leandrew Koyanagi, MD;  Location: [place];  Service: Orthopedics;  Laterality: Left;  . COLONOSCOPY WITH PROPOFOL N/A 12/07/2013   Procedure: COLONOSCOPY WITH PROPOFOL;  Surgeon: Garlan Fair, MD;  [facility];  Service: Endoscopy;  Laterality: N/A;  . CYSTECTOMY  1975  . FOOT SURGERY Right 9.23.14  . FOOT SURGERY Left 10.01.13  . [place]  2005  . Miscarriage  63   . TONSILLECTOMY  1964   Social History   Occupational History  . Not on file  Tobacco Use  . Smoking status: Never Smoker  . Smokeless tobacco: Never Used  Substance and Sexual Activity  . Alcohol use: No    Alcohol/week: 0.0 standard drinks  . Drug use: No  . Sexual activity: Not on file

## 2018-08-07 ENCOUNTER — Encounter

## 2018-08-11 NOTE — Telephone Encounter (Signed)
Routed to CVRR to advise on ARB med change

## 2018-08-12 ENCOUNTER — Telehealth: Payer: Self-pay | Admitting: Cardiovascular Disease

## 2018-08-12 ENCOUNTER — Telehealth (HOSPITAL_COMMUNITY): Payer: Self-pay | Admitting: *Deleted

## 2018-08-12 MED ORDER — CANDESARTAN CILEXETIL 8 MG PO TABS: 8.0000 mg | ORAL_TABLET | Freq: Two times a day (BID) | ORAL | 3 refills | Status: DC

## 2018-08-12 MED FILL — CANDESARTAN CILEXETIL 8 MG: 8 | 90 days supply | Qty: 180 | Fill #0

## 2018-08-12 NOTE — Telephone Encounter (Signed)

## 2018-08-12 NOTE — Telephone Encounter (Signed)
08/12/2018 Received Attending Physician form from [facility] that  [place] for Dr. Claiborne Billings to Sign off on.  I place in his mail box for him sign.  cbr

## 2018-08-13 ENCOUNTER — Ambulatory Visit (HOSPITAL_COMMUNITY): Payer: Medicare Other | Attending: Cardiology

## 2018-08-13 ENCOUNTER — Other Ambulatory Visit: Payer: Self-pay

## 2018-08-13 DIAGNOSIS — I5189 Other ill-defined heart diseases: Secondary | ICD-10-CM

## 2018-08-13 DIAGNOSIS — I10 Essential (primary) hypertension: Secondary | ICD-10-CM

## 2018-08-13 DIAGNOSIS — I519 Heart disease, unspecified: Secondary | ICD-10-CM | POA: Insufficient documentation

## 2018-08-13 DIAGNOSIS — Z79899 Other long term (current) drug therapy: Secondary | ICD-10-CM

## 2018-08-13 MED FILL — ALPRAZolam 0.25 MG TABS: 0.25 | 30 days supply | Qty: 30 | Fill #1

## 2018-08-13 MED FILL — ESCITALOPRAM 20 MG TABLET: 20 | 30 days supply | Qty: 30 | Fill #9

## 2018-08-13 MED FILL — LEVOTHYROXINE 50 MCG TABLET: 50 | 90 days supply | Qty: 90 | Fill #0

## 2018-08-17 ENCOUNTER — Telehealth: Payer: Self-pay | Admitting: Cardiovascular Disease

## 2018-08-17 NOTE — Telephone Encounter (Signed)
Video visit/call [phone]/IT chart/pre reg complete/consent obtained -- ttf

## 2018-08-24 NOTE — Progress Notes (Signed)
Virtual Visit via Video Note   This visit type was conducted due to national recommendations for restrictions regarding the COVID-19 Pandemic (e.g. social distancing) in an effort to limit this patient's exposure and mitigate transmission in our community.  Due to her co-morbid illnesses, this patient is at least at moderate risk for complications without adequate follow up.  This format is felt to be most appropriate for this patient at this time.  All issues noted in this document were discussed and addressed.  A limited physical exam was performed with this format.  Please refer to the patient's chart for her consent to telehealth for [facility].   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  I had cared for her father Jenny Bradford for many years who ultimately died on 02-09-14 at [facility].  She established cardiology care with me in February 2019.  I last saw her in March 2020.   Ms. Jenny Bradford has a history of hypertension for many years.  Most recently, she has been on losartan 75 mg daily in addition to HCTZ 25 mg.  She also has a history of hypothyroidism and has been on levothyroxine at 50 g.  There is a history of hyperlipidemia and she has been on pravastatin 40 mg.  Her father had a history of significant abdominal aortic aneurysm.  She underwent abdominal aortic aneurysm screening and there was no significant aortic dilatation and instructed with a maximum aortic dimension at 2.3 cm.  She has a history of obstructive sleep apnea which was diagnosed in 2014 with an AHI of 14 per hour and RDI of 16.5 per hour.  She had  significant oxygen desaturation to a nadir of 81% with rems sleep and had loud snoring.  Apparently should use CPAP for a short while but ultimately became intolerant was ultimately transitioned to an oral appliance for which she is followed by Dr. Oneal Bradford.   When I saw her in February 2019 she had begun to notice that despite taking medication for her blood pressure, her blood pressure elevated.  She was walking 4 days per week for 30 minutes at a time.  She denied any exertionally precipitation of chest pain.  She was unaware of any significant valvular abnormality, although her mother has aortic stenosis.  She also has a very large hiatal hernia and has seen Dr. Paulita Bradford and was told that in the future she may require surgery.  She underwent surgery for paraesophageal hernia repair with a Nissen fundoplication in March 9450.  She also has had progressive difficulty with osteoarthritis involving her neck, back, hands, feet, and feet.  He has not been able to exercise as regularly.  She is no longer working since she is unable to stand up the entire day.  She now sees Jenny Bradford for primary care.  She has been caring for her husband who is permanently disabled since age 65.    When I  saw her on December 08, 2017 as result of the generic impurities I had recommended she change from losartan 100 mg daily to olmesartan 40 mg.  Her lipid  studies were not optimal on pravastatin and I suggested discontinuance of this and in its place started atorvastatin 40 mg.  An echo Doppler study in February 2019 showed an EF of 60 to 65% with grade 2 diastolic dysfunction, mild AR and mild MR.  An event monitor had shown sinus rhythm with low average heart rate at 50 and high average heart rate at 70 bpm.  She had several atrial couplets and one atrial triplet.  There were no episodes of atrial fibrillation.  There was no ventricular ectopy or pauses.  She called the office and wished to be seen sooner than her  scheduled appointment due to progressive fatigue and particularly shortness of breath particularly while climbing steps. I saw her on May 22, 2018 for evaluation.  She had self adjusted some of her medications and was no longer on olmesartan but has been on valsartan 160 mg but most of the time was taking 80 mg twice a day and occasionally just once a day.  She also has a new to take HCTZ.  On exam  there was 10 mm drop on my evaluation from supine to standing.  She did not have any signs of volume overload and there was no edema.  I suggested she discontinue hydrochlorothiazide.  I have recommended that she continue to take valsartan and since she seems to tolerate it best at 80 mg twice a day take it in this format rather than 160 mg daily.   In the past she was found to have grade 2 diastolic dysfunction.  I also  strongly recommended she consider another sleep study.  She was very rarely using her oral appliance.  I discussed with her significant technology improvement in masks and that she may very well be able to tolerate new mask if CPAP was again re-implemented.  On her initial sleep evaluation sleep apnea was moderate with an AHI of 14 and an RDI of 16.5.  She also had significant oxygen desaturation to 81% and loud snoring.  I recommended that she use her oral appliance on a daily basis readdress whether or not to get retested for sleep at her next office visit since she was not ready to consider this option presently.   Since that evaluation, apparently she was unable to obtain valsartan and her ARB therapy was changed to candesartan.  Presently she has been taking candesartan 8 mg twice a day and spironolactone 25 mg daily.  She has noticed some blood pressure variability with blood pressure ranges from 1 40-9 62 systolically.  She also has been using her oral appliance and believes she is sleeping better.  She is now taking atorvastatin which he seems to tolerate on Monday Wednesday and Friday  better than every other day.  She had a follow-up echo Doppler study on August 13, 2018 which showed normal systolic function and wall motion.  There was mild aortic sclerosis with mild AR.  The aortic root and ascending aorta and aortic arch were normal.  She no longer had grade 2 diastolic dysfunction and her diastolic dysfunction was felt to be in the indeterminate range with improvement in her E/ e' .   The patient does not have symptoms concerning for COVID-19 infection (fever, chills, cough, or new shortness of breath).    Past Medical History:  Diagnosis Date   AAA (abdominal aortic aneurysm) ([place]) 08/2014   scanning every 2 years   Arthritis    oa   Complication of anesthesia  slow to awaken in past   Family history of anesthesia complication    slow to awaken   GERD (gastroesophageal reflux disease)    Grade II diastolic dysfunction 62/2297   Noted on ECHO   H/O hiatal hernia    Headache(784.0)    migraines (rare)   Hemorrhoids    Hyperlipidemia    Hypertension 04/01/11   ECHO-EF>55% NUC STRESS TEST- 05/01/12   Hypothyroidism    Mild aortic valve regurgitation 04/2017   Noted on ECHO   Mitral valve regurgitation 04/2017   Mild: Noted on ECHO   Pulmonary nodules 2018   scanning every 6 months   Sleep apnea 05/01/12 & 05/29/12   SLEEP STUDY-[place] [facility] AND SLEEP, NO CPAP USED SINCE MAY 2015 Uses oral device   Past Surgical History:  Procedure Laterality Date   ANKLE SURGERY Right [place] Right 12/14/2014   Procedure: RIGHT THUMB CARPOMETACARPAL ([place]) ARTHROPLASTY;  Surgeon: Leandrew Koyanagi, MD;  Location: [place];  Service: Orthopedics;  Laterality: Right;   CARPOMETACARPEL SUSPENSION PLASTY Left 03/19/2017   Procedure: Left Thumb Ligament Reconstruction and Tendon Interposition;  Surgeon: Leandrew Koyanagi, MD;  Location: [place];  Service: Orthopedics;  Laterality: Left;   COLONOSCOPY  WITH PROPOFOL N/A 12/07/2013   Procedure: COLONOSCOPY WITH PROPOFOL;  Surgeon: Garlan Fair, MD;  [facility];  Service: Endoscopy;  Laterality: N/A;   CYSTECTOMY  1975   FOOT SURGERY Right 9.23.14   FOOT SURGERY Left 10.01.13   HEMORRHOID SURGERY  2005   Miscarriage  1983   TONSILLECTOMY  1964     Current Meds  Medication Sig   acetaminophen (TYLENOL) 500 MG tablet Take 500 mg by mouth 2 (two) times daily.   ALPRAZolam (XANAX) 0.25 MG tablet Take 0.25 mg by mouth at bedtime as needed for sleep.    atorvastatin (LIPITOR) 40 MG tablet Take 0.5 tablets (20 mg total) by mouth every other day.   butalbital-acetaminophen-caffeine (FIORICET, ESGIC) 50-325-40 MG per tablet Take 1 tablet by mouth 2 (two) times daily as needed for headache.   candesartan (ATACAND) 8 MG tablet Take 1 tablet (8 mg total) by mouth 2 (two) times daily.   Cholecalciferol (VITAMIN D) 2000 units tablet Take 2,000 Units by mouth daily.   diclofenac sodium (VOLTAREN) 1 % GEL Apply 2 g topically 4 (four) times daily.   docusate sodium (COLACE) 100 MG capsule Take 100 mg by mouth daily as needed for mild constipation.    escitalopram (LEXAPRO) 20 MG tablet Take 10-20 mg by mouth every morning. Hot flashes   estradiol (ESTRACE) 0.1 MG/GM vaginal cream Place 2 g vaginally 2 (two) times a week. USE TWICE WEEKLY   famotidine (PEPCID) 10 MG tablet Take 10 mg by mouth daily.    levothyroxine (SYNTHROID, LEVOTHROID) 50 MCG tablet Take 50 mcg by mouth daily before breakfast.   simethicone (MYLICON) 80 MG chewable tablet Chew 1 tablet (80 mg total) by mouth every 6 (six) hours as needed for flatulence (bloating).   spironolactone (ALDACTONE) 25 MG tablet Take 0.5 tablets (12.5 mg total) by mouth daily. Monitor BP- then switch to twice daily. (Patient taking differently: Take 25 mg by mouth daily. Monitor BP- then switch to twice daily.)   SUMAtriptan (IMITREX) 50 MG tablet Take 50 mg by mouth every 2  (two) hours as needed for migraine.    traMADol (ULTRAM) 50 MG tablet Take 1-2 tablets (50-100 mg total) by mouth daily as  needed.     Allergies:   Midol [aspirin-cinnamedrine-caffeine], Talwin [pentazocine], Bisoprolol, and Naproxen   Social History   Tobacco Use   Smoking status: Never Smoker   Smokeless tobacco: Never Used  Substance Use Topics   Alcohol use: No    Alcohol/week: 0.0 standard drinks   Drug use: No     Family Hx: The patient's family history includes Breast cancer in her mother; Heart attack in her father; Heart disease in her father; Hyperlipidemia in her maternal grandmother; Hypertension in her brother and maternal grandmother; Ovarian cancer in her mother.  ROS:   Please see the history of present illness.    No fever chills night sweats, No cough No wheezing No chest pain PND orthopnea No edema She is sleeping better since using her oral appliance and feels better and is less fatigued She had noticed some mild myalgias when taking atorvastatin more frequently but this has improved taking only 40 mg on Monday Wednesday and Friday All other systems reviewed and are negative.   Prior CV studies:   The following studies were reviewed today:  08/13/2018 ECHO    IMPRESSIONS   1. The left ventricle has normal systolic function, with an ejection fraction of 55-60%. The cavity size was normal. Left ventricular diastolic Doppler parameters are indeterminate. The E/e' is 9.3. No evidence of left ventricular regional wall motion  abnormalities.  2. The right ventricle has normal systolic function. The cavity was normal. There is no increase in right ventricular wall thickness. Right ventricular systolic pressure could not be assessed.  3. The aortic valve is tricuspid. Mild thickening of the aortic valve. Aortic valve regurgitation is mild by color flow Doppler. No stenosis of the aortic valve.  4. The aortic root, ascending aorta and aortic arch are normal in  size and structure.   Labs/Other Tests and Data Reviewed:    EKG:  An ECG dated 05/22/2018 was personally reviewed today and demonstrated:  ECG (independently read by me): Normal sinus rhythm at 69 bpm.  Poor anterior R wave progression V1 through V3.  Recent Labs: 06/04/2018: ALT 35; BNP 12.6; BUN 16; Creatinine, Ser 0.89; Potassium 4.7; Sodium 141   Recent Lipid Panel No results found for: CHOL, TRIG, HDL, CHOLHDL, LDLCALC, LDLDIRECT  Wt Readings from Last 3 Encounters:  08/25/18 163 lb (73.9 kg)  05/22/18 164 lb 6.4 oz (74.6 kg)  02/03/18 162 lb (73.5 kg)     Objective:    Vital Signs:  BP (!) 162/84 Comment: Taken last night at 10pm   Pulse (!) 51    Ht 5\' 2"  (1.575 m)    Wt 163 lb (73.9 kg)    BMI 29.81 kg/m    On exam she is in no acute distress.  HEENT appears unremarkable  There is no apparent jugular venous distention.  Breathing is normal and not labored. There is no audible wheezing Her pulse rhythm was regular There was no tenderness to her chest wall There was no abdominal discomfort There was no edema There were no neurologic issues She had normal cognition mood and affect  ASSESSMENT & PLAN:    1. Essential hypertension: She is now on candesartan 8 mg twice a day in addition to Spironolactone 25 mg daily.  Blood pressure on June 15 was 162/84 on June 16 ranged from 135/70-140 7/79.  In the evening of June 14 her blood pressure was 120/70.  I am further titrating candesartan and she will take 60 mg in the morning  and continue to take 8 mg in the evening. 2. Diastolic dysfunction: Her most recent echo Doppler study shows improvement from prior grade 2 diastolic dysfunction to now only in the indeterminate range with an E/e prime ratio at 9 compared to 15 previously. 3. Hyperlipidemia: She is now taking atorvastatin 40 mg on Monday Wednesday and Friday.  I am recommending repeat laboratory be obtained in 2 months for reassessment. 4. OSA: Now utilizing oral  appliance every evening.  She is sleeping better.  Fatigue is improved. 5. Family history of abdominal aortic aneurysm 6. History of large hiatal hernia status post Nissen plication  [id number] Education: The signs and symptoms of COVID-19 were discussed with the patient and how to seek care for testing (follow up with PCP or arrange E-visit).  The importance of social distancing was discussed today.  Time:   Today, I have spent 25 minutes with the patient with telehealth technology discussing the above problems.     Medication Adjustments/Labs and Tests Ordered: Current medicines are reviewed at length with the patient today.  Concerns regarding medicines are outlined above.   Tests Ordered: No orders of the defined types were placed in this encounter.   Medication Changes: No orders of the defined types were placed in this encounter.   Follow Up:  In 2 months she will undergo a comprehensive metabolic panel, lipid panel.  Plans for follow-up office visit in 3 months  Signed, Jenny Majestic, MD  08/25/2018 12:04 PM    [place]

## 2018-08-25 ENCOUNTER — Encounter: Payer: Self-pay | Admitting: Cardiovascular Disease

## 2018-08-25 ENCOUNTER — Telehealth (INDEPENDENT_AMBULATORY_CARE_PROVIDER_SITE_OTHER): Payer: Medicare Other | Admitting: Cardiovascular Disease

## 2018-08-25 VITALS — BP 162/84 | HR 51 | Ht 62.0 in | Wt 163.0 lb

## 2018-08-25 DIAGNOSIS — G4733 Obstructive sleep apnea (adult) (pediatric): Secondary | ICD-10-CM | POA: Diagnosis not present

## 2018-08-25 DIAGNOSIS — I10 Essential (primary) hypertension: Secondary | ICD-10-CM

## 2018-08-25 DIAGNOSIS — R0602 Shortness of breath: Secondary | ICD-10-CM | POA: Diagnosis not present

## 2018-08-25 DIAGNOSIS — K449 Diaphragmatic hernia without obstruction or gangrene: Secondary | ICD-10-CM

## 2018-08-25 DIAGNOSIS — Z8249 Family history of ischemic heart disease and other diseases of the circulatory system: Secondary | ICD-10-CM

## 2018-08-25 DIAGNOSIS — I5189 Other ill-defined heart diseases: Secondary | ICD-10-CM

## 2018-08-25 DIAGNOSIS — E7800 Pure hypercholesterolemia, unspecified: Secondary | ICD-10-CM

## 2018-08-31 ENCOUNTER — Telehealth: Payer: Self-pay

## 2018-08-31 NOTE — Telephone Encounter (Signed)
Called patient regarding insurance paperwork- advised to contact back to notify what paperwork it is and why it was needed before Dr.Kelly will sign and send back. Left call back number.

## 2018-09-01 NOTE — Telephone Encounter (Signed)
09/01/2018 Received Signed Attending Physician Statement from [facility] back from Dr. Claiborne Billings.  I inter- office it back to [place] to process.  cbr

## 2018-09-02 ENCOUNTER — Other Ambulatory Visit: Payer: Self-pay

## 2018-09-02 DIAGNOSIS — I714 Abdominal aortic aneurysm, without rupture, unspecified: Secondary | ICD-10-CM

## 2018-09-04 ENCOUNTER — Telehealth: Payer: Self-pay | Admitting: *Deleted

## 2018-09-04 NOTE — Telephone Encounter (Signed)

## 2018-09-07 ENCOUNTER — Ambulatory Visit (INDEPENDENT_AMBULATORY_CARE_PROVIDER_SITE_OTHER): Payer: Medicare Other | Admitting: Family

## 2018-09-07 ENCOUNTER — Ambulatory Visit (INDEPENDENT_AMBULATORY_CARE_PROVIDER_SITE_OTHER)
Admission: RE | Admit: 2018-09-07 | Discharge: 2018-09-07 | Disposition: A | Discharge status: Home / Self Care | Payer: Medicare Other | Source: Ambulatory Visit | Attending: Internal Medicine | Admitting: Internal Medicine

## 2018-09-07 ENCOUNTER — Ambulatory Visit (HOSPITAL_COMMUNITY)
Admission: RE | Admit: 2018-09-07 | Discharge: 2018-09-07 | Disposition: A | Discharge status: Home / Self Care | Payer: Medicare Other | Source: Ambulatory Visit | Attending: Family | Admitting: Family

## 2018-09-07 ENCOUNTER — Other Ambulatory Visit: Payer: Self-pay

## 2018-09-07 ENCOUNTER — Encounter: Payer: Self-pay | Admitting: Family

## 2018-09-07 VITALS — BP 135/81 | HR 61 | Temp 97.8°F | Resp 18 | Ht 62.0 in | Wt 164.0 lb

## 2018-09-07 DIAGNOSIS — I714 Abdominal aortic aneurysm, without rupture, unspecified: Secondary | ICD-10-CM

## 2018-09-07 DIAGNOSIS — Z8249 Family history of ischemic heart disease and other diseases of the circulatory system: Secondary | ICD-10-CM | POA: Diagnosis not present

## 2018-09-07 DIAGNOSIS — R918 Other nonspecific abnormal finding of lung field: Secondary | ICD-10-CM

## 2018-09-07 DIAGNOSIS — R911 Solitary pulmonary nodule: Secondary | ICD-10-CM

## 2018-09-07 DIAGNOSIS — I700 Atherosclerosis of aorta: Secondary | ICD-10-CM | POA: Diagnosis not present

## 2018-09-07 NOTE — Patient Instructions (Signed)
Before your next abdominal ultrasound:  Avoid gas forming foods and beverages the day before the test.   Take two Extra-Strength Gas-X capsules at bedtime the night before the test. Take another two Extra-Strength Gas-X capsules in the middle of the night if you get up to the restroom, if not, first thing in the morning with water.  Do not chew gum.     Abdominal Aortic Aneurysm  An aneurysm is a bulge in one of the blood vessels that carry blood away from the heart (artery). It happens when blood pushes up against a weak or damaged place in the wall of an artery. An abdominal aortic aneurysm happens in the main artery of the body (aorta). Some aneurysms may not cause problems. If it grows, it can burst or tear, causing bleeding inside the body. This is an emergency. It needs to be treated right away. What are the causes? The exact cause of this condition is not known. What increases the risk? The following may make you more likely to get this condition:  Being a female who is 60 years of age or older.  Being white (Caucasian).  Using tobacco.  Having a family history of aneurysms.  Having the following conditions: ? Hardening of the arteries (arteriosclerosis). ? Inflammation of the walls of an artery (arteritis). ? Certain genetic conditions. ? Being very overweight (obesity). ? An infection in the wall of the aorta (infectious aortitis). ? High cholesterol. ? High blood pressure (hypertension). What are the signs or symptoms? Symptoms depend on the size of the aneurysm and how fast it is growing. Most grow slowly and do not cause any symptoms. If symptoms do occur, they may include:  Pain in the belly (abdomen), side, or back.  Feeling full after eating only small amounts of food.  Feeling a throbbing lump in the belly. Symptoms that the aneurysm has burst (ruptured) include:  Sudden, very bad pain in the belly, side, or back.  Feeling sick to your stomach (nauseous).   Throwing up (vomiting).  Feeling light-headed or passing out. How is this treated? Treatment for this condition depends on:  The size of the aneurysm.  How fast it is growing.  Your age.  Your risk of having it burst. If your aneurysm is smaller than 2 inches (5 cm), your doctor may manage it by:  Checking it often to see if it is getting bigger. You may have an imaging test (ultrasound) to check it every 3-6 months, every year, or every few years.  Giving you medicines to: ? Control blood pressure. ? Treat pain. ? Fight infection. If your aneurysm is larger than 2 inches (5 cm), you may need surgery to fix it. Follow these instructions at home: Lifestyle  Do not use any products that have nicotine or tobacco in them. This includes cigarettes, e-cigarettes, and chewing tobacco. If you need help quitting, ask your doctor.  Get regular exercise. Ask your doctor what types of exercise are best for you. Eating and drinking  Eat a heart-healthy diet. This includes eating plenty of: ? Fresh fruits and vegetables. ? Whole grains. ? Low-fat (lean) protein. ? Low-fat dairy products.  Avoid foods that are high in saturated fat and cholesterol. These foods include red meat and some dairy products.  Do not drink alcohol if: ? Your doctor tells you not to drink. ? You are pregnant, may be pregnant, or are planning to become pregnant.  If you drink alcohol: ? Limit how much you use   to:  0-1 drink a day for women.  0-2 drinks a day for men. ? Be aware of how much alcohol is in your drink. In the [place] one drink equals any of these:  One typical bottle of beer (12 oz).  One-half glass of wine (5 oz).  One shot of hard liquor (1 oz). General instructions  Take over-the-counter and prescription medicines only as told by your doctor.  Keep your blood pressure within normal limits. Ask your doctor what your blood pressure should be.  Have your blood sugar (glucose) level  and cholesterol levels checked regularly. Keep your blood sugar level and cholesterol levels within normal limits.  Avoid heavy lifting and activities that take a lot of effort. Ask your doctor what activities are safe for you.  Keep all follow-up visits as told by your doctor. This is important. ? Talk to your doctor about regular screenings to see if the aneurysm is getting bigger. Contact a doctor if you:  Have pain in your belly, side, or back.  Have a throbbing feeling in your belly.  Have a family history of aneurysms. Get help right away if you:  Have sudden, bad pain in your belly, side, or back.  Feel sick to your stomach.  Throw up.  Have trouble pooping (constipation).  Have trouble peeing (urinating).  Feel light-headed.  Have a fast heart rate when you stand.  Have sweaty skin that is cold to the touch (clammy).  Have shortness of breath.  Have a fever. These symptoms may be an emergency. Do not wait to see if the symptoms will go away. Get medical help right away. Call your local emergency services (911 in the [place] Do not drive yourself to the hospital. Summary  An aneurysm is a bulge in one of the blood vessels that carry blood away from the heart (artery). Some aneurysms may not cause problems.  You may need to have yours checked often. If it grows, it can burst or tear. This causes bleeding inside the body. It needs to be treated right away.  Follow instructions from your doctor about healthy lifestyle changes.  Keep all follow-up visits as told by your doctor. This is important. This information is not intended to replace advice given to you by your health care provider. Make sure you discuss any questions you have with your health care provider. Document Released: 06/22/2012 Document Revised: 06/15/2018 Document Reviewed: 10/04/2017 Elsevier Patient Education  2020 Elsevier Inc.  

## 2018-09-07 NOTE — Progress Notes (Signed)
[facility]   CC: Follow up Abdominal Aortic Aneurysm  History of Present Illness  Jenny Bradford is a 68 y.o. (07-31-1950) female who returns today for 2 year follow up of her small AAA. This was initially detected on an ultrasound. Maximum diameter was 2.3 cm. She does have a significant family history of aneurysms in her father and aunt.   She is medically managed for hypertension which has been fairly well controlled with systolics in the 540G.   Dr. Trula Slade indicated in his last evaluation on 07-22-14 that he would like to see the pt keep her systolic blood pressure less than 130. Dr. Claiborne Billings is helping her manage her blood pressure, states it has been unstable in the last 2 months. She states she had an echocardiogram in March 2020, and states her grade 2 diastolic dysfunction has improved.   The patient denies claudication type symptoms in her legs with walking, but she does have some myalgia that has been attributed to the statin.  The patient denies history of stroke or TIA symptoms, but her father had a history of stroke (intracranial bleed), MI, and low platelets.  Pt states she has severe OA in multiple joints, especially her hands.  She is a retired Software engineer from [employer]  She tries to walk a mile daily.  Diabetic: No Tobacco use: non-smoker  Pt meds include: Statin :Yes Betablocker: No, she cannot take due to exercise intolerance  ASA: No, states she has IBS, she did take in the past, states it did not cause her any issues Other anticoagulants/antiplatelets: no   Past Medical History:  Diagnosis Date  . AAA (abdominal aortic aneurysm) ([place]) 08/2014   scanning every 2 years  . Arthritis    oa  . Complication of anesthesia    slow to awaken in past  . Family history of anesthesia complication    slow to awaken  . GERD (gastroesophageal reflux disease)   . Grade II diastolic dysfunction 86/7619   Noted on ECHO  . H/O  hiatal hernia   . Headache(784.0)    migraines (rare)  . Hemorrhoids   . Hyperlipidemia   . Hypertension 04/01/11   ECHO-EF>55% NUC STRESS TEST- 05/01/12  . Hypothyroidism   . Mild aortic valve regurgitation 04/2017   Noted on ECHO  . Mitral valve regurgitation 04/2017   Mild: Noted on ECHO  . Pulmonary nodules 2018   scanning every 6 months  . Sleep apnea 05/01/12 & 05/29/12   SLEEP STUDY- HEART AND SLEEP, NO CPAP USED SINCE MAY 2015 Uses oral device   Past Surgical History:  Procedure Laterality Date  . ANKLE SURGERY Right 1980  . CARPOMETACARPEL SUSPENSION PLASTY Right 12/14/2014   Procedure: RIGHT THUMB CARPOMETACARPAL ([place]) ARTHROPLASTY;  Surgeon: Leandrew Koyanagi, MD;  Location: [facility];  Service: Orthopedics;  Laterality: Right;  . CARPOMETACARPEL SUSPENSION PLASTY Left 03/19/2017   Procedure: Left Thumb Ligament Reconstruction and Tendon Interposition;  Surgeon: Leandrew Koyanagi, MD;  Location: [facility];  Service: Orthopedics;  Laterality: Left;  . COLONOSCOPY WITH PROPOFOL N/A 12/07/2013   Procedure: COLONOSCOPY WITH PROPOFOL;  Surgeon: Garlan Fair, MD;  Location: [facility];  Service: Endoscopy;  Laterality: N/A;  . CYSTECTOMY  1975  . FOOT SURGERY Right 9.23.14  . FOOT SURGERY Left 10.01.13  . [place]  2005  . Miscarriage  4  . TONSILLECTOMY  1964   Social History Social History   Socioeconomic  History  . Marital status: Married    Spouse name: Not on file  . Number of children: Not on file  . Years of education: Not on file  . Highest education level: Not on file  Occupational History  . Not on file  Social Needs  . Financial resource strain: Not on file  . Food insecurity    Worry: Not on file    Inability: Not on file  . Transportation needs    Medical: Not on file    Non-medical: Not on file  Tobacco Use  . Smoking status: Never Smoker  . Smokeless tobacco: Never Used  Substance and Sexual  Activity  . Alcohol use: No    Alcohol/week: 0.0 standard drinks  . Drug use: No  . Sexual activity: Not on file  Lifestyle  . Physical activity    Days per week: Not on file    Minutes per session: Not on file  . Stress: Not on file  Relationships  . Social Herbalist on phone: Not on file    Gets together: Not on file    Attends religious service: Not on file    Active member of club or organization: Not on file    Attends meetings of clubs or organizations: Not on file    Relationship status: Not on file  . Intimate partner violence    Fear of current or ex partner: Not on file    Emotionally abused: Not on file    Physically abused: Not on file    Forced sexual activity: Not on file  Other Topics Concern  . Not on file  Social History Narrative  . Not on file   Family History Family History  Problem Relation Age of Onset  . Breast cancer Mother   . Ovarian cancer Mother   . Heart attack Father   . Heart disease Father   . Hypertension Brother   . Hyperlipidemia Maternal Grandmother   . Hypertension Maternal Grandmother     Current Outpatient Medications on File Prior to Visit  Medication Sig Dispense Refill  . acetaminophen (TYLENOL) 500 MG tablet Take 500 mg by mouth 2 (two) times daily.    Marland Kitchen ALPRAZolam (XANAX) 0.25 MG tablet Take 0.25 mg by mouth at bedtime as needed for sleep.     . butalbital-acetaminophen-caffeine (FIORICET, ESGIC) 50-325-40 MG per tablet Take 1 tablet by mouth 2 (two) times daily as needed for headache.    . candesartan (ATACAND) 8 MG tablet Take 24 mg by mouth daily.    . Cholecalciferol (VITAMIN D) 2000 units tablet Take 2,000 Units by mouth daily.    . diclofenac sodium (VOLTAREN) 1 % GEL Apply 2 g topically 4 (four) times daily. 1 Tube 5  . docusate sodium (COLACE) 100 MG capsule Take 100 mg by mouth daily as needed for mild constipation.     Marland Kitchen escitalopram (LEXAPRO) 20 MG tablet Take 10-20 mg by mouth every morning. Hot flashes     . estradiol (ESTRACE) 0.1 MG/GM vaginal cream Place 2 g vaginally 2 (two) times a week. USE TWICE WEEKLY    . famotidine (PEPCID) 10 MG tablet Take 10 mg by mouth daily.     Marland Kitchen levothyroxine (SYNTHROID, LEVOTHROID) 50 MCG tablet Take 50 mcg by mouth daily before breakfast.    . simethicone (MYLICON) 80 MG chewable tablet Chew 1 tablet (80 mg total) by mouth every 6 (six) hours as needed for flatulence (bloating). 30 tablet 0  .  spironolactone (ALDACTONE) 25 MG tablet Take 0.5 tablets (12.5 mg total) by mouth daily. Monitor BP- then switch to twice daily. (Patient taking differently: Take 25 mg by mouth daily. Monitor BP- then switch to twice daily.) 90 tablet 3  . SUMAtriptan (IMITREX) 50 MG tablet Take 50 mg by mouth every 2 (two) hours as needed for migraine.     Marland Kitchen atorvastatin (LIPITOR) 40 MG tablet Take 0.5 tablets (20 mg total) by mouth every other day. 60 tablet 0  . calcium carbonate (TUMS) 500 MG chewable tablet Chew 1 tablet by mouth daily as needed for indigestion.     . polyethylene glycol (MIRALAX / GLYCOLAX) packet Take 17 g by mouth daily as needed for mild constipation or moderate constipation.    . traMADol (ULTRAM) 50 MG tablet TAKE 1 TO 2 TABLETS BY MOUTH THREE TIMES DAILY AS NEEDED FOR PAIN 30 tablet 2   No current facility-administered medications on file prior to visit.    Allergies  Allergen Reactions  . Midol [Aspirin-Cinnamedrine-Caffeine] Other (See Comments)    DIZZINESS  . Talwin [Pentazocine] Other (See Comments)    EXTREME DROWSINESS  . Bisoprolol Other (See Comments)    weakness  . Naproxen Rash    ROS: See HPI for pertinent positives and negatives.  Physical Examination  Vitals:   09/07/18 0834  BP: 135/81  Pulse: 61  Resp: 18  Temp: 97.8 F (36.6 C)  SpO2: 100%  Weight: 164 lb (74.4 kg)  Height: 5\' 2"  (1.575 m)   Body mass index is 30 kg/m.  General: A&O x 3, WD, obese female. HEENT: Grossly intact and WNL.  Pulmonary: Sym exp,  respirations are non labored, good air movement in all fields, CTAB, no rales, rhonchi, or wheezing. Cardiac: Regular rhythm and rate, no detected murmur.  Carotid Bruits Right Left   Negative Negative   Adominal aortic pulse is not palpable Radial pulses are 1-2+ palpable                          VASCULAR EXAM:                                                                                                         LE Pulses Right Left       FEMORAL  2+ palpable  2+ palpable        POPLITEAL  not palpable   not palpable       POSTERIOR TIBIAL  not palpable   not palpable        DORSALIS PEDIS      ANTERIOR TIBIAL not palpable  not palpable     Gastrointestinal: soft, NTND, -G/R, - HSM, - masses palpated, - CVAT B. Musculoskeletal: M/S 5/5 throughout except 4/5 left hand, Extremities without ischemic changes. All toes are pink and warm with brisk capillary refill. Skin: No rashes, no ulcers, no cellulitis.   Neurologic: CN 2-12 intact, Pain and light touch intact in extremities are intact, Motor exam as listed above. Psychiatric: Normal thought content, mood  appropriate to clinical situation.    DATA  AAA Duplex (09/07/2018):  Previous size:  2.3 cm (Date: 09-04-16); Right CIA: 1.1 cm; Left CIA: 1.0 cm   Current size:  2.14 cm (Date: 09-07-18); Right CIA: 1.1 cm; Left CIA: 1.1 cm   Medical Decision Making  The patient is a 68 y.o. female who presents with asymptomatic AAA with no increase in size, at 2.14 cm today, very small AAA.   Based on this patient's exam and diagnostic studies, the patient will follow up in 2 years with the following studies: AAA duplex.  Consideration for repair of AAA would be made when the size is 5- 5.5cm, growth > 1 cm/yr, and symptomatic status.  Abdominal aortic aneurysm less than 5-1/2 cm in diameter has less then 1/2% risk of rupture per year.        The patient was given information about AAA including signs, symptoms, treatment, and  how to minimize the risk of enlargement and rupture of aneurysms.    I emphasized the importance of maximal medical management including strict control of blood pressure, blood glucose, and lipid levels, antiplatelet agents, obtaining regular exercise, and continued  cessation of smoking.   The patient was advised to call 911 should the patient experience sudden onset abdominal or back pain.   Thank you for allowing [place] to participate in this patient's care.  Clemon Chambers, RN, MSN, FNP-C [facility] Office: [phone]  Clinic Physician: Donzetta Matters  09/07/2018, 8:40 AM

## 2018-09-08 NOTE — Progress Notes (Signed)
Called and left detailed msg on machine ok per DPR.

## 2018-09-09 ENCOUNTER — Telehealth: Payer: Self-pay | Admitting: *Deleted

## 2018-09-09 ENCOUNTER — Other Ambulatory Visit: Payer: Self-pay

## 2018-09-09 DIAGNOSIS — E782 Mixed hyperlipidemia: Secondary | ICD-10-CM

## 2018-09-09 DIAGNOSIS — I10 Essential (primary) hypertension: Secondary | ICD-10-CM

## 2018-09-09 DIAGNOSIS — Z8249 Family history of ischemic heart disease and other diseases of the circulatory system: Secondary | ICD-10-CM

## 2018-09-09 DIAGNOSIS — R0602 Shortness of breath: Secondary | ICD-10-CM

## 2018-09-09 DIAGNOSIS — R002 Palpitations: Secondary | ICD-10-CM

## 2018-09-09 DIAGNOSIS — R531 Weakness: Secondary | ICD-10-CM

## 2018-09-09 NOTE — Telephone Encounter (Signed)
Called and scheduled patient for late September appointment with Dr. Claiborne Billings.  She states she needs to have lab work prior to her visit---

## 2018-09-09 NOTE — Telephone Encounter (Signed)
Spoke to patient she stated she would like to have fasting lab with a B12 checked in addtion to the other labs Dr.Kelly orders.Advised I will place orders and mail to her.Advised she can have lab done 1 week before her Sept. appointment with Dr.Kelly.

## 2018-09-10 MED FILL — ALPRAZolam 0.25 MG TABS: 0.25 | 30 days supply | Qty: 30 | Fill #2

## 2018-09-10 MED FILL — ATORVASTATIN 40 MG TABLET: 40 | 90 days supply | Qty: 23 | Fill #1

## 2018-09-10 MED FILL — ESCITALOPRAM 20 MG TABLET: 20 | 30 days supply | Qty: 30 | Fill #10

## 2018-09-10 MED FILL — HYDROCORTISONE 2.5% CREAM: 2.5 | 15 days supply | Qty: 60 | Fill #2

## 2018-09-10 MED FILL — SPIRONOLACTONE 25 MG TABS: 25 | 45 days supply | Qty: 90 | Fill #2

## 2018-09-21 ENCOUNTER — Telehealth: Payer: Self-pay | Admitting: Internal Medicine

## 2018-09-21 NOTE — Telephone Encounter (Signed)
Notes recorded by Tanda Rockers, MD on 09/08/2018 at 4:39 AM EDT  Call patient : Study is unchanged so rec f/u in one year to close the loop, placed in reminder file   Called and spoke with pt letting her know the results of the CT. Pt verbalized understanding. Nothing further needed.

## 2018-09-22 ENCOUNTER — Encounter: Payer: Self-pay | Admitting: Orthopaedic Surgery

## 2018-09-22 NOTE — Telephone Encounter (Signed)
Sorry just saw this message.  Would you mind sending one in for PT eval and treat neck and back pain.  Modalities prn.  For [facility] PT please.  I think Kathlee Nations has a signature stamp at her desk.  Thanks.

## 2018-09-25 MED FILL — CEPHALEXIN 500 MG CAPSULE: 500 | 4 days supply | Qty: 16 | Fill #0

## 2018-09-28 DIAGNOSIS — M545 Low back pain: Secondary | ICD-10-CM | POA: Diagnosis not present

## 2018-09-28 DIAGNOSIS — M542 Cervicalgia: Secondary | ICD-10-CM | POA: Diagnosis not present

## 2018-09-28 MED FILL — CIPROFLOXACIN HCL 500 MG TA: 500 | 5 days supply | Qty: 10 | Fill #0

## 2018-10-05 DIAGNOSIS — M542 Cervicalgia: Secondary | ICD-10-CM | POA: Diagnosis not present

## 2018-10-05 DIAGNOSIS — M545 Low back pain: Secondary | ICD-10-CM | POA: Diagnosis not present

## 2018-10-12 DIAGNOSIS — R300 Dysuria: Secondary | ICD-10-CM | POA: Diagnosis not present

## 2018-10-12 DIAGNOSIS — M542 Cervicalgia: Secondary | ICD-10-CM | POA: Diagnosis not present

## 2018-10-12 DIAGNOSIS — M545 Low back pain: Secondary | ICD-10-CM | POA: Diagnosis not present

## 2018-10-15 ENCOUNTER — Other Ambulatory Visit: Payer: Self-pay

## 2018-10-15 MED FILL — ESTRADIOL 0.1 MG/GM CREA: 0.1 | 30 days supply | Qty: 43 | Fill #0

## 2018-10-15 NOTE — Telephone Encounter (Signed)
Received refill request for Candesartan. There are discrepancies regarding the strength between medical record and patient report. Per request from [facility] - pt states she is taking 1.5 BID and need new Rx.   Per last office note, 08/25/18, she was taking 1 tablet BID, no clear documentation regarding any changes.   As of today, 10/15/18, chart states that she takes 3 tablets (24 mg total) by mouth daily.   Called patient to verify current dose. lmtcb

## 2018-10-16 MED FILL — ALPRAZolam 0.25 MG TABS: 0.25 | 30 days supply | Qty: 30 | Fill #0

## 2018-10-16 MED FILL — ESCITALOPRAM 20 MG TABLET: 20 | 90 days supply | Qty: 90 | Fill #0

## 2018-10-20 DIAGNOSIS — R531 Weakness: Secondary | ICD-10-CM

## 2018-10-20 MED ORDER — CANDESARTAN CILEXETIL 8 MG PO TABS: ORAL_TABLET | ORAL | 3 refills | Status: DC

## 2018-10-21 MED ORDER — CANDESARTAN CILEXETIL 16 MG PO TABS: 24.0000 mg | ORAL_TABLET | Freq: Every day | ORAL | 6 refills | Status: DC

## 2018-10-21 MED FILL — CANDESARTAN CILEXETIL 16 MG: 16 | 30 days supply | Qty: 45 | Fill #0

## 2018-10-26 DIAGNOSIS — M542 Cervicalgia: Secondary | ICD-10-CM | POA: Diagnosis not present

## 2018-10-26 DIAGNOSIS — M545 Low back pain: Secondary | ICD-10-CM | POA: Diagnosis not present

## 2018-10-27 DIAGNOSIS — N3000 Acute cystitis without hematuria: Secondary | ICD-10-CM | POA: Diagnosis not present

## 2018-10-27 DIAGNOSIS — N760 Acute vaginitis: Secondary | ICD-10-CM | POA: Diagnosis not present

## 2018-10-27 MED FILL — valACYclovir HCL 1 GM TABS: 1 | 10 days supply | Qty: 10 | Fill #0

## 2018-10-28 DIAGNOSIS — M542 Cervicalgia: Secondary | ICD-10-CM | POA: Diagnosis not present

## 2018-10-28 DIAGNOSIS — M545 Low back pain: Secondary | ICD-10-CM | POA: Diagnosis not present

## 2018-11-02 MED FILL — TRIAMCINOLONE ACETONIDE 0.5: 0.5 | 5 days supply | Qty: 15 | Fill #0

## 2018-11-04 DIAGNOSIS — K581 Irritable bowel syndrome with constipation: Secondary | ICD-10-CM | POA: Diagnosis not present

## 2018-11-04 MED FILL — LINZESS 72 MCG CAPSULE: 72 | 15 days supply | Qty: 15 | Fill #0

## 2018-11-06 DIAGNOSIS — M542 Cervicalgia: Secondary | ICD-10-CM | POA: Diagnosis not present

## 2018-11-06 DIAGNOSIS — M545 Low back pain: Secondary | ICD-10-CM | POA: Diagnosis not present

## 2018-11-09 DIAGNOSIS — N760 Acute vaginitis: Secondary | ICD-10-CM | POA: Diagnosis not present

## 2018-11-09 DIAGNOSIS — N898 Other specified noninflammatory disorders of vagina: Secondary | ICD-10-CM | POA: Diagnosis not present

## 2018-11-09 DIAGNOSIS — R898 Other abnormal findings in specimens from other organs, systems and tissues: Secondary | ICD-10-CM | POA: Diagnosis not present

## 2018-11-09 DIAGNOSIS — M545 Low back pain: Secondary | ICD-10-CM | POA: Diagnosis not present

## 2018-11-09 DIAGNOSIS — Z113 Encounter for screening for infections with a predominantly sexual mode of transmission: Secondary | ICD-10-CM | POA: Diagnosis not present

## 2018-11-09 DIAGNOSIS — M542 Cervicalgia: Secondary | ICD-10-CM | POA: Diagnosis not present

## 2018-11-09 DIAGNOSIS — N8189 Other female genital prolapse: Secondary | ICD-10-CM | POA: Diagnosis not present

## 2018-11-09 DIAGNOSIS — R350 Frequency of micturition: Secondary | ICD-10-CM | POA: Diagnosis not present

## 2018-11-09 MED FILL — FLUCONAZOLE 150 MG TABS: 150 | 3 days supply | Qty: 2 | Fill #0

## 2018-11-09 MED FILL — NITROFURANTOIN MONO-MCR 100: 100 | 5 days supply | Qty: 10 | Fill #0

## 2018-11-11 DIAGNOSIS — M542 Cervicalgia: Secondary | ICD-10-CM | POA: Diagnosis not present

## 2018-11-11 DIAGNOSIS — M545 Low back pain: Secondary | ICD-10-CM | POA: Diagnosis not present

## 2018-11-17 DIAGNOSIS — M542 Cervicalgia: Secondary | ICD-10-CM | POA: Diagnosis not present

## 2018-11-17 DIAGNOSIS — M545 Low back pain: Secondary | ICD-10-CM | POA: Diagnosis not present

## 2018-11-17 MED FILL — HYDROCORTISONE 2.5% CREAM: 2.5 | 15 days supply | Qty: 60 | Fill #3

## 2018-11-17 MED FILL — CANDESARTAN CILEXETIL 16 MG: 16 | 30 days supply | Qty: 45 | Fill #1

## 2018-11-17 MED FILL — ESTRADIOL 0.1 MG/GM CRM: 0.1 | 30 days supply | Qty: 43 | Fill #1

## 2018-11-17 MED FILL — ALPRAZolam 0.25 MG TABS: 0.25 | 30 days supply | Qty: 30 | Fill #1

## 2018-11-17 MED FILL — LEVOTHYROXINE 50 MCG TABLET: 50 | 90 days supply | Qty: 90 | Fill #1

## 2018-11-17 MED FILL — traMADol HCL 50 MG TABS: 50 | 8 days supply | Qty: 16 | Fill #1

## 2018-11-19 DIAGNOSIS — M545 Low back pain: Secondary | ICD-10-CM | POA: Diagnosis not present

## 2018-11-19 DIAGNOSIS — M542 Cervicalgia: Secondary | ICD-10-CM | POA: Diagnosis not present

## 2018-11-20 DIAGNOSIS — Z4689 Encounter for fitting and adjustment of other specified devices: Secondary | ICD-10-CM | POA: Diagnosis not present

## 2018-11-20 DIAGNOSIS — R350 Frequency of micturition: Secondary | ICD-10-CM | POA: Diagnosis not present

## 2018-11-20 DIAGNOSIS — N8110 Cystocele, unspecified: Secondary | ICD-10-CM | POA: Diagnosis not present

## 2018-11-20 DIAGNOSIS — N393 Stress incontinence (female) (male): Secondary | ICD-10-CM | POA: Diagnosis not present

## 2018-11-20 DIAGNOSIS — N816 Rectocele: Secondary | ICD-10-CM | POA: Diagnosis not present

## 2018-11-20 DIAGNOSIS — N3281 Overactive bladder: Secondary | ICD-10-CM | POA: Diagnosis not present

## 2018-11-23 ENCOUNTER — Other Ambulatory Visit: Payer: Self-pay

## 2018-11-23 DIAGNOSIS — E782 Mixed hyperlipidemia: Secondary | ICD-10-CM | POA: Diagnosis not present

## 2018-11-23 DIAGNOSIS — K449 Diaphragmatic hernia without obstruction or gangrene: Secondary | ICD-10-CM | POA: Diagnosis not present

## 2018-11-23 DIAGNOSIS — R0602 Shortness of breath: Secondary | ICD-10-CM | POA: Diagnosis not present

## 2018-11-23 DIAGNOSIS — M545 Low back pain: Secondary | ICD-10-CM | POA: Diagnosis not present

## 2018-11-23 DIAGNOSIS — R002 Palpitations: Secondary | ICD-10-CM | POA: Diagnosis not present

## 2018-11-23 DIAGNOSIS — G4733 Obstructive sleep apnea (adult) (pediatric): Secondary | ICD-10-CM | POA: Diagnosis not present

## 2018-11-23 DIAGNOSIS — R531 Weakness: Secondary | ICD-10-CM | POA: Diagnosis not present

## 2018-11-23 DIAGNOSIS — E039 Hypothyroidism, unspecified: Secondary | ICD-10-CM | POA: Diagnosis not present

## 2018-11-23 DIAGNOSIS — I519 Heart disease, unspecified: Secondary | ICD-10-CM | POA: Diagnosis not present

## 2018-11-23 DIAGNOSIS — E7800 Pure hypercholesterolemia, unspecified: Secondary | ICD-10-CM | POA: Diagnosis not present

## 2018-11-23 DIAGNOSIS — M542 Cervicalgia: Secondary | ICD-10-CM | POA: Diagnosis not present

## 2018-11-23 DIAGNOSIS — Z8249 Family history of ischemic heart disease and other diseases of the circulatory system: Secondary | ICD-10-CM | POA: Diagnosis not present

## 2018-11-23 DIAGNOSIS — I10 Essential (primary) hypertension: Secondary | ICD-10-CM | POA: Diagnosis not present

## 2018-11-24 LAB — LIPID PANEL
Chol/HDL Ratio: 3.1 ratio (ref 0.0–4.4)
Cholesterol, Total: 162 mg/dL (ref 100–199)
HDL: 53 mg/dL (ref >39)
LDL Chol Calc (NIH): 84 mg/dL (ref 0–99)
Triglycerides: 147 mg/dL (ref 0–149)
VLDL Cholesterol Cal: 25 mg/dL (ref 5–40)

## 2018-11-24 LAB — BRAIN NATRIURETIC PEPTIDE: BNP: 30.6 pg/mL (ref 0.0–100.0)

## 2018-11-24 LAB — CBC WITH DIFFERENTIAL/PLATELET
Basophils Absolute: 0 10*3/uL (ref 0.0–0.2)
Basos: 1 %
EOS (ABSOLUTE): 0.1 10*3/uL (ref 0.0–0.4)
Eos: 2 %
Hematocrit: 40.1 % (ref 34.0–46.6)
Hemoglobin: 13.7 g/dL (ref 11.1–15.9)
Immature Grans (Abs): 0 10*3/uL (ref 0.0–0.1)
Immature Granulocytes: 1 %
Lymphocytes Absolute: 0.6 10*3/uL — ABNORMAL LOW (ref 0.7–3.1)
Lymphs: 12 %
MCH: 33 pg (ref 26.6–33.0)
MCHC: 34.2 g/dL (ref 31.5–35.7)
MCV: 97 fL (ref 79–97)
Monocytes Absolute: 0.2 10*3/uL (ref 0.1–0.9)
Monocytes: 5 %
Neutrophils Absolute: 4 10*3/uL (ref 1.4–7.0)
Neutrophils: 79 %
Platelets: 273 10*3/uL (ref 150–450)
RBC: 4.15 x10E6/uL (ref 3.77–5.28)
RDW: 12.3 % (ref 11.7–15.4)
WBC: 5 10*3/uL (ref 3.4–10.8)

## 2018-11-24 LAB — COMPREHENSIVE METABOLIC PANEL
ALT: 23 IU/L (ref 0–32)
AST: 21 IU/L (ref 0–40)
Albumin/Globulin Ratio: 1.5 (ref 1.2–2.2)
Albumin: 4 g/dL (ref 3.8–4.8)
Alkaline Phosphatase: 71 IU/L (ref 39–117)
BUN/Creatinine Ratio: 14 (ref 12–28)
BUN: 13 mg/dL (ref 8–27)
Bilirubin Total: 0.5 mg/dL (ref 0.0–1.2)
CO2: 20 mmol/L (ref 20–29)
Calcium: 9.4 mg/dL (ref 8.7–10.3)
Chloride: 103 mmol/L (ref 96–106)
Creatinine, Ser: 0.94 mg/dL (ref 0.57–1.00)
GFR calc Af Amer: 74 mL/min/{1.73_m2} (ref >59)
GFR calc non Af Amer: 64 mL/min/{1.73_m2} (ref >59)
Globulin, Total: 2.6 g/dL (ref 1.5–4.5)
Glucose: 80 mg/dL (ref 65–99)
Potassium: 4.6 mmol/L (ref 3.5–5.2)
Sodium: 137 mmol/L (ref 134–144)
Total Protein: 6.6 g/dL (ref 6.0–8.5)

## 2018-11-24 LAB — VITAMIN B12: Vitamin B-12: 549 pg/mL (ref 232–1245)

## 2018-11-24 LAB — MAGNESIUM: Magnesium: 2.2 mg/dL (ref 1.6–2.3)

## 2018-11-24 LAB — VITAMIN D 25 HYDROXY (VIT D DEFICIENCY, FRACTURES): Vit D, 25-Hydroxy: 40.6 ng/mL (ref 30.0–100.0)

## 2018-11-24 LAB — TSH: TSH: 1.83 u[IU]/mL (ref 0.450–4.500)

## 2018-11-24 MED FILL — DOXYCYCLINE HYC 100 MG CAPS: 100 | 10 days supply | Qty: 10 | Fill #0

## 2018-11-26 NOTE — Telephone Encounter (Signed)
See mychart message 10/20/18

## 2018-12-07 ENCOUNTER — Other Ambulatory Visit: Payer: Self-pay

## 2018-12-07 ENCOUNTER — Ambulatory Visit (INDEPENDENT_AMBULATORY_CARE_PROVIDER_SITE_OTHER): Payer: Medicare Other | Admitting: Cardiovascular Disease

## 2018-12-07 DIAGNOSIS — E039 Hypothyroidism, unspecified: Secondary | ICD-10-CM

## 2018-12-07 DIAGNOSIS — I10 Essential (primary) hypertension: Secondary | ICD-10-CM | POA: Diagnosis not present

## 2018-12-07 DIAGNOSIS — K449 Diaphragmatic hernia without obstruction or gangrene: Secondary | ICD-10-CM

## 2018-12-07 DIAGNOSIS — K649 Unspecified hemorrhoids: Secondary | ICD-10-CM | POA: Diagnosis not present

## 2018-12-07 DIAGNOSIS — G4733 Obstructive sleep apnea (adult) (pediatric): Secondary | ICD-10-CM | POA: Diagnosis not present

## 2018-12-07 DIAGNOSIS — I519 Heart disease, unspecified: Secondary | ICD-10-CM | POA: Diagnosis not present

## 2018-12-07 DIAGNOSIS — K625 Hemorrhage of anus and rectum: Secondary | ICD-10-CM | POA: Diagnosis not present

## 2018-12-07 DIAGNOSIS — K5909 Other constipation: Secondary | ICD-10-CM | POA: Diagnosis not present

## 2018-12-07 DIAGNOSIS — E782 Mixed hyperlipidemia: Secondary | ICD-10-CM | POA: Diagnosis not present

## 2018-12-07 DIAGNOSIS — I5189 Other ill-defined heart diseases: Secondary | ICD-10-CM

## 2018-12-07 DIAGNOSIS — E7800 Pure hypercholesterolemia, unspecified: Secondary | ICD-10-CM | POA: Diagnosis not present

## 2018-12-07 MED ORDER — PRAVASTATIN SODIUM 40 MG PO TABS: 20.0000 mg | ORAL_TABLET | Freq: Every evening | ORAL | 1 refills | Status: DC

## 2018-12-07 MED ORDER — CANDESARTAN CILEXETIL 16 MG PO TABS: 16.0000 mg | ORAL_TABLET | Freq: Every day | ORAL | 1 refills | Status: DC

## 2018-12-07 MED ORDER — SPIRONOLACTONE 25 MG PO TABS: 12.5000 mg | ORAL_TABLET | Freq: Every day | ORAL | 1 refills | Status: DC

## 2018-12-07 MED ORDER — EZETIMIBE 10 MG PO TABS: 10.0000 mg | ORAL_TABLET | Freq: Every day | ORAL | 3 refills | Status: DC

## 2018-12-07 MED FILL — LINZESS 72 MCG CAPSULE: 72 | 30 days supply | Qty: 30 | Fill #0

## 2018-12-07 MED FILL — EZETIMIBE 10 MG TABS: 10 | 90 days supply | Qty: 90 | Fill #0

## 2018-12-07 MED FILL — PRAVASTATIN NA 40 MG TAB: 40 | 90 days supply | Qty: 45 | Fill #0

## 2018-12-07 MED FILL — SPIRONOLACTONE 25 MG TABS: 25 | 90 days supply | Qty: 90 | Fill #0

## 2018-12-07 NOTE — Progress Notes (Signed)
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  I had cared for her father [name] for many years who ultimately died on Jan 23, 2014 at [facility]  She established cardiology care with me in February 2019.   She presents for 11-monthfollow-up evaluation.  Ms. MSchneiderhas a history of hypertension for many years.  Most recently, she has been on losartan 75 mg daily in addition to HCTZ 25 mg.  She also has a history of hypothyroidism and has been on levothyroxine at 50 g.  There is a history of hyperlipidemia and she has been on pravastatin 40 mg.  Her father had a history of significant abdominal aortic aneurysm.  She underwent abdominal aortic aneurysm screening and there was no significant aortic dilatation and instructed with a maximum aortic dimension at 2.3 cm.  She has a history of obstructive sleep apnea which was diagnosed in 2014 with an AHI of 14 per hour and RDI of 16.5 per hour.  She had significant oxygen desaturation to a nadir of 81% with rems sleep and had loud snoring.  Apparently should use CPAP for a short while but ultimately became intolerant was ultimately transitioned to an oral appliance for which she is followed by Dr. [name]   When I saw her in February 2019 she had begun to notice that despite taking medication for her blood pressure, her blood pressure elevated.  She was walking 4 days per week for 30 minutes at a time.  She denied any exertionally precipitation of chest pain.  She was unaware of any significant valvular abnormality, although her mother has aortic stenosis.  She also has a very large hiatal hernia and has seen Dr. OPaulita Fujitaand was told that in the future she may require surgery.  Shehe underwent surgery for  paraesophageal hernia repair with a Nissen fundoplication in March 24076  She also has had progressive difficulty with osteoarthritis involving her neck, back, hands, feet, and feet.  He has not been able to exercise as regularly.  She is no longer working since she is unable to stand up the entire day.  She now sees MThedora Hindersfor primary care.  She has been caring for her husband who is permanently disabled since age 68    When I last saw her on December 08, 2017 as result of the generic impurities I had recommended she change from losartan 100 mg daily to olmesartan 40 mg.  Her lipid studies were not optimal on pravastatin and I suggested discontinuance of this and in its place started atorvastatin 40 mg.  An echo Doppler study in February 2019 showed an EF of 60 to 65% with grade 2 diastolic dysfunction, mild AR and mild MR.  An event monitor had shown sinus rhythm with low average heart rate at 50 and high average heart rate at 70 bpm.  She had several atrial couplets and one atrial triplet.  There were no episodes of atrial fibrillation.  There was no ventricular ectopy or pauses.  I saw her in March 2020 after she had called the office and wished to be seen sooner than her scheduled appointment due to progressive fatigue and particularly shortness of breath particularly while  climbing steps.  She had been self adjusting some of her medications and was no longer on olmesartan but has been on valsartan 160 mg.  However she felt better when she cuts this in half and had been taking 80 mg twice a day.  Denied any chest pain and was walking 1-1 and half miles per day.  She was only using CPAP intermittently and had adamantly using a customized oral appliance.   She was evaluated in a telemedicine visit in June 2020.  At that time she was  on candesartan for ARB therapy.  Her dose was further titrated to 60 mg in the morning and 8 mg in the evening.  She was on atorvastatin 40 mg on Monday Wednesday and  Friday for hyperlipidemia.  She was using an oral appliance for obstructive sleep apnea from [name].  An echo Doppler study from August 13, 2018 showed normal systolic function with EF at 55 to 60%.  There was mild aortic sclerosis with mild AR.  The aortic root and ascending aorta and aortic arch were normal in dimensions.  Since I last saw her, she admits to some muscle cramping.  As result she stopped taking atorvastatin for the last several weeks.  Previously she felt she tolerated pravastatin better.  She has noticed fatigue and at times low blood pressures.  She will be undergoing a hysterectomy in late January.  I reviewed laboratory from November 23, 2018.  BUN 13 creatinine 0.94.  LFTs normal.  Stable CBC.  Compared to March 2020, cholesterol increased from 140-162.  LDL increased from 57 while on atorvastatin daily to 84 on her reduced dose.  She presents for evaluation.   Past Medical History:  Diagnosis Date  . AAA (abdominal aortic aneurysm) ([place]) 08/2014   scanning every 2 years  . Arthritis    oa  . Complication of anesthesia    slow to awaken in past  . Family history of anesthesia complication    slow to awaken  . GERD (gastroesophageal reflux disease)   . Grade II diastolic dysfunction 81/8299   Noted on ECHO  . H/O hiatal hernia   . Headache(784.0)    migraines (rare)  . Hemorrhoids   . Hyperlipidemia   . Hypertension 04/01/11   ECHO-EF>55% NUC STRESS TEST- 05/01/12  . Hypothyroidism   . Mild aortic valve regurgitation 04/2017   Noted on ECHO  . Mitral valve regurgitation 04/2017   Mild: Noted on ECHO  . Pulmonary nodules 2018   scanning every 6 months  . Sleep apnea 05/01/12 & 05/29/12   SLEEP STUDY-[facility], NO CPAP USED SINCE MAY 2015 Uses oral device    Past Surgical History:  Procedure Laterality Date  . ANKLE SURGERY Right 1980  . CARPOMETACARPEL SUSPENSION PLASTY Right 12/14/2014   Procedure: RIGHT THUMB CARPOMETACARPAL ([place])  ARTHROPLASTY;  Surgeon: Leandrew Koyanagi, MD;  Location: [place];  Service: Orthopedics;  Laterality: Right;  . CARPOMETACARPEL SUSPENSION PLASTY Left 03/19/2017   Procedure: Left Thumb Ligament Reconstruction and Tendon Interposition;  Surgeon: Leandrew Koyanagi, MD;  Location: [place];  Service: Orthopedics;  Laterality: Left;  . COLONOSCOPY WITH PROPOFOL N/A 12/07/2013   Procedure: COLONOSCOPY WITH PROPOFOL;  Surgeon: Garlan Fair, MD;  Location: [facility];  Service: Endoscopy;  Laterality: N/A;  . CYSTECTOMY  1975  . FOOT SURGERY Right 9.23.14  . FOOT SURGERY Left 10.01.13  . [place]  2005  . Miscarriage  11  .  TONSILLECTOMY  1964    Current Medications: Outpatient Medications Prior to Visit  Medication Sig Dispense Refill  . acetaminophen (TYLENOL) 500 MG tablet Take 500 mg by mouth 2 (two) times daily.    Marland Kitchen ALPRAZolam (XANAX) 0.25 MG tablet Take 0.25 mg by mouth at bedtime as needed for sleep.     . butalbital-acetaminophen-caffeine (FIORICET, ESGIC) 50-325-40 MG per tablet Take 1 tablet by mouth 2 (two) times daily as needed for headache.    . Cholecalciferol (VITAMIN D) 2000 units tablet Take 2,000 Units by mouth daily.    . diclofenac sodium (VOLTAREN) 1 % GEL Apply 2 g topically 4 (four) times daily. 1 Tube 5  . Docusate Sodium 100 MG capsule Take 200 mg by mouth daily as needed for mild constipation. Takes 300 mg  as needed    . escitalopram (LEXAPRO) 20 MG tablet Take 10-20 mg by mouth every morning. Hot flashes    . estradiol (ESTRACE) 0.1 MG/GM vaginal cream Place 2 g vaginally 2 (two) times a week. USE TWICE WEEKLY    . levothyroxine (SYNTHROID, LEVOTHROID) 50 MCG tablet Take 50 mcg by mouth daily before breakfast.    . simethicone (MYLICON) 80 MG chewable tablet Chew 1 tablet (80 mg total) by mouth every 6 (six) hours as needed for flatulence (bloating). 30 tablet 0  . SUMAtriptan (IMITREX) 50 MG tablet Take 50 mg by mouth every 2  (two) hours as needed for migraine.     . candesartan (ATACAND) 16 MG tablet Take 1.5 tablets (24 mg total) by mouth daily. 45 tablet 6  . famotidine (PEPCID) 10 MG tablet Take 10 mg by mouth daily.     Marland Kitchen spironolactone (ALDACTONE) 25 MG tablet Take 0.5 tablets (12.5 mg total) by mouth daily. Monitor BP- then switch to twice daily. (Patient taking differently: Take 25 mg by mouth daily. Monitor BP- then switch to twice daily.) 90 tablet 3  . linaclotide (LINZESS) 72 MCG capsule Linzess 72 mcg capsule    . atorvastatin (LIPITOR) 40 MG tablet Take 0.5 tablets (20 mg total) by mouth every other day. 60 tablet 0  . calcium carbonate (TUMS) 500 MG chewable tablet Chew 1 tablet by mouth daily as needed for indigestion.     . polyethylene glycol (MIRALAX / GLYCOLAX) packet Take 17 g by mouth daily as needed for mild constipation or moderate constipation.    . traMADol (ULTRAM) 50 MG tablet TAKE 1 TO 2 TABLETS BY MOUTH THREE TIMES DAILY AS NEEDED FOR PAIN 30 tablet 2   No facility-administered medications prior to visit.      Allergies:   Midol [aspirin-cinnamedrine-caffeine], Olmesartan, Talwin [pentazocine], Bisoprolol, and Naproxen   Social History   Socioeconomic History  . Marital status: Married    Spouse name: Not on file  . Number of children: Not on file  . Years of education: Not on file  . Highest education level: Not on file  Occupational History  . Not on file  Social Needs  . Financial resource strain: Not on file  . Food insecurity    Worry: Not on file    Inability: Not on file  . Transportation needs    Medical: Not on file    Non-medical: Not on file  Tobacco Use  . Smoking status: Never Smoker  . Smokeless tobacco: Never Used  Substance and Sexual Activity  . Alcohol use: No    Alcohol/week: 0.0 standard drinks  . Drug use: No  . Sexual activity: Not on  file  Lifestyle  . Physical activity    Days per week: Not on file    Minutes per session: Not on file  .  Stress: Not on file  Relationships  . Social Herbalist on phone: Not on file    Gets together: Not on file    Attends religious service: Not on file    Active member of club or organization: Not on file    Attends meetings of clubs or organizations: Not on file    Relationship status: Not on file  Other Topics Concern  . Not on file  Social History Narrative  . 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  No tobacco history.  Family History:  The patient's family history includes Breast cancer in her mother; Heart attack in her father; Heart disease in her father; Hyperlipidemia in her maternal grandmother; Hypertension in her brother and maternal grandmother; Ovarian cancer in her mother.   ROS General: Negative; No fevers, chills, or night sweats; increasing fatigue HEENT: Negative; No changes in vision or hearing, sinus congestion, difficulty swallowing Pulmonary: Negative; No cough, wheezing, shortness of breath, hemoptysis Cardiovascular: See history of present illness, no chest pain, PND, orthopnea. GI: Positive for large hiatal hernia; is post Nissan fundoplication surgery GU: Negative; No dysuria, hematuria, or difficulty voiding Musculoskeletal: Negative; no myalgias, joint pain, or weakness Hematologic/Oncology: Negative; no easy bruising, bleeding Endocrine: Negative; no heat/cold intolerance; no diabetes Neuro: Negative; no changes in balance, headaches Skin: Negative; No rashes or skin lesions Psychiatric: Negative; No behavioral problems, depression Sleep: Positive for OSA, now with a customized oral appliance with mandibular advancement; No snoring, daytime sleepiness, hypersomnolence, bruxism, restless legs, hypnogognic hallucinations, no cataplexy Other comprehensive 14 point system review is negative.   PHYSICAL EXAM:   VS:  BP 90/70   Pulse 61   Ht 5' 3" (1.6 m)   Wt 165 lb (74.8 kg)   BMI 29.23 kg/m      Repeat blood pressure by me 126/70 supine and 102/68 standing  Wt Readings from Last 3 Encounters:  12/07/18 165 lb (74.8 kg)  09/07/18 164 lb (74.4 kg)  08/25/18 163 lb (73.9 kg)    General: Alert, oriented, no distress.  Skin: normal turgor, no rashes, warm and dry HEENT: Normocephalic, atraumatic. Pupils equal round and reactive to light; sclera anicteric; extraocular muscles intact;  Nose without nasal septal hypertrophy Mouth/Parynx benign; Mallinpatti scale 3Neck: No JVD, no carotid bruits; normal carotid upstroke Lungs: clear to ausculatation and percussion; no wheezing or rales Chest wall: without tenderness to palpitation Heart: PMI not displaced, RRR, s1 s2 normal, 1/6 systolic murmur, no diastolic murmur, no rubs, gallops, thrills, or heaves Abdomen: soft, nontender; no hepatosplenomehaly, BS+; abdominal aorta nontender and not dilated by palpation. Back: no CVA tenderness Pulses 2+ Musculoskeletal: No definitive tenderness to palpation ;full range of motion, normal strength, no joint deformities Extremities: no clubbing cyanosis or edema, Homan's sign negative  Neurologic: grossly nonfocal; Cranial nerves grossly wnl Psychologic: Normal mood and affect   Studies/Labs Reviewed:   ECG (independently read by me): Normal sinus rhythm at 61 bpm.  Normal intervals.  No ectopy.  EKG:  EKG is ordered today.  ECG (independently read by me): Normal sinus rhythm at 69 bpm.  Poor anterior R wave progression V1 through V3.  December 08, 2017 ECG (independently read by me): NSR at 66; normal intervals  04/21/2017 ECG (independently  read by me): Normal sinus rhythm at 66 bpm.  No ST segment changes.  Normal intervals.  No ectopy.  Very mild RV conduction delay.  Recent Labs: BMP Latest Ref Rng & Units 11/23/2018 06/04/2018 05/14/2017  Glucose 65 - 99 mg/dL 80 88 129(H)  BUN 8 - 27 mg/dL _0 Creatinine 0.57 - 1.00 mg/dL 0.94 0.89 0.59  BUN/Creat Ratio 12 - _1 -   Sodium 134 - 144 mmol/L 137 141 132(L)  Potassium 3.5 - 5.2 mmol/L 4.6 4.7 4.0  Chloride 96 - 106 mmol/L 103 103 98(L)  CO2 20 - 29 mmol/L _2 Calcium 8.7 - 10.3 mg/dL 9.4 9.3 8.4(L)     Hepatic Function Latest Ref Rng & Units 11/23/2018 06/04/2018  Total Protein 6.0 - 8.5 g/dL 6.6 6.6  Albumin 3.8 - 4.8 g/dL 4.0 4.1  AST 0 - 40 IU/L 21 26  ALT 0 - 32 IU/L 23 35(H)  Alk Phosphatase 39 - 117 IU/L 71 94  Total Bilirubin 0.0 - 1.2 mg/dL 0.5 0.5    CBC Latest Ref Rng & Units 11/23/2018 05/14/2017 05/07/2017  WBC 3.4 - 10.8 x10E3/uL 5.0 16.4(H) 7.3  Hemoglobin 11.1 - 15.9 g/dL 13.7 11.5(L) 13.6  Hematocrit 34.0 - 46.6 % 40.1 34.7(L) 40.6  Platelets 150 - 450 x10E3/uL 273 215 292   Lab Results  Component Value Date   MCV 97 11/23/2018   MCV 96.7 05/14/2017   MCV 96.9 05/07/2017   Lab Results  Component Value Date   TSH 1.830 11/23/2018   No results found for: HGBA1C   BNP    Component Value Date/Time   BNP 30.6 11/23/2018 0918    ProBNP No results found for: PROBNP   Lipid Panel     Component Value Date/Time   CHOL 162 11/23/2018 0918   TRIG 147 11/23/2018 0918   HDL 53 11/23/2018 0918   CHOLHDL 3.1 11/23/2018 0918   LDLCALC 84 11/23/2018 0918     RADIOLOGY: No results found.   Additional studies/ records that were reviewed today include:  I reviewed the patient's prior evaluation at the [facility] in [facility].  following a sleep study.  I reviewed her imaging studies, and prior evaluation by Dr. Melvyn Novas.   ASSESSMENT:    1. Essential hypertension   2. Grade II diastolic dysfunction   3. OSA (obstructive sleep apnea)   4. Large hiatal hernia s/p Nissan plication   5. Pure hypercholesterolemia   6. Hypothyroidism, unspecified type     PLAN:  Ms. 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   Remotely she had been on losartan for hypertension and pravastatin for hyperlipidemia.  Her medications have been adjusted particularly with ARB impurities and apparently most recently she has been taking candesartan 8 mg in the morning and 16 mg at night in addition to spironolactone 25 mg daily.  Her blood pressure today feels mild orthostatic hypotension and when checked by me systolic blood pressure dropped from 126 down to 102 going from supine to standing.  I have recommended she reduce her candesartan and take 60 mg at bedtime.  She will reduce her spironolactone to 12.5 mg daily.  Mostly she had tolerated pravastatin but was not well controlled with treatment.  When on atorvastatin 40 mg LDL cholesterol was excellent but she subsequently developed some myalgias and  apparently has completely stopped taking atorvastatin for the last 2.  I have recommended that she take Zetia 10 mg.  In 2 weeks, she will then resume taking pravastatin 40 mg which she had tolerated in the past.  I checked a CPK and erythrocyte sedimentation rate today in the office and these were normal arguing against myopathy or rhabdomyolysis.  She now admits to excellent compliance with her oral appliance for obstructive sleep apnea.  I reviewed the recent [facility] report obtained by Dr. Ron Parker with her oral appliance.  AHI is 3.3 with therapy.  She is sleeping better and notes definite improvement.  She continues to be on levothyroxine 50 mcg for hypothyroidism.  I will see her in 3 months for reevaluation   Medication Adjustments/Labs and Tests Ordered: Current medicines are reviewed at length with the patient today.  Concerns regarding medicines are outlined above.  Medication changes, Labs and Tests ordered today are listed in the Patient Instructions below. Patient Instructions  Medication Instructions:  Stop Atorvastatin Start Candesartan 16 mg daily. Decrease Spirolactone 12.5 mg daily- may use extra as needed for edema.  Start Zetia 10 mg daily. Start Pravastatin 40 mg (only take 1/2 tablet- 20 mg) in a few weeks depending on lab work and muscle aches.  If you need a refill on your cardiac medications before your next appointment, please call your pharmacy.   Lab work: CPK and Sed Rate today. If you have labs (blood work) drawn today and your tests are completely normal, you will receive your results only by: Marland Kitchen MyChart Message (if you have MyChart) OR . A paper copy in the mail If you have any lab test that is abnormal or we need to change your treatment, we will call you to review the results.  Follow-Up: At [facility], you and your health needs are our priority.  As part of our continuing mission to provide you with exceptional heart care, we have created designated Provider Care Teams.  These Care Teams include your primary Cardiologist (physician) and Advanced Practice Providers (APPs -  Physician Assistants and Nurse Practitioners) who all work together to provide you with the care you need, when you need it. You will need a follow up appointment in 4 months (beginning of Jan).  You may see Dr.Katieann Hungate or one of the following Advanced Practice Providers on your designated Care Team: Almyra Deforest, [place] . Fabian Sharp, PA-C        Signed, [name], MD  12/09/2018 5:25 PM    [facility] [address] [place], [place], [place]  [address] Phone: [phone]

## 2018-12-07 NOTE — Patient Instructions (Signed)
Medication Instructions:  Stop Atorvastatin Start Candesartan 16 mg daily. Decrease Spirolactone 12.5 mg daily- may use extra as needed for edema. Start Zetia 10 mg daily. Start Pravastatin 40 mg (only take 1/2 tablet- 20 mg) in a few weeks depending on lab work and muscle aches.  If you need a refill on your cardiac medications before your next appointment, please call your pharmacy.   Lab work: CPK and Sed Rate today. If you have labs (blood work) drawn today and your tests are completely normal, you will receive your results only by: Marland Kitchen MyChart Message (if you have MyChart) OR . A paper copy in the mail If you have any lab test that is abnormal or we need to change your treatment, we will call you to review the results.  Follow-Up: At [facility], you and your health needs are our priority.  As part of our continuing mission to provide you with exceptional heart care, we have created designated Provider Care Teams.  These Care Teams include your primary Cardiologist (physician) and Advanced Practice Providers (APPs -  Physician Assistants and Nurse Practitioners) who all work together to provide you with the care you need, when you need it. You will need a follow up appointment in 4 months (beginning of Jan).  You may see Dr.Kelly or one of the following Advanced Practice Providers on your designated Care Team: Almyra Deforest, [place] . Fabian Sharp, PA-C

## 2018-12-08 DIAGNOSIS — N8110 Cystocele, unspecified: Secondary | ICD-10-CM | POA: Diagnosis not present

## 2018-12-08 DIAGNOSIS — Z4689 Encounter for fitting and adjustment of other specified devices: Secondary | ICD-10-CM | POA: Diagnosis not present

## 2018-12-08 DIAGNOSIS — K5909 Other constipation: Secondary | ICD-10-CM | POA: Diagnosis not present

## 2018-12-08 DIAGNOSIS — N393 Stress incontinence (female) (male): Secondary | ICD-10-CM | POA: Diagnosis not present

## 2018-12-08 LAB — SEDIMENTATION RATE: Sed Rate: 7 mm/hr (ref 0–40)

## 2018-12-08 LAB — CK: Total CK: 86 U/L (ref 32–182)

## 2018-12-09 ENCOUNTER — Encounter: Payer: Self-pay | Admitting: Cardiovascular Disease

## 2018-12-09 DIAGNOSIS — M545 Low back pain: Secondary | ICD-10-CM | POA: Diagnosis not present

## 2018-12-09 DIAGNOSIS — M542 Cervicalgia: Secondary | ICD-10-CM | POA: Diagnosis not present

## 2018-12-10 DIAGNOSIS — Z1159 Encounter for screening for other viral diseases: Secondary | ICD-10-CM | POA: Diagnosis not present

## 2018-12-11 DIAGNOSIS — M545 Low back pain: Secondary | ICD-10-CM | POA: Diagnosis not present

## 2018-12-11 DIAGNOSIS — M542 Cervicalgia: Secondary | ICD-10-CM | POA: Diagnosis not present

## 2018-12-12 MED FILL — CANDESARTAN CILEXETIL 16 MG: 16 | 90 days supply | Qty: 90 | Fill #0

## 2018-12-15 DIAGNOSIS — K921 Melena: Secondary | ICD-10-CM | POA: Diagnosis not present

## 2018-12-16 ENCOUNTER — Other Ambulatory Visit: Payer: Self-pay | Admitting: Obstetrics & Gynecology

## 2018-12-16 DIAGNOSIS — N83209 Unspecified ovarian cyst, unspecified side: Secondary | ICD-10-CM

## 2018-12-18 DIAGNOSIS — K921 Melena: Secondary | ICD-10-CM | POA: Diagnosis not present

## 2018-12-21 DIAGNOSIS — M545 Low back pain: Secondary | ICD-10-CM | POA: Diagnosis not present

## 2018-12-21 DIAGNOSIS — M542 Cervicalgia: Secondary | ICD-10-CM | POA: Diagnosis not present

## 2018-12-21 MED FILL — ESTRADIOL 0.1 MG/GM CRM: 0.1 | 30 days supply | Qty: 43 | Fill #2

## 2018-12-23 DIAGNOSIS — M545 Low back pain: Secondary | ICD-10-CM | POA: Diagnosis not present

## 2018-12-23 DIAGNOSIS — M542 Cervicalgia: Secondary | ICD-10-CM | POA: Diagnosis not present

## 2018-12-24 MED FILL — ALPRAZolam 0.25 MG TABS: 0.25 | 30 days supply | Qty: 30 | Fill #0

## 2018-12-28 DIAGNOSIS — M542 Cervicalgia: Secondary | ICD-10-CM | POA: Diagnosis not present

## 2018-12-28 DIAGNOSIS — M545 Low back pain: Secondary | ICD-10-CM | POA: Diagnosis not present

## 2018-12-30 DIAGNOSIS — E039 Hypothyroidism, unspecified: Secondary | ICD-10-CM | POA: Diagnosis not present

## 2018-12-30 DIAGNOSIS — E785 Hyperlipidemia, unspecified: Secondary | ICD-10-CM | POA: Diagnosis not present

## 2018-12-30 DIAGNOSIS — Z0000 Encounter for general adult medical examination without abnormal findings: Secondary | ICD-10-CM | POA: Diagnosis not present

## 2018-12-30 DIAGNOSIS — K5901 Slow transit constipation: Secondary | ICD-10-CM | POA: Diagnosis not present

## 2018-12-30 DIAGNOSIS — N812 Incomplete uterovaginal prolapse: Secondary | ICD-10-CM | POA: Diagnosis not present

## 2018-12-30 DIAGNOSIS — Z23 Encounter for immunization: Secondary | ICD-10-CM | POA: Diagnosis not present

## 2018-12-30 DIAGNOSIS — I10 Essential (primary) hypertension: Secondary | ICD-10-CM | POA: Diagnosis not present

## 2018-12-30 DIAGNOSIS — K219 Gastro-esophageal reflux disease without esophagitis: Secondary | ICD-10-CM | POA: Diagnosis not present

## 2018-12-30 DIAGNOSIS — M159 Polyosteoarthritis, unspecified: Secondary | ICD-10-CM | POA: Diagnosis not present

## 2018-12-30 DIAGNOSIS — R350 Frequency of micturition: Secondary | ICD-10-CM | POA: Diagnosis not present

## 2018-12-31 ENCOUNTER — Telehealth: Payer: Self-pay | Admitting: Cardiovascular Disease

## 2018-12-31 DIAGNOSIS — M545 Low back pain: Secondary | ICD-10-CM | POA: Diagnosis not present

## 2018-12-31 DIAGNOSIS — M542 Cervicalgia: Secondary | ICD-10-CM | POA: Diagnosis not present

## 2018-12-31 NOTE — Telephone Encounter (Signed)
12/31/2018 Received FMLA Form from [employer] back from [facility] for Dr. Claiborne Billings to sign.  I put in Dr. Claiborne Billings mail box to complete when he is in office again.  cbr

## 2019-01-04 DIAGNOSIS — M545 Low back pain: Secondary | ICD-10-CM | POA: Diagnosis not present

## 2019-01-04 DIAGNOSIS — M542 Cervicalgia: Secondary | ICD-10-CM | POA: Diagnosis not present

## 2019-01-11 DIAGNOSIS — M545 Low back pain: Secondary | ICD-10-CM | POA: Diagnosis not present

## 2019-01-11 DIAGNOSIS — M542 Cervicalgia: Secondary | ICD-10-CM | POA: Diagnosis not present

## 2019-01-14 DIAGNOSIS — M542 Cervicalgia: Secondary | ICD-10-CM | POA: Diagnosis not present

## 2019-01-14 DIAGNOSIS — M545 Low back pain: Secondary | ICD-10-CM | POA: Diagnosis not present

## 2019-01-18 DIAGNOSIS — M545 Low back pain: Secondary | ICD-10-CM | POA: Diagnosis not present

## 2019-01-18 DIAGNOSIS — M542 Cervicalgia: Secondary | ICD-10-CM | POA: Diagnosis not present

## 2019-01-19 ENCOUNTER — Other Ambulatory Visit: Payer: Self-pay

## 2019-01-19 ENCOUNTER — Ambulatory Visit (INDEPENDENT_AMBULATORY_CARE_PROVIDER_SITE_OTHER): Payer: Medicare Other | Admitting: Orthopaedic Surgery

## 2019-01-19 ENCOUNTER — Encounter: Payer: Self-pay | Admitting: Orthopaedic Surgery

## 2019-01-19 ENCOUNTER — Telehealth: Payer: Self-pay

## 2019-01-19 DIAGNOSIS — M1711 Unilateral primary osteoarthritis, right knee: Secondary | ICD-10-CM

## 2019-01-19 DIAGNOSIS — M1712 Unilateral primary osteoarthritis, left knee: Secondary | ICD-10-CM | POA: Diagnosis not present

## 2019-01-19 DIAGNOSIS — M25559 Pain in unspecified hip: Secondary | ICD-10-CM

## 2019-01-19 MED ORDER — METHOCARBAMOL 500 MG PO TABS: 500.0000 mg | ORAL_TABLET | Freq: Four times a day (QID) | ORAL | 2 refills | Status: DC | PRN

## 2019-01-19 MED FILL — METHOCARBAMOL 500 MG TABLET: 500 | 7 days supply | Qty: 30 | Fill #0

## 2019-01-19 NOTE — Progress Notes (Signed)
Office Visit Note   Patient: Jenny Bradford           Date of Birth: 1950/07/21           MRN: [id number] Visit Date: 01/19/2019              Requested by: [name], [address],  [place] [address] PCP: [name], FNP   Assessment & Plan: Visit Diagnoses:  1. Primary osteoarthritis of left knee   2. Primary osteoarthritis of right knee   3. Hip pain     Plan: My impression is bilateral knee osteoarthritis and left hip IT band syndrome.  Robaxin was refilled today.  She would like to try viscosupplementation injections for her knees.  I have sent her to PT with modalities for the left hip IT band syndrome.  Questions encouraged and answered.  This patient is diagnosed with osteoarthritis of the knee(s).    Radiographs show evidence of joint space narrowing, osteophytes, subchondral sclerosis and/or subchondral cysts.  This patient has knee pain which interferes with functional and activities of daily living.    This patient has experienced inadequate response, adverse effects and/or intolerance with conservative treatments such as acetaminophen, NSAIDS, topical creams, physical therapy or regular exercise, knee bracing and/or weight loss.   This patient has experienced inadequate response or has a contraindication to intra articular steroid injections for at least 3 months.   This patient is not scheduled to have a total knee replacement within 6 months of starting treatment with viscosupplementation.  Follow-Up Instructions: Return if symptoms worsen or fail to improve.   Orders:  No orders of the defined types were placed in this encounter.  Meds ordered this encounter  Medications  . methocarbamol (ROBAXIN) 500 MG tablet    Sig: Take 1 tablet (500 mg total) by mouth every 6 (six) hours as needed for muscle spasms.    Dispense:  30 tablet    Refill:  2      Procedures: No procedures performed   Clinical Data: No  additional findings.   Subjective: Chief Complaint  Patient presents with  . Right Knee - Pain    LAST INJ GIVEN 07/21/2018.  Marland Kitchen Left Thigh - Pain    Jenny Bradford is a 68 year old female who is well-known to me comes in for evaluation of bilateral knee pain due to her osteoarthritis and left thigh and hip pain.  She feels a cramping sensation in her thigh she is transitioning from sit to stand.  She does PT regularly for her neck and upper body.  She denies any injuries.  She has been taking Tylenol and ibuprofen and tramadol for the pain.  Denies any radicular symptoms.  Denies any groin pain.  Denies any swelling in her knees.   Review of Systems  Constitutional: Negative.   HENT: Negative.   Eyes: Negative.   Respiratory: Negative.   Cardiovascular: Negative.   Endocrine: Negative.   Musculoskeletal: Negative.   Neurological: Negative.   Hematological: Negative.   Psychiatric/Behavioral: Negative.   All other systems reviewed and are negative.    Objective: Vital Signs: There were no vitals taken for this visit.  Physical Exam Vitals signs and nursing note reviewed.  Constitutional:      Appearance: She is well-developed.  Pulmonary:     Effort: Pulmonary effort is normal.  Skin:    General: Skin is warm.     Capillary Refill: Capillary refill takes less than  2 seconds.  Neurological:     Mental Status: She is alert and oriented to person, place, and time.  Psychiatric:        Behavior: Behavior normal.        Thought Content: Thought content normal.        Judgment: Judgment normal.     Ortho Exam Bilateral knee exam shows no joint effusion.  Normal range of motion.  Collaterals and cruciates are stable.  Left hip exam shows tenderness along the trochanteric bursa.  No palpable crepitus.  No groin pain with range of motion of the hip. Specialty Comments:  No specialty comments available.  Imaging: No results found.   PMFS History: Patient Active Problem  List   Diagnosis Date Noted  . Palpitations 09/04/2017  . Family history of coronary artery disease in father 09/04/2017  . Paraesophageal hernia 05/13/2017  . Neck pain 01/03/2017  . Large hiatal hernia 12/28/2016  . Multiple pulmonary nodules determined by computed tomography of lung 12/27/2016  . Arthritis of carpometacarpal [facility]) joint of left thumb 05/28/2016  . OSA (obstructive sleep apnea) 10/05/2012  . HTN (hypertension) 10/05/2012  . Hyperlipidemia 10/05/2012  . Migraine headache 10/05/2012   Past Medical History:  Diagnosis Date  . AAA (abdominal aortic aneurysm) ([place]) 08/2014   scanning every 2 years  . Arthritis    oa  . Complication of anesthesia    slow to awaken in past  . Family history of anesthesia complication    slow to awaken  . GERD (gastroesophageal reflux disease)   . Grade II diastolic dysfunction [id number]   Noted on ECHO  . H/O hiatal hernia   . Headache(784.0)    migraines (rare)  . Hemorrhoids   . Hyperlipidemia   . Hypertension 04/01/11   ECHO-EF>55% NUC STRESS TEST- 05/01/12  . Hypothyroidism   . Mild aortic valve regurgitation 04/2017   Noted on ECHO  . Mitral valve regurgitation 04/2017   Mild: Noted on ECHO  . Pulmonary nodules 2018   scanning every 6 months  . Sleep apnea 05/01/12 & 05/29/12   SLEEP STUDY-[facility], NO CPAP USED SINCE MAY 2015 Uses oral device    Family History  Problem Relation Age of Onset  . Breast cancer Mother   . Ovarian cancer Mother   . Heart attack Father   . Heart disease Father   . Hypertension Brother   . Hyperlipidemia Maternal Grandmother   . Hypertension Maternal Grandmother     Past Surgical History:  Procedure Laterality Date  . ANKLE SURGERY Right 1980  . CARPOMETACARPEL SUSPENSION PLASTY Right 12/14/2014   Procedure: RIGHT THUMB CARPOMETACARPAL ([place]) ARTHROPLASTY;  Surgeon: Leandrew Koyanagi, MD;  Location: [facility];  Service: Orthopedics;  Laterality: Right;  .  CARPOMETACARPEL SUSPENSION PLASTY Left 03/19/2017   Procedure: Left Thumb Ligament Reconstruction and Tendon Interposition;  Surgeon: Leandrew Koyanagi, MD;  Location: [facility];  Service: Orthopedics;  Laterality: Left;  . COLONOSCOPY WITH PROPOFOL N/A 12/07/2013   Procedure: COLONOSCOPY WITH PROPOFOL;  Surgeon: Garlan Fair, MD;  Location: [facility];  Service: Endoscopy;  Laterality: N/A;  . CYSTECTOMY  1975  . FOOT SURGERY Right 9.23.14  . FOOT SURGERY Left 10.01.13  . [place]  2005  . Miscarriage  23  . TONSILLECTOMY  1964   Social History   Occupational History  . Not on file  Tobacco Use  . Smoking status: Never Smoker  . Smokeless tobacco: Never Used  Substance and Sexual Activity  . Alcohol use: No    Alcohol/week: 0.0 standard drinks  . Drug use: No  . Sexual activity: Not on file

## 2019-01-19 NOTE — Addendum Note (Signed)
Addended by: Precious Bard on: 01/19/2019 01:14 PM   Modules accepted: Orders

## 2019-01-19 NOTE — Telephone Encounter (Signed)
Please submit for Bil Synvisc Gel injections.- Dr. Erlinda Hong

## 2019-01-20 ENCOUNTER — Ambulatory Visit
Admission: RE | Admit: 2019-01-20 | Discharge: 2019-01-20 | Disposition: A | Discharge status: Home / Self Care | Payer: Medicare Other | Source: Ambulatory Visit | Attending: Obstetrics & Gynecology | Admitting: Obstetrics & Gynecology

## 2019-01-20 DIAGNOSIS — N83209 Unspecified ovarian cyst, unspecified side: Secondary | ICD-10-CM

## 2019-01-20 DIAGNOSIS — N816 Rectocele: Secondary | ICD-10-CM | POA: Diagnosis not present

## 2019-01-20 DIAGNOSIS — K5900 Constipation, unspecified: Secondary | ICD-10-CM | POA: Diagnosis not present

## 2019-01-20 DIAGNOSIS — N83202 Unspecified ovarian cyst, left side: Secondary | ICD-10-CM | POA: Diagnosis not present

## 2019-01-20 MED FILL — ALPRAZolam 0.25 MG TABS: 0.25 | 30 days supply | Qty: 30 | Fill #1

## 2019-01-20 MED FILL — ESTRADIOL 0.1 MG/GM CRM: 0.1 | 90 days supply | Qty: 43 | Fill #0

## 2019-01-20 MED FILL — LINZESS 72 MCG CAPSULE: 72 | 30 days supply | Qty: 30 | Fill #1

## 2019-01-21 DIAGNOSIS — M79605 Pain in left leg: Secondary | ICD-10-CM | POA: Diagnosis not present

## 2019-01-21 DIAGNOSIS — M542 Cervicalgia: Secondary | ICD-10-CM | POA: Diagnosis not present

## 2019-01-21 DIAGNOSIS — M545 Low back pain: Secondary | ICD-10-CM | POA: Diagnosis not present

## 2019-01-21 LAB — RHEUMATOID FACTOR: Rheumatoid fact SerPl-aCnc: <14 IU/mL (ref <14)

## 2019-01-21 LAB — URIC ACID: Uric Acid, Serum: 5.1 mg/dL (ref 2.5–7.0)

## 2019-01-21 LAB — ANA: Anti Nuclear Antibody (ANA): POSITIVE — AB

## 2019-01-21 LAB — SEDIMENTATION RATE: Sed Rate: 6 mm/h (ref 0–30)

## 2019-01-21 LAB — ANTI-NUCLEAR AB-TITER (ANA TITER)
ANA TITER: 1:40 {titer} — ABNORMAL HIGH
ANA Titer 1: 1:320 {titer} — ABNORMAL HIGH

## 2019-01-21 NOTE — Progress Notes (Signed)
Multiple labs are positive.  Do you mind calling the patient and letting her know please.  She needs a referral to Dr. Estanislado Pandy.  Thanks.

## 2019-01-21 NOTE — Telephone Encounter (Signed)
Ok to schedule for bilateral knee injections- Gel-One.  Will you please call her and tell her?  And schedule whenever she wants?  Thanks.  Buy and bill ok, insurance covers all costs.

## 2019-01-22 ENCOUNTER — Telehealth: Payer: Self-pay | Admitting: Orthopaedic Surgery

## 2019-01-22 DIAGNOSIS — R768 Other specified abnormal immunological findings in serum: Secondary | ICD-10-CM

## 2019-01-22 NOTE — Telephone Encounter (Signed)
I called patient and discussed. Per message attached to labs, patient is to be referred to Dr. Estanislado Pandy.  Referral entered. Patient requests copy of lab results be mailed to home address. Mailed.

## 2019-01-22 NOTE — Telephone Encounter (Signed)
Patient would like for you to call her regarding her lab results.  [phone].  Thank you.

## 2019-01-25 DIAGNOSIS — M542 Cervicalgia: Secondary | ICD-10-CM | POA: Diagnosis not present

## 2019-01-25 DIAGNOSIS — M79605 Pain in left leg: Secondary | ICD-10-CM | POA: Diagnosis not present

## 2019-01-25 DIAGNOSIS — M545 Low back pain: Secondary | ICD-10-CM | POA: Diagnosis not present

## 2019-01-26 ENCOUNTER — Ambulatory Visit (INDEPENDENT_AMBULATORY_CARE_PROVIDER_SITE_OTHER): Payer: Medicare Other | Admitting: Orthopaedic Surgery

## 2019-01-26 DIAGNOSIS — M1712 Unilateral primary osteoarthritis, left knee: Secondary | ICD-10-CM | POA: Diagnosis not present

## 2019-01-26 DIAGNOSIS — M1711 Unilateral primary osteoarthritis, right knee: Secondary | ICD-10-CM | POA: Diagnosis not present

## 2019-01-26 MED ORDER — SODIUM HYALURONATE 60 MG/3ML IX PRSY
60.0000 mg | PREFILLED_SYRINGE | INTRA_ARTICULAR | Status: AC | PRN
  Administered 2019-01-26: 60 mg via INTRA_ARTICULAR

## 2019-01-26 MED ORDER — SODIUM HYALURONATE 60 MG/3ML IX PRSY
60.0000 mg | PREFILLED_SYRINGE | INTRA_ARTICULAR | Status: AC | PRN
  Administered 2019-01-26: 16:00:00 60 mg via INTRA_ARTICULAR

## 2019-01-26 NOTE — Progress Notes (Signed)
   Procedure Note  Patient: Jenny Bradford             Date of Birth: 24-May-1950           MRN: [id number]             Visit Date: 01/26/2019  Procedures: Visit Diagnoses:  1. Primary osteoarthritis of left knee   2. Primary osteoarthritis of right knee     Large Joint Inj: bilateral knee on 01/26/2019 3:32 PM Indications: pain Details: 22 G needle  Arthrogram: No  Medications (Right): 60 mg Sodium Hyaluronate 60 MG/3ML Medications (Left): 60 mg Sodium Hyaluronate 60 MG/3ML Outcome: tolerated well, no immediate complications Patient was prepped and draped in the usual sterile fashion.

## 2019-01-28 DIAGNOSIS — M79605 Pain in left leg: Secondary | ICD-10-CM | POA: Diagnosis not present

## 2019-01-28 DIAGNOSIS — M545 Low back pain: Secondary | ICD-10-CM | POA: Diagnosis not present

## 2019-01-28 DIAGNOSIS — M542 Cervicalgia: Secondary | ICD-10-CM | POA: Diagnosis not present

## 2019-02-01 DIAGNOSIS — M79605 Pain in left leg: Secondary | ICD-10-CM | POA: Diagnosis not present

## 2019-02-01 DIAGNOSIS — M542 Cervicalgia: Secondary | ICD-10-CM | POA: Diagnosis not present

## 2019-02-01 DIAGNOSIS — M545 Low back pain: Secondary | ICD-10-CM | POA: Diagnosis not present

## 2019-02-04 IMAGING — RF DG UGI W/ GASTROGRAFIN
10 of 16 series · 13 of 24 positions shown · IV contrast (isovue)
Comparison: Upper GI 04/03/2017

CLINICAL DATA: Postop day 1 hiatal hernia repair

EXAM:
WATER SOLUBLE UPPER GI SERIES
TECHNIQUE: Single-column upper GI series was performed using water soluble
contrast.
CONTRAST:  Isovue 370 orally

[Series 2: fluoro_barium singleshot_bw · 0.19mm/px · 1 of 1 slices shown (1 of 6)]
[im 1/1]
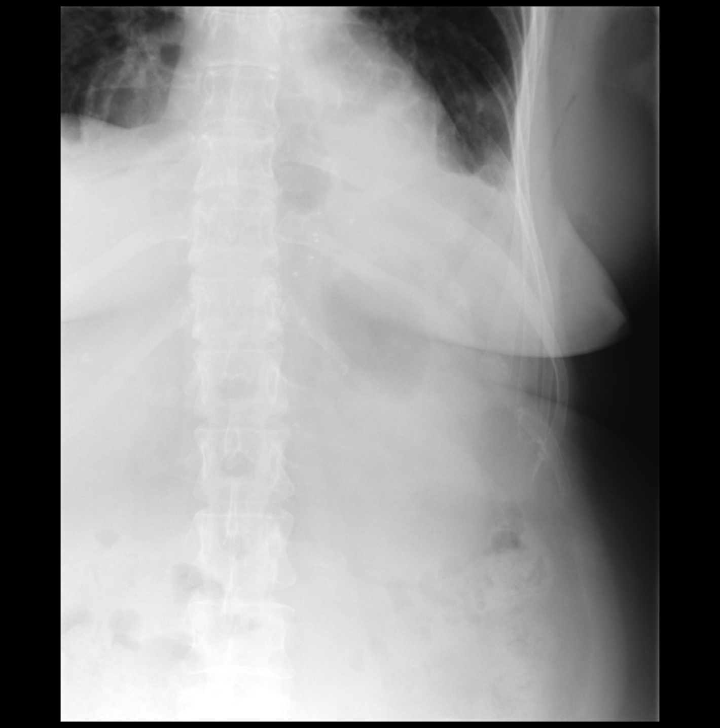

[Series 3: cp_standard · 0.42mm/px · 2 of 119 frames shown (1 of 4)]
[frame 60/119]
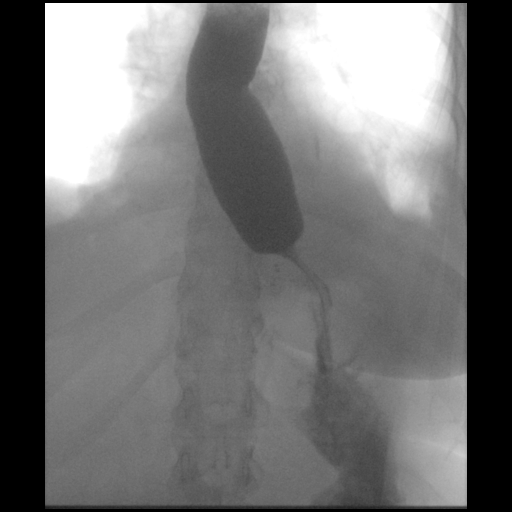
[frame 115/119]
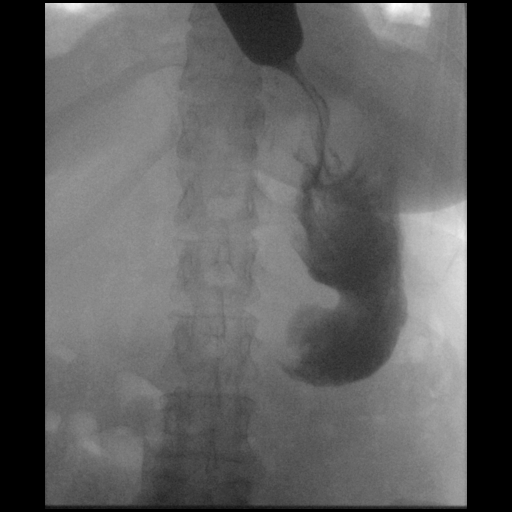

[Series 4: cp_standard · 0.42mm/px · 2 of 120 frames shown (2 of 4)]
[frame 19/120]
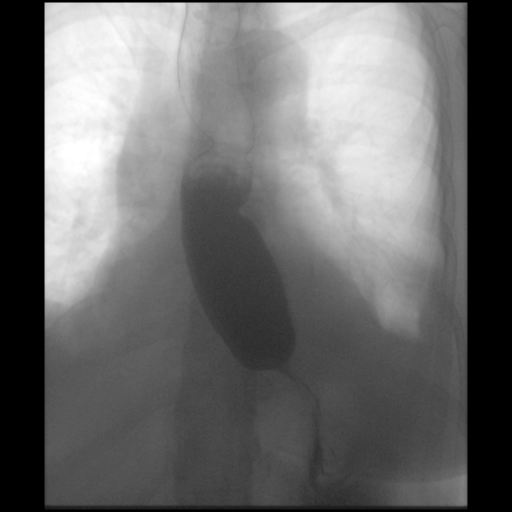
[frame 103/120]
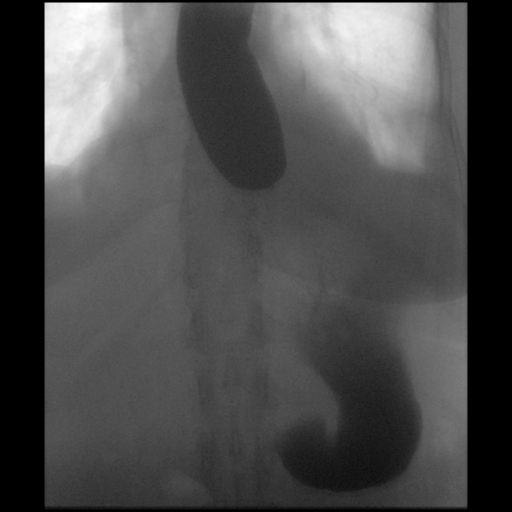

[Series 6: fluoro_barium singleshot_bw · 0.22mm/px · 1 of 1 slices shown (2 of 6)]
[im 1/1]
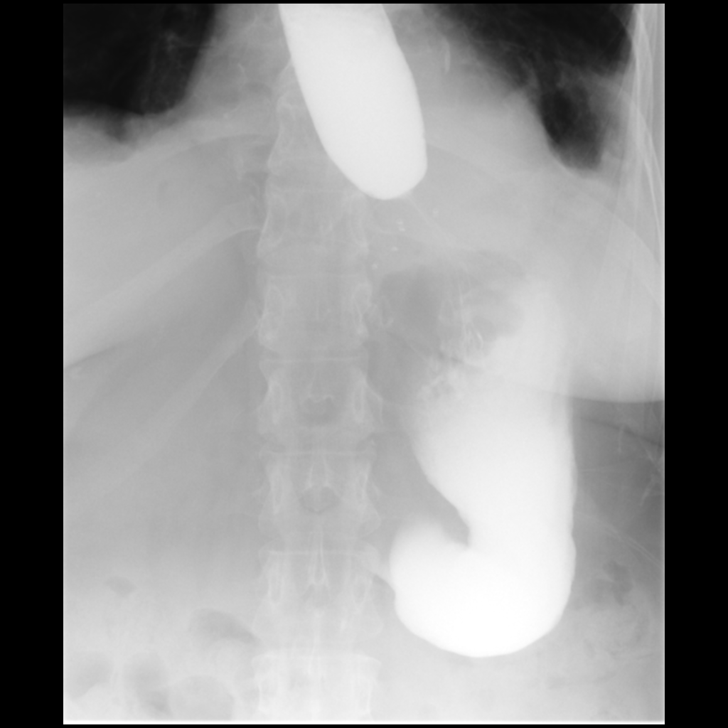

[Series 7: cp_standard · 0.45mm/px · 2 of 165 frames shown (3 of 4)]
[frame 141/165]
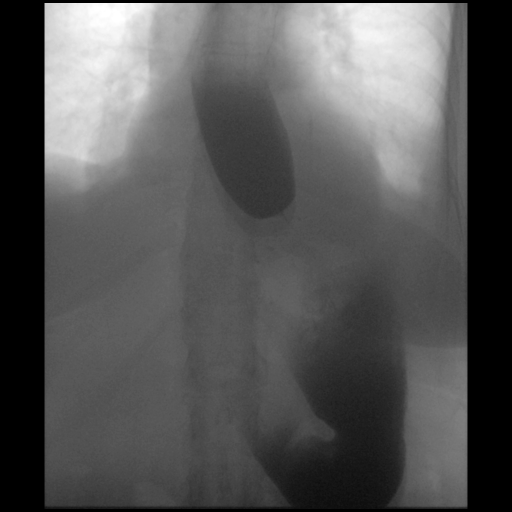
[frame 152/165]
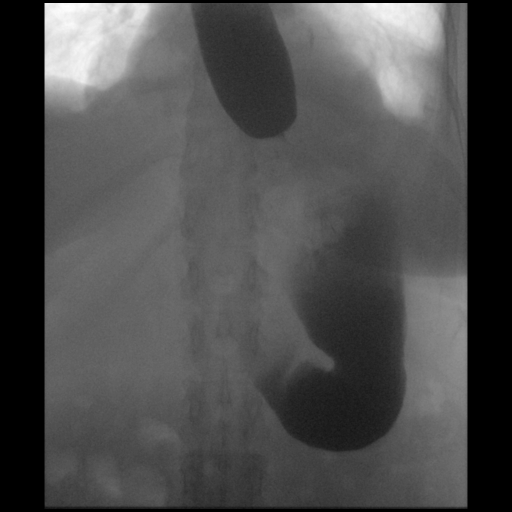

[Series 9: fluoro_barium singleshot_bw · 0.21mm/px · 1 of 1 slices shown (3 of 6)]
[im 1/1]
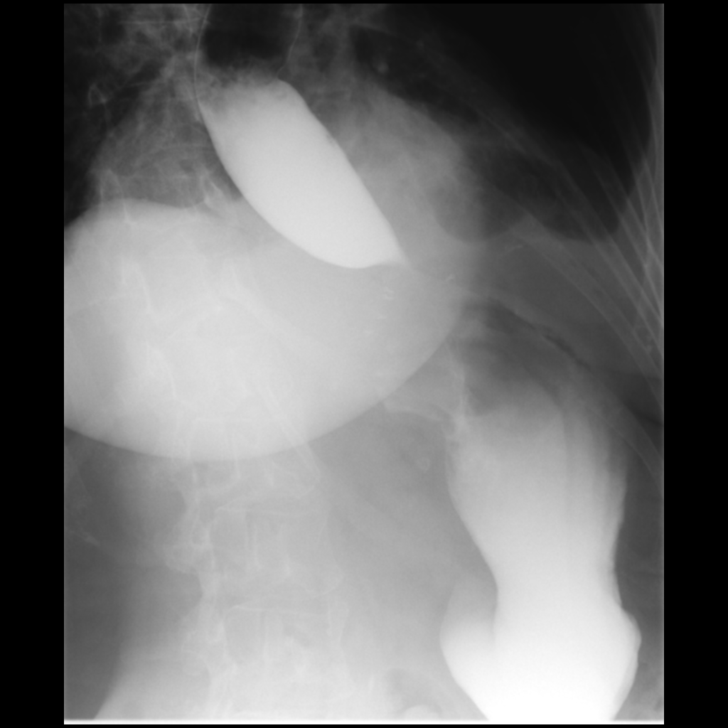

[Series 11: fluoro_barium singleshot_bw · 0.21mm/px · 1 of 1 slices shown (4 of 6)]
[im 1/1]
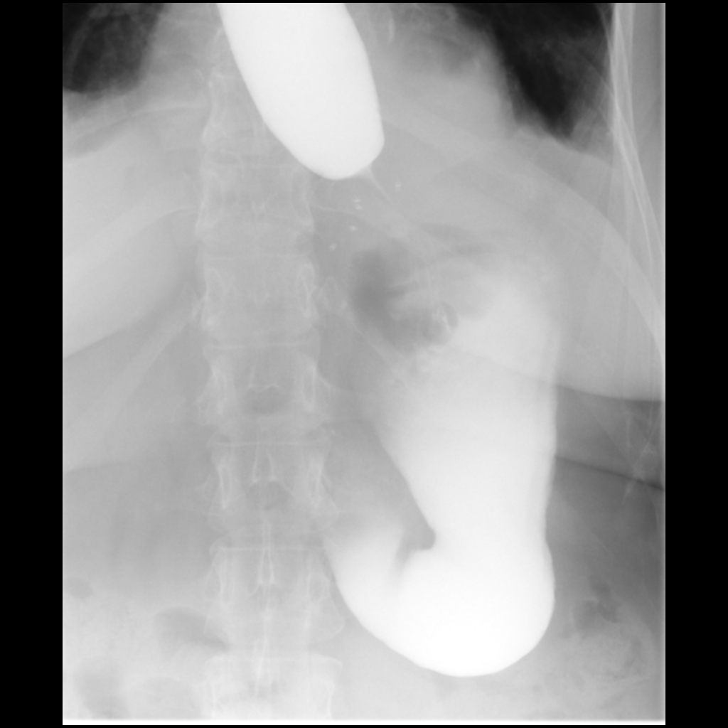

[Series 13: fluoro_barium singleshot_bw · 0.20mm/px · 1 of 1 slices shown (5 of 6)]
[im 1/1]
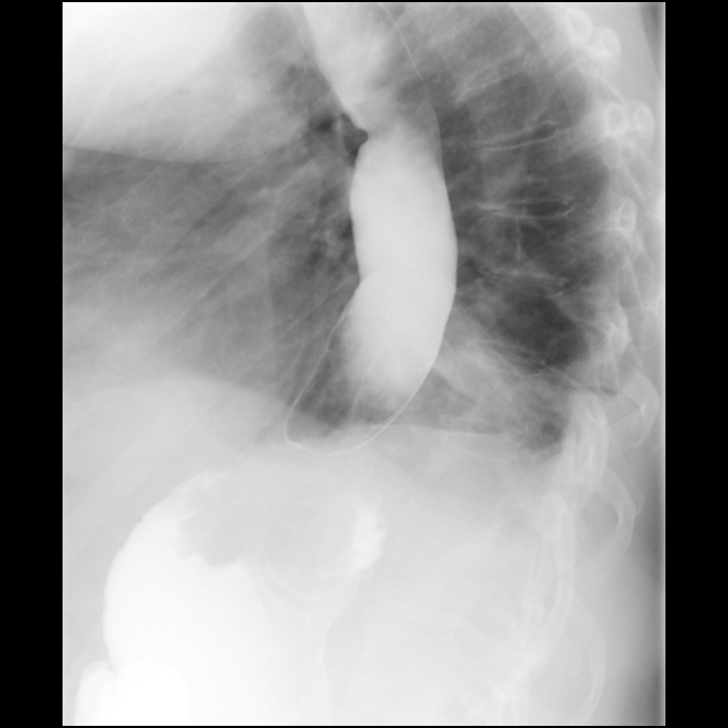

[Series 15: fluoro_barium singleshot_bw · 0.20mm/px · 1 of 1 slices shown (6 of 6)]
[im 1/1]
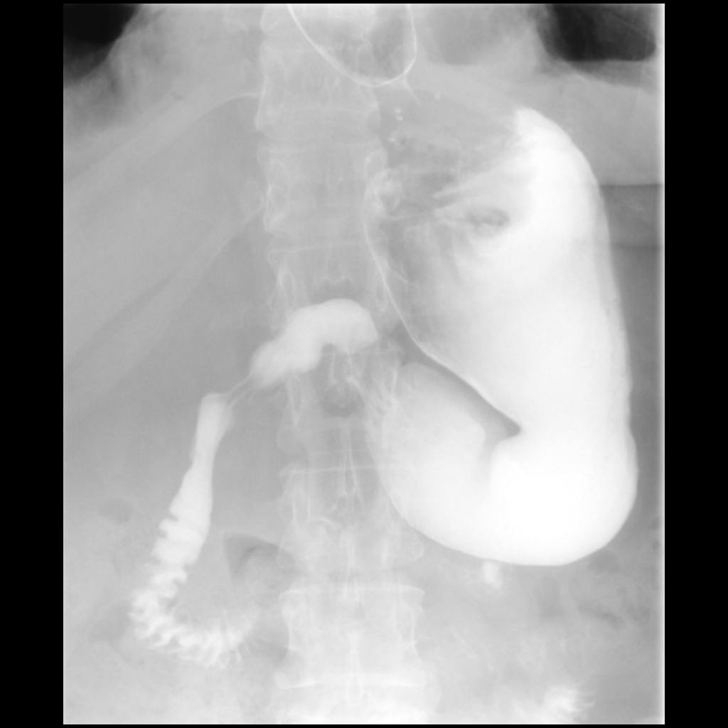

[Series 17: cp_standard · 0.20mm/px · 1 of 1 slices shown (4 of 4)]
[im 1/1]
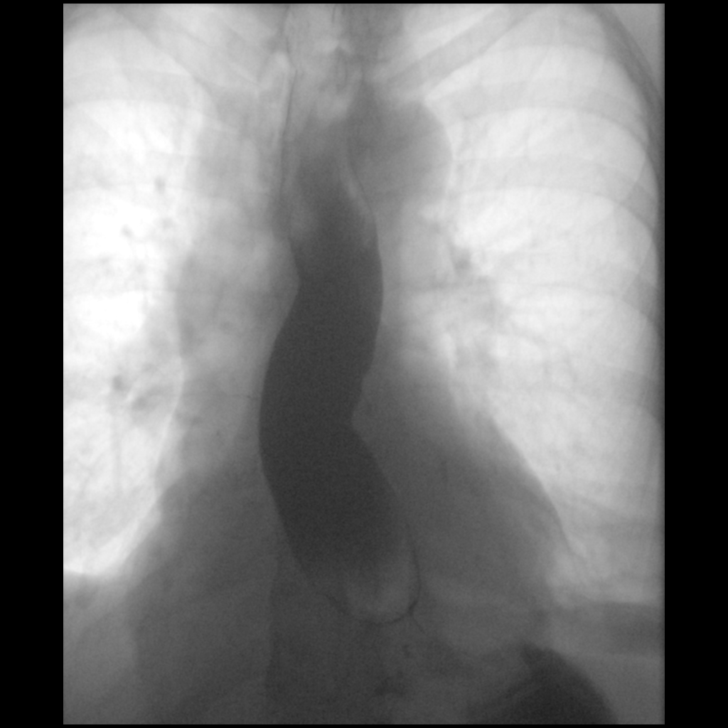

[13 of 24 positions shown; findings below may reference images not displayed]

FLUOROSCOPY TIME:  Fluoroscopy Time:  1 minute 42 second

Radiation Exposure Index (if provided by the fluoroscopic device):

Number of Acquired Spot Images: 0
FINDINGS: Postop reduction of large hiatal hernia. Stomach is now below the
hemidiaphragm.

The esophagus is diffusely dilated down to the GE junction where
there is a severe narrowing. Small amount of contrast passed through
this area into the stomach. During several episodes of fluoroscopy,
contrast did not flow through the area of narrowing. The esophagus
remained filled with contrast and dilated throughout the course of
the study.

Negative for leak

Gastric mucosa normal. Normal emptying of the stomach into the
duodenum.
IMPRESSION: Postop reduction of large hiatal hernia. Stomach is now below the
diaphragm. Negative for leak.

Severe narrowing at the GE junction due to postop edema. Small
amount of contrast passed through this area into the stomach, with
distal esophagus remaining dilated and filled with contrast
throughout the course of the study.

## 2019-02-08 DIAGNOSIS — M545 Low back pain: Secondary | ICD-10-CM | POA: Diagnosis not present

## 2019-02-08 DIAGNOSIS — M542 Cervicalgia: Secondary | ICD-10-CM | POA: Diagnosis not present

## 2019-02-08 DIAGNOSIS — M79605 Pain in left leg: Secondary | ICD-10-CM | POA: Diagnosis not present

## 2019-02-11 DIAGNOSIS — M542 Cervicalgia: Secondary | ICD-10-CM | POA: Diagnosis not present

## 2019-02-11 DIAGNOSIS — M545 Low back pain: Secondary | ICD-10-CM | POA: Diagnosis not present

## 2019-02-11 DIAGNOSIS — M79605 Pain in left leg: Secondary | ICD-10-CM | POA: Diagnosis not present

## 2019-02-15 DIAGNOSIS — Z01411 Encounter for gynecological examination (general) (routine) with abnormal findings: Secondary | ICD-10-CM | POA: Diagnosis not present

## 2019-02-15 DIAGNOSIS — M542 Cervicalgia: Secondary | ICD-10-CM | POA: Diagnosis not present

## 2019-02-15 DIAGNOSIS — M79605 Pain in left leg: Secondary | ICD-10-CM | POA: Diagnosis not present

## 2019-02-15 DIAGNOSIS — R350 Frequency of micturition: Secondary | ICD-10-CM | POA: Diagnosis not present

## 2019-02-15 DIAGNOSIS — M545 Low back pain: Secondary | ICD-10-CM | POA: Diagnosis not present

## 2019-02-15 DIAGNOSIS — Z6829 Body mass index (BMI) 29.0-29.9, adult: Secondary | ICD-10-CM | POA: Diagnosis not present

## 2019-02-18 MED FILL — ALPRAZolam 0.25 MG TABS: 0.25 | 30 days supply | Qty: 30 | Fill #1

## 2019-02-18 MED FILL — LEVOTHYROXINE 50 MCG TABLET: 50 | 90 days supply | Qty: 90 | Fill #0

## 2019-02-18 MED FILL — ESCITALOPRAM 20 MG TABLET: 20 | 90 days supply | Qty: 90 | Fill #1

## 2019-02-18 MED FILL — LINZESS 72 MCG CAPSULE: 72 | 30 days supply | Qty: 30 | Fill #1

## 2019-02-18 MED FILL — ESTRADIOL 0.1 MG/GM CRM: 0.1 | 90 days supply | Qty: 43 | Fill #0

## 2019-02-22 ENCOUNTER — Encounter: Payer: Self-pay | Admitting: Orthopaedic Surgery

## 2019-02-22 ENCOUNTER — Other Ambulatory Visit: Payer: Self-pay | Admitting: Orthopaedic Surgery

## 2019-02-22 MED ORDER — PREDNISONE 10 MG (21) PO TBPK: ORAL_TABLET | ORAL | 0 refills | Status: DC

## 2019-02-22 MED FILL — METHOCARBAMOL 500 MG TABS: 500 | 7 days supply | Qty: 30 | Fill #1

## 2019-02-22 MED FILL — predniSONE 10 MG (21) TBPK: 10 | 6 days supply | Qty: 21 | Fill #0

## 2019-03-02 ENCOUNTER — Ambulatory Visit (INDEPENDENT_AMBULATORY_CARE_PROVIDER_SITE_OTHER): Payer: Medicare Other | Admitting: Cardiovascular Disease

## 2019-03-02 ENCOUNTER — Other Ambulatory Visit: Payer: Self-pay

## 2019-03-02 VITALS — BP 134/80 | HR 65 | Temp 97.7°F | Ht 63.0 in | Wt 169.9 lb

## 2019-03-02 DIAGNOSIS — I519 Heart disease, unspecified: Secondary | ICD-10-CM | POA: Diagnosis not present

## 2019-03-02 DIAGNOSIS — E039 Hypothyroidism, unspecified: Secondary | ICD-10-CM | POA: Diagnosis not present

## 2019-03-02 DIAGNOSIS — E7800 Pure hypercholesterolemia, unspecified: Secondary | ICD-10-CM | POA: Diagnosis not present

## 2019-03-02 DIAGNOSIS — K449 Diaphragmatic hernia without obstruction or gangrene: Secondary | ICD-10-CM

## 2019-03-02 DIAGNOSIS — I10 Essential (primary) hypertension: Secondary | ICD-10-CM | POA: Diagnosis not present

## 2019-03-02 DIAGNOSIS — G4733 Obstructive sleep apnea (adult) (pediatric): Secondary | ICD-10-CM

## 2019-03-02 DIAGNOSIS — I5189 Other ill-defined heart diseases: Secondary | ICD-10-CM

## 2019-03-02 NOTE — Progress Notes (Signed)
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  I had cared for her father Jenny Bradford for many years who ultimately died on Jan 26, 2014 at [facility].  She established cardiology care with me in February 2019.   She presents for 56-monthfollow-up evaluation.  Ms. MHarklesshas a history of hypertension for many years.  Most recently, she has been on losartan 75 mg daily in addition to HCTZ 25 mg.  She also has a history of hypothyroidism and has been on levothyroxine at 50 g.  There is a history of hyperlipidemia and she has been on pravastatin 40 mg.  Her father had a history of significant abdominal aortic aneurysm.  She underwent abdominal aortic aneurysm screening and there was no significant aortic dilatation and instructed with a maximum aortic dimension at 2.3 cm.  She has a history of obstructive sleep apnea which was diagnosed in 2014 with an AHI of 14 per hour and RDI of 16.5 per hour.  She had significant oxygen desaturation to a nadir of 81% with rems sleep and had loud snoring.  Apparently should use CPAP for a short while but ultimately became intolerant was ultimately transitioned to an oral appliance for which she is followed by Dr. MOneal Bradford   When I saw her in February 2019 she had begun to notice that despite taking medication for her blood pressure, her blood pressure elevated.  She was walking 4 days per week for 30 minutes at a time.  She denied any exertionally precipitation of chest pain.  She was unaware of any significant valvular abnormality, although her mother has aortic stenosis.  She also has a very large hiatal hernia and has seen Dr. OPaulita Fujitaand was told that in the future she may require surgery.  Shehe underwent surgery for  paraesophageal hernia repair with a Nissen fundoplication in March 26948  She also has had progressive difficulty with osteoarthritis involving her neck, back, hands, feet, and feet.  He has not been able to exercise as regularly.  She is no longer working since she is unable to stand up the entire day.  She now sees Jenny Hindersfor primary care.  She has been caring for her husband who is permanently disabled since age 68    When I last saw her on December 08, 2017 as result of the generic impurities I had recommended she change from losartan 100 mg daily to olmesartan 40 mg.  Her lipid studies were not optimal on pravastatin and I suggested discontinuance of this and in its place started atorvastatin 40 mg.  An echo Doppler study in February 2019 showed an EF of 60 to 65% with grade 2 diastolic dysfunction, mild AR and mild MR.  An event monitor had shown sinus rhythm with low average heart rate at 50 and high average heart rate at 70 bpm.  She had several atrial couplets and one atrial triplet.  There were no episodes of atrial fibrillation.  There was no ventricular ectopy or pauses.  I saw her in March 2020 after she had called the office and wished to be seen sooner than her scheduled appointment due to progressive fatigue and particularly shortness of breath particularly while  climbing steps.  She had been self adjusting some of her medications and was no longer on olmesartan but has been on valsartan 160 mg.  However she felt better when she cuts this in half and had been taking 80 mg twice a day.  Denied any chest pain and was walking 1-1 and half miles per day.  She was only using CPAP intermittently and had adamantly using a customized oral appliance.   She was evaluated in a telemedicine visit in June 2020.  At that time she was  on candesartan for ARB therapy.  Her dose was further titrated to 60 mg in the morning and 8 mg in the evening.  She was on atorvastatin 40 mg on Monday Wednesday and  Friday for hyperlipidemia.  She was using an oral appliance for obstructive sleep apnea from Dr. Oneal Bradford.  An echo Doppler study from August 13, 2018 showed normal systolic function with EF at 55 to 60%.  There was mild aortic sclerosis with mild AR.  The aortic root and ascending aorta and aortic arch were normal in dimensions.  I last saw her on December 07, 2018 which time she was complaining of some muscle aching.  As result she stopped taking atorvastatin several weeks prior to that evaluation.  Previously she felt she tolerated pravastatin better.  She had noticed fatigue and at times low blood pressures.  She will be undergoing a hysterectomy in late January.  I reviewed laboratory from November 23, 2018.  BUN 13 creatinine 0.94.  LFTs normal.  Stable CBC.  Compared to March 2020, cholesterol increased from 140 to162.  LDL increased from 57 while on atorvastatin daily to 84 on her reduced dose.  During that evaluation, recommended institution of Zetia 10 mg.  2 weeks thereafter I recommended resumption of pravastatin which she had tolerated better than atorvastatin.  I also checked a CPK and erythrocyte sedimentation rate which were normal arguing against myopathy or rhabdomyolysis.  She was using her oral appliance for obstructive sleep apnea.  Over the last several months, she denies any episodes of chest tightness or pressure.  Is wondering whether or not to go through her elective neurectomy and pelvic floor reconstruction January with the spike in COVID-19 this is to be done by a urogynecologist at [facility].  She admits to fatigue.  She has not had recent laboratory on Zetia and and pravastatin which she has been taking 20 mg.  She continues to be on candesartan 16 mg, spironolactone 12.5 mg for hypertension and is on levothyroxine 50 mcg for hypothyroidism.  She presents for reevaluation.   Past Medical History:  Diagnosis Date  . AAA (abdominal aortic aneurysm) ([place]) 08/2014    scanning every 2 years  . Arthritis    oa  . Complication of anesthesia    slow to awaken in past  . Family history of anesthesia complication    slow to awaken  . GERD (gastroesophageal reflux disease)   . Grade II diastolic dysfunction 23/5573   Noted on ECHO  . H/O hiatal hernia   . Headache(784.0)    migraines (rare)  . Hemorrhoids   . Hyperlipidemia   . Hypertension 04/01/11   ECHO-EF>55% NUC STRESS TEST- 05/01/12  . Hypothyroidism   . Mild aortic valve regurgitation 04/2017   Noted on ECHO  . Mitral valve regurgitation 04/2017   Mild: Noted on ECHO  . Pulmonary nodules 2018   scanning every 6 months  . Sleep apnea 05/01/12 & 05/29/12  SLEEP STUDY-[facility], NO CPAP USED SINCE MAY 2015 Uses oral device    Past Surgical History:  Procedure Laterality Date  . ANKLE SURGERY Right 1980  . CARPOMETACARPEL SUSPENSION PLASTY Right 12/14/2014   Procedure: RIGHT THUMB CARPOMETACARPAL ([place] ARTHROPLASTY;  Surgeon: Leandrew Koyanagi, MD;  Location: [facility];  Service: Orthopedics;  Laterality: Right;  . CARPOMETACARPEL SUSPENSION PLASTY Left 03/19/2017   Procedure: Left Thumb Ligament Reconstruction and Tendon Interposition;  Surgeon: Leandrew Koyanagi, MD;  Location: [facility];  Service: Orthopedics;  Laterality: Left;  . COLONOSCOPY WITH PROPOFOL N/A 12/07/2013   Procedure: COLONOSCOPY WITH PROPOFOL;  Surgeon: Garlan Fair, MD;  Location: [facility];  Service: Endoscopy;  Laterality: N/A;  . CYSTECTOMY  1975  . FOOT SURGERY Right 9.23.14  . FOOT SURGERY Left 10.01.13  . [place]  2005  . Miscarriage  45  . TONSILLECTOMY  1964    Current Medications: Outpatient Medications Prior to Visit  Medication Sig Dispense Refill  . acetaminophen (TYLENOL) 500 MG tablet Take 500 mg by mouth 2 (two) times daily.    Marland Kitchen ALPRAZolam (XANAX) 0.25 MG tablet Take 0.25 mg by mouth at bedtime as needed for sleep.     .  butalbital-acetaminophen-caffeine (FIORICET, ESGIC) 50-325-40 MG per tablet Take 1 tablet by mouth 2 (two) times daily as needed for headache.    . candesartan (ATACAND) 16 MG tablet Take 1 tablet (16 mg total) by mouth daily. 90 tablet 1  . Cholecalciferol (VITAMIN D) 2000 units tablet Take 2,000 Units by mouth daily.    . diclofenac sodium (VOLTAREN) 1 % GEL Apply 2 g topically 4 (four) times daily. 1 Tube 5  . Docusate Sodium 100 MG capsule Take 200 mg by mouth daily as needed for mild constipation. Takes 300 mg  as needed    . escitalopram (LEXAPRO) 20 MG tablet Take 10-20 mg by mouth every morning. Hot flashes    . estradiol (ESTRACE) 0.1 MG/GM vaginal cream Place 2 g vaginally 2 (two) times a week. USE TWICE WEEKLY    . ezetimibe (ZETIA) 10 MG tablet Take 1 tablet (10 mg total) by mouth daily. 90 tablet 3  . levothyroxine (SYNTHROID, LEVOTHROID) 50 MCG tablet Take 50 mcg by mouth daily before breakfast.    . linaclotide (LINZESS) 72 MCG capsule Linzess 72 mcg capsule    . methocarbamol (ROBAXIN) 500 MG tablet Take 1 tablet (500 mg total) by mouth every 6 (six) hours as needed for muscle spasms. 30 tablet 2  . pravastatin (PRAVACHOL) 40 MG tablet Take 0.5 tablets (20 mg total) by mouth every evening. 90 tablet 1  . simethicone (MYLICON) 80 MG chewable tablet Chew 1 tablet (80 mg total) by mouth every 6 (six) hours as needed for flatulence (bloating). 30 tablet 0  . spironolactone (ALDACTONE) 25 MG tablet Take 0.5 tablets (12.5 mg total) by mouth daily. May use PRN for swelling as well. 90 tablet 1  . SUMAtriptan (IMITREX) 50 MG tablet Take 50 mg by mouth every 2 (two) hours as needed for migraine.     . predniSONE (STERAPRED UNI-PAK 21 TAB) 10 MG (21) TBPK tablet Take as directed 21 tablet 0   No facility-administered medications prior to visit.     Allergies:   Midol [aspirin-cinnamedrine-caffeine], Olmesartan, Talwin [pentazocine], Bisoprolol, and Naproxen   Social History    Socioeconomic History  . Marital status: Married    Spouse name: Not on file  . Number of  children: Not on file  . Years of education: Not on file  . Highest education level: Not on file  Occupational History  . Not on file  Tobacco Use  . Smoking status: Never Smoker  . Smokeless tobacco: Never Used  Substance and Sexual Activity  . Alcohol use: No    Alcohol/week: 0.0 standard drinks  . Drug use: No  . Sexual activity: Not on file  Other Topics Concern  . Not on file  Social History Narrative  . Not on file   Social Determinants of Health   Financial Resource Strain:   . Difficulty of Paying Living Expenses: Not on file  Food Insecurity:   . Worried About Charity fundraiser in the Last Year: Not on file  . Ran Out of Food in the Last Year: Not on file  Transportation Needs:   . Lack of Transportation (Medical): Not on file  . Lack of Transportation (Non-Medical): Not on file  Physical Activity:   . Days of Exercise per Week: Not on file  . Minutes of Exercise per Session: Not on file  Stress:   . Feeling of Stress : Not on file  Social Connections:   . Frequency of Communication with Friends and Family: Not on file  . Frequency of Social Gatherings with Friends and Family: Not on file  . Attends Religious Services: Not on file  . Active Member of Clubs or Organizations: Not on file  . Attends Archivist Meetings: Not on file  . 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  No tobacco history.  Family History:  The patient's family history includes Breast cancer in her mother; Heart attack in her father; Heart disease in her father; Hyperlipidemia in her maternal grandmother; Hypertension in her brother and maternal grandmother; Ovarian cancer in her mother.   ROS General: Negative; No fevers, chills, or night sweats; increasing fatigue HEENT: Negative; No changes in vision or hearing, sinus  congestion, difficulty swallowing Pulmonary: Negative; No cough, wheezing, shortness of breath, hemoptysis Cardiovascular: See history of present illness, no chest pain, PND, orthopnea. GI: Positive for large hiatal hernia; is post Nissan fundoplication surgery GU: For elective hysterectomy and pelvic floor reconstruction Musculoskeletal: Negative; no myalgias, joint pain, or weakness Hematologic/Oncology: Negative; no easy bruising, bleeding Endocrine: Negative; no heat/cold intolerance; no diabetes Neuro: Negative; no changes in balance, headaches Skin: Negative; No rashes or skin lesions Psychiatric: Negative; No behavioral problems, depression Sleep: Positive for OSA, now with a customized oral appliance with mandibular advancement; No snoring, daytime sleepiness, hypersomnolence, bruxism, restless legs, hypnogognic hallucinations, no cataplexy Other comprehensive 14 point system review is negative.   PHYSICAL EXAM:   VS:  BP 134/80   Pulse 65   Temp 97.7 F (36.5 C)   Ht _0  (1.6 m)   Wt 169 lb 14.4 oz (77.1 kg)   SpO2 100%   BMI 30.10 kg/m     Repeat blood pressure by me was 112/76 supine and 104/74 standing  Wt Readings from Last 3 Encounters:  03/02/19 169 lb 14.4 oz (77.1 kg)  12/07/18 165 lb (74.8 kg)  09/07/18 164 lb (74.4 kg)    General: Alert, oriented, no distress.  Skin: normal turgor, no rashes, warm and dry HEENT: Normocephalic, atraumatic. Pupils equal round and reactive to light; sclera anicteric; extraocular muscles intact; Nose without nasal septal hypertrophy Mouth/Parynx benign; Mallinpatti scale 3 Neck: No  JVD, no carotid bruits; normal carotid upstroke Lungs: clear to ausculatation and percussion; no wheezing or rales Chest wall: without tenderness to palpitation Heart: PMI not displaced, RRR, s1 s2 normal, 1/6 systolic murmur, no diastolic murmur, no rubs, gallops, thrills, or heaves Abdomen: soft, nontender; no hepatosplenomehaly, BS+; abdominal  aorta nontender and not dilated by palpation. Back: no CVA tenderness Pulses 2+ Musculoskeletal: full range of motion, normal strength, no joint deformities Extremities: no clubbing cyanosis or edema, Homan's sign negative  Neurologic: grossly nonfocal; Cranial nerves grossly wnl Psychologic: Normal mood and affect   Studies/Labs Reviewed:   ECG (independently read by me): Normal sinus rhythm at 65 bpm.  No ectopy.  Normal intervals.  No ST segment changes.    September 2020 ECG (independently read by me): Normal sinus rhythm at 61 bpm.  Normal intervals.  No ectopy.  March 2020 ECG (independently read by me): Normal sinus rhythm at 69 bpm.  Poor anterior R wave progression V1 through V3.  December 08, 2017 ECG (independently read by me): NSR at 66; normal intervals  04/21/2017 ECG (independently read by me): Normal sinus rhythm at 66 bpm.  No ST segment changes.  Normal intervals.  No ectopy.  Very mild RV conduction delay.  Recent Labs: BMP Latest Ref Rng & Units 11/23/2018 06/04/2018 05/14/2017  Glucose 65 - 99 mg/dL 80 88 129(H)  BUN 8 - 27 mg/dL _0 Creatinine 0.57 - 1.00 mg/dL 0.94 0.89 0.59  BUN/Creat Ratio 12 - _1 -  Sodium 134 - 144 mmol/L 137 141 132(L)  Potassium 3.5 - 5.2 mmol/L 4.6 4.7 4.0  Chloride 96 - 106 mmol/L 103 103 98(L)  CO2 20 - 29 mmol/L _2 Calcium 8.7 - 10.3 mg/dL 9.4 9.3 8.4(L)     Hepatic Function Latest Ref Rng & Units 11/23/2018 06/04/2018  Total Protein 6.0 - 8.5 g/dL 6.6 6.6  Albumin 3.8 - 4.8 g/dL 4.0 4.1  AST 0 - 40 IU/L 21 26  ALT 0 - 32 IU/L 23 35(H)  Alk Phosphatase 39 - 117 IU/L 71 94  Total Bilirubin 0.0 - 1.2 mg/dL 0.5 0.5    CBC Latest Ref Rng & Units 11/23/2018 05/14/2017 05/07/2017  WBC 3.4 - 10.8 x10E3/uL 5.0 16.4(H) 7.3  Hemoglobin 11.1 - 15.9 g/dL 13.7 11.5(L) 13.6  Hematocrit 34.0 - 46.6 % 40.1 34.7(L) 40.6  Platelets 150 - 450 x10E3/uL 273 215 292   Lab Results  Component Value Date   MCV 97 11/23/2018   MCV  96.7 05/14/2017   MCV 96.9 05/07/2017   Lab Results  Component Value Date   TSH 1.830 11/23/2018   No results found for: HGBA1C   BNP    Component Value Date/Time   BNP 30.6 11/23/2018 0918    ProBNP No results found for: PROBNP   Lipid Panel     Component Value Date/Time   CHOL 162 11/23/2018 0918   TRIG 147 11/23/2018 0918   HDL 53 11/23/2018 0918   CHOLHDL 3.1 11/23/2018 0918   LDLCALC 84 11/23/2018 0918     RADIOLOGY: No results found.   Additional studies/ records that were reviewed today include:  I reviewed the patient's prior evaluation at the [facility] in 2014.  following a sleep study.  I reviewed her imaging studies, and prior evaluation by Dr. Melvyn Novas.   ASSESSMENT:    1. Essential hypertension   2. Pure hypercholesterolemia   3. Grade II diastolic dysfunction  4. Hypothyroidism, unspecified type   5. OSA (obstructive sleep apnea)   6. Large hiatal hernia s/p Nissan plication     PLAN:  Ms. 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  Remotely she had been on losartan for hypertension and pravastatin for hyperlipidemia.  Over the last several years her medications have been adjusted due to ARB impurities and also for mild orthostatic symptoms.  She has documented normal systolic function with grade 2 diastolic dysfunction.  Blood pressure today is stable without significant orthostatic changes on her current regimen of candesartan 16 mg and spironolactone 12.5 mg daily.  She is not having any chest pain.  She is now able to tolerate Zetia 10 mg and is back on low-dose pravastatin but has not had follow-up laboratory.  I will recheck fasting lipids and LFTs.  She was recently found to have a positive antinuclear antibody titer at 1:320 with a nuclear centromere pattern.  She is scheduled to undergo  go rheumatologic evaluation with Dr.Deveshwar.  She continues to be on levothyroxine for hypothyroidism.  She continues to experience some fatigue.  TSH level was 1.83 in September 2020.  She states that she has not been consistent using her oral appliance recently with reference to her obstructive sleep apnea.  She is having some hesitancy about being hospitalized for an elective procedure during this significant increase in COVID-19 surge.  I recommended that she discuss this with her urogynecologist.  The patient's daughter will be having a baby in May in [place] and if she does defer treatment she will try to factor in the timing of this elective surgery so that she will be able to be there for her new upcoming grandchild.  I will contact her regarding her laboratory.  I will see her in 6 months for reevaluation.   Time spent: 25 minutes  Medication Adjustments/Labs and Tests Ordered: Current medicines are reviewed at length with the patient today.  Concerns regarding medicines are outlined above.  Medication changes, Labs and Tests ordered today are listed in the Patient Instructions below. Patient Instructions  Medication Instructions:  Your physician recommends that you continue on your current medications as directed. Please refer to the Current Medication list given to you today.  *If you need a refill on your cardiac medications before your next appointment, please call your pharmacy*  Lab Work: Your physician recommends that you return for FASTING lab work at your earliest convenience: [place]  If you have labs (blood work) drawn today and your tests are completely normal, you will receive your results only by: Marland Kitchen MyChart Message (if you have MyChart) OR . A paper copy in the mail If you have any lab test that is abnormal or we need to change your treatment, we will call you to review the results.  Testing/Procedures: NONE  Follow-Up: At [facility], you and your  health needs are our priority.  As part of our continuing mission to provide you with exceptional heart care, we have created designated Provider Care Teams.  These Care Teams include your primary Cardiologist (physician) and Advanced Practice Providers (APPs -  Physician Assistants and Nurse Practitioners) who all work together to provide you with the care you need, when you need it.  Your next appointment:   6 month(s)  The format for your next appointment:   Either In Person or Virtual  Provider:   [name], MD      Signed, [name], MD  03/03/2019 5:36 PM    [facility] [place] [address] [place], [place], [place]  [address] Phone: [phone]

## 2019-03-02 NOTE — Patient Instructions (Signed)
Medication Instructions:  Your physician recommends that you continue on your current medications as directed. Please refer to the Current Medication list given to you today.  *If you need a refill on your cardiac medications before your next appointment, please call your pharmacy*  Lab Work: Your physician recommends that you return for FASTING lab work at your earliest convenience: [place]  If you have labs (blood work) drawn today and your tests are completely normal, you will receive your results only by: Marland Kitchen MyChart Message (if you have MyChart) OR . A paper copy in the mail If you have any lab test that is abnormal or we need to change your treatment, we will call you to review the results.  Testing/Procedures: NONE  Follow-Up: At [facility], you and your health needs are our priority.  As part of our continuing mission to provide you with exceptional heart care, we have created designated Provider Care Teams.  These Care Teams include your primary Cardiologist (physician) and Advanced Practice Providers (APPs -  Physician Assistants and Nurse Practitioners) who all work together to provide you with the care you need, when you need it.  Your next appointment:   6 month(s)  The format for your next appointment:   Either In Person or Virtual  Provider:   [name], MD

## 2019-03-03 ENCOUNTER — Encounter: Payer: Self-pay | Admitting: Cardiovascular Disease

## 2019-03-09 MED FILL — PRAVASTATIN NA 40 MG TAB: 40 | 90 days supply | Qty: 45 | Fill #1

## 2019-03-09 MED FILL — BUTALBITAL-APAP-CAFFEINE 50: 50-325-40 | 10 days supply | Qty: 60 | Fill #0

## 2019-03-09 MED FILL — HYDROCORTISONE 2.5% CREAM: 2.5 | 15 days supply | Qty: 60 | Fill #4

## 2019-03-09 MED FILL — EZETIMIBE 10 MG TABS: 10 | 90 days supply | Qty: 90 | Fill #1

## 2019-03-09 MED FILL — CANDESARTAN CILEXETIL 16 MG: 16 | 90 days supply | Qty: 90 | Fill #1

## 2019-03-09 MED FILL — SPIRONOLACTONE 25 MG TABS: 25 | 90 days supply | Qty: 90 | Fill #1

## 2019-03-16 DIAGNOSIS — M545 Low back pain: Secondary | ICD-10-CM | POA: Diagnosis not present

## 2019-03-16 DIAGNOSIS — M79605 Pain in left leg: Secondary | ICD-10-CM | POA: Diagnosis not present

## 2019-03-16 DIAGNOSIS — M542 Cervicalgia: Secondary | ICD-10-CM | POA: Diagnosis not present

## 2019-03-17 NOTE — Progress Notes (Deleted)
Virtual Visit via Video Note  I connected with Jenny Bradford on 03/17/19 at  8:45 AM EST by a video enabled telemedicine application and verified that I am speaking with the correct person using two identifiers.  Location: Patient: Home  Provider: Clinic  This service was conducted via virtual visit.  Both audio and visual tools were used.  The patient was located at home. I was located in my office.  Consent was obtained prior to the virtual visit and is aware of possible charges through their insurance for this visit.  The patient is an established patient.  Dr. Estanislado Pandy, MD conducted the virtual visit and Hazel Sams, PA-C acted as scribe during the service.  Office staff helped with scheduling follow up visits after the service was conducted.   I discussed the limitations of evaluation and management by telemedicine and the availability of in person appointments. The patient expressed understanding and agreed to proceed.  CC: History of Present Illness: Patient is a 69 year old female with past medical history of  Review of Systems  Constitutional: Negative for fever and malaise/fatigue.  Eyes: Negative for photophobia, pain, discharge and redness.  Respiratory: Negative for cough, shortness of breath and wheezing.   Cardiovascular: Negative for chest pain and palpitations.  Gastrointestinal: Negative for blood in stool, constipation and diarrhea.  Genitourinary: Negative for dysuria.  Musculoskeletal: Negative for back pain, joint pain, myalgias and neck pain.  Skin: Negative for rash.  Neurological: Negative for dizziness and headaches.  Psychiatric/Behavioral: Negative for depression. The patient is not nervous/anxious and does not have insomnia.       Observations/Objective: Physical Exam  Constitutional: She is oriented to person, place, and time and well-developed, well-nourished, and in no distress.  HENT:  Head: Normocephalic and atraumatic.  Eyes: Conjunctivae are  normal.  Pulmonary/Chest: Effort normal.  Neurological: She is alert and oriented to person, place, and time.  Psychiatric: Mood, memory, affect and judgment normal.   Patient reports morning stiffness for *** {minute/hour:19697}.   Patient {Actions; denies-reports:120008} nocturnal pain.  Difficulty dressing/grooming: {ACTIONS;DENIES/REPORTS:21021675::"Denies"} Difficulty climbing stairs: {ACTIONS;DENIES/REPORTS:21021675::"Denies"} Difficulty getting out of chair: {ACTIONS;DENIES/REPORTS:21021675::"Denies"} Difficulty using hands for taps, buttons, cutlery, and/or writing: {ACTIONS;DENIES/REPORTS:21021675::"Denies"}   Assessment and Plan:   Follow Up Instructions: She will follow up in    I discussed the assessment and treatment plan with the patient. The patient was provided an opportunity to ask questions and all were answered. The patient agreed with the plan and demonstrated an understanding of the instructions.   The patient was advised to call back or seek an in-person evaluation if the symptoms worsen or if the condition fails to improve as anticipated.  I provided *** minutes of non-face-to-face time during this encounter.   [name] Neas, PA-C

## 2019-03-18 ENCOUNTER — Other Ambulatory Visit: Payer: Self-pay

## 2019-03-18 ENCOUNTER — Telehealth (INDEPENDENT_AMBULATORY_CARE_PROVIDER_SITE_OTHER): Payer: Medicare Other | Admitting: Rheumatology

## 2019-03-18 ENCOUNTER — Telehealth: Payer: Self-pay | Admitting: Orthopaedic Surgery

## 2019-03-18 ENCOUNTER — Encounter: Payer: Self-pay | Admitting: Rheumatology

## 2019-03-18 DIAGNOSIS — Z8249 Family history of ischemic heart disease and other diseases of the circulatory system: Secondary | ICD-10-CM | POA: Diagnosis not present

## 2019-03-18 DIAGNOSIS — M170 Bilateral primary osteoarthritis of knee: Secondary | ICD-10-CM | POA: Diagnosis not present

## 2019-03-18 DIAGNOSIS — R768 Other specified abnormal immunological findings in serum: Secondary | ICD-10-CM

## 2019-03-18 DIAGNOSIS — M542 Cervicalgia: Secondary | ICD-10-CM | POA: Diagnosis not present

## 2019-03-18 DIAGNOSIS — I10 Essential (primary) hypertension: Secondary | ICD-10-CM | POA: Diagnosis not present

## 2019-03-18 DIAGNOSIS — Z8639 Personal history of other endocrine, nutritional and metabolic disease: Secondary | ICD-10-CM | POA: Diagnosis not present

## 2019-03-18 DIAGNOSIS — M1812 Unilateral primary osteoarthritis of first carpometacarpal joint, left hand: Secondary | ICD-10-CM | POA: Diagnosis not present

## 2019-03-18 DIAGNOSIS — Z8261 Family history of arthritis: Secondary | ICD-10-CM

## 2019-03-18 DIAGNOSIS — Z8269 Family history of other diseases of the musculoskeletal system and connective tissue: Secondary | ICD-10-CM

## 2019-03-18 DIAGNOSIS — M5030 Other cervical disc degeneration, unspecified cervical region: Secondary | ICD-10-CM

## 2019-03-18 DIAGNOSIS — K449 Diaphragmatic hernia without obstruction or gangrene: Secondary | ICD-10-CM | POA: Diagnosis not present

## 2019-03-18 DIAGNOSIS — Z8669 Personal history of other diseases of the nervous system and sense organs: Secondary | ICD-10-CM

## 2019-03-18 DIAGNOSIS — M79605 Pain in left leg: Secondary | ICD-10-CM | POA: Diagnosis not present

## 2019-03-18 DIAGNOSIS — M545 Low back pain: Secondary | ICD-10-CM | POA: Diagnosis not present

## 2019-03-18 DIAGNOSIS — G4733 Obstructive sleep apnea (adult) (pediatric): Secondary | ICD-10-CM | POA: Diagnosis not present

## 2019-03-18 DIAGNOSIS — R5383 Other fatigue: Secondary | ICD-10-CM

## 2019-03-18 NOTE — Telephone Encounter (Signed)
Jenny Bradford from [facility] called. They had sent a fax requesting a refill for her Voltaren. The call back number is [phone]. She would like a refill called in for patient.

## 2019-03-18 NOTE — Progress Notes (Signed)
Virtual Visit via Video Note  I connected with Jenny Bradford on 03/18/19 at  3:30 PM EST by a video enabled telemedicine application and verified that I am speaking with the correct person using two identifiers.  Location: Patient: Home  Provider: Clinic  This service was conducted via virtual visit.  Both audio and visual tools were used.  The patient was located at home. I was located in my office.  Consent was obtained prior to the virtual visit and is aware of possible charges through their insurance for this visit.  The patient is an established patient.  Dr. Estanislado Pandy, MD conducted the virtual visit and Hazel Sams, PA-C acted as scribe during the service.  Office staff helped with scheduling follow up visits after the service was conducted.     I discussed the limitations of evaluation and management by telemedicine and the availability of in person appointments. The patient expressed understanding and agreed to proceed.  CC: History of Present Illness: Patient is a 69 year old female with a past medical history of osteoarthritis, seen in consultation per request of Dr.Xu according to patient for the last year she has been experiencing increased fatigue and shortness of breath. She has been to her cardiologist and no additional diagnoses were established. She states she has had longstanding history of osteoarthritis in her knee joints for which she has been seeing Dr.Xu. She also has arthritis in her cervical spine and her shoulder joints. She states she has been experiencing muscle pain in her thighs and left deltoid for the last 3 months. She has been going to physical therapy which is improved. She had recent labs done by Dr. Erlinda Hong which showed positive ANA and for that reason she was referred to me. She denies any history of oral ulcers, nasal ulcers, malar rash, photosensitivity, Raynaud's phenomenon, lymphadenopathy or joint swelling. There is positive family history of autoimmune disease.  She gives history of rheumatoid arthritis in her mother, PMR in her maternal aunt and Raynaud's phenomenon in her daughter.  Review of Systems  Constitutional: Negative for fever and malaise/fatigue.  HENT:       Denies any oral or nasal ulcerations Denies mouth dryness   Eyes: Negative for photophobia, pain, discharge and redness.       Denies eye dryness  Respiratory: Negative for cough, shortness of breath and wheezing.   Cardiovascular: Negative for chest pain and palpitations.  Gastrointestinal: Negative for blood in stool, constipation and diarrhea.  Genitourinary: Negative for dysuria.  Musculoskeletal: Positive for joint pain and neck pain. Negative for back pain and myalgias.  Skin: Negative for rash.       Denies symptoms of Raynaud's Denies photosensitivity   Neurological: Negative for dizziness and headaches.  Psychiatric/Behavioral: Negative for depression. The patient is not nervous/anxious and does not have insomnia.       Observations/Objective: Physical Exam  Constitutional: She is oriented to person, place, and time and well-developed, well-nourished, and in no distress.  HENT:  Head: Normocephalic and atraumatic.  Eyes: Conjunctivae are normal.  Pulmonary/Chest: Effort normal.  Neurological: She is alert and oriented to person, place, and time.  Psychiatric: Mood, memory, affect and judgment normal.    Patient reports morning stiffness for 15  minutes.   Patient reports nocturnal pain.  Difficulty dressing/grooming: Denies Difficulty climbing stairs: Reports Difficulty getting out of chair: Reports Difficulty using hands for taps, buttons, cutlery, and/or writing: Reports   11/23/18: TSH 1.830, vitamin D 40.6, BNP 30.6, vitamin B12 549,  Mg 2.2 12/07/18: ESR 7, CK 86 01/19/19: ANA 1:320 nuclear, centromere, 1:40 cytoplasmic, ESR 6, RF<14, uric acid 5.1   Assessment and Plan: Diagnoses and all orders for this visit:  Positive ANA (antinuclear  antibody)- Patient had labs done by Dr.Xu recently due to increased joint pain, muscle pain and fatigue. Her ANA titer is significantly positive. Although she does not have any clinical features of autoimmune disease which she described today. I will obtain AVISE labs. Her most recent labs showed her CK, sedimentation rate and rheumatoid factor were normal. Comments: ANA 1:320 nuclear, centromere, 1:40 cytoplasmic, ESR 6, RF<14, CK 86  Other fatigue- She has been experiencing fatigue and some shortness of breath for the last 1 year. She states her cardiology work-up did not add to the diagnosis. On chart review I found that she had a CT scan of her chest in June 2020 which showed some pleural scarring and some groundglass changes which were unchanged when compared to previous CT scan.  She has been monitored by Dr. Melvyn Novas he plans to continue monitoring her by doing serial CT scans.  Arthritis of carpometacarpal (CMC) joint of left thumb- Patient has history of osteoarthritis in her bilateral hands and had bilateral CMC repair. She still continues to have some discomfort in her hands.  Primary osteoarthritis of both knees- She has known history of osteoarthritis in her knee joints and sees Dr.Xu. She has had injections in her knee joints.  DDD (degenerative disc disease), cervical- I reviewed x-ray of her cervical spine which showed multilevel spondylosis with degenerative disc disease. Pain in her cervical spine.  Other medical problems are listed as follows:  OSA (obstructive sleep apnea)  Essential hypertension  Paraesophageal hernia  Family history of coronary artery disease in father  Hx of migraines  History of hyperlipidemia  Family history of rheumatoid arthritis Comments: Mother   Family history of polymyalgia rheumatica Comments: Aunt  Family history of Raynaud's phenomenon Comments: Daughter    Follow Up Instructions: She will follow up    I discussed the  assessment and treatment plan with the patient. The patient was provided an opportunity to ask questions and all were answered. The patient agreed with the plan and demonstrated an understanding of the instructions.   The patient was advised to call back or seek an in-person evaluation if the symptoms worsen or if the condition fails to improve as anticipated.  I provided 30 minutes of non-face-to-face time during this encounter.   Bo Merino, MD

## 2019-03-19 ENCOUNTER — Other Ambulatory Visit: Payer: Self-pay

## 2019-03-19 ENCOUNTER — Other Ambulatory Visit: Payer: Self-pay | Admitting: Obstetrics & Gynecology

## 2019-03-19 ENCOUNTER — Telehealth: Payer: Medicare Other | Admitting: Rheumatology

## 2019-03-19 DIAGNOSIS — Z1231 Encounter for screening mammogram for malignant neoplasm of breast: Secondary | ICD-10-CM

## 2019-03-19 MED ORDER — DICLOFENAC SODIUM 1 % EX GEL: 2.0000 g | Freq: Four times a day (QID) | CUTANEOUS | 0 refills | Status: DC

## 2019-03-19 MED FILL — DICLOFENAC SODIUM 1 % GEL: 1 | 13 days supply | Qty: 100 | Fill #0

## 2019-03-19 NOTE — Telephone Encounter (Signed)
Ok to refill 

## 2019-03-19 NOTE — Telephone Encounter (Signed)
Rx sent to pharm

## 2019-03-23 ENCOUNTER — Encounter: Payer: Self-pay | Admitting: Rheumatology

## 2019-03-23 DIAGNOSIS — R768 Other specified abnormal immunological findings in serum: Secondary | ICD-10-CM | POA: Diagnosis not present

## 2019-03-30 DIAGNOSIS — M545 Low back pain: Secondary | ICD-10-CM | POA: Diagnosis not present

## 2019-03-30 DIAGNOSIS — M79605 Pain in left leg: Secondary | ICD-10-CM | POA: Diagnosis not present

## 2019-03-30 DIAGNOSIS — M542 Cervicalgia: Secondary | ICD-10-CM | POA: Diagnosis not present

## 2019-04-01 DIAGNOSIS — M545 Low back pain: Secondary | ICD-10-CM | POA: Diagnosis not present

## 2019-04-01 DIAGNOSIS — M79605 Pain in left leg: Secondary | ICD-10-CM | POA: Diagnosis not present

## 2019-04-01 DIAGNOSIS — M542 Cervicalgia: Secondary | ICD-10-CM | POA: Diagnosis not present

## 2019-04-05 DIAGNOSIS — M542 Cervicalgia: Secondary | ICD-10-CM | POA: Diagnosis not present

## 2019-04-05 DIAGNOSIS — M545 Low back pain: Secondary | ICD-10-CM | POA: Diagnosis not present

## 2019-04-05 DIAGNOSIS — M79605 Pain in left leg: Secondary | ICD-10-CM | POA: Diagnosis not present

## 2019-04-06 ENCOUNTER — Encounter: Payer: Self-pay | Admitting: Rheumatology

## 2019-04-06 NOTE — Progress Notes (Signed)
Office Visit Note  Patient: Jenny Bradford             Date of Birth: 13-Dec-1950           MRN: [id number]             PCP: [name], FNP Referring: [name], FNP Visit Date: 04/13/2019 Occupation: @GUAROCC @  Subjective:  Discuss lab work   History of Present Illness: Jenny Bradford is a 69 y.o. female with history of positive ANA. Jenny Bradford denies any recent rashes, photosensitivity, or hair loss.  Jenny Bradford denies any oral or nasal ulcerations.  Jenny Bradford has not had any sicca symptoms.  Jenny Bradford denies any skin thickening or tightness.  Jenny Bradford has chronic arthralgias and intermittent joint swelling.  Her morning stiffness has been lasting 15-30 minutes.     Activities of Daily Living:  Patient reports morning stiffness for 15-30 minutes.   Patient Reports nocturnal pain.  Difficulty dressing/grooming: Reports Difficulty climbing stairs: Reports Difficulty getting out of chair: Reports Difficulty using hands for taps, buttons, cutlery, and/or writing: Reports  Review of Systems  Constitutional: Positive for fatigue.  HENT: Negative for mouth sores, mouth dryness and nose dryness.   Eyes: Negative for pain, itching, visual disturbance and dryness.  Respiratory: Negative for cough, hemoptysis, shortness of breath, wheezing and difficulty breathing.   Cardiovascular: Negative for chest pain, palpitations, hypertension and swelling in legs/feet.  Gastrointestinal: Positive for constipation. Negative for blood in stool and diarrhea.  Endocrine: Negative for increased urination.  Genitourinary: Negative for difficulty urinating and painful urination.  Musculoskeletal: Positive for arthralgias, joint pain, joint swelling and morning stiffness. Negative for myalgias, muscle weakness, muscle tenderness and myalgias.  Skin: Negative for color change, pallor, rash, hair loss, nodules/bumps, skin tightness, ulcers and sensitivity to sunlight.  Allergic/Immunologic: Negative for susceptible to  infections.  Neurological: Negative for dizziness, numbness, headaches and memory loss.  Hematological: Negative for bruising/bleeding tendency and swollen glands.  Psychiatric/Behavioral: Negative for depressed mood, confusion and sleep disturbance. The patient is not nervous/anxious.     PMFS History:  Patient Active Problem List   Diagnosis Date Noted  . Primary osteoarthritis of left knee 01/26/2019  . Primary osteoarthritis of right knee 01/26/2019  . Palpitations 09/04/2017  . Family history of coronary artery disease in father 09/04/2017  . Paraesophageal hernia 05/13/2017  . Neck pain 01/03/2017  . Large hiatal hernia 12/28/2016  . Multiple pulmonary nodules determined by computed tomography of lung 12/27/2016  . Arthritis of carpometacarpal [facility]) joint of left thumb 05/28/2016  . OSA (obstructive sleep apnea) 10/05/2012  . HTN (hypertension) 10/05/2012  . Hyperlipidemia 10/05/2012  . Migraine headache 10/05/2012    Past Medical History:  Diagnosis Date  . AAA (abdominal aortic aneurysm) ([place]) 08/2014   scanning every 2 years  . Arthritis    oa  . Complication of anesthesia    slow to awaken in past  . Family history of anesthesia complication    slow to awaken  . GERD (gastroesophageal reflux disease)   . Grade II diastolic dysfunction [id number]   Noted on ECHO  . H/O hiatal hernia   . Headache(784.0)    migraines (rare)  . Hemorrhoids   . Hyperlipidemia   . Hypertension 04/01/11   ECHO-EF>55% NUC STRESS TEST- 05/01/12  . Hypothyroidism   . Mild aortic valve regurgitation 04/2017   Noted on ECHO  . Mitral valve regurgitation 04/2017   Mild: Noted on ECHO  . Pulmonary nodules 2018  scanning every 6 months  . Sleep apnea 05/01/12 & 05/29/12   SLEEP STUDY-[facility], NO CPAP USED SINCE MAY 2015 Uses oral device    Family History  Problem Relation Age of Onset  . Breast cancer Mother   . Ovarian cancer Mother   . Rheum arthritis Mother   .  Heart attack Father   . Heart disease Father   . Hypertension Brother   . Heart disease Brother   . Hyperlipidemia Maternal Grandmother   . Hypertension Maternal Grandmother   . Polymyalgia rheumatica Paternal Aunt   . Hypertension Son   . Migraines Daughter   . Raynaud syndrome Daughter    Past Surgical History:  Procedure Laterality Date  . ANKLE SURGERY Right 1980  . CARPOMETACARPEL SUSPENSION PLASTY Right 12/14/2014   Procedure: RIGHT THUMB CARPOMETACARPAL ([place]) ARTHROPLASTY;  Surgeon: Leandrew Koyanagi, MD;  Location: [facility];  Service: Orthopedics;  Laterality: Right;  . CARPOMETACARPEL SUSPENSION PLASTY Left 03/19/2017   Procedure: Left Thumb Ligament Reconstruction and Tendon Interposition;  Surgeon: Leandrew Koyanagi, MD;  Location: [facility];  Service: Orthopedics;  Laterality: Left;  . COLONOSCOPY WITH PROPOFOL N/A 12/07/2013   Procedure: COLONOSCOPY WITH PROPOFOL;  Surgeon: Garlan Fair, MD;  Location: [facility];  Service: Endoscopy;  Laterality: N/A;  . CYSTECTOMY  1975  . FOOT SURGERY Right 9.23.14  . FOOT SURGERY Left 10.01.13  . [place]  2005  . HERNIA REPAIR  2019  . Miscarriage  38  . TONSILLECTOMY  1964   Social History   Social History Narrative  . Not on file   Immunization History  Administered Date(s) Administered  . Influenza Whole 11/09/2016  . Influenza-Unspecified 11/10/2014     Objective: Vital Signs: BP 118/75 (BP Location: Left Arm, Patient Position: Sitting, Cuff Size: Normal)   Pulse 66   Resp 15   Ht 5\' 2"  (1.575 m)   Wt 169 lb (76.7 kg)   BMI 30.91 kg/m    Physical Exam Vitals and nursing note reviewed.  Constitutional:      Appearance: Jenny Bradford is well-developed.  HENT:     Head: Normocephalic and atraumatic.  Eyes:     Conjunctiva/sclera: Conjunctivae normal.  Cardiovascular:     Rate and Rhythm: Normal rate and regular rhythm.     Heart sounds: Normal heart sounds.  Pulmonary:      Effort: Pulmonary effort is normal.     Breath sounds: Normal breath sounds.  Abdominal:     General: Bowel sounds are normal.     Palpations: Abdomen is soft.  Musculoskeletal:     Cervical back: Normal range of motion.  Lymphadenopathy:     Cervical: No cervical adenopathy.  Skin:    General: Skin is warm and dry.     Capillary Refill: Capillary refill takes less than 2 seconds.     Comments: No sclerodactyly.  No telangiectasias noted  Neurological:     Mental Status: Jenny Bradford is alert and oriented to person, place, and time.  Psychiatric:        Behavior: Behavior normal.      Musculoskeletal Exam:  C-spine, thoracic spine, and lumbar spine good ROM.  Shoulder joints, elbow joints, wrist joints, MCPs, PIPs, and DIPs good ROM with no synovitis.  Complete fist formation bilaterally. PIP and DIP synovial thickening consistent with osteoarthritis of both hands.  Hip joints, knee joints, ankle joints, MTPs, PIPs, and DIPs good ROM with no synovitis.  No warmth  or effusion of knee joints.  No tenderness or effusion of ankle joints. Bunions bilaterally.   CDAI Exam: CDAI Score: -- Patient Global: --; Provider Global: -- Swollen: --; Tender: -- Joint Exam 04/13/2019   No joint exam has been documented for this visit   There is currently no information documented on the homunculus. Go to the Rheumatology activity and complete the homunculus joint exam.  Investigation: No additional findings.  Imaging: No results found.  Recent Labs: Lab Results  Component Value Date   WBC 5.0 11/23/2018   HGB 13.7 11/23/2018   PLT 273 11/23/2018   NA 137 11/23/2018   K 4.6 11/23/2018   CL 103 11/23/2018   CO2 20 11/23/2018   GLUCOSE 80 11/23/2018   BUN 13 11/23/2018   CREATININE 0.94 11/23/2018   BILITOT 0.5 11/23/2018   ALKPHOS 71 11/23/2018   AST 21 11/23/2018   ALT 23 11/23/2018   PROT 6.6 11/23/2018   ALBUMIN 4.0 11/23/2018   CALCIUM 9.4 11/23/2018   GFRAA 74 11/23/2018   March 23, 2019 AVISE lupus index -0.1, ANA 1: 640 centromere, anti-Ro antibody 7 (equivocal), anticardiolipin IgG 38, antibeta-2 GP 1 IgG 24, antithyroglobulin positive, anti-TPO positive  Speciality Comments: No specialty comments available.  Procedures:  No procedures performed Allergies: Midol [aspirin-cinnamedrine-caffeine], Olmesartan, Talwin [pentazocine], Bisoprolol, and Naproxen      Assessment / Plan:     Visit Diagnoses: Positive ANA (antinuclear antibody) - ANA 1: 640 centromere, anticardiolipin IgG 38, beta-2 GP 1 IgG 24: We reviewed AVISE lab work and all questions were addressed today.  Jenny Bradford is not experiencing any clinical signs or symptoms of autoimmune disease at this time.  Jenny Bradford has not had any oral or nasal ulcerations, rashes, sicca symptoms, enlarged lymph nodes, fevers, chest pain, or shortness of breath recently.  Jenny Bradford experiences intermittent symptoms of possible Raynaud's in both feet. No digital ulcerations or signs of gangrene noted.   No telangiectasias or sclerodactyly noted.  Good capillary refill on exam.  Jenny Bradford has no clinical features of systemic lupus or scleroderma.  Jenny Bradford was found to have anticardiolipin IgG 38 and beta-2 GP 1 IgG 24, so we will recheck these labs in 3 months to determine the clinical significance. Jenny Bradford was advised to start taking Aspirin 81 mg 1 tablet by mouth daily.  Jenny Bradford advised to notify us if develops any new or worsening symptoms.  Jenny Bradford will follow-up in the office in 3 months.  Other fatigue: Jenny Bradford has been experiencing significant chronic fatigue.  Jenny Bradford is unsure if it is due to being more sedentary since retiring.   Shortness of breath - Followed by Dr. Melvyn Novas.  Jenny Bradford is not experiencing any new or worsening pulmonary symptoms.  Jenny Bradford had a chest CT on 09/07/2018 for yearly surveillance of a pulmonary nodule.  Results revealed groundglass in part solid nodules in the lungs bilaterally which were stable from 06/05/2017 and likely stable from 12/05/2016.  Jenny Bradford will  continue to have yearly CT scans since malignancy cannot be excluded.  DDD (degenerative disc disease), cervical: Jenny Bradford has good range of motion with no discomfort.  Jenny Bradford has no symptoms of radiculopathy at this time.  Arthritis of carpometacarpal (CMC) joint of left thumb: Status post surgery in the past.  No tenderness or inflammation noted.  Primary osteoarthritis of both knees -Jenny Bradford has chronic pain of both knee joints.  Jenny Bradford is good range of motion with no warmth or effusion on exam.  Jenny Bradford had Visco gel injections in bilateral knee  joints on 01/26/2019.  Jenny Bradford is followed by Dr. Erlinda Hong.   Other medical conditions are listed as follows:   Essential hypertension  History of hyperlipidemia  Paraesophageal hernia  Hx of migraines  OSA (obstructive sleep apnea)  Family history of rheumatoid arthritis  Family history of Raynaud's phenomenon  Family history of polymyalgia rheumatica  Family history of coronary artery disease in father  Orders: No orders of the defined types were placed in this encounter.  No orders of the defined types were placed in this encounter.   Face-to-face time spent with patient was 30 minutes. Greater than 50% of time was spent in counseling and coordination of care.  Follow-Up Instructions: Return in about 3 months (around 07/11/2019) for Positive ANA.   [name] Neas, PA-C   I examined and evaluated the patient with Hazel Sams PA.  I detailed discussion regarding the lab work with the patient.  Jenny Bradford has positive ANA centromere pattern but no clinical features for scleroderma.  Jenny Bradford also has antibodies to anticardiolipin and beta-2 GP 1.  Use of low-dose aspirin was discussed.  We will repeat antibodies in 3 months.  Jenny Bradford has some pulmonary nodulosis with groundglass changes.  According to Dr. Gustavus Bryant notes which has been stable.  The plan of care was discussed as noted above.  Bo Merino, MD Note - This record has been created using Editor, commissioning.  Chart  creation errors have been sought, but may not always  have been located. Such creation errors do not reflect on  the standard of medical care.

## 2019-04-07 MED FILL — ALPRAZolam 0.25 MG TABS: 0.25 | 30 days supply | Qty: 30 | Fill #0

## 2019-04-08 DIAGNOSIS — M545 Low back pain: Secondary | ICD-10-CM | POA: Diagnosis not present

## 2019-04-08 DIAGNOSIS — M79605 Pain in left leg: Secondary | ICD-10-CM | POA: Diagnosis not present

## 2019-04-08 DIAGNOSIS — M542 Cervicalgia: Secondary | ICD-10-CM | POA: Diagnosis not present

## 2019-04-12 DIAGNOSIS — M79605 Pain in left leg: Secondary | ICD-10-CM | POA: Diagnosis not present

## 2019-04-12 DIAGNOSIS — M545 Low back pain: Secondary | ICD-10-CM | POA: Diagnosis not present

## 2019-04-12 DIAGNOSIS — M542 Cervicalgia: Secondary | ICD-10-CM | POA: Diagnosis not present

## 2019-04-13 ENCOUNTER — Ambulatory Visit (INDEPENDENT_AMBULATORY_CARE_PROVIDER_SITE_OTHER): Payer: Medicare Other | Admitting: Rheumatology

## 2019-04-13 ENCOUNTER — Encounter: Payer: Self-pay | Admitting: Rheumatology

## 2019-04-13 ENCOUNTER — Other Ambulatory Visit: Payer: Self-pay

## 2019-04-13 VITALS — BP 118/75 | HR 66 | Resp 15 | Ht 62.0 in | Wt 169.0 lb

## 2019-04-13 DIAGNOSIS — M5030 Other cervical disc degeneration, unspecified cervical region: Secondary | ICD-10-CM

## 2019-04-13 DIAGNOSIS — R768 Other specified abnormal immunological findings in serum: Secondary | ICD-10-CM

## 2019-04-13 DIAGNOSIS — Z8269 Family history of other diseases of the musculoskeletal system and connective tissue: Secondary | ICD-10-CM

## 2019-04-13 DIAGNOSIS — M170 Bilateral primary osteoarthritis of knee: Secondary | ICD-10-CM | POA: Diagnosis not present

## 2019-04-13 DIAGNOSIS — M1812 Unilateral primary osteoarthritis of first carpometacarpal joint, left hand: Secondary | ICD-10-CM

## 2019-04-13 DIAGNOSIS — Z8261 Family history of arthritis: Secondary | ICD-10-CM

## 2019-04-13 DIAGNOSIS — R0602 Shortness of breath: Secondary | ICD-10-CM | POA: Diagnosis not present

## 2019-04-13 DIAGNOSIS — R5383 Other fatigue: Secondary | ICD-10-CM

## 2019-04-13 DIAGNOSIS — K449 Diaphragmatic hernia without obstruction or gangrene: Secondary | ICD-10-CM | POA: Diagnosis not present

## 2019-04-13 DIAGNOSIS — Z8249 Family history of ischemic heart disease and other diseases of the circulatory system: Secondary | ICD-10-CM

## 2019-04-13 DIAGNOSIS — I10 Essential (primary) hypertension: Secondary | ICD-10-CM | POA: Diagnosis not present

## 2019-04-13 DIAGNOSIS — Z8669 Personal history of other diseases of the nervous system and sense organs: Secondary | ICD-10-CM | POA: Diagnosis not present

## 2019-04-13 DIAGNOSIS — Z8639 Personal history of other endocrine, nutritional and metabolic disease: Secondary | ICD-10-CM | POA: Diagnosis not present

## 2019-04-13 DIAGNOSIS — G4733 Obstructive sleep apnea (adult) (pediatric): Secondary | ICD-10-CM | POA: Diagnosis not present

## 2019-04-15 DIAGNOSIS — M79605 Pain in left leg: Secondary | ICD-10-CM | POA: Diagnosis not present

## 2019-04-15 DIAGNOSIS — M545 Low back pain: Secondary | ICD-10-CM | POA: Diagnosis not present

## 2019-04-15 DIAGNOSIS — M542 Cervicalgia: Secondary | ICD-10-CM | POA: Diagnosis not present

## 2019-04-18 ENCOUNTER — Ambulatory Visit: Payer: Medicare Other

## 2019-04-20 DIAGNOSIS — M79605 Pain in left leg: Secondary | ICD-10-CM | POA: Diagnosis not present

## 2019-04-20 DIAGNOSIS — M542 Cervicalgia: Secondary | ICD-10-CM | POA: Diagnosis not present

## 2019-04-20 DIAGNOSIS — M545 Low back pain: Secondary | ICD-10-CM | POA: Diagnosis not present

## 2019-04-22 ENCOUNTER — Encounter: Payer: Self-pay | Admitting: Rheumatology

## 2019-04-22 DIAGNOSIS — Z79899 Other long term (current) drug therapy: Secondary | ICD-10-CM

## 2019-04-22 DIAGNOSIS — M542 Cervicalgia: Secondary | ICD-10-CM | POA: Diagnosis not present

## 2019-04-22 DIAGNOSIS — M545 Low back pain: Secondary | ICD-10-CM | POA: Diagnosis not present

## 2019-04-22 DIAGNOSIS — R768 Other specified abnormal immunological findings in serum: Secondary | ICD-10-CM

## 2019-04-22 DIAGNOSIS — M79605 Pain in left leg: Secondary | ICD-10-CM | POA: Diagnosis not present

## 2019-04-22 NOTE — Telephone Encounter (Signed)
Please advise 

## 2019-04-27 ENCOUNTER — Other Ambulatory Visit: Payer: Self-pay | Admitting: Orthopaedic Surgery

## 2019-04-27 DIAGNOSIS — M79605 Pain in left leg: Secondary | ICD-10-CM | POA: Diagnosis not present

## 2019-04-27 DIAGNOSIS — M545 Low back pain: Secondary | ICD-10-CM | POA: Diagnosis not present

## 2019-04-27 DIAGNOSIS — M542 Cervicalgia: Secondary | ICD-10-CM | POA: Diagnosis not present

## 2019-04-27 MED FILL — DICLOFENAC SODIUM 1 % GEL: 1 | 13 days supply | Qty: 100 | Fill #0

## 2019-04-27 NOTE — Telephone Encounter (Signed)
Consulted with provider at [facility] pulmonary.  Patient does not have evidence of ILD and current nodules are stable.  Too early to assess if CT scan results or due to positive ANA.  Did not see any concerns for starting immunosuppressive therapy.  Called patient to discuss.  Patient is hesitant to start immunosuppressive therapy.  Dr. Estanislado Pandy discussed starting Plaquenil or Imuran.  Discussed Plaquenil mechanism of action, dosing, and side effect profile.  Discussed the need for baseline eye exam and monitoring yearly.  Discussed the need for baseline lab work and routine monitoring.  Patient verbalized understanding.  She is okay with proceeding with Plaquenil.  She would like to start Plaquenil 2 weeks after her second Covid 19 vaccine.  Patient also due for Pneumovax 23.  Discussed with patient that she does not have to hold Plaquenil for vaccines or infection.  Patient verbalized understanding.  Advised we will have to obtain consent prior to treatment and will need 1 month follow up after starting Plaquenil.  Patient verbalized understanding and would like consent form mailed to her.  We will discuss with Dr. Estanislado Pandy about patient starting Plaquenil and next steps.  All questions encouraged and answered.  Instructed patient to call with any further questions or concerns.  Mariella Saa, PharmD, [place], [place] Clinical Specialty Pharmacist [phone]  04/27/2019 2:41 PM

## 2019-04-27 NOTE — Telephone Encounter (Signed)
I discussed with patient during the last visit that we will repeat labs.  If her titers are significantly high then we may consider using Plaquenil .

## 2019-04-30 ENCOUNTER — Ambulatory Visit: Payer: Medicare Other

## 2019-05-03 DIAGNOSIS — M79605 Pain in left leg: Secondary | ICD-10-CM | POA: Diagnosis not present

## 2019-05-03 DIAGNOSIS — M542 Cervicalgia: Secondary | ICD-10-CM | POA: Diagnosis not present

## 2019-05-03 DIAGNOSIS — M545 Low back pain: Secondary | ICD-10-CM | POA: Diagnosis not present

## 2019-05-06 DIAGNOSIS — M545 Low back pain: Secondary | ICD-10-CM | POA: Diagnosis not present

## 2019-05-06 DIAGNOSIS — M542 Cervicalgia: Secondary | ICD-10-CM | POA: Diagnosis not present

## 2019-05-06 DIAGNOSIS — M79605 Pain in left leg: Secondary | ICD-10-CM | POA: Diagnosis not present

## 2019-05-09 ENCOUNTER — Ambulatory Visit: Payer: Medicare Other

## 2019-05-11 DIAGNOSIS — M542 Cervicalgia: Secondary | ICD-10-CM | POA: Diagnosis not present

## 2019-05-11 DIAGNOSIS — M79605 Pain in left leg: Secondary | ICD-10-CM | POA: Diagnosis not present

## 2019-05-11 DIAGNOSIS — M545 Low back pain: Secondary | ICD-10-CM | POA: Diagnosis not present

## 2019-05-12 MED FILL — ESTRADIOL 0.1 MG/GM CRM: 0.1 | 90 days supply | Qty: 43 | Fill #1

## 2019-05-12 MED FILL — LEVOTHYROXINE 50 MCG TABLET: 50 | 90 days supply | Qty: 90 | Fill #1

## 2019-05-12 MED FILL — ALPRAZolam 0.25 MG TABS: 0.25 | 30 days supply | Qty: 30 | Fill #1

## 2019-05-13 ENCOUNTER — Other Ambulatory Visit: Payer: Self-pay | Admitting: Physician Assistant

## 2019-05-13 DIAGNOSIS — M542 Cervicalgia: Secondary | ICD-10-CM | POA: Diagnosis not present

## 2019-05-13 DIAGNOSIS — M545 Low back pain: Secondary | ICD-10-CM | POA: Diagnosis not present

## 2019-05-13 DIAGNOSIS — M79605 Pain in left leg: Secondary | ICD-10-CM | POA: Diagnosis not present

## 2019-05-18 ENCOUNTER — Other Ambulatory Visit: Payer: Self-pay

## 2019-05-18 ENCOUNTER — Encounter: Payer: Self-pay | Admitting: Orthopaedic Surgery

## 2019-05-18 ENCOUNTER — Other Ambulatory Visit: Payer: Self-pay | Admitting: Orthopaedic Surgery

## 2019-05-18 DIAGNOSIS — M79605 Pain in left leg: Secondary | ICD-10-CM | POA: Diagnosis not present

## 2019-05-18 DIAGNOSIS — M542 Cervicalgia: Secondary | ICD-10-CM | POA: Diagnosis not present

## 2019-05-18 DIAGNOSIS — M545 Low back pain: Secondary | ICD-10-CM | POA: Diagnosis not present

## 2019-05-18 MED ORDER — DICLOFENAC SODIUM 1 % EX GEL: 2.0000 g | Freq: Four times a day (QID) | CUTANEOUS | 12 refills | Status: DC

## 2019-05-18 MED FILL — DICLOFENAC SODIUM 1 % GEL: 1 | 30 days supply | Qty: 300 | Fill #0

## 2019-05-25 DIAGNOSIS — M79605 Pain in left leg: Secondary | ICD-10-CM | POA: Diagnosis not present

## 2019-05-25 DIAGNOSIS — M545 Low back pain: Secondary | ICD-10-CM | POA: Diagnosis not present

## 2019-05-25 DIAGNOSIS — M542 Cervicalgia: Secondary | ICD-10-CM | POA: Diagnosis not present

## 2019-06-01 DIAGNOSIS — M545 Low back pain: Secondary | ICD-10-CM | POA: Diagnosis not present

## 2019-06-01 DIAGNOSIS — M79605 Pain in left leg: Secondary | ICD-10-CM | POA: Diagnosis not present

## 2019-06-01 DIAGNOSIS — M542 Cervicalgia: Secondary | ICD-10-CM | POA: Diagnosis not present

## 2019-06-03 ENCOUNTER — Other Ambulatory Visit: Payer: Self-pay | Admitting: Cardiovascular Disease

## 2019-06-03 MED FILL — EZETIMIBE 10 MG TABS: 10 | 90 days supply | Qty: 90 | Fill #2

## 2019-06-03 MED FILL — PRAVASTATIN NA 40 MG TAB: 40 | 90 days supply | Qty: 45 | Fill #2

## 2019-06-03 MED FILL — ESCITALOPRAM 20 MG TABLET: 20 | 90 days supply | Qty: 90 | Fill #2

## 2019-06-07 ENCOUNTER — Other Ambulatory Visit: Payer: Self-pay | Admitting: Cardiovascular Disease

## 2019-06-07 MED FILL — SPIRONOLACTONE 25 MG TABS: 25 | 90 days supply | Qty: 45 | Fill #0

## 2019-06-08 DIAGNOSIS — M545 Low back pain: Secondary | ICD-10-CM | POA: Diagnosis not present

## 2019-06-08 DIAGNOSIS — M79605 Pain in left leg: Secondary | ICD-10-CM | POA: Diagnosis not present

## 2019-06-08 DIAGNOSIS — M542 Cervicalgia: Secondary | ICD-10-CM | POA: Diagnosis not present

## 2019-06-08 MED FILL — LINZESS 72 MCG CAPSULE: 72 | 15 days supply | Qty: 15 | Fill #2

## 2019-06-08 MED FILL — ALPRAZolam 0.25 MG TABS: 0.25 | 30 days supply | Qty: 30 | Fill #0

## 2019-06-09 ENCOUNTER — Encounter: Payer: Self-pay | Admitting: Rheumatology

## 2019-06-10 ENCOUNTER — Other Ambulatory Visit: Payer: Self-pay | Admitting: Cardiovascular Disease

## 2019-06-10 MED FILL — CANDESARTAN CILEXETIL 16 MG: 16 | 90 days supply | Qty: 90 | Fill #0

## 2019-06-15 ENCOUNTER — Encounter: Payer: Self-pay | Admitting: Rheumatology

## 2019-06-15 DIAGNOSIS — Z23 Encounter for immunization: Secondary | ICD-10-CM | POA: Diagnosis not present

## 2019-06-18 ENCOUNTER — Other Ambulatory Visit: Payer: Self-pay | Admitting: *Deleted

## 2019-06-18 DIAGNOSIS — Z79899 Other long term (current) drug therapy: Secondary | ICD-10-CM | POA: Diagnosis not present

## 2019-06-18 DIAGNOSIS — R768 Other specified abnormal immunological findings in serum: Secondary | ICD-10-CM | POA: Diagnosis not present

## 2019-06-22 DIAGNOSIS — M545 Low back pain: Secondary | ICD-10-CM | POA: Diagnosis not present

## 2019-06-22 DIAGNOSIS — M79605 Pain in left leg: Secondary | ICD-10-CM | POA: Diagnosis not present

## 2019-06-22 DIAGNOSIS — M542 Cervicalgia: Secondary | ICD-10-CM | POA: Diagnosis not present

## 2019-06-22 LAB — CBC WITH DIFFERENTIAL/PLATELET
Absolute Monocytes: 371 cells/uL (ref 200–950)
Basophils Absolute: 41 cells/uL (ref 0–200)
Basophils Relative: 0.7 %
Eosinophils Absolute: 151 cells/uL (ref 15–500)
Eosinophils Relative: 2.6 %
HCT: 41 % (ref 35.0–45.0)
Hemoglobin: 13.6 g/dL (ref 11.7–15.5)
Lymphs Abs: 661 cells/uL — ABNORMAL LOW (ref 850–3900)
MCH: 32.5 pg (ref 27.0–33.0)
MCHC: 33.2 g/dL (ref 32.0–36.0)
MCV: 97.9 fL (ref 80.0–100.0)
MPV: 9.2 fL (ref 7.5–12.5)
Monocytes Relative: 6.4 %
Neutro Abs: 4576 cells/uL (ref 1500–7800)
Neutrophils Relative %: 78.9 %
Platelets: 248 10*3/uL (ref 140–400)
RBC: 4.19 10*6/uL (ref 3.80–5.10)
RDW: 11.8 % (ref 11.0–15.0)
Total Lymphocyte: 11.4 %
WBC: 5.8 10*3/uL (ref 3.8–10.8)

## 2019-06-22 LAB — COMPREHENSIVE METABOLIC PANEL
AG Ratio: 1.5 (calc) (ref 1.0–2.5)
ALT: 21 U/L (ref 6–29)
AST: 17 U/L (ref 10–35)
Albumin: 3.9 g/dL (ref 3.6–5.1)
Alkaline phosphatase (APISO): 54 U/L (ref 37–153)
BUN: 21 mg/dL (ref 7–25)
CO2: 25 mmol/L (ref 20–32)
Calcium: 9.3 mg/dL (ref 8.6–10.4)
Chloride: 103 mmol/L (ref 98–110)
Creat: 0.89 mg/dL (ref 0.50–0.99)
Globulin: 2.6 g/dL (calc) (ref 1.9–3.7)
Glucose, Bld: 71 mg/dL (ref 65–99)
Potassium: 4.5 mmol/L (ref 3.5–5.3)
Sodium: 136 mmol/L (ref 135–146)
Total Bilirubin: 0.7 mg/dL (ref 0.2–1.2)
Total Protein: 6.5 g/dL (ref 6.1–8.1)

## 2019-06-22 LAB — LUPUS ANTICOAGULANT EVAL W/ REFLEX
PTT-LA Screen: 33 s (ref <=40)
dRVVT: 48 s — ABNORMAL HIGH (ref <=45)

## 2019-06-22 LAB — CARDIOLIPIN ANTIBODIES, IGM+IGG
Anticardiolipin IgG: <14 [GPL'U]
Anticardiolipin IgM: <12 [MPL'U]

## 2019-06-22 LAB — BETA-2 GLYCOPROTEIN ANTIBODIES
Beta-2 Glyco 1 IgA: <9 SAU (ref <=20)
Beta-2 Glyco 1 IgM: 9 SMU (ref <=20)
Beta-2 Glyco I IgG: <9 SGU (ref <=20)

## 2019-06-22 LAB — SJOGRENS SYNDROME-A EXTRACTABLE NUCLEAR ANTIBODY: SSA (Ro) (ENA) Antibody, IgG: <1 AI

## 2019-06-22 LAB — RFLX DRVVT CONFRIM: DRVVT CONFIRM: NEGATIVE

## 2019-06-22 LAB — ANA: Anti Nuclear Antibody (ANA): NEGATIVE

## 2019-06-22 NOTE — Progress Notes (Signed)
All the labs are within normal limits.  Lymphocyte count is low but is stable.

## 2019-06-29 DIAGNOSIS — M542 Cervicalgia: Secondary | ICD-10-CM | POA: Diagnosis not present

## 2019-06-29 DIAGNOSIS — M545 Low back pain: Secondary | ICD-10-CM | POA: Diagnosis not present

## 2019-06-29 DIAGNOSIS — M79605 Pain in left leg: Secondary | ICD-10-CM | POA: Diagnosis not present

## 2019-07-06 ENCOUNTER — Ambulatory Visit
Admission: RE | Admit: 2019-07-06 | Discharge: 2019-07-06 | Disposition: A | Discharge status: Home / Self Care | Payer: Medicare Other | Source: Ambulatory Visit | Attending: Obstetrics & Gynecology | Admitting: Obstetrics & Gynecology

## 2019-07-06 ENCOUNTER — Other Ambulatory Visit: Payer: Self-pay

## 2019-07-06 DIAGNOSIS — Z1231 Encounter for screening mammogram for malignant neoplasm of breast: Secondary | ICD-10-CM | POA: Diagnosis not present

## 2019-07-07 ENCOUNTER — Other Ambulatory Visit: Payer: Self-pay | Admitting: Obstetrics & Gynecology

## 2019-07-07 DIAGNOSIS — R928 Other abnormal and inconclusive findings on diagnostic imaging of breast: Secondary | ICD-10-CM

## 2019-07-07 MED FILL — SUMATRIPTAN SUCC 100 MG TAB: 100 | 23 days supply | Qty: 9 | Fill #1

## 2019-07-07 MED FILL — TRIAMCINOLONE 0.5% CREAM: 0.5 | 5 days supply | Qty: 15 | Fill #1

## 2019-07-07 MED FILL — METHOCARBAMOL 500 MG TABS: 500 | 7 days supply | Qty: 30 | Fill #2

## 2019-07-07 MED FILL — BUTALB-ACETAMIN-CAFF 50-325: 50-325-40 | 10 days supply | Qty: 60 | Fill #1

## 2019-07-07 MED FILL — ALPRAZolam 0.25 MG TABS: 0.25 | 30 days supply | Qty: 30 | Fill #1

## 2019-07-08 NOTE — Progress Notes (Signed)
Office Visit Note  Patient: Jenny Bradford             Date of Birth: 29-Jan-1951           MRN: [id number]             PCP: [name], FNP Referring: [name], FNP Visit Date: 07/12/2019 Occupation: @GUAROCC @  Subjective:  Discuss lab work   History of Present Illness: LATIERRA Bradford is a 69 y.o. female with history of positive ANA and osteoarthritis.  Patient presents today to discuss recent lab work obtained on 06/18/2019.  She denies any new clinical features of autoimmune disease.  She has not had any recent rashes, oral or nasal ulcerations, sicca symptoms, symptoms of Raynaud's, photosensitivity, hair loss, enlarged lymph nodes,or shortness of breath.  She continues to see Dr. Melvyn Novas on a yearly basis to review yearly chest CTs.  She continues to have chronic pain in the right knee joint.  She is followed by Erlinda Hong and had Visco gel injections in November 2021.  She states that she has significant pain during the procedure and did not notice any improvement.  She has difficulty climbing steps and getting up from a seated position due to the discomfort.  She is concerned about falling.  She continues to experience bilateral [facility] joint discomfort.  She has not needed to use the braces recently.  She has noticed decreased grip strength. She started taking aspirin 81 mg 1 tablet by mouth daily per recommendations of Dr. Estanislado Pandy after [place] lab work on 03/23/19.   Activities of Daily Living:  Patient reports morning stiffness for 10-15 minutes.   Patient Reports nocturnal pain.  Difficulty dressing/grooming: Denies Difficulty climbing stairs: Reports Difficulty getting out of chair: Reports Difficulty using hands for taps, buttons, cutlery, and/or writing: Reports  Review of Systems  Constitutional: Positive for fatigue.  HENT: Negative for mouth sores, mouth dryness and nose dryness.   Eyes: Negative for pain, itching, visual disturbance and dryness.  Respiratory:  Negative for cough, hemoptysis, shortness of breath and difficulty breathing.   Cardiovascular: Negative for chest pain, palpitations, hypertension and swelling in legs/feet.  Gastrointestinal: Positive for constipation. Negative for blood in stool and diarrhea.  Endocrine: Negative for increased urination.  Genitourinary: Negative for difficulty urinating and painful urination.  Musculoskeletal: Positive for arthralgias, joint pain and morning stiffness. Negative for joint swelling, myalgias, muscle weakness, muscle tenderness and myalgias.  Skin: Negative for color change, pallor, rash, hair loss, nodules/bumps, redness, skin tightness, ulcers and sensitivity to sunlight.  Allergic/Immunologic: Negative for susceptible to infections.  Neurological: Negative for dizziness, numbness, headaches, memory loss and weakness.  Hematological: Negative for bruising/bleeding tendency and swollen glands.  Psychiatric/Behavioral: Negative for depressed mood, confusion and sleep disturbance. The patient is not nervous/anxious.     PMFS History:  Patient Active Problem List   Diagnosis Date Noted  . Primary osteoarthritis of left knee 01/26/2019  . Primary osteoarthritis of right knee 01/26/2019  . Palpitations 09/04/2017  . Family history of coronary artery disease in father 09/04/2017  . Paraesophageal hernia 05/13/2017  . Neck pain 01/03/2017  . Large hiatal hernia 12/28/2016  . Multiple pulmonary nodules determined by computed tomography of lung 12/27/2016  . Arthritis of carpometacarpal [facility]) joint of left thumb 05/28/2016  . OSA (obstructive sleep apnea) 10/05/2012  . HTN (hypertension) 10/05/2012  . Hyperlipidemia 10/05/2012  . Migraine headache 10/05/2012    Past Medical History:  Diagnosis Date  . AAA (abdominal aortic  aneurysm) ([place]) 08/2014   scanning every 2 years  . Arthritis    oa  . Complication of anesthesia    slow to awaken in past  . Family history of anesthesia  complication    slow to awaken  . GERD (gastroesophageal reflux disease)   . Grade II diastolic dysfunction [id number]   Noted on ECHO  . H/O hiatal hernia   . Headache(784.0)    migraines (rare)  . Hemorrhoids   . Hyperlipidemia   . Hypertension 04/01/11   ECHO-EF>55% NUC STRESS TEST- 05/01/12  . Hypothyroidism   . Mild aortic valve regurgitation 04/2017   Noted on ECHO  . Mitral valve regurgitation 04/2017   Mild: Noted on ECHO  . Pulmonary nodules 2018   scanning every 6 months  . Sleep apnea 05/01/12 & 05/29/12   SLEEP STUDY-[facility], NO CPAP USED SINCE MAY 2015 Uses oral device    Family History  Problem Relation Age of Onset  . Breast cancer Mother   . Ovarian cancer Mother   . Rheum arthritis Mother   . Heart attack Father   . Heart disease Father   . Hypertension Brother   . Heart disease Brother   . Hyperlipidemia Maternal Grandmother   . Hypertension Maternal Grandmother   . Polymyalgia rheumatica Paternal Aunt   . Hypertension Son   . Migraines Daughter   . Raynaud syndrome Daughter    Past Surgical History:  Procedure Laterality Date  . ANKLE SURGERY Right 1980  . CARPOMETACARPEL SUSPENSION PLASTY Right 12/14/2014   Procedure: RIGHT THUMB CARPOMETACARPAL ([place]) ARTHROPLASTY;  Surgeon: Leandrew Koyanagi, MD;  Location: [place];  Service: Orthopedics;  Laterality: Right;  . CARPOMETACARPEL SUSPENSION PLASTY Left 03/19/2017   Procedure: Left Thumb Ligament Reconstruction and Tendon Interposition;  Surgeon: Leandrew Koyanagi, MD;  Location: [place];  Service: Orthopedics;  Laterality: Left;  . COLONOSCOPY WITH PROPOFOL N/A 12/07/2013   Procedure: COLONOSCOPY WITH PROPOFOL;  Surgeon: Garlan Fair, MD;  Location: [facility];  Service: Endoscopy;  Laterality: N/A;  . CYSTECTOMY  1975  . FOOT SURGERY Right 9.23.14  . FOOT SURGERY Left 10.01.13  . [place]  2005  . HERNIA REPAIR  2019  . Miscarriage  8  .  TONSILLECTOMY  1964   Social History   Social History Narrative  . Not on file   Immunization History  Administered Date(s) Administered  . Influenza Whole 11/09/2016  . Influenza,inj,quad, With Preservative 12/15/2013  . Influenza-Unspecified 11/10/2014, 11/09/2017  . Zoster Recombinat (Shingrix) 05/12/2018     Objective: Vital Signs: BP 124/77 (BP Location: Left Arm, Patient Position: Sitting, Cuff Size: Normal)   Pulse 63   Resp 14   Ht 5\' 2"  (1.575 m)   Wt 167 lb (75.8 kg)   BMI 30.54 kg/m    Physical Exam Vitals and nursing note reviewed.  Constitutional:      Appearance: She is well-developed.  HENT:     Head: Normocephalic and atraumatic.  Eyes:     Conjunctiva/sclera: Conjunctivae normal.  Cardiovascular:     Rate and Rhythm: Normal rate and regular rhythm.     Heart sounds: Normal heart sounds.  Pulmonary:     Effort: Pulmonary effort is normal.     Breath sounds: Normal breath sounds.  Abdominal:     General: Bowel sounds are normal.     Palpations: Abdomen is soft.  Musculoskeletal:     Cervical back: Normal range of motion.  Lymphadenopathy:     Cervical: No cervical adenopathy.  Skin:    General: Skin is warm and dry.     Capillary Refill: Capillary refill takes less than 2 seconds.  Neurological:     Mental Status: She is alert and oriented to person, place, and time.  Psychiatric:        Behavior: Behavior normal.      Musculoskeletal Exam: C-spine good range of motion.  Postural thoracic kyphosis noted.  Shoulder joints, elbow joints, wrist joints, MCPs, PIPs and DIPs good range of motion no synovitis.  PIP and DIP thickening consistent with osteoarthritis of both hands.  Hip joints have good range of motion with no discomfort at this time.  No tenderness over trochanteric bursa currently.  Right knee has painful range of motion.  Warmth of the right knee joint on exam.  Left knee has good range of motion no warmth or effusion.  Ankle joints have  good range of motion no tenderness.  She has pedal edema bilaterally.  CDAI Exam: CDAI Score: -- Patient Global: --; Provider Global: -- Swollen: --; Tender: -- Joint Exam 07/12/2019   No joint exam has been documented for this visit   There is currently no information documented on the homunculus. Go to the Rheumatology activity and complete the homunculus joint exam.  Investigation: No additional findings.  Imaging: MM 3D SCREEN BREAST BILATERAL  Result Date: 07/06/2019 CLINICAL DATA:  Screening. EXAM: DIGITAL SCREENING BILATERAL MAMMOGRAM WITH TOMO AND CAD COMPARISON:  Previous exam(s). ACR Breast Density Category a: The breast tissue is almost entirely fatty. FINDINGS: In the left breast, a possible asymmetry warrants further evaluation. In the right breast, no findings suspicious for malignancy. Images were processed with CAD. IMPRESSION: Further evaluation is suggested for possible asymmetry in the left breast. RECOMMENDATION: Diagnostic mammogram and possibly ultrasound of the left breast. (Code:[id number]) The patient will be contacted regarding the findings, and additional imaging will be scheduled. BI-RADS CATEGORY  0: Incomplete. Need additional imaging evaluation and/or prior mammograms for comparison. Electronically Signed   By: Kristopher Oppenheim M.D.   On: 07/06/2019 12:59    Recent Labs: Lab Results  Component Value Date   WBC 5.8 06/18/2019   HGB 13.6 06/18/2019   PLT 248 06/18/2019   NA 136 06/18/2019   K 4.5 06/18/2019   CL 103 06/18/2019   CO2 25 06/18/2019   GLUCOSE 71 06/18/2019   BUN 21 06/18/2019   CREATININE 0.89 06/18/2019   BILITOT 0.7 06/18/2019   ALKPHOS 71 11/23/2018   AST 17 06/18/2019   ALT 21 06/18/2019   PROT 6.5 06/18/2019   ALBUMIN 4.0 11/23/2018   CALCIUM 9.3 06/18/2019   GFRAA 74 11/23/2018    Speciality Comments: No specialty comments available.  Procedures:  No procedures performed Allergies: Midol [aspirin-cinnamedrine-caffeine],  Olmesartan, Talwin [pentazocine], Bisoprolol, and Naproxen   Assessment / Plan:     Visit Diagnoses: Positive ANA (antinuclear antibody) - 03/23/19 AVISE lupus index -0.1, ANA 1: 640 centromere, anti-Ro 7 (equivocal), anticardiolipin IgG 38, antibeta-2 GP 1 IgG 24, antithyroglobulin+, anti-TPO+.  The repeat labs done recently showed negative ANA, negative Ro, negative anticardiolipin, negative beta-2 GP 1 and negative lupus anticoagulant.  I am uncertain about the etiology of initial positive serology which could be due to stress or some infection.  Her serology is completely negative.  I also reassured her that she does not need any treatment at this time.  She may discontinue aspirin.  If she develops any new symptoms  she should notify us.  Patient has low lymphocyte count for the last 1 year.  It is a still within normal limits.  She will be having repeat labs with her PCP this summer.  Other fatigue-improved.  Shortness of breath - CT on 09/07/2018 for yearly surveillance of a pulmonary nodule.  Results revealed groundglass in part solid nodules in the lungs bilaterally-stable. Dr. Melvyn Novas  DDD (degenerative disc disease), cervical-she has chronic discomfort.  Arthritis of carpometacarpal ([facility]) joint of left thumb-she is aware of joint protection.  Primary osteoarthritis of both knees-she has discomfort off and on.  Essential hypertension-her blood pressure was well controlled today.  Other medical problems are listed as follows:  History of hyperlipidemia  Hx of migraines  OSA (obstructive sleep apnea)  Paraesophageal hernia  Family history of rheumatoid arthritis  Family history of Raynaud's phenomenon  Family history of polymyalgia rheumatica  Family history of coronary artery disease in father  Orders: No orders of the defined types were placed in this encounter.  No orders of the defined types were placed in this encounter.    Follow-Up Instructions: Return in about 1  year (around 07/11/2020) for Osteoarthritis, +ANA.   Bo Merino, MD  Note - This record has been created using Editor, commissioning.  Chart creation errors have been sought, but may not always  have been located. Such creation errors do not reflect on  the standard of medical care.

## 2019-07-12 ENCOUNTER — Ambulatory Visit (INDEPENDENT_AMBULATORY_CARE_PROVIDER_SITE_OTHER): Payer: Medicare Other | Admitting: Rheumatology

## 2019-07-12 ENCOUNTER — Encounter: Payer: Self-pay | Admitting: Rheumatology

## 2019-07-12 ENCOUNTER — Other Ambulatory Visit: Payer: Self-pay

## 2019-07-12 VITALS — BP 124/77 | HR 63 | Resp 14 | Ht 62.0 in | Wt 167.0 lb

## 2019-07-12 DIAGNOSIS — R0602 Shortness of breath: Secondary | ICD-10-CM

## 2019-07-12 DIAGNOSIS — R768 Other specified abnormal immunological findings in serum: Secondary | ICD-10-CM

## 2019-07-12 DIAGNOSIS — M170 Bilateral primary osteoarthritis of knee: Secondary | ICD-10-CM

## 2019-07-12 DIAGNOSIS — Z8639 Personal history of other endocrine, nutritional and metabolic disease: Secondary | ICD-10-CM

## 2019-07-12 DIAGNOSIS — R5383 Other fatigue: Secondary | ICD-10-CM | POA: Diagnosis not present

## 2019-07-12 DIAGNOSIS — I10 Essential (primary) hypertension: Secondary | ICD-10-CM | POA: Diagnosis not present

## 2019-07-12 DIAGNOSIS — M1812 Unilateral primary osteoarthritis of first carpometacarpal joint, left hand: Secondary | ICD-10-CM

## 2019-07-12 DIAGNOSIS — Z8261 Family history of arthritis: Secondary | ICD-10-CM | POA: Diagnosis not present

## 2019-07-12 DIAGNOSIS — K449 Diaphragmatic hernia without obstruction or gangrene: Secondary | ICD-10-CM | POA: Diagnosis not present

## 2019-07-12 DIAGNOSIS — M5030 Other cervical disc degeneration, unspecified cervical region: Secondary | ICD-10-CM | POA: Diagnosis not present

## 2019-07-12 DIAGNOSIS — Z8249 Family history of ischemic heart disease and other diseases of the circulatory system: Secondary | ICD-10-CM

## 2019-07-12 DIAGNOSIS — Z8269 Family history of other diseases of the musculoskeletal system and connective tissue: Secondary | ICD-10-CM

## 2019-07-12 DIAGNOSIS — G4733 Obstructive sleep apnea (adult) (pediatric): Secondary | ICD-10-CM

## 2019-07-12 DIAGNOSIS — Z8669 Personal history of other diseases of the nervous system and sense organs: Secondary | ICD-10-CM | POA: Diagnosis not present

## 2019-07-12 DIAGNOSIS — R7689 Other specified abnormal immunological findings in serum: Secondary | ICD-10-CM

## 2019-07-13 DIAGNOSIS — M545 Low back pain: Secondary | ICD-10-CM | POA: Diagnosis not present

## 2019-07-13 DIAGNOSIS — M79605 Pain in left leg: Secondary | ICD-10-CM | POA: Diagnosis not present

## 2019-07-13 DIAGNOSIS — M542 Cervicalgia: Secondary | ICD-10-CM | POA: Diagnosis not present

## 2019-07-14 ENCOUNTER — Other Ambulatory Visit: Payer: Self-pay

## 2019-07-14 ENCOUNTER — Ambulatory Visit
Admission: RE | Admit: 2019-07-14 | Discharge: 2019-07-14 | Disposition: A | Discharge status: Home / Self Care | Payer: Medicare Other | Source: Ambulatory Visit | Attending: Obstetrics & Gynecology | Admitting: Obstetrics & Gynecology

## 2019-07-14 DIAGNOSIS — N6324 Unspecified lump in the left breast, lower inner quadrant: Secondary | ICD-10-CM | POA: Diagnosis not present

## 2019-07-14 DIAGNOSIS — R928 Other abnormal and inconclusive findings on diagnostic imaging of breast: Secondary | ICD-10-CM | POA: Diagnosis not present

## 2019-07-15 MED FILL — ALPRAZolam 0.25 MG TABS: 0.25 | 30 days supply | Qty: 30 | Fill #0

## 2019-07-15 MED FILL — LEVOTHYROXINE 50 MCG TABLET: 50 | 30 days supply | Qty: 30 | Fill #0

## 2019-07-15 MED FILL — ESTRADIOL 0.1 MG/GM CRM: 0.1 | 90 days supply | Qty: 43 | Fill #0

## 2019-07-16 ENCOUNTER — Other Ambulatory Visit: Payer: Medicare Other

## 2019-07-19 DIAGNOSIS — M545 Low back pain: Secondary | ICD-10-CM | POA: Diagnosis not present

## 2019-07-19 DIAGNOSIS — M542 Cervicalgia: Secondary | ICD-10-CM | POA: Diagnosis not present

## 2019-07-19 DIAGNOSIS — M79605 Pain in left leg: Secondary | ICD-10-CM | POA: Diagnosis not present

## 2019-08-10 ENCOUNTER — Telehealth: Payer: Self-pay | Admitting: Cardiovascular Disease

## 2019-08-10 NOTE — Telephone Encounter (Signed)
Patient states she is returning a call, received on 08/04/19. However, no current notes or results are available. She states she does not know what the phone call may have been in regards to. I made her aware that Dr Claiborne Billings does not have any availability for a date sooner than 10/08/19.

## 2019-08-30 ENCOUNTER — Encounter: Payer: Self-pay | Admitting: Cardiovascular Disease

## 2019-08-30 ENCOUNTER — Other Ambulatory Visit: Payer: Self-pay

## 2019-08-30 ENCOUNTER — Ambulatory Visit (INDEPENDENT_AMBULATORY_CARE_PROVIDER_SITE_OTHER): Payer: Medicare Other | Admitting: Cardiovascular Disease

## 2019-08-30 VITALS — BP 126/84 | HR 55 | Ht 63.0 in | Wt 160.4 lb

## 2019-08-30 DIAGNOSIS — E7800 Pure hypercholesterolemia, unspecified: Secondary | ICD-10-CM | POA: Diagnosis not present

## 2019-08-30 DIAGNOSIS — I10 Essential (primary) hypertension: Secondary | ICD-10-CM

## 2019-08-30 DIAGNOSIS — I5189 Other ill-defined heart diseases: Secondary | ICD-10-CM

## 2019-08-30 DIAGNOSIS — I519 Heart disease, unspecified: Secondary | ICD-10-CM

## 2019-08-30 DIAGNOSIS — G4733 Obstructive sleep apnea (adult) (pediatric): Secondary | ICD-10-CM | POA: Diagnosis not present

## 2019-08-30 DIAGNOSIS — K449 Diaphragmatic hernia without obstruction or gangrene: Secondary | ICD-10-CM

## 2019-08-30 DIAGNOSIS — E039 Hypothyroidism, unspecified: Secondary | ICD-10-CM

## 2019-08-30 NOTE — Progress Notes (Signed)
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  I had cared for her father [name] for many years who ultimately died on 2014-02-12 at [facility].  She established cardiology care with me in February 2019.  I last saw her in December 2020.  She presents for a 7 month follow-up evaluation.   Ms. Kramme has a history of hypertension for many years.  Most recently, she has been on losartan 75 mg daily in addition to HCTZ 25 mg.  She also has a history of hypothyroidism and has been on levothyroxine at 50 g.  There is a history of hyperlipidemia and she has been on pravastatin 40 mg.  Her father had a history of significant abdominal aortic aneurysm.  She underwent abdominal aortic aneurysm screening and there was no significant aortic dilatation and instructed with a maximum aortic dimension at 2.3 cm.  She has a history of obstructive sleep apnea which was diagnosed in 2014 with an AHI of 14 per hour and RDI of 16.5 per hour.  She had significant oxygen desaturation to a nadir of 81% with rems sleep and had loud snoring.  Apparently should use CPAP for a short while but ultimately became intolerant was ultimately transitioned to an oral appliance for which she is followed by [name].   When I saw her in February 2019 she had begun to notice that despite taking medication for her blood pressure, her blood pressure elevated.  She was walking 4 days per week for 30 minutes at a time.  She denied any exertionally precipitation of chest pain.  She was unaware of any significant valvular abnormality, although her mother has aortic stenosis.  She also has a very large hiatal hernia and has seen Dr. [name] and was told that in the future she may  require surgery.  Shehe underwent surgery for paraesophageal hernia repair with a Nissen fundoplication in March 6644.  She also has had progressive difficulty with osteoarthritis involving her neck, back, hands, feet, and feet.  He has not been able to exercise as regularly.  She is no longer working since she is unable to stand up the entire day.  She now sees [name] for primary care.  She has been caring for her husband who is permanently disabled since age 36.    When I last saw her on December 08, 2017 as result of the generic impurities I had recommended she change from losartan 100 mg daily to olmesartan 40 mg.  Her lipid studies were not optimal on pravastatin and I suggested discontinuance of this and in its place started atorvastatin 40 mg.  An echo Doppler study in February 2019 showed an EF of 60 to 65% with grade 2 diastolic dysfunction, mild AR and mild MR.  An event monitor had shown sinus rhythm with low average heart rate at 50 and high average heart rate at 70 bpm.  She had several atrial couplets and one atrial triplet.  There were no episodes of atrial fibrillation.  There was no ventricular ectopy or pauses.  I saw her in March 2020 after she had called the office and wished to be seen sooner than her scheduled  appointment due to progressive fatigue and particularly shortness of breath particularly while climbing steps.  She had been self adjusting some of her medications and was no longer on olmesartan but has been on valsartan 160 mg.  However she felt better when she cuts this in half and had been taking 80 mg twice a day.  Denied any chest pain and was walking 1-1 and half miles per day.  She was only using CPAP intermittently and had adamantly using a customized oral appliance.   She was evaluated in a telemedicine visit in June 2020.  At that time she was  on candesartan for ARB therapy.  Her dose was further titrated to 60 mg in the morning and 8 mg in the evening.  She was  on atorvastatin 40 mg on Monday Wednesday and Friday for hyperlipidemia.  She was using an oral appliance for obstructive sleep apnea from [name].  An echo Doppler study from August 13, 2018 showed normal systolic function with EF at 55 to 60%.  There was mild aortic sclerosis with mild AR.  The aortic root and ascending aorta and aortic arch were normal in dimensions.  I last saw her on December 07, 2018 which time she was complaining of some muscle aching.  As result she stopped taking atorvastatin several weeks prior to that evaluation.  Previously she felt she tolerated pravastatin better.  She had noticed fatigue and at times low blood pressures.  She will be undergoing a hysterectomy in late January.  I reviewed laboratory from November 23, 2018.  BUN 13 creatinine 0.94.  LFTs normal.  Stable CBC.  Compared to March 2020, cholesterol increased from 140 to162.  LDL increased from 57 while on atorvastatin daily to 84 on her reduced dose.  During that evaluation, recommended institution of Zetia 10 mg.  2 weeks thereafter I recommended resumption of pravastatin which she had tolerated better than atorvastatin.  I also checked a CPK and erythrocyte sedimentation rate which were normal arguing against myopathy or rhabdomyolysis.  She was using her oral appliance for obstructive sleep apnea.  I last saw her in December 2020 and over the several months prior to that evaluation she denied lany episodes of chest tightness or pressure.  She was contemplating whether or not to go through her elective neurectomy and pelvic floor reconstruction January with the spike in COVID-19 this is to be done by a urogynecologist at [facility].  She admits to fatigue.  She has not had recent laboratory on Zetia and and pravastatin which she has been taking 20 mg.  She continued to be on candesartan 16 mg, spironolactone 12.5 mg for hypertension and  levothyroxine 50 mcg for hypothyroidism.  During that evaluation she  continued to have some fatigue but admitted that she was not consistently using her oral appliance.  Since her last evaluation, she apparently was found to have positive ANA and was seen by Dr. Patrecia Pour.  She was started on aspirin therapy and subsequent laboratory showed improvement of inflammatory markers.  Subsequently she felt better.  She was never started on immunosuppressive therapy.  She has been wearing a knee brace and is followed by Dr. Erlinda Hong of orthopedics.  Most recently she did not tolerate a trial of atorvastatin for more aggressive lipid-lowering and resume taking pravastatin 20 mg in addition to Zetia for hyperlipidemia.  Her grandchild was born 21 weeks ago and she is now scheduled for her elective hysterectomy to be done in August 2021.  Past Medical History:  Diagnosis Date  . AAA (abdominal aortic aneurysm) ([place]) 08/2014   scanning every 2 years  . Arthritis    oa  . Complication of anesthesia    slow to awaken in past  . Family history of anesthesia complication    slow to awaken  . GERD (gastroesophageal reflux disease)   . Grade II diastolic dysfunction 87/5643   Noted on ECHO  . H/O hiatal hernia   . Headache(784.0)    migraines (rare)  . Hemorrhoids   . Hyperlipidemia   . Hypertension 04/01/11   ECHO-EF>55% NUC STRESS TEST- 05/01/12  . Hypothyroidism   . Mild aortic valve regurgitation 04/2017   Noted on ECHO  . Mitral valve regurgitation 04/2017   Mild: Noted on ECHO  . Pulmonary nodules 2018   scanning every 6 months  . Sleep apnea 05/01/12 & 05/29/12   SLEEP STUDY-[place] [facility], NO CPAP USED SINCE MAY 2015 Uses oral device    Past Surgical History:  Procedure Laterality Date  . ANKLE SURGERY Right 1980  . CARPOMETACARPEL SUSPENSION PLASTY Right 12/14/2014   Procedure: RIGHT THUMB CARPOMETACARPAL ([place]) ARTHROPLASTY;  Surgeon: Leandrew Koyanagi, MD;  Location: [place];  Service: Orthopedics;  Laterality: Right;  . CARPOMETACARPEL  SUSPENSION PLASTY Left 03/19/2017   Procedure: Left Thumb Ligament Reconstruction and Tendon Interposition;  Surgeon: Leandrew Koyanagi, MD;  Location: [place];  Service: Orthopedics;  Laterality: Left;  . COLONOSCOPY WITH PROPOFOL N/A 12/07/2013   Procedure: COLONOSCOPY WITH PROPOFOL;  Surgeon: Garlan Fair, MD;  Location: [facility];  Service: Endoscopy;  Laterality: N/A;  . CYSTECTOMY  1975  . FOOT SURGERY Right 9.23.14  . FOOT SURGERY Left 10.01.13  . [place]  2005  . HERNIA REPAIR  2019  . Miscarriage  82  . TONSILLECTOMY  1964    Current Medications: Outpatient Medications Prior to Visit  Medication Sig Dispense Refill  . acetaminophen (TYLENOL) 500 MG tablet Take 500 mg by mouth 2 (two) times daily.    Marland Kitchen ALPRAZolam (XANAX) 0.25 MG tablet Take 0.25 mg by mouth at bedtime as needed for sleep.     . butalbital-acetaminophen-caffeine (FIORICET, ESGIC) 50-325-40 MG per tablet Take 1 tablet by mouth 2 (two) times daily as needed for headache.    . candesartan (ATACAND) 16 MG tablet TAKE 1 TABLET BY MOUTH ONCE DAILY 90 tablet 1  . Cholecalciferol (VITAMIN D) 2000 units tablet Take 2,000 Units by mouth daily.    . diclofenac Sodium (VOLTAREN) 1 % GEL APPLY 2 GRAMS TOPICALLY 4 (FOUR) TIMES DAILY. 100 g 0  . diclofenac Sodium (VOLTAREN) 1 % GEL Apply 2 g topically 4 (four) times daily. 350 g 12  . Docusate Sodium 100 MG capsule Take 200 mg by mouth daily as needed for mild constipation. Takes 300 mg  as needed    . escitalopram (LEXAPRO) 20 MG tablet Take 10-20 mg by mouth every morning. Hot flashes    . estradiol (ESTRACE) 0.1 MG/GM vaginal cream Place 2 g vaginally 2 (two) times a week. USE TWICE WEEKLY    . ezetimibe (ZETIA) 10 MG tablet Take 1 tablet (10 mg total) by mouth daily. 90 tablet 3  . Glucosamine HCl (GLUCOSAMINE PO) Take by mouth daily.     . hydrocortisone 2.5 % cream Proctosol HC 2.5 % rectal cream with applicator    . levothyroxine (SYNTHROID,  LEVOTHROID) 50 MCG tablet Take 50 mcg by mouth daily before breakfast.    .  linaclotide (LINZESS) 72 MCG capsule 2 (two) times a week.     . pravastatin (PRAVACHOL) 40 MG tablet Take 0.5 tablets (20 mg total) by mouth every evening. 90 tablet 1  . simethicone (MYLICON) 80 MG chewable tablet Chew 1 tablet (80 mg total) by mouth every 6 (six) hours as needed for flatulence (bloating). 30 tablet 0  . spironolactone (ALDACTONE) 25 MG tablet TAKE 1/2 TABLET BY MOUTH DAILY, MAY USE AS NEEDED FOR SWELLING AS WELL 45 tablet 2  . SUMAtriptan (IMITREX) 50 MG tablet Take 50 mg by mouth every 2 (two) hours as needed for migraine.      No facility-administered medications prior to visit.     Allergies:   Midol [aspirin-cinnamedrine-caffeine], Olmesartan, Talwin [pentazocine], Bisoprolol, and Naproxen   Social History   Socioeconomic History  . Marital status: Married    Spouse name: Not on file  . Number of children: Not on file  . Years of education: Not on file  . Highest education level: Not on file  Occupational History  . Not on file  Tobacco Use  . Smoking status: Never Smoker  . Smokeless tobacco: Never Used  Vaping Use  . Vaping Use: Never used  Substance and Sexual Activity  . Alcohol use: No    Alcohol/week: 0.0 standard drinks  . Drug use: No  . Sexual activity: Not on file  Other Topics Concern  . Not on file  Social History Narrative  . Not on file   Social Determinants of Health   Financial Resource Strain:   . Difficulty of Paying Living Expenses:   Food Insecurity:   . Worried About Charity fundraiser in the Last Year:   . Arboriculturist in the Last Year:   Transportation Needs:   . Film/video editor (Medical):   Marland Kitchen Lack of Transportation (Non-Medical):   Physical Activity:   . Days of Exercise per Week:   . Minutes of Exercise per Session:   Stress:   . Feeling of Stress :   Social Connections:   . Frequency of Communication with Friends and Family:   .  Frequency of Social Gatherings with Friends and Family:   . Attends Religious Services:   . Active Member of Clubs or Organizations:   . 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  No tobacco history.  Family History:  The patient's family history includes Breast cancer in her mother; Heart attack in her father; Heart disease in her brother and father; Hyperlipidemia in her maternal grandmother; Hypertension in her brother, maternal grandmother, and son; Migraines in her daughter; Ovarian cancer in her mother; Polymyalgia rheumatica in her paternal aunt; Raynaud syndrome in her daughter; Rheum arthritis in her mother.   ROS General: Negative; No fevers, chills, or night sweats; increasing fatigue HEENT: Negative; No changes in vision or hearing, sinus congestion, difficulty swallowing Pulmonary: Negative; No cough, wheezing, shortness of breath, hemoptysis Cardiovascular: See history of present illness, no chest pain, PND, orthopnea. GI: Positive for large hiatal hernia; is post Nissan fundoplication surgery GU: For elective hysterectomy and pelvic floor reconstruction Musculoskeletal: Positive for right knee discomfort wearing a right knee brace Hematologic/Oncology: Negative; no easy bruising, bleeding Endocrine: Negative; no heat/cold intolerance; no diabetes Neuro: Negative; no changes in balance, headaches Skin: Negative; No rashes or skin lesions Psychiatric: Negative; No behavioral problems, depression Sleep: Positive  for OSA, now with a customized oral appliance with mandibular advancement; No snoring, daytime sleepiness, hypersomnolence, bruxism, restless legs, hypnogognic hallucinations, no cataplexy Other comprehensive 14 point system review is negative.   PHYSICAL EXAM:   VS:  BP 126/84   Pulse (!) 55   Ht _0  (1.6 m)   Wt 160 lb 6.4 oz (72.8 kg)   BMI 28.41 kg/m     Repeat  blood pressure by me 136/84  Wt Readings from Last 3 Encounters:  08/30/19 160 lb 6.4 oz (72.8 kg)  07/12/19 167 lb (75.8 kg)  04/13/19 169 lb (76.7 kg)    General: Alert, oriented, no distress.  Skin: normal turgor, no rashes, warm and dry HEENT: Normocephalic, atraumatic. Pupils equal round and reactive to light; sclera anicteric; extraocular muscles intact; Nose without nasal septal hypertrophy Mouth/Parynx benign; Mallinpatti scale 3 Neck: No JVD, no carotid bruits; normal carotid upstroke Lungs: clear to ausculatation and percussion; no wheezing or rales Chest wall: without tenderness to palpitation Heart: PMI not displaced, RRR, s1 s2 normal, 1/6 systolic murmur, no diastolic murmur, no rubs, gallops, thrills, or heaves Abdomen: soft, nontender; no hepatosplenomehaly, BS+; abdominal aorta nontender and not dilated by palpation. Back: no CVA tenderness Pulses 2+ Musculoskeletal: Right knee brace in place;  Extremities: no clubbing cyanosis or edema, Homan's sign negative  Neurologic: grossly nonfocal; Cranial nerves grossly wnl Psychologic: Normal mood and affect    Studies/Labs Reviewed:   ECG (independently read by me): Sinus bradycardia 55 bpm.  No ectopy.  Normal intervals.  March 02, 2019 ECG (independently read by me): Normal sinus rhythm at 65 bpm.  No ectopy.  Normal intervals.  No ST segment changes.    September 2020 ECG (independently read by me): Normal sinus rhythm at 61 bpm.  Normal intervals.  No ectopy.  March 2020 ECG (independently read by me): Normal sinus rhythm at 69 bpm.  Poor anterior R wave progression V1 through V3.  December 08, 2017 ECG (independently read by me): NSR at 66; normal intervals  04/21/2017 ECG (independently read by me): Normal sinus rhythm at 66 bpm.  No ST segment changes.  Normal intervals.  No ectopy.  Very mild RV conduction delay.  Recent Labs: BMP Latest Ref Rng & Units 06/18/2019 11/23/2018 06/04/2018  Glucose 65 - 99  mg/dL 71 80 88  BUN 7 - 25 mg/dL _1 Creatinine 0.50 - 0.99 mg/dL 0.89 0.94 0.89  BUN/Creat Ratio 6 - 22 (calc) NOT APPLICABLE 14 18  Sodium 135 - 146 mmol/L 136 137 141  Potassium 3.5 - 5.3 mmol/L 4.5 4.6 4.7  Chloride 98 - 110 mmol/L 103 103 103  CO2 20 - 32 mmol/L _2 Calcium 8.6 - 10.4 mg/dL 9.3 9.4 9.3     Hepatic Function Latest Ref Rng & Units 06/18/2019 11/23/2018 06/04/2018  Total Protein 6.1 - 8.1 g/dL 6.5 6.6 6.6  Albumin 3.8 - 4.8 g/dL - 4.0 4.1  AST 10 - 35 U/L _3 ALT 6 - 29 U/L 21 23 35(H)  Alk Phosphatase 39 - 117 IU/L - 71 94  Total Bilirubin 0.2 - 1.2 mg/dL 0.7 0.5 0.5    CBC Latest Ref Rng & Units 06/18/2019 11/23/2018 05/14/2017  WBC 3.8 - 10.8 Thousand/uL 5.8 5.0 16.4(H)  Hemoglobin 11.7 - 15.5 g/dL 13.6 13.7 11.5(L)  Hematocrit 35 - 45 % 41.0 40.1 34.7(L)  Platelets 140 - 400 Thousand/uL 248 273 215   Lab Results  Component Value  Date   MCV 97.9 06/18/2019   MCV 97 11/23/2018   MCV 96.7 05/14/2017   Lab Results  Component Value Date   TSH 1.830 11/23/2018   No results found for: HGBA1C   BNP    Component Value Date/Time   BNP 30.6 11/23/2018 0918    ProBNP No results found for: PROBNP   Lipid Panel     Component Value Date/Time   CHOL 162 11/23/2018 0918   TRIG 147 11/23/2018 0918   HDL 53 11/23/2018 0918   CHOLHDL 3.1 11/23/2018 0918   LDLCALC 84 11/23/2018 0918     RADIOLOGY: No results found.   Additional studies/ records that were reviewed today include:  I reviewed the patient's prior evaluation at the [facility] in 2014.  following a sleep study.  I reviewed her imaging studies, and prior evaluation by Dr. Melvyn Novas.  I reviewed her rheumatology evaluation from April 13, 2019   ASSESSMENT:    1. Essential hypertension   2. Pure hypercholesterolemia   3. Grade II diastolic dysfunction   4. OSA (obstructive sleep apnea)   5. Hypothyroidism, unspecified type   6. Large hiatal hernia  s/p Nissan plication     PLAN:  Ms. 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  Remotely she had been on losartan for hypertension and pravastatin for hyperlipidemia.  Over the last several years her medications have been adjusted due to ARB impurities and also for mild orthostatic symptoms.  She has documented normal systolic function with grade 2 diastolic dysfunction.  An attempt to be more aggressive with lipid management she was given a trial of atorvastatin but apparently did not tolerate this secondary to myalgias and is back on pravastatin at 20 mg with concomitant Zetia.  Blood pressure today is controlled with candesartan 16 mg in addition to spironolactone 12.5 mg daily.  She continues to be on levothyroxine for hypothyroidism currently at 50 mcg.  Since I saw her she underwent rheumatologic evaluation with Dr. Patrecia Pour.  Apparently her initial positive antibody titer of 1-3 20 with a nuclear centromere pattern currently normalized following initiation of just baby aspirin 81 mg.  She has not had any subsequent increased markers of inflammation and never received immunosuppressive therapy.  Follow-up LFTs were normal in April 2021.  Her daughter recently had a baby 4 weeks ago in [place] and for this reason she had deferred her urogynecological surgery which is now scheduled to be done electively in August 2021.   Medication Adjustments/Labs and Tests Ordered: Current medicines are reviewed at length with the patient today.  Concerns regarding medicines are outlined above.  Medication changes, Labs and Tests ordered today are listed in the Patient Instructions below. Patient Instructions  Medication Instructions:  CONTINUE WITH CURRENT MEDICATIONS. NO CHANGES.  *If you need a refill on your cardiac medications before your next appointment, please call your  pharmacy*   Lab Work: Fasting lipids  If you have labs (blood work) drawn today and your tests are completely normal, you will receive your results only by: Marland Kitchen MyChart Message (if you have MyChart) OR . A paper copy in the mail If you have any lab test that is abnormal or we need to change your treatment, we will call you to review the results.  Follow-Up: At [facility], you and your health needs are our priority.  As part  of our continuing mission to provide you with exceptional heart care, we have created designated Provider Care Teams.  These Care Teams include your primary Cardiologist (physician) and Advanced Practice Providers (APPs -  Physician Assistants and Nurse Practitioners) who all work together to provide you with the care you need, when you need it.  We recommend signing up for the patient portal called "MyChart".  Sign up information is provided on this After Visit Summary.  MyChart is used to connect with patients for Virtual Visits (Telemedicine).  Patients are able to view lab/test results, encounter notes, upcoming appointments, etc.  Non-urgent messages can be sent to your provider as well.   To learn more about what you can do with MyChart, go to [email or web address].    Your next appointment:   6 month(s)  The format for your next appointment:   In Person  Provider:   [name], MD        Signed, [name], MD  09/01/2019 3:10 PM    [place] [address] [place], [place], [place]  [address] Phone: [phone]

## 2019-08-30 NOTE — Patient Instructions (Signed)
Medication Instructions:  CONTINUE WITH CURRENT MEDICATIONS. NO CHANGES.  *If you need a refill on your cardiac medications before your next appointment, please call your pharmacy*   Lab Work: Fasting lipids  If you have labs (blood work) drawn today and your tests are completely normal, you will receive your results only by: Marland Kitchen MyChart Message (if you have MyChart) OR . A paper copy in the mail If you have any lab test that is abnormal or we need to change your treatment, we will call you to review the results.  Follow-Up: At [facility] you and your health needs are our priority.  As part of our continuing mission to provide you with exceptional heart care, we have created designated Provider Care Teams.  These Care Teams include your primary Cardiologist (physician) and Advanced Practice Providers (APPs -  Physician Assistants and Nurse Practitioners) who all work together to provide you with the care you need, when you need it.  We recommend signing up for the patient portal called "MyChart".  Sign up information is provided on this After Visit Summary.  MyChart is used to connect with patients for Virtual Visits (Telemedicine).  Patients are able to view lab/test results, encounter notes, upcoming appointments, etc.  Non-urgent messages can be sent to your provider as well.   To learn more about what you can do with MyChart, go to [email or web address].    Your next appointment:   6 month(s)  The format for your next appointment:   In Person  Provider:   [name], MD

## 2019-09-01 ENCOUNTER — Encounter: Payer: Self-pay | Admitting: Cardiovascular Disease

## 2019-09-03 ENCOUNTER — Other Ambulatory Visit: Payer: Self-pay | Admitting: Internal Medicine

## 2019-09-03 DIAGNOSIS — R918 Other nonspecific abnormal finding of lung field: Secondary | ICD-10-CM

## 2019-09-07 MED FILL — ALPRAZolam 0.25 MG TABS: 0.25 | 30 days supply | Qty: 30 | Fill #1

## 2019-09-07 MED FILL — EZETIMIBE 10 MG TABS: 10 | 90 days supply | Qty: 90 | Fill #3

## 2019-09-07 MED FILL — LEVOTHYROXINE 50 MCG TABLET: 50 | 30 days supply | Qty: 30 | Fill #1

## 2019-09-09 LAB — LIPID PANEL
Chol/HDL Ratio: 2.6 ratio (ref 0.0–4.4)
Cholesterol, Total: 147 mg/dL (ref 100–199)
HDL: 57 mg/dL (ref >39)
LDL Chol Calc (NIH): 67 mg/dL (ref 0–99)
Triglycerides: 135 mg/dL (ref 0–149)
VLDL Cholesterol Cal: 23 mg/dL (ref 5–40)

## 2019-09-16 ENCOUNTER — Ambulatory Visit
Admission: RE | Admit: 2019-09-16 | Discharge: 2019-09-16 | Disposition: A | Discharge status: Home / Self Care | Payer: Medicare Other | Source: Ambulatory Visit | Attending: Internal Medicine | Admitting: Internal Medicine

## 2019-09-16 DIAGNOSIS — R918 Other nonspecific abnormal finding of lung field: Secondary | ICD-10-CM

## 2019-09-28 DIAGNOSIS — M545 Low back pain: Secondary | ICD-10-CM | POA: Diagnosis not present

## 2019-09-28 DIAGNOSIS — M79605 Pain in left leg: Secondary | ICD-10-CM | POA: Diagnosis not present

## 2019-09-28 DIAGNOSIS — M542 Cervicalgia: Secondary | ICD-10-CM | POA: Diagnosis not present

## 2019-09-28 DIAGNOSIS — M25561 Pain in right knee: Secondary | ICD-10-CM | POA: Diagnosis not present

## 2019-10-05 DIAGNOSIS — M79605 Pain in left leg: Secondary | ICD-10-CM | POA: Diagnosis not present

## 2019-10-05 DIAGNOSIS — M545 Low back pain: Secondary | ICD-10-CM | POA: Diagnosis not present

## 2019-10-05 DIAGNOSIS — M542 Cervicalgia: Secondary | ICD-10-CM | POA: Diagnosis not present

## 2019-10-05 DIAGNOSIS — M25561 Pain in right knee: Secondary | ICD-10-CM | POA: Diagnosis not present

## 2019-10-07 MED FILL — BUTALB-ACETAMIN-CAFF 50-325: 50-325-40 | 10 days supply | Qty: 60 | Fill #2

## 2019-10-07 MED FILL — ESTRADIOL 0.1 MG/GM CREA: 0.1 | 90 days supply | Qty: 43 | Fill #1

## 2019-10-07 MED FILL — PRAVASTATIN NA 40 MG TAB: 40 | 90 days supply | Qty: 45 | Fill #3

## 2019-10-07 MED FILL — ESCITALOPRAM 20 MG TABLET: 20 | 90 days supply | Qty: 90 | Fill #3

## 2019-10-07 MED FILL — CANDESARTAN CILEXETIL 16 MG: 16 | 90 days supply | Qty: 90 | Fill #1

## 2019-10-08 ENCOUNTER — Ambulatory Visit: Payer: Medicare Other | Admitting: Cardiovascular Disease

## 2019-10-12 ENCOUNTER — Other Ambulatory Visit (HOSPITAL_COMMUNITY): Payer: Self-pay | Admitting: Internal Medicine

## 2019-10-12 MED FILL — LEVOTHYROXINE 50 MCG TABLET: 50 | 90 days supply | Qty: 90 | Fill #0

## 2019-10-12 MED FILL — ALPRAZolam 0.25 MG TABS: 0.25 | 30 days supply | Qty: 30 | Fill #0

## 2019-10-13 DIAGNOSIS — N812 Incomplete uterovaginal prolapse: Secondary | ICD-10-CM | POA: Diagnosis not present

## 2019-10-13 DIAGNOSIS — N393 Stress incontinence (female) (male): Secondary | ICD-10-CM | POA: Diagnosis not present

## 2019-10-13 DIAGNOSIS — N398 Other specified disorders of urinary system: Secondary | ICD-10-CM | POA: Diagnosis not present

## 2019-10-13 DIAGNOSIS — K5900 Constipation, unspecified: Secondary | ICD-10-CM | POA: Diagnosis not present

## 2019-10-14 DIAGNOSIS — M79605 Pain in left leg: Secondary | ICD-10-CM | POA: Diagnosis not present

## 2019-10-14 DIAGNOSIS — M25561 Pain in right knee: Secondary | ICD-10-CM | POA: Diagnosis not present

## 2019-10-14 DIAGNOSIS — M542 Cervicalgia: Secondary | ICD-10-CM | POA: Diagnosis not present

## 2019-10-14 DIAGNOSIS — M545 Low back pain: Secondary | ICD-10-CM | POA: Diagnosis not present

## 2019-10-18 ENCOUNTER — Telehealth: Payer: Self-pay | Admitting: Rheumatology

## 2019-10-18 NOTE — Telephone Encounter (Signed)
Returned patient's call and discussed that there is no direct association between the mesh or the swelling to the autoimmune disease.  Although, any physical or psychological stress on the body can trigger autoimmune disease.  She voiced understanding.

## 2019-10-18 NOTE — Telephone Encounter (Signed)
Patient advised Dr. Estanislado Pandy does not have any information regarding urethral sling.  She may want to discuss that further with her urologist.  Patient states her concern is there will be mesh inserted and that could possibly cause a an inflammatory or autoimmune response. Patient wants to know if she is at higher risk because of her elevated ANA over the last year. Please advise.

## 2019-10-18 NOTE — Telephone Encounter (Signed)
Patient having unexpected surgery next week, and needs to find out about urethra sling / if possible for her to have an autoimmune reaction to that. Please call to advise.

## 2019-10-18 NOTE — Telephone Encounter (Signed)
I do not have any information regarding urethral sling.  She may want to discuss that further with her urologist.

## 2019-10-19 DIAGNOSIS — M25561 Pain in right knee: Secondary | ICD-10-CM | POA: Diagnosis not present

## 2019-10-19 DIAGNOSIS — M542 Cervicalgia: Secondary | ICD-10-CM | POA: Diagnosis not present

## 2019-10-19 DIAGNOSIS — M79605 Pain in left leg: Secondary | ICD-10-CM | POA: Diagnosis not present

## 2019-10-19 DIAGNOSIS — M545 Low back pain: Secondary | ICD-10-CM | POA: Diagnosis not present

## 2019-10-20 DIAGNOSIS — N812 Incomplete uterovaginal prolapse: Secondary | ICD-10-CM | POA: Diagnosis not present

## 2019-10-25 DIAGNOSIS — M25561 Pain in right knee: Secondary | ICD-10-CM | POA: Diagnosis not present

## 2019-10-25 DIAGNOSIS — M545 Low back pain: Secondary | ICD-10-CM | POA: Diagnosis not present

## 2019-10-25 DIAGNOSIS — M542 Cervicalgia: Secondary | ICD-10-CM | POA: Diagnosis not present

## 2019-10-25 DIAGNOSIS — M79605 Pain in left leg: Secondary | ICD-10-CM | POA: Diagnosis not present

## 2019-10-28 DIAGNOSIS — D259 Leiomyoma of uterus, unspecified: Secondary | ICD-10-CM | POA: Diagnosis not present

## 2019-10-28 DIAGNOSIS — K5900 Constipation, unspecified: Secondary | ICD-10-CM | POA: Diagnosis present

## 2019-10-28 DIAGNOSIS — N812 Incomplete uterovaginal prolapse: Secondary | ICD-10-CM | POA: Diagnosis not present

## 2019-10-28 DIAGNOSIS — N814 Uterovaginal prolapse, unspecified: Secondary | ICD-10-CM | POA: Diagnosis present

## 2019-10-28 DIAGNOSIS — Z87448 Personal history of other diseases of urinary system: Secondary | ICD-10-CM | POA: Diagnosis not present

## 2019-10-28 DIAGNOSIS — E871 Hypo-osmolality and hyponatremia: Secondary | ICD-10-CM | POA: Diagnosis present

## 2019-10-28 DIAGNOSIS — N393 Stress incontinence (female) (male): Secondary | ICD-10-CM | POA: Diagnosis not present

## 2019-10-28 DIAGNOSIS — K219 Gastro-esophageal reflux disease without esophagitis: Secondary | ICD-10-CM | POA: Diagnosis present

## 2019-10-28 DIAGNOSIS — N800 Endometriosis of uterus: Secondary | ICD-10-CM | POA: Diagnosis not present

## 2019-10-28 DIAGNOSIS — E039 Hypothyroidism, unspecified: Secondary | ICD-10-CM | POA: Diagnosis present

## 2019-10-28 DIAGNOSIS — I10 Essential (primary) hypertension: Secondary | ICD-10-CM | POA: Diagnosis present

## 2019-10-28 DIAGNOSIS — N879 Dysplasia of cervix uteri, unspecified: Secondary | ICD-10-CM | POA: Diagnosis not present

## 2019-10-28 DIAGNOSIS — E785 Hyperlipidemia, unspecified: Secondary | ICD-10-CM | POA: Diagnosis present

## 2019-11-05 MED FILL — NYSTATIN-TRIAMCINOLONE CRM: 100000-0.1 | 14 days supply | Qty: 30 | Fill #0

## 2019-11-05 MED FILL — FLUCONAZOLE 150 MG TABS: 150 | 3 days supply | Qty: 3 | Fill #0

## 2019-11-09 MED FILL — ALPRAZolam 0.25 MG TABS: 0.25 | 30 days supply | Qty: 30 | Fill #1

## 2019-11-11 MED FILL — NYSTATIN-TRIAMCINOLONE CRM: 100000-0.1 | 5 days supply | Qty: 30 | Fill #1

## 2019-12-09 ENCOUNTER — Other Ambulatory Visit: Payer: Self-pay | Admitting: Cardiovascular Disease

## 2019-12-09 MED FILL — EZETIMIBE 10 MG TABS: 10 | 90 days supply | Qty: 90 | Fill #0

## 2019-12-09 MED FILL — NYSTATIN-TRIAMCINOLONE CRM: 100000-0.1 | 5 days supply | Qty: 30 | Fill #2

## 2019-12-09 MED FILL — ALPRAZolam 0.25 MG TABS: 0.25 | 30 days supply | Qty: 30 | Fill #0

## 2019-12-15 DIAGNOSIS — H538 Other visual disturbances: Secondary | ICD-10-CM | POA: Diagnosis not present

## 2019-12-24 ENCOUNTER — Telehealth: Payer: Self-pay | Admitting: Cardiovascular Disease

## 2019-12-24 DIAGNOSIS — G453 Amaurosis fugax: Secondary | ICD-10-CM

## 2019-12-24 DIAGNOSIS — H539 Unspecified visual disturbance: Secondary | ICD-10-CM

## 2019-12-24 NOTE — Telephone Encounter (Signed)
Patient requested to schedule Carotid via patient schedule message. However, order has not been placed. Please assist.

## 2019-12-24 NOTE — Telephone Encounter (Signed)
Received request from Dr Macarthur Critchley Do to do carotid duplex on patient secondary to amaurosis fugax or migraine aurora symptoms. He asked they be sent to him once they are completed fax # [phone] Discussed with Dr Claiborne Billings and ok to order Order placed and message to scheduling to arrange

## 2019-12-31 DIAGNOSIS — Z23 Encounter for immunization: Secondary | ICD-10-CM | POA: Diagnosis not present

## 2020-01-04 DIAGNOSIS — Z79899 Other long term (current) drug therapy: Secondary | ICD-10-CM | POA: Diagnosis not present

## 2020-01-04 DIAGNOSIS — M8580 Other specified disorders of bone density and structure, unspecified site: Secondary | ICD-10-CM | POA: Diagnosis not present

## 2020-01-04 DIAGNOSIS — Z0000 Encounter for general adult medical examination without abnormal findings: Secondary | ICD-10-CM | POA: Diagnosis not present

## 2020-01-04 DIAGNOSIS — N390 Urinary tract infection, site not specified: Secondary | ICD-10-CM | POA: Diagnosis not present

## 2020-01-04 DIAGNOSIS — E039 Hypothyroidism, unspecified: Secondary | ICD-10-CM | POA: Diagnosis not present

## 2020-01-04 DIAGNOSIS — I129 Hypertensive chronic kidney disease with stage 1 through stage 4 chronic kidney disease, or unspecified chronic kidney disease: Secondary | ICD-10-CM | POA: Diagnosis not present

## 2020-01-04 DIAGNOSIS — E785 Hyperlipidemia, unspecified: Secondary | ICD-10-CM | POA: Diagnosis not present

## 2020-01-04 DIAGNOSIS — E559 Vitamin D deficiency, unspecified: Secondary | ICD-10-CM | POA: Diagnosis not present

## 2020-01-04 DIAGNOSIS — I714 Abdominal aortic aneurysm, without rupture: Secondary | ICD-10-CM | POA: Diagnosis not present

## 2020-01-04 DIAGNOSIS — I10 Essential (primary) hypertension: Secondary | ICD-10-CM | POA: Diagnosis not present

## 2020-01-04 DIAGNOSIS — E7800 Pure hypercholesterolemia, unspecified: Secondary | ICD-10-CM | POA: Diagnosis not present

## 2020-01-10 ENCOUNTER — Other Ambulatory Visit: Payer: Self-pay | Admitting: Cardiovascular Disease

## 2020-01-10 ENCOUNTER — Other Ambulatory Visit (HOSPITAL_COMMUNITY): Payer: Self-pay | Admitting: Internal Medicine

## 2020-01-10 MED FILL — CANDESARTAN CILEXETIL 16 MG: 16 | 90 days supply | Qty: 90 | Fill #0

## 2020-01-10 MED FILL — CEPHALEXIN 500 MG CAPSULE: 500 | 7 days supply | Qty: 14 | Fill #0

## 2020-01-10 MED FILL — PRAVASTATIN NA 40 MG TAB: 40 | 90 days supply | Qty: 45 | Fill #0

## 2020-01-10 MED FILL — ESCITALOPRAM 20 MG TABLET: 20 | 90 days supply | Qty: 90 | Fill #0

## 2020-01-12 ENCOUNTER — Ambulatory Visit (HOSPITAL_COMMUNITY)
Admission: RE | Admit: 2020-01-12 | Discharge: 2020-01-12 | Disposition: A | Discharge status: Home / Self Care | Payer: Medicare Other | Source: Ambulatory Visit | Attending: Cardiology | Admitting: Cardiology

## 2020-01-12 ENCOUNTER — Other Ambulatory Visit: Payer: Self-pay

## 2020-01-12 DIAGNOSIS — E785 Hyperlipidemia, unspecified: Secondary | ICD-10-CM | POA: Diagnosis not present

## 2020-01-12 DIAGNOSIS — H539 Unspecified visual disturbance: Secondary | ICD-10-CM | POA: Diagnosis not present

## 2020-01-12 DIAGNOSIS — H53123 Transient visual loss, bilateral: Secondary | ICD-10-CM

## 2020-01-12 DIAGNOSIS — I714 Abdominal aortic aneurysm, without rupture: Secondary | ICD-10-CM | POA: Diagnosis not present

## 2020-01-12 DIAGNOSIS — Z0000 Encounter for general adult medical examination without abnormal findings: Secondary | ICD-10-CM | POA: Diagnosis not present

## 2020-01-12 DIAGNOSIS — N390 Urinary tract infection, site not specified: Secondary | ICD-10-CM | POA: Diagnosis not present

## 2020-01-12 DIAGNOSIS — E039 Hypothyroidism, unspecified: Secondary | ICD-10-CM | POA: Diagnosis not present

## 2020-01-14 ENCOUNTER — Ambulatory Visit (INDEPENDENT_AMBULATORY_CARE_PROVIDER_SITE_OTHER): Payer: Medicare Other

## 2020-01-14 ENCOUNTER — Ambulatory Visit (INDEPENDENT_AMBULATORY_CARE_PROVIDER_SITE_OTHER): Payer: Medicare Other | Admitting: Orthopaedic Surgery

## 2020-01-14 ENCOUNTER — Encounter: Payer: Self-pay | Admitting: Orthopaedic Surgery

## 2020-01-14 VITALS — Ht 62.0 in | Wt 158.0 lb

## 2020-01-14 DIAGNOSIS — M1711 Unilateral primary osteoarthritis, right knee: Secondary | ICD-10-CM | POA: Diagnosis not present

## 2020-01-14 DIAGNOSIS — M25561 Pain in right knee: Secondary | ICD-10-CM

## 2020-01-14 DIAGNOSIS — G8929 Other chronic pain: Secondary | ICD-10-CM

## 2020-01-14 DIAGNOSIS — M545 Low back pain, unspecified: Secondary | ICD-10-CM | POA: Diagnosis not present

## 2020-01-14 MED ORDER — PREDNISONE 10 MG (21) PO TBPK
ORAL_TABLET | ORAL | 0 refills | Status: DC
Start: 2020-01-14 — End: 2020-04-17

## 2020-01-14 NOTE — Progress Notes (Addendum)
Office Visit Note   Patient: Jenny Bradford           Date of Birth: [birth year]           MRN: [id number] Visit Date: 01/14/2020              Requested by: [name], [address],  [place] [address] PCP: [name], FNP   Assessment & Plan: Visit Diagnoses:  1. Primary osteoarthritis of right knee   2. Chronic left-sided low back pain, unspecified whether sciatica present     Plan: Impression is right knee OA with symptomatic medial compartment mainly and lumbar radiculopathy.  We will call in a prednisone Dosepak and add this to her physical therapy regimen.  She is interested in trying a medial unloader brace.  Follow-Up Instructions: Return if symptoms worsen or fail to improve.   Orders:  Orders Placed This Encounter  Procedures  . XR KNEE 3 VIEW RIGHT  . XR Lumbar Spine 2-3 Views   Meds ordered this encounter  Medications  . predniSONE (STERAPRED UNI-PAK 21 TAB) 10 MG (21) TBPK tablet    Sig: Take as directed    Dispense:  21 tablet    Refill:  0      Procedures: No procedures performed   Clinical Data: No additional findings.   Subjective: Chief Complaint  Patient presents with  . Right Knee - Pain  . Lower Back - Pain    Jenny Bradford comes in today for chronic right knee pain.  She had a gel injection last year but did not feel any improvement.  She has only medial sided knee pain without any mechanical symptoms or effusion.  Currently wearing a compression knee sleeve.  Takes Tylenol and ibuprofen as needed.  Her pain is mainly with activity.  Takes tramadol at nighttime.  She is also complaining of left-sided buttock pain that travels down the backside of the leg to the knee.  Denies any numbness tingling or weakness.  She has pain with walking.   Review of Systems  Constitutional: Negative.   HENT: Negative.   Eyes: Negative.   Respiratory: Negative.   Cardiovascular: Negative.   Endocrine:  Negative.   Musculoskeletal: Negative.   Neurological: Negative.   Hematological: Negative.   Psychiatric/Behavioral: Negative.   All other systems reviewed and are negative.    Objective: Vital Signs: Ht 5\' 2"  (1.575 m)   Wt 158 lb (71.7 kg)   BMI 28.90 kg/m   Physical Exam Vitals and nursing note reviewed.  Constitutional:      Appearance: She is well-developed.  Pulmonary:     Effort: Pulmonary effort is normal.  Skin:    General: Skin is warm.     Capillary Refill: Capillary refill takes less than 2 seconds.  Neurological:     Mental Status: She is alert and oriented to person, place, and time.  Psychiatric:        Behavior: Behavior normal.        Thought Content: Thought content normal.        Judgment: Judgment normal.     Ortho Exam Right knee shows no joint effusion.  Medial joint line tenderness.  Cruciates are stable.  Collateral testing shows mild instability with valgus stress with a solid endpoint.  Lumbar exam shows no weakness of the lower extremities.  Range of motion is slightly limited.  No sciatic tension signs. Specialty Comments:  No specialty comments available.  Imaging: No results found.   PMFS History: Patient Active Problem List   Diagnosis Date Noted  . Primary osteoarthritis of left knee 01/26/2019  . Primary osteoarthritis of right knee 01/26/2019  . Palpitations 09/04/2017  . Family history of coronary artery disease in father 09/04/2017  . Paraesophageal hernia 05/13/2017  . Neck pain 01/03/2017  . Large hiatal hernia 12/28/2016  . Multiple pulmonary nodules determined by computed tomography of lung 12/27/2016  . Arthritis of carpometacarpal [facility]) joint of left thumb 05/28/2016  . OSA (obstructive sleep apnea) 10/05/2012  . HTN (hypertension) 10/05/2012  . Hyperlipidemia 10/05/2012  . Migraine headache 10/05/2012   Past Medical History:  Diagnosis Date  . AAA (abdominal aortic aneurysm) ([place]) 08/2014   scanning every 2  years  . Arthritis    oa  . Complication of anesthesia    slow to awaken in past  . Family history of anesthesia complication    slow to awaken  . GERD (gastroesophageal reflux disease)   . Grade II diastolic dysfunction 47/0962   Noted on ECHO  . H/O hiatal hernia   . Headache(784.0)    migraines (rare)  . Hemorrhoids   . Hyperlipidemia   . Hypertension 04/01/11   ECHO-EF>55% NUC STRESS TEST- 05/01/12  . Hypothyroidism   . Mild aortic valve regurgitation 04/2017   Noted on ECHO  . Mitral valve regurgitation 04/2017   Mild: Noted on ECHO  . Pulmonary nodules 2018   scanning every 6 months  . Sleep apnea 05/01/12 & 05/29/12   SLEEP STUDY-[facility], NO CPAP USED SINCE MAY 2015 Uses oral device    Family History  Problem Relation Age of Onset  . Breast cancer Mother   . Ovarian cancer Mother   . Rheum arthritis Mother   . Heart attack Father   . Heart disease Father   . Hypertension Brother   . Heart disease Brother   . Hyperlipidemia Maternal Grandmother   . Hypertension Maternal Grandmother   . Polymyalgia rheumatica Paternal Aunt   . Hypertension Son   . Migraines Daughter   . Raynaud syndrome Daughter     Past Surgical History:  Procedure Laterality Date  . ANKLE SURGERY Right 1980  . CARPOMETACARPEL SUSPENSION PLASTY Right 12/14/2014   Procedure: RIGHT THUMB CARPOMETACARPAL ([place]) ARTHROPLASTY;  Surgeon: Leandrew Koyanagi, MD;  Location: [facility];  Service: Orthopedics;  Laterality: Right;  . CARPOMETACARPEL SUSPENSION PLASTY Left 03/19/2017   Procedure: Left Thumb Ligament Reconstruction and Tendon Interposition;  Surgeon: Leandrew Koyanagi, MD;  Location: [facility];  Service: Orthopedics;  Laterality: Left;  . COLONOSCOPY WITH PROPOFOL N/A 12/07/2013   Procedure: COLONOSCOPY WITH PROPOFOL;  Surgeon: Garlan Fair, MD;  Location: [facility];  Service: Endoscopy;  Laterality: N/A;  . CYSTECTOMY  1975  . FOOT SURGERY Right  9.23.14  . FOOT SURGERY Left 10.01.13  . [place]  2005  . HERNIA REPAIR  2019  . Miscarriage  49  . TONSILLECTOMY  1964   Social History   Occupational History  . Not on file  Tobacco Use  . Smoking status: Never Smoker  . Smokeless tobacco: Never Used  Vaping Use  . Vaping Use: Never used  Substance and Sexual Activity  . Alcohol use: No    Alcohol/week: 0.0 standard drinks  . Drug use: No  . Sexual activity: Not on file

## 2020-01-26 DIAGNOSIS — N390 Urinary tract infection, site not specified: Secondary | ICD-10-CM | POA: Diagnosis not present

## 2020-02-11 ENCOUNTER — Other Ambulatory Visit (HOSPITAL_COMMUNITY): Payer: Self-pay | Admitting: Internal Medicine

## 2020-02-11 MED FILL — SUMATRIPTAN SUCCINATE 100 M: 100 | 23 days supply | Qty: 9 | Fill #0

## 2020-02-11 MED FILL — ALPRAZolam 0.25 MG TABS: 0.25 | 30 days supply | Qty: 30 | Fill #0

## 2020-02-11 MED FILL — PROCTOZONE-HC 2.5 % CREA: 2.5 | 15 days supply | Qty: 30 | Fill #0

## 2020-03-09 MED FILL — ALPRAZolam 0.25 MG TABS: 0.25 | 30 days supply | Qty: 30 | Fill #1

## 2020-03-09 MED FILL — EZETIMIBE 10 MG TABS: 10 | 90 days supply | Qty: 90 | Fill #1

## 2020-03-28 ENCOUNTER — Other Ambulatory Visit (HOSPITAL_COMMUNITY): Payer: Self-pay | Admitting: Obstetrics & Gynecology

## 2020-03-28 MED FILL — NITROFURANTOIN MONO-MCR 100: 100 | 5 days supply | Qty: 10 | Fill #0

## 2020-04-11 ENCOUNTER — Other Ambulatory Visit (HOSPITAL_COMMUNITY): Payer: Self-pay | Admitting: Internal Medicine

## 2020-04-11 MED FILL — CANDESARTAN CILEXETIL 16 MG: 16 | 90 days supply | Qty: 90 | Fill #1

## 2020-04-11 MED FILL — LEVOTHYROXINE 50 MCG TABLET: 50 | 90 days supply | Qty: 90 | Fill #2

## 2020-04-11 MED FILL — PRAVASTATIN NA 40 MG TAB: 40 | 90 days supply | Qty: 45 | Fill #1

## 2020-04-11 MED FILL — ALPRAZolam 0.25 MG TABS: 0.25 | 30 days supply | Qty: 30 | Fill #0

## 2020-04-11 MED FILL — BUTALB-ACETAMIN-CAFF 50-325: 50-325-40 | 10 days supply | Qty: 60 | Fill #0

## 2020-04-13 DIAGNOSIS — R399 Unspecified symptoms and signs involving the genitourinary system: Secondary | ICD-10-CM | POA: Diagnosis not present

## 2020-04-17 ENCOUNTER — Other Ambulatory Visit: Payer: Self-pay

## 2020-04-17 ENCOUNTER — Ambulatory Visit (INDEPENDENT_AMBULATORY_CARE_PROVIDER_SITE_OTHER): Payer: Medicare Other | Admitting: Cardiovascular Disease

## 2020-04-17 ENCOUNTER — Encounter: Payer: Self-pay | Admitting: Cardiovascular Disease

## 2020-04-17 VITALS — BP 110/75 | HR 59 | Ht 63.0 in | Wt 161.0 lb

## 2020-04-17 DIAGNOSIS — R768 Other specified abnormal immunological findings in serum: Secondary | ICD-10-CM

## 2020-04-17 DIAGNOSIS — E039 Hypothyroidism, unspecified: Secondary | ICD-10-CM | POA: Diagnosis not present

## 2020-04-17 DIAGNOSIS — I10 Essential (primary) hypertension: Secondary | ICD-10-CM | POA: Diagnosis not present

## 2020-04-17 DIAGNOSIS — G4733 Obstructive sleep apnea (adult) (pediatric): Secondary | ICD-10-CM

## 2020-04-17 DIAGNOSIS — I5189 Other ill-defined heart diseases: Secondary | ICD-10-CM | POA: Diagnosis not present

## 2020-04-17 DIAGNOSIS — E7800 Pure hypercholesterolemia, unspecified: Secondary | ICD-10-CM

## 2020-04-17 DIAGNOSIS — K449 Diaphragmatic hernia without obstruction or gangrene: Secondary | ICD-10-CM

## 2020-04-17 NOTE — Patient Instructions (Signed)

## 2020-04-17 NOTE — Progress Notes (Signed)
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  I had cared for her father Jenny Bradford for many years who ultimately died on 02/09/14 at [facility].  She established cardiology care with me in February 2019.  I last saw her in June 2021.  She presents for an 8 month follow-up evaluation.   [name] has a history of hypertension for many years.  Most recently, she has been on losartan 75 mg daily in addition to HCTZ 25 mg.  She also has a history of hypothyroidism and has been on levothyroxine at 50 g.  There is a history of hyperlipidemia and she has been on pravastatin 40 mg.  Her father had a history of significant abdominal aortic aneurysm.  She underwent abdominal aortic aneurysm screening and there was no significant aortic dilatation and instructed with a maximum aortic dimension at 2.3 cm.  She has a history of obstructive sleep apnea which was diagnosed in 2014 with an AHI of 14 per hour and RDI of 16.5 per hour.  She had significant oxygen desaturation to a nadir of 81% with rems sleep and had loud snoring.  Apparently should use CPAP for a short while but ultimately became intolerant was ultimately transitioned to an oral appliance for which she is followed by [name].   When I saw her in February 2019 she had begun to notice that despite taking medication for her blood pressure, her blood pressure elevated.  She was walking 4 days per week for 30 minutes at a time.  She denied any exertionally precipitation of chest pain.  She was unaware of any significant valvular abnormality, although her mother has aortic stenosis.  She also has a very large hiatal hernia and has seen Dr. [name] and was told that in the future she may require  surgery.  She underwent surgery for paraesophageal hernia repair with a Nissen fundoplication in March 7342.  She also has had progressive difficulty with osteoarthritis involving her neck, back, hands, feet, and feet.  He has not been able to exercise as regularly.  She is no longer working since she is unable to stand up the entire day.  She now sees [name] for primary care.  She has been caring for her husband who is permanently disabled since age 70.    When I  saw her on December 08, 2017 as result of the generic impurities I had recommended she change from losartan 100 mg daily to olmesartan 40 mg.  Her lipid studies were not optimal on pravastatin and I suggested discontinuance of this and in its place started atorvastatin 40 mg.  An echo Doppler study in February 2019 showed an EF of 60 to 65% with grade 2 diastolic dysfunction, mild AR and mild MR.  An event monitor had shown sinus rhythm with low average heart rate at 50 and high average heart rate at 70 bpm.  She had several atrial couplets and one atrial triplet.  There were no episodes of atrial fibrillation.  There was no ventricular ectopy or pauses.  I saw her in March 2020 after she had called the office and wished to be seen sooner than her scheduled  appointment due to progressive fatigue and particularly shortness of breath particularly while climbing steps.  She had been self adjusting some of her medications and was no longer on olmesartan but has been on valsartan 160 mg.  However she felt better when she cuts this in half and had been taking 80 mg twice a day.  Denied any chest pain and was walking 1-1 and half miles per day.  She was only using CPAP intermittently and had adamantly using a customized oral appliance.   She was evaluated in a telemedicine visit in June 2020.  At that time she was  on candesartan for ARB therapy.  Her dose was further titrated to 60 mg in the morning and 8 mg in the evening.  She was on  atorvastatin 40 mg on Monday Wednesday and Friday for hyperlipidemia.  She was using an oral appliance for obstructive sleep apnea from [name].  An echo Doppler study from August 13, 2018 showed normal systolic function with EF at 55 to 60%.  There was mild aortic sclerosis with mild AR.  The aortic root and ascending aorta and aortic arch were normal in dimensions.  I last saw her on December 07, 2018 which time she was complaining of some muscle aching.  As result she stopped taking atorvastatin several weeks prior to that evaluation.  Previously she felt she tolerated pravastatin better.  She had noticed fatigue and at times low blood pressures.  She will be undergoing a hysterectomy in late January.  I reviewed laboratory from November 23, 2018.  BUN 13 creatinine 0.94.  LFTs normal.  Stable CBC.  Compared to March 2020, cholesterol increased from 140 to162.  LDL increased from 57 while on atorvastatin daily to 84 on her reduced dose.  During that evaluation, recommended institution of Zetia 10 mg.  2 weeks thereafter I recommended resumption of pravastatin which she had tolerated better than atorvastatin.  I also checked a CPK and erythrocyte sedimentation rate which were normal arguing against myopathy or rhabdomyolysis.  She was using her oral appliance for obstructive sleep apnea.  I  saw her in December 2020 and over the several months prior to that evaluation she denied lany episodes of chest tightness or pressure.  She was contemplating whether or not to go through her elective neurectomy and pelvic floor reconstruction January with the spike in COVID-19 this is to be done by a urogynecologist at [facility].  She admits to fatigue.  She has not had recent laboratory on Zetia and and pravastatin which she has been taking 20 mg.  She continued to be on candesartan 16 mg, spironolactone 12.5 mg for hypertension and  levothyroxine 50 mcg for hypothyroidism.  During that evaluation she  continued to have some fatigue but admitted that she was not consistently using her oral appliance.  I last saw her in June 2021 and since her prior evaluation she was found to have positive ANA and was seen by Dr. Patrecia Pour.  She was started on aspirin therapy and subsequent laboratory showed improvement of inflammatory markers.  Subsequently she felt better.  She was never started on immunosuppressive therapy.  She has been wearing a knee brace and is followed by Dr. Erlinda Hong of orthopedics.  Most recently she did not tolerate a trial of atorvastatin for more aggressive lipid-lowering and resume taking pravastatin 20 mg in addition to Zetia for hyperlipidemia.  Her grandchild was born 6 weeks ago and she is now scheduled for her elective hysterectomy  to be done in August 2021.  Since her last evaluation, she has well.  She underwent her urogynecologic surgery October 28, 2019 and tolerated this well.  She has been able to tolerate Zetia and Pravachol statin and lipid studies in October 2021 were markedly improved with a total cholesterol of 130, LDL of 51, triglycerides at 98 and HDL 59.  She did have some issues with reading where she would sense a gap and miss words.  She was evaluated by her ophthalmologist who felt her vision is stable.  She will be undergoing reassessment of ANA at follow-up with Dr. Patrecia Pour.  Past Medical History:  Diagnosis Date  . AAA (abdominal aortic aneurysm) ([place]) 08/2014   scanning every 2 years  . Arthritis    oa  . Complication of anesthesia    slow to awaken in past  . Family history of anesthesia complication    slow to awaken  . GERD (gastroesophageal reflux disease)   . Grade II diastolic dysfunction 92/3300   Noted on ECHO  . H/O hiatal hernia   . Headache(784.0)    migraines (rare)  . Hemorrhoids   . Hyperlipidemia   . Hypertension 04/01/11   ECHO-EF>55% NUC STRESS TEST- 05/01/12  . Hypothyroidism   . Mild aortic valve regurgitation 04/2017   Noted on  ECHO  . Mitral valve regurgitation 04/2017   Mild: Noted on ECHO  . Pulmonary nodules 2018   scanning every 6 months  . Sleep apnea 05/01/12 & 05/29/12   SLEEP STUDY-[facility], NO CPAP USED SINCE MAY 2015 Uses oral device    Past Surgical History:  Procedure Laterality Date  . ANKLE SURGERY Right 1980  . CARPOMETACARPEL SUSPENSION PLASTY Right 12/14/2014   Procedure: RIGHT THUMB CARPOMETACARPAL ([place]) ARTHROPLASTY;  Surgeon: Leandrew Koyanagi, MD;  Location: [facility];  Service: Orthopedics;  Laterality: Right;  . CARPOMETACARPEL SUSPENSION PLASTY Left 03/19/2017   Procedure: Left Thumb Ligament Reconstruction and Tendon Interposition;  Surgeon: Leandrew Koyanagi, MD;  Location: [facility];  Service: Orthopedics;  Laterality: Left;  . COLONOSCOPY WITH PROPOFOL N/A 12/07/2013   Procedure: COLONOSCOPY WITH PROPOFOL;  Surgeon: Garlan Fair, MD;  Location: [facility];  Service: Endoscopy;  Laterality: N/A;  . CYSTECTOMY  1975  . FOOT SURGERY Right 9.23.14  . FOOT SURGERY Left 10.01.13  . [place]  2005  . HERNIA REPAIR  2019  . Miscarriage  75  . TONSILLECTOMY  1964    Current Medications: Outpatient Medications Prior to Visit  Medication Sig Dispense Refill  . acetaminophen (TYLENOL) 500 MG tablet Take 500 mg by mouth 2 (two) times daily.    Marland Kitchen ALPRAZolam (XANAX) 0.25 MG tablet Take 0.25 mg by mouth at bedtime as needed for sleep.     . butalbital-acetaminophen-caffeine (FIORICET, ESGIC) 50-325-40 MG per tablet Take 1 tablet by mouth 2 (two) times daily as needed for headache.    . candesartan (ATACAND) 16 MG tablet TAKE 1 TABLET BY MOUTH ONCE DAILY 90 tablet 2  . Cholecalciferol (VITAMIN D) 2000 units tablet Take 2,000 Units by mouth daily.    . diclofenac Sodium (VOLTAREN) 1 % GEL Apply 2 g topically 4 (four) times daily. 350 g 12  . Docusate Sodium 100 MG capsule Take 200 mg by mouth daily as needed for mild constipation. Takes 300  mg  as needed    . escitalopram (LEXAPRO) 20 MG tablet Take 10-20 mg by mouth every morning. Hot flashes    .  estradiol (ESTRACE) 0.1 MG/GM vaginal cream Place 2 g vaginally 2 (two) times a week. USE TWICE WEEKLY    . ezetimibe (ZETIA) 10 MG tablet TAKE 1 TABLET BY MOUTH ONCE DAILY 90 tablet 3  . hydrocortisone 2.5 % cream Proctosol HC 2.5 % rectal cream with applicator    . levothyroxine (SYNTHROID, LEVOTHROID) 50 MCG tablet Take 50 mcg by mouth daily before breakfast.    . linaclotide (LINZESS) 72 MCG capsule 2 (two) times a week.     . pravastatin (PRAVACHOL) 40 MG tablet TAKE 1/2 TABLET BY MOUTH EVERY EVENING 45 tablet 8  . simethicone (MYLICON) 80 MG chewable tablet Chew 1 tablet (80 mg total) by mouth every 6 (six) hours as needed for flatulence (bloating). 30 tablet 0  . spironolactone (ALDACTONE) 25 MG tablet TAKE 1/2 TABLET BY MOUTH DAILY, MAY USE AS NEEDED FOR SWELLING AS WELL 45 tablet 2  . SUMAtriptan (IMITREX) 50 MG tablet Take 50 mg by mouth every 2 (two) hours as needed for migraine.     . Wheat Dextrin (BENEFIBER PO) daily.    . Melatonin 3 MG CAPS 1 tablet at bedtime as needed    . diclofenac Sodium (VOLTAREN) 1 % GEL APPLY 2 GRAMS TOPICALLY 4 (FOUR) TIMES DAILY. 100 g 0  . Glucosamine HCl (GLUCOSAMINE PO) Take by mouth daily.     . predniSONE (STERAPRED UNI-PAK 21 TAB) 10 MG (21) TBPK tablet Take as directed 21 tablet 0   No facility-administered medications prior to visit.     Allergies:   Midol [aspirin-cinnamedrine-caffeine], Olmesartan, Talwin [pentazocine], Bisoprolol, and Naproxen   Social History   Socioeconomic History  . Marital status: Married    Spouse name: Not on file  . Number of children: Not on file  . Years of education: Not on file  . Highest education level: Not on file  Occupational History  . Not on file  Tobacco Use  . Smoking status: Never Smoker  . Smokeless tobacco: Never Used  Vaping Use  . Vaping Use: Never used  Substance and Sexual  Activity  . Alcohol use: No    Alcohol/week: 0.0 standard drinks  . Drug use: No  . Sexual activity: Not on file  Other Topics Concern  . Not on file  Social History Narrative  . 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  No tobacco history.  Family History:  The patient's family history includes Breast cancer in her mother; Heart attack in her father; Heart disease in her brother and father; Hyperlipidemia in her maternal grandmother; Hypertension in her brother, maternal grandmother, and son; Migraines in her daughter; Ovarian cancer in her mother; Polymyalgia rheumatica in her paternal aunt; Raynaud syndrome in her daughter; Rheum arthritis in her mother.   ROS General: Negative; No fevers, chills, or night sweats; increasing fatigue HEENT: Transient vision change negative; No changes  or hearing, sinus congestion, difficulty swallowing Pulmonary: Negative; No cough, wheezing, shortness of breath, hemoptysis Cardiovascular: See history of present illness, no chest pain, PND, orthopnea. GI: Positive for large hiatal hernia; is post Nissan fundoplication surgery GU: s/p elective hysterectomy and pelvic floor reconstruction Musculoskeletal: Positive for right knee discomfort wearing  a right knee brace Hematologic/Oncology: Negative; no easy bruising, bleeding Rheumatology: Positive ANA Endocrine: Negative; no heat/cold intolerance; no diabetes Neuro: Negative; no changes in balance, headaches Skin: Negative; No rashes or skin lesions Psychiatric: Negative; No behavioral problems, depression Sleep: Positive for OSA, now with a customized oral appliance with mandibular advancement; No snoring, daytime sleepiness,  hypersomnolence, bruxism, restless legs, hypnogognic hallucinations, no cataplexy Other comprehensive 14 point system review is negative.   PHYSICAL EXAM:   VS:  BP 110/75 (BP Location: Left Arm, Patient Position: Sitting)   Pulse (!) 59   Ht '5\' 3"'  (1.6 m)   Wt 161 lb (73 kg)   BMI 28.52 kg/m     Repeat blood pressure by me 136/84  Wt Readings from Last 3 Encounters:  04/17/20 161 lb (73 kg)  01/14/20 158 lb (71.7 kg)  08/30/19 160 lb 6.4 oz (72.8 kg)    General: Alert, oriented, no distress.  Skin: normal turgor, no rashes, warm and dry HEENT: Normocephalic, atraumatic. Pupils equal round and reactive to light; sclera anicteric; extraocular muscles intact;  Nose without nasal septal hypertrophy Mouth/Parynx benign; Mallinpatti scale 3 Neck: No JVD, no carotid bruits; normal carotid upstroke Lungs: clear to ausculatation and percussion; no wheezing or rales Chest wall: without tenderness to palpitation Heart: PMI not displaced, RRR, s1 s2 normal, 1/6 systolic murmur, no diastolic murmur, no rubs, gallops, thrills, or heaves Abdomen: soft, nontender; no hepatosplenomehaly, BS+; abdominal aorta nontender and not dilated by palpation. Back: no CVA tenderness Pulses 2+ Musculoskeletal: full range of motion, normal strength, no joint deformities Extremities: no clubbing cyanosis or edema, Homan's sign negative  Neurologic: grossly nonfocal; Cranial nerves grossly wnl Psychologic: Normal mood and affect   Studies/Labs Reviewed:   ECG (independently read by me): Sinus Bradycardia at 59; no ectopy, normal intervals  August 30, 2019 ECG (independently read by me): Sinus bradycardia 55 bpm.  No ectopy.  Normal intervals.  March 02, 2019 ECG (independently read by me): Normal sinus rhythm at 65 bpm.  No ectopy.  Normal intervals.  No ST segment changes.    September 2020 ECG (independently read by me): Normal sinus rhythm at 61 bpm.  Normal intervals.  No ectopy.  March 2020 ECG  (independently read by me): Normal sinus rhythm at 69 bpm.  Poor anterior R wave progression V1 through V3.  December 08, 2017 ECG (independently read by me): NSR at 66; normal intervals  04/21/2017 ECG (independently read by me): Normal sinus rhythm at 66 bpm.  No ST segment changes.  Normal intervals.  No ectopy.  Very mild RV conduction delay.  Recent Labs: BMP Latest Ref Rng & Units 06/18/2019 11/23/2018 06/04/2018  Glucose 65 - 99 mg/dL 71 80 88  BUN 7 - 25 mg/dL '21 13 16  ' Creatinine 0.50 - 0.99 mg/dL 0.89 0.94 0.89  BUN/Creat Ratio 6 - 22 (calc) NOT APPLICABLE 14 18  Sodium 135 - 146 mmol/L 136 137 141  Potassium 3.5 - 5.3 mmol/L 4.5 4.6 4.7  Chloride 98 - 110 mmol/L 103 103 103  CO2 20 - 32 mmol/L '25 20 23  ' Calcium 8.6 - 10.4 mg/dL 9.3 9.4 9.3     Hepatic Function Latest Ref Rng & Units 06/18/2019 11/23/2018 06/04/2018  Total Protein 6.1 - 8.1 g/dL 6.5 6.6 6.6  Albumin 3.8 - 4.8 g/dL - 4.0 4.1  AST 10 - 35 U/L '17 21 26  ' ALT 6 - 29 U/L 21 23 35(H)  Alk Phosphatase 39 - 117 IU/L -  71 94  Total Bilirubin 0.2 - 1.2 mg/dL 0.7 0.5 0.5    CBC Latest Ref Rng & Units 06/18/2019 11/23/2018 05/14/2017  WBC 3.8 - 10.8 Thousand/uL 5.8 5.0 16.4(H)  Hemoglobin 11.7 - 15.5 g/dL 13.6 13.7 11.5(L)  Hematocrit 35.0 - 45.0 % 41.0 40.1 34.7(L)  Platelets 140 - 400 Thousand/uL 248 273 215   Lab Results  Component Value Date   MCV 97.9 06/18/2019   MCV 97 11/23/2018   MCV 96.7 05/14/2017   Lab Results  Component Value Date   TSH 1.830 11/23/2018   No results found for: HGBA1C   BNP    Component Value Date/Time   BNP 30.6 11/23/2018 0918    ProBNP No results found for: PROBNP   Lipid Panel     Component Value Date/Time   CHOL 147 09/09/2019 0951   TRIG 135 09/09/2019 0951   HDL 57 09/09/2019 0951   CHOLHDL 2.6 09/09/2019 0951   LDLCALC 67 09/09/2019 0951     RADIOLOGY: No results found.   Additional studies/ records that were reviewed today include:  I reviewed the  patient's prior evaluation at the [facility] in 2014.  following a sleep study.  I reviewed her imaging studies, and prior evaluation by Dr. Melvyn Novas.  I reviewed her rheumatology evaluation from April 13, 2019   ASSESSMENT:    1. Essential hypertension   2. Pure hypercholesterolemia   3. Grade II diastolic dysfunction   4. OSA (obstructive sleep apnea)   5. Hypothyroidism, unspecified type   6. Large hiatal hernia s/p Nissan plication   7. ANA positive     PLAN:  Ms. 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  Remotely she had been on losartan for hypertension and pravastatin for hyperlipidemia.  Recently, her blood pressure has been well controlled on her regimen consisting of candesartan 16 mg and spironolactone 12.5 mg daily.  Remotely she had issues with myalgias on atorvastatin.  She has been able to tolerate Zetia 10 mg and pravastatin 20 mg.  Most recent lipid studies are excellent with an LDL cholesterol at 51 in October 2021.  Previous echo has demonstrated normal systolic function with EF of 60 to 65% with grade 2 diastolic dysfunction, mild AR and mild MR.  She has a history of hypothyroidism and remains on levothyroxine at 50 mcg.  He underwent her urogynecologic surgery without cardiovascular compromise.  He had recently been evaluated by her ophthalmologist for some visual disturbance who felt her vision was stable.  I suggested that if she continues to experience symptomatology neurologic evaluation may be helpful.  She is walking on a daily basis.  She has stable cataracts.  She is followed by Dr. Delma Officer and will undergo repeat assessment of ANA at her follow-up visit.  Following her urologic surgery she has had issues with recurrent UTIs and has required antibiotic therapy.  Cardiac wise she has been stable.  I will see her in  1 year for follow-up evaluation or sooner as needed.   Medication Adjustments/Labs and Tests Ordered: Current medicines are reviewed at length with the patient today.  Concerns regarding medicines are outlined above.  Medication changes, Labs and Tests ordered today are listed in the Patient Instructions below. Patient Instructions  Medication Instructions:  Your physician recommends that you continue on your current medications as directed. Please refer to the  Current Medication list given to you today.  *If you need a refill on your cardiac medications before your next appointment, please call your pharmacy*  Follow-Up: At [facility], you and your health needs are our priority.  As part of our continuing mission to provide you with exceptional heart care, we have created designated Provider Care Teams.  These Care Teams include your primary Cardiologist (physician) and Advanced Practice Providers (APPs -  Physician Assistants and Nurse Practitioners) who all work together to provide you with the care you need, when you need it.  We recommend signing up for the patient portal called "MyChart".  Sign up information is provided on this After Visit Summary.  MyChart is used to connect with patients for Virtual Visits (Telemedicine).  Patients are able to view lab/test results, encounter notes, upcoming appointments, etc.  Non-urgent messages can be sent to your provider as well.   To learn more about what you can do with MyChart, go to [email or web address].    Your next appointment:   12 month(s)  The format for your next appointment:   In Person  Provider:   [name], MD      Signed, [name], MD  04/18/2020 10:16 AM    [facility] [address] [place], [place], [place]  [address] Phone: [phone]

## 2020-04-18 ENCOUNTER — Encounter: Payer: Self-pay | Admitting: Cardiovascular Disease

## 2020-04-19 DIAGNOSIS — N390 Urinary tract infection, site not specified: Secondary | ICD-10-CM | POA: Diagnosis not present

## 2020-05-05 MED FILL — ALPRAZolam 0.25 MG TABS: 0.25 | 30 days supply | Qty: 30 | Fill #1

## 2020-05-05 MED FILL — ESCITALOPRAM 20 MG TABLET: 20 | 90 days supply | Qty: 90 | Fill #1

## 2020-06-06 MED FILL — ALPRAZolam 0.25 MG TABS: 0.25 | 30 days supply | Qty: 30 | Fill #2

## 2020-06-06 MED FILL — EZETIMIBE 10 MG TABLET: 10 | 90 days supply | Qty: 90 | Fill #2

## 2020-06-08 DIAGNOSIS — R399 Unspecified symptoms and signs involving the genitourinary system: Secondary | ICD-10-CM | POA: Diagnosis not present

## 2020-06-13 ENCOUNTER — Other Ambulatory Visit: Payer: Self-pay | Admitting: Obstetrics & Gynecology

## 2020-06-13 DIAGNOSIS — Z1231 Encounter for screening mammogram for malignant neoplasm of breast: Secondary | ICD-10-CM

## 2020-06-28 NOTE — Progress Notes (Deleted)
Office Visit Note  Patient: Jenny Bradford             Date of Birth: 08-23-1950           MRN: [id number]             PCP: [name], FNP Referring: [name], FNP Visit Date: 07/12/2020 Occupation: @GUAROCC @  Subjective:  No chief complaint on file.   History of Present Illness: Jenny Bradford is a 70 y.o. female ***   Activities of Daily Living:  Patient reports morning stiffness for *** {minute/hour:19697}.   Patient {ACTIONS;DENIES/REPORTS:21021675::"Denies"} nocturnal pain.  Difficulty dressing/grooming: {ACTIONS;DENIES/REPORTS:21021675::"Denies"} Difficulty climbing stairs: {ACTIONS;DENIES/REPORTS:21021675::"Denies"} Difficulty getting out of chair: {ACTIONS;DENIES/REPORTS:21021675::"Denies"} Difficulty using hands for taps, buttons, cutlery, and/or writing: {ACTIONS;DENIES/REPORTS:21021675::"Denies"}  No Rheumatology ROS completed.   PMFS History:  Patient Active Problem List   Diagnosis Date Noted  . Primary osteoarthritis of left knee 01/26/2019  . Primary osteoarthritis of right knee 01/26/2019  . Palpitations 09/04/2017  . Family history of coronary artery disease in father 09/04/2017  . Paraesophageal hernia 05/13/2017  . Neck pain 01/03/2017  . Large hiatal hernia 12/28/2016  . Multiple pulmonary nodules determined by computed tomography of lung 12/27/2016  . Arthritis of carpometacarpal [facility]) joint of left thumb 05/28/2016  . OSA (obstructive sleep apnea) 10/05/2012  . HTN (hypertension) 10/05/2012  . Hyperlipidemia 10/05/2012  . Migraine headache 10/05/2012    Past Medical History:  Diagnosis Date  . AAA (abdominal aortic aneurysm) ([place]) 08/2014   scanning every 2 years  . Arthritis    oa  . Complication of anesthesia    slow to awaken in past  . Family history of anesthesia complication    slow to awaken  . GERD (gastroesophageal reflux disease)   . Grade II diastolic dysfunction 40/8144   Noted on ECHO  . H/O  hiatal hernia   . Headache(784.0)    migraines (rare)  . Hemorrhoids   . Hyperlipidemia   . Hypertension 04/01/11   ECHO-EF>55% NUC STRESS TEST- 05/01/12  . Hypothyroidism   . Mild aortic valve regurgitation 04/2017   Noted on ECHO  . Mitral valve regurgitation 04/2017   Mild: Noted on ECHO  . Pulmonary nodules 2018   scanning every 6 months  . Sleep apnea 05/01/12 & 05/29/12   SLEEP STUDY-[place] [facility], NO CPAP USED SINCE MAY 2015 Uses oral device    Family History  Problem Relation Age of Onset  . Breast cancer Mother   . Ovarian cancer Mother   . Rheum arthritis Mother   . Heart attack Father   . Heart disease Father   . Hypertension Brother   . Heart disease Brother   . Hyperlipidemia Maternal Grandmother   . Hypertension Maternal Grandmother   . Polymyalgia rheumatica Paternal Aunt   . Hypertension Son   . Migraines Daughter   . Raynaud syndrome Daughter    Past Surgical History:  Procedure Laterality Date  . ANKLE SURGERY Right 1980  . CARPOMETACARPEL SUSPENSION PLASTY Right 12/14/2014   Procedure: RIGHT THUMB CARPOMETACARPAL ([place]) ARTHROPLASTY;  Surgeon: Leandrew Koyanagi, MD;  Location: [place];  Service: Orthopedics;  Laterality: Right;  . CARPOMETACARPEL SUSPENSION PLASTY Left 03/19/2017   Procedure: Left Thumb Ligament Reconstruction and Tendon Interposition;  Surgeon: Leandrew Koyanagi, MD;  Location: [place];  Service: Orthopedics;  Laterality: Left;  . COLONOSCOPY WITH PROPOFOL N/A 12/07/2013   Procedure: COLONOSCOPY WITH PROPOFOL;  Surgeon: Garlan Fair,  MD;  [facility];  Service: Endoscopy;  Laterality: N/A;  . CYSTECTOMY  1975  . FOOT SURGERY Right 9.23.14  . FOOT SURGERY Left 10.01.13  . [place]  2005  . HERNIA REPAIR  2019  . Miscarriage  98  . TONSILLECTOMY  1964   Social History   Social History Narrative  . Not on file   Immunization History  Administered Date(s) Administered   . Influenza Whole 11/09/2016  . Influenza,inj,quad, With Preservative 12/15/2013  . Influenza-Unspecified 11/10/2014, 11/09/2017  . Zoster Recombinat (Shingrix) 05/12/2018     Objective: Vital Signs: There were no vitals taken for this visit.   Physical Exam   Musculoskeletal Exam: ***  CDAI Exam: CDAI Score: -- Patient Global: --; Provider Global: -- Swollen: --; Tender: -- Joint Exam 07/12/2020   No joint exam has been documented for this visit   There is currently no information documented on the homunculus. Go to the Rheumatology activity and complete the homunculus joint exam.  Investigation: No additional findings.  Imaging: No results found.  Recent Labs: Lab Results  Component Value Date   WBC 5.8 06/18/2019   HGB 13.6 06/18/2019   PLT 248 06/18/2019   NA 136 06/18/2019   K 4.5 06/18/2019   CL 103 06/18/2019   CO2 25 06/18/2019   GLUCOSE 71 06/18/2019   BUN 21 06/18/2019   CREATININE 0.89 06/18/2019   BILITOT 0.7 06/18/2019   ALKPHOS 71 11/23/2018   AST 17 06/18/2019   ALT 21 06/18/2019   PROT 6.5 06/18/2019   ALBUMIN 4.0 11/23/2018   CALCIUM 9.3 06/18/2019   GFRAA 74 11/23/2018    Speciality Comments: No specialty comments available.  Procedures:  No procedures performed Allergies: Midol [aspirin-cinnamedrine-caffeine], Olmesartan, Talwin [pentazocine], Bisoprolol, and Naproxen   Assessment / Plan:     Visit Diagnoses: No diagnosis found.  Orders: No orders of the defined types were placed in this encounter.  No orders of the defined types were placed in this encounter.   Face-to-face time spent with patient was *** minutes. Greater than 50% of time was spent in counseling and coordination of care.  Follow-Up Instructions: No follow-ups on file.   Earnestine Mealing, CMA  Note - This record has been created using Editor, commissioning.  Chart creation errors have been sought, but may not always  have been located. Such creation errors do  not reflect on  the standard of medical care.

## 2020-07-05 ENCOUNTER — Other Ambulatory Visit (HOSPITAL_COMMUNITY): Payer: Self-pay

## 2020-07-05 MED ORDER — LEVOTHYROXINE SODIUM 50 MCG PO TABS
50.0000 ug | ORAL_TABLET | Freq: Every day | ORAL | 0 refills | Status: DC
  Filled 2020-07-05: qty 90, 90d supply, fill #0

## 2020-07-05 MED ORDER — SPIRONOLACTONE 25 MG PO TABS
ORAL_TABLET | ORAL | 2 refills | Status: DC
  Filled 2020-07-05: qty 45, 90d supply, fill #0
  Filled 2021-01-21: qty 45, 60d supply, fill #1

## 2020-07-05 MED FILL — Pravastatin Sodium Tab 40 MG: ORAL | 90 days supply | Qty: 45 | Fill #0 | Status: AC

## 2020-07-07 ENCOUNTER — Other Ambulatory Visit (HOSPITAL_COMMUNITY): Payer: Self-pay

## 2020-07-07 MED ORDER — ALPRAZOLAM 0.25 MG PO TABS
ORAL_TABLET | ORAL | 2 refills | Status: DC
  Filled 2020-07-07: qty 30, 30d supply, fill #0
  Filled 2020-08-06: qty 30, 30d supply, fill #1
  Filled 2020-09-04: qty 30, 30d supply, fill #2

## 2020-07-08 ENCOUNTER — Other Ambulatory Visit (HOSPITAL_COMMUNITY): Payer: Self-pay

## 2020-07-12 ENCOUNTER — Ambulatory Visit: Payer: Medicare Other | Admitting: Rheumatology

## 2020-07-12 DIAGNOSIS — E785 Hyperlipidemia, unspecified: Secondary | ICD-10-CM | POA: Diagnosis not present

## 2020-07-12 DIAGNOSIS — R0602 Shortness of breath: Secondary | ICD-10-CM

## 2020-07-12 DIAGNOSIS — Z8261 Family history of arthritis: Secondary | ICD-10-CM

## 2020-07-12 DIAGNOSIS — M5030 Other cervical disc degeneration, unspecified cervical region: Secondary | ICD-10-CM

## 2020-07-12 DIAGNOSIS — R768 Other specified abnormal immunological findings in serum: Secondary | ICD-10-CM

## 2020-07-12 DIAGNOSIS — Z8249 Family history of ischemic heart disease and other diseases of the circulatory system: Secondary | ICD-10-CM

## 2020-07-12 DIAGNOSIS — Z8639 Personal history of other endocrine, nutritional and metabolic disease: Secondary | ICD-10-CM

## 2020-07-12 DIAGNOSIS — Z8669 Personal history of other diseases of the nervous system and sense organs: Secondary | ICD-10-CM

## 2020-07-12 DIAGNOSIS — R5383 Other fatigue: Secondary | ICD-10-CM

## 2020-07-12 DIAGNOSIS — G4733 Obstructive sleep apnea (adult) (pediatric): Secondary | ICD-10-CM

## 2020-07-12 DIAGNOSIS — Z8269 Family history of other diseases of the musculoskeletal system and connective tissue: Secondary | ICD-10-CM

## 2020-07-12 DIAGNOSIS — M170 Bilateral primary osteoarthritis of knee: Secondary | ICD-10-CM

## 2020-07-12 DIAGNOSIS — K449 Diaphragmatic hernia without obstruction or gangrene: Secondary | ICD-10-CM

## 2020-07-12 DIAGNOSIS — I10 Essential (primary) hypertension: Secondary | ICD-10-CM

## 2020-07-12 DIAGNOSIS — M1812 Unilateral primary osteoarthritis of first carpometacarpal joint, left hand: Secondary | ICD-10-CM

## 2020-07-19 DIAGNOSIS — D8989 Other specified disorders involving the immune mechanism, not elsewhere classified: Secondary | ICD-10-CM | POA: Diagnosis not present

## 2020-07-19 DIAGNOSIS — R768 Other specified abnormal immunological findings in serum: Secondary | ICD-10-CM | POA: Diagnosis not present

## 2020-07-25 ENCOUNTER — Other Ambulatory Visit: Payer: Self-pay

## 2020-07-25 DIAGNOSIS — I714 Abdominal aortic aneurysm, without rupture, unspecified: Secondary | ICD-10-CM

## 2020-07-28 ENCOUNTER — Other Ambulatory Visit (HOSPITAL_COMMUNITY): Payer: Self-pay

## 2020-07-28 MED FILL — Candesartan Cilexetil Tab 16 MG: ORAL | 90 days supply | Qty: 90 | Fill #0 | Status: AC

## 2020-07-31 ENCOUNTER — Telehealth: Payer: Self-pay | Admitting: *Deleted

## 2020-07-31 ENCOUNTER — Other Ambulatory Visit (HOSPITAL_COMMUNITY): Payer: Self-pay

## 2020-07-31 NOTE — Telephone Encounter (Signed)
Order and records faxed to Dr Oneal Grout for oral appliance to treat OSA.

## 2020-08-02 ENCOUNTER — Ambulatory Visit
Admission: RE | Admit: 2020-08-02 | Discharge: 2020-08-02 | Disposition: A | Discharge status: Home / Self Care | Payer: Medicare Other | Source: Ambulatory Visit | Attending: Obstetrics & Gynecology | Admitting: Obstetrics & Gynecology

## 2020-08-02 ENCOUNTER — Other Ambulatory Visit: Payer: Self-pay

## 2020-08-02 DIAGNOSIS — Z1231 Encounter for screening mammogram for malignant neoplasm of breast: Secondary | ICD-10-CM | POA: Diagnosis not present

## 2020-08-03 NOTE — Progress Notes (Deleted)
Office Visit Note  Patient: Jenny Bradford             Date of Birth: 20-Oct-1950           MRN: [id number]             PCP: [name], FNP Referring: [name], FNP Visit Date: 08/17/2020 Occupation: @GUAROCC @  Subjective:  No chief complaint on file.   History of Present Illness: Jenny Bradford is a 70 y.o. female ***   Activities of Daily Living:  Patient reports morning stiffness for *** {minute/hour:19697}.   Patient {ACTIONS;DENIES/REPORTS:21021675::"Denies"} nocturnal pain.  Difficulty dressing/grooming: {ACTIONS;DENIES/REPORTS:21021675::"Denies"} Difficulty climbing stairs: {ACTIONS;DENIES/REPORTS:21021675::"Denies"} Difficulty getting out of chair: {ACTIONS;DENIES/REPORTS:21021675::"Denies"} Difficulty using hands for taps, buttons, cutlery, and/or writing: {ACTIONS;DENIES/REPORTS:21021675::"Denies"}  No Rheumatology ROS completed.   PMFS History:  Patient Active Problem List   Diagnosis Date Noted  . Primary osteoarthritis of left knee 01/26/2019  . Primary osteoarthritis of right knee 01/26/2019  . Palpitations 09/04/2017  . Family history of coronary artery disease in father 09/04/2017  . Paraesophageal hernia 05/13/2017  . Neck pain 01/03/2017  . Large hiatal hernia 12/28/2016  . Multiple pulmonary nodules determined by computed tomography of lung 12/27/2016  . Arthritis of carpometacarpal [facility]) joint of left thumb 05/28/2016  . OSA (obstructive sleep apnea) 10/05/2012  . HTN (hypertension) 10/05/2012  . Hyperlipidemia 10/05/2012  . Migraine headache 10/05/2012    Past Medical History:  Diagnosis Date  . AAA (abdominal aortic aneurysm) ([place]) 08/2014   scanning every 2 years  . Arthritis    oa  . Complication of anesthesia    slow to awaken in past  . Family history of anesthesia complication    slow to awaken  . GERD (gastroesophageal reflux disease)   . Grade II diastolic dysfunction 31/5400   Noted on ECHO  . H/O  hiatal hernia   . Headache(784.0)    migraines (rare)  . Hemorrhoids   . Hyperlipidemia   . Hypertension 04/01/11   ECHO-EF>55% NUC STRESS TEST- 05/01/12  . Hypothyroidism   . Mild aortic valve regurgitation 04/2017   Noted on ECHO  . Mitral valve regurgitation 04/2017   Mild: Noted on ECHO  . Pulmonary nodules 2018   scanning every 6 months  . Sleep apnea 05/01/12 & 05/29/12   SLEEP STUDY-[place] [facility], NO CPAP USED SINCE MAY 2015 Uses oral device    Family History  Problem Relation Age of Onset  . Breast cancer Mother   . Ovarian cancer Mother   . Rheum arthritis Mother   . Heart attack Father   . Heart disease Father   . Hypertension Brother   . Heart disease Brother   . Hyperlipidemia Maternal Grandmother   . Hypertension Maternal Grandmother   . Polymyalgia rheumatica Paternal Aunt   . Hypertension Son   . Migraines Daughter   . Raynaud syndrome Daughter    Past Surgical History:  Procedure Laterality Date  . ANKLE SURGERY Right 1980  . CARPOMETACARPEL SUSPENSION PLASTY Right 12/14/2014   Procedure: RIGHT THUMB CARPOMETACARPAL ([place]) ARTHROPLASTY;  Surgeon: Leandrew Koyanagi, MD;  Location: [place];  Service: Orthopedics;  Laterality: Right;  . CARPOMETACARPEL SUSPENSION PLASTY Left 03/19/2017   Procedure: Left Thumb Ligament Reconstruction and Tendon Interposition;  Surgeon: Leandrew Koyanagi, MD;  Location: [place];  Service: Orthopedics;  Laterality: Left;  . COLONOSCOPY WITH PROPOFOL N/A 12/07/2013   Procedure: COLONOSCOPY WITH PROPOFOL;  Surgeon: Garlan Fair,  MD;  [facility];  Service: Endoscopy;  Laterality: N/A;  . CYSTECTOMY  1975  . FOOT SURGERY Right 9.23.14  . FOOT SURGERY Left 10.01.13  . [place]  2005  . HERNIA REPAIR  2019  . Miscarriage  26  . TONSILLECTOMY  1964   Social History   Social History Narrative  . Not on file   Immunization History  Administered Date(s) Administered   . Influenza Whole 11/09/2016  . Influenza,inj,quad, With Preservative 12/15/2013  . Influenza-Unspecified 11/10/2014, 11/09/2017  . Zoster Recombinat (Shingrix) 05/12/2018     Objective: Vital Signs: There were no vitals taken for this visit.   Physical Exam   Musculoskeletal Exam: ***  CDAI Exam: CDAI Score: -- Patient Global: --; Provider Global: -- Swollen: --; Tender: -- Joint Exam 08/17/2020   No joint exam has been documented for this visit   There is currently no information documented on the homunculus. Go to the Rheumatology activity and complete the homunculus joint exam.  Investigation: No additional findings.  Imaging: No results found.  Recent Labs: Lab Results  Component Value Date   WBC 5.8 06/18/2019   HGB 13.6 06/18/2019   PLT 248 06/18/2019   NA 136 06/18/2019   K 4.5 06/18/2019   CL 103 06/18/2019   CO2 25 06/18/2019   GLUCOSE 71 06/18/2019   BUN 21 06/18/2019   CREATININE 0.89 06/18/2019   BILITOT 0.7 06/18/2019   ALKPHOS 71 11/23/2018   AST 17 06/18/2019   ALT 21 06/18/2019   PROT 6.5 06/18/2019   ALBUMIN 4.0 11/23/2018   CALCIUM 9.3 06/18/2019   GFRAA 74 11/23/2018    Speciality Comments: No specialty comments available.  Procedures:  No procedures performed Allergies: Midol [aspirin-cinnamedrine-caffeine], Olmesartan, Talwin [pentazocine], Bisoprolol, and Naproxen   Assessment / Plan:     Visit Diagnoses: Positive ANA (antinuclear antibody)  Other fatigue  Shortness of breath  DDD (degenerative disc disease), cervical  Arthritis of carpometacarpal (CMC) joint of left thumb  Primary osteoarthritis of both knees  Essential hypertension  History of hyperlipidemia  Hx of migraines  OSA (obstructive sleep apnea)  Paraesophageal hernia  Family history of rheumatoid arthritis  Family history of Raynaud's phenomenon  Family history of polymyalgia rheumatica  Family history of coronary artery disease in  father  Orders: No orders of the defined types were placed in this encounter.  No orders of the defined types were placed in this encounter.   Face-to-face time spent with patient was *** minutes. Greater than 50% of time was spent in counseling and coordination of care.  Follow-Up Instructions: No follow-ups on file.   [name] Neas, PA-C  Note - This record has been created using Dragon software.  Chart creation errors have been sought, but may not always  have been located. Such creation errors do not reflect on  the standard of medical care.

## 2020-08-06 MED FILL — Escitalopram Oxalate Tab 20 MG (Base Equiv): ORAL | 90 days supply | Qty: 90 | Fill #0 | Status: AC

## 2020-08-08 ENCOUNTER — Other Ambulatory Visit (HOSPITAL_COMMUNITY): Payer: Self-pay

## 2020-08-09 ENCOUNTER — Other Ambulatory Visit (HOSPITAL_COMMUNITY): Payer: Self-pay

## 2020-08-14 DIAGNOSIS — R399 Unspecified symptoms and signs involving the genitourinary system: Secondary | ICD-10-CM | POA: Diagnosis not present

## 2020-08-17 ENCOUNTER — Ambulatory Visit: Payer: Medicare Other | Admitting: Rheumatology

## 2020-08-17 DIAGNOSIS — R5383 Other fatigue: Secondary | ICD-10-CM

## 2020-08-17 DIAGNOSIS — I10 Essential (primary) hypertension: Secondary | ICD-10-CM

## 2020-08-17 DIAGNOSIS — R0602 Shortness of breath: Secondary | ICD-10-CM

## 2020-08-17 DIAGNOSIS — K449 Diaphragmatic hernia without obstruction or gangrene: Secondary | ICD-10-CM

## 2020-08-17 DIAGNOSIS — M5030 Other cervical disc degeneration, unspecified cervical region: Secondary | ICD-10-CM

## 2020-08-17 DIAGNOSIS — Z8269 Family history of other diseases of the musculoskeletal system and connective tissue: Secondary | ICD-10-CM

## 2020-08-17 DIAGNOSIS — M1812 Unilateral primary osteoarthritis of first carpometacarpal joint, left hand: Secondary | ICD-10-CM

## 2020-08-17 DIAGNOSIS — Z8669 Personal history of other diseases of the nervous system and sense organs: Secondary | ICD-10-CM

## 2020-08-17 DIAGNOSIS — G4733 Obstructive sleep apnea (adult) (pediatric): Secondary | ICD-10-CM

## 2020-08-17 DIAGNOSIS — Z8639 Personal history of other endocrine, nutritional and metabolic disease: Secondary | ICD-10-CM

## 2020-08-17 DIAGNOSIS — Z8261 Family history of arthritis: Secondary | ICD-10-CM

## 2020-08-17 DIAGNOSIS — Z8249 Family history of ischemic heart disease and other diseases of the circulatory system: Secondary | ICD-10-CM

## 2020-08-17 DIAGNOSIS — M170 Bilateral primary osteoarthritis of knee: Secondary | ICD-10-CM

## 2020-08-17 DIAGNOSIS — R768 Other specified abnormal immunological findings in serum: Secondary | ICD-10-CM

## 2020-08-17 NOTE — Progress Notes (Signed)
Office Visit Note  Patient: Jenny Bradford             Date of Birth: Aug 05, 1950           MRN: [id number]             PCP: [name], FNP Referring: [name], FNP Visit Date: 08/22/2020 Occupation: @GUAROCC @  Subjective:  Joint pain, positive ANA   History of Present Illness: Jenny Bradford is a 70 y.o. female with history of positive ANA and osteoarthritis.  She states she continues to have some pain and stiffness in her bilateral hands.  She also has right knee joint.  She was given a [employer] brace by Dr. Sherrian Divers which she has been wearing and its been helpful.  She denies any history of skin tightness, reflux or Raynaud's phenomenon.  She states that she has been getting recurrent urinary tract infections.  Activities of Daily Living:  Patient reports morning stiffness for  15 minutes.   Patient Reports nocturnal pain.  Difficulty dressing/grooming: Denies Difficulty climbing stairs: Reports Difficulty getting out of chair: Denies Difficulty using hands for taps, buttons, cutlery, and/or writing: Reports  Review of Systems  Constitutional:  Positive for fatigue.  HENT:  Negative for mouth sores, mouth dryness and nose dryness.   Eyes:  Negative for pain, itching and dryness.  Respiratory:  Negative for shortness of breath and difficulty breathing.   Cardiovascular:  Negative for chest pain and palpitations.  Gastrointestinal:  Positive for constipation. Negative for blood in stool and diarrhea.  Endocrine: Positive for increased urination.  Genitourinary:  Negative for difficulty urinating.  Musculoskeletal:  Positive for joint pain, joint pain and morning stiffness. Negative for joint swelling, myalgias, muscle tenderness and myalgias.  Skin:  Negative for color change, rash and redness.  Allergic/Immunologic: Positive for susceptible to infections.  Neurological:  Positive for headaches. Negative for dizziness, numbness, memory loss and weakness.   Hematological:  Negative for bruising/bleeding tendency.  Psychiatric/Behavioral:  Negative for confusion.    PMFS History:  Patient Active Problem List   Diagnosis Date Noted   Primary osteoarthritis of left knee 01/26/2019   Primary osteoarthritis of right knee 01/26/2019   Palpitations 09/04/2017   Family history of coronary artery disease in father 09/04/2017   Paraesophageal hernia 05/13/2017   Neck pain 01/03/2017   Large hiatal hernia 12/28/2016   Multiple pulmonary nodules determined by computed tomography of lung 12/27/2016   Arthritis of carpometacarpal (CMC) joint of left thumb 05/28/2016   OSA (obstructive sleep apnea) 10/05/2012   HTN (hypertension) 10/05/2012   Hyperlipidemia 10/05/2012   Migraine headache 10/05/2012    Past Medical History:  Diagnosis Date   AAA (abdominal aortic aneurysm) ([place]) 08/2014   scanning every 2 years   Arthritis    oa   Complication of anesthesia    slow to awaken in past   Family history of anesthesia complication    slow to awaken   GERD (gastroesophageal reflux disease)    Grade II diastolic dysfunction 54/6270   Noted on ECHO   H/O hiatal hernia    Headache(784.0)    migraines (rare)   Hemorrhoids    Hyperlipidemia    Hypertension 04/01/11   ECHO-EF>55% NUC STRESS TEST- 05/01/12   Hypothyroidism    Mild aortic valve regurgitation 04/2017   Noted on ECHO   Mitral valve regurgitation 04/2017   Mild: Noted on ECHO   Pulmonary nodules 2018   scanning every 6 months  Sleep apnea 05/01/12 & 05/29/12   SLEEP STUDY- HEART AND SLEEP, NO CPAP USED SINCE MAY 2015 Uses oral device    Family History  Problem Relation Age of Onset   Breast cancer Mother    Ovarian cancer Mother    Rheum arthritis Mother    Heart attack Father    Heart disease Father    Hypertension Brother    Heart disease Brother    Hyperlipidemia Maternal Grandmother    Hypertension Maternal Grandmother    Polymyalgia rheumatica Paternal Aunt     Hypertension Son    Migraines Daughter    Raynaud syndrome Daughter    Past Surgical History:  Procedure Laterality Date   ANKLE SURGERY Right 1980   CARPOMETACARPEL SUSPENSION PLASTY Right 12/14/2014   Procedure: RIGHT THUMB CARPOMETACARPAL ([place]) ARTHROPLASTY;  Surgeon: Leandrew Koyanagi, MD;  Location: [place];  Service: Orthopedics;  Laterality: Right;   CARPOMETACARPEL SUSPENSION PLASTY Left 03/19/2017   Procedure: Left Thumb Ligament Reconstruction and Tendon Interposition;  Surgeon: Leandrew Koyanagi, MD;  Location: [place];  Service: Orthopedics;  Laterality: Left;   COLONOSCOPY WITH PROPOFOL N/A 12/07/2013   Procedure: COLONOSCOPY WITH PROPOFOL;  Surgeon: Garlan Fair, MD;  [facility];  Service: Endoscopy;  Laterality: N/A;   CYSTECTOMY  1975   FOOT SURGERY Right 12/01/2012   FOOT SURGERY Left 12/10/2011   HEMORRHOID SURGERY  2005   HERNIA REPAIR  2019   Miscarriage  1983   TONSILLECTOMY  1964   VAGINAL HYSTERECTOMY     With pelvic floor repair   Social History   Social History Narrative   Not on file   Immunization History  Administered Date(s) Administered   Influenza Whole 11/09/2016   Influenza,inj,quad, With Preservative 12/15/2013   Influenza-Unspecified 11/10/2014, 11/09/2017   Zoster Recombinat (Shingrix) 05/12/2018     Objective: Vital Signs: BP 111/77 (BP Location: Left Arm, Patient Position: Sitting, Cuff Size: Small)   Pulse (!) 58   Resp 12   Ht 5\' 3"  (1.6 m)   Wt 161 lb 3.2 oz (73.1 kg)   BMI 28.56 kg/m    Physical Exam Vitals and nursing note reviewed.  Constitutional:      Appearance: She is well-developed.  HENT:     Head: Normocephalic and atraumatic.  Eyes:     Conjunctiva/sclera: Conjunctivae normal.  Cardiovascular:     Rate and Rhythm: Normal rate and regular rhythm.     Heart sounds: Normal heart sounds.  Pulmonary:     Effort: Pulmonary effort is normal.     Breath sounds: Normal  breath sounds.  Abdominal:     General: Bowel sounds are normal.     Palpations: Abdomen is soft.  Musculoskeletal:     Cervical back: Normal range of motion.  Lymphadenopathy:     Cervical: No cervical adenopathy.  Skin:    General: Skin is warm and dry.     Capillary Refill: Capillary refill takes less than 2 seconds.     Comments: No nailbed capillary changes were noted.  No sclerodactyly was noted.  No telangiectasias were noted.  Neurological:     Mental Status: She is alert and oriented to person, place, and time.  Psychiatric:        Behavior: Behavior normal.     Musculoskeletal Exam: C-spine was in good range of motion.  Shoulder joints, elbow joints, wrist joints with good range of motion.  She had bilateral CMC, PIP and DIP thickening with  no synovitis.  Hip joints, knee joints, ankles, MTPs with good range of motion with no synovitis.  CDAI Exam: CDAI Score: -- Patient Global: --; Provider Global: -- Swollen: --; Tender: -- Joint Exam 08/22/2020   No joint exam has been documented for this visit   There is currently no information documented on the homunculus. Go to the Rheumatology activity and complete the homunculus joint exam.  Investigation: No additional findings.  Imaging: MM 3D SCREEN BREAST BILATERAL  Result Date: 08/04/2020 CLINICAL DATA:  Screening. EXAM: DIGITAL SCREENING BILATERAL MAMMOGRAM WITH TOMOSYNTHESIS AND CAD TECHNIQUE: Bilateral screening digital craniocaudal and mediolateral oblique mammograms were obtained. Bilateral screening digital breast tomosynthesis was performed. The images were evaluated with computer-aided detection. COMPARISON:  Previous exam(s). ACR Breast Density Category b: There are scattered areas of fibroglandular density. FINDINGS: There are no findings suspicious for malignancy. The images were evaluated with computer-aided detection. IMPRESSION: No mammographic evidence of malignancy. A result letter of this screening  mammogram will be mailed directly to the patient. RECOMMENDATION: Screening mammogram in one year. (Code:SM-B-01Y) BI-RADS CATEGORY  1: Negative. Electronically Signed   By: Fidela Salisbury M.D.   On: 08/04/2020 15:38    Recent Labs: Lab Results  Component Value Date   WBC 5.8 06/18/2019   HGB 13.6 06/18/2019   PLT 248 06/18/2019   NA 136 06/18/2019   K 4.5 06/18/2019   CL 103 06/18/2019   CO2 25 06/18/2019   GLUCOSE 71 06/18/2019   BUN 21 06/18/2019   CREATININE 0.89 06/18/2019   BILITOT 0.7 06/18/2019   ALKPHOS 71 11/23/2018   AST 17 06/18/2019   ALT 21 06/18/2019   PROT 6.5 06/18/2019   ALBUMIN 4.0 11/23/2018   CALCIUM 9.3 06/18/2019   GFRAA 74 11/23/2018    Speciality Comments: No specialty comments available.  Jul 19, 2020 AVISE lupus index -0.0, ANA 1: 2560 centromere pattern, ENA negative, Jo 1 negative, CB CAP negative, anticardiolipin negative, antibeta-2 GP 1 negative, antiphosphatidylserine negative, rheumatoid factor negative, anti-CCP negative, anti-Carr P-, anti-TPO positive, antithyroglobulin positive, antihistone weak positive.  Procedures:  No procedures performed Allergies: Midol [aspirin-cinnamedrine-caffeine], Olmesartan, Talwin [pentazocine], Bisoprolol, and Naproxen   Assessment / Plan:     Visit Diagnoses: Positive ANA (antinuclear antibody) -ANA antibody centromere pattern continues to be positive.  Most recentAVISE labs were reviewed with the patient.  All other autoimmune antibodies were negative except for anti-TPO and antithyroglobulin antibodies.  She has no clinical features of scleroderma.  There is no history of Raynauds.  There was no evidence of sclerodactyly, telangiectasias or nailbed capillary changes on my examination.  Have advised patient to contact me in case she develops any new symptoms.  Shortness of breath - CT on 09/07/2018 for yearly surveillance of a pulmonary nodule.  Results revealed groundglass in part solid nodules in the  lungs bilaterally-stable. Dr. Melvyn Novas  DDD (degenerative disc disease), cervical-she continues to have some stiffness in her neck.  Arthritis of carpometacarpal (CMC) joint of left thumb-joint protection muscle strengthening was discussed  Primary osteoarthritis of both knees-lower extremity muscle strengthening was discussed.  Other medical problems are listed as follows:  Essential hypertension  History of hyperlipidemia  Hx of migraines  Paraesophageal hernia  OSA (obstructive sleep apnea)  Family history of rheumatoid arthritis  Family history of Raynaud's phenomenon  Family history of polymyalgia rheumatica  Family history of coronary artery disease in father  Orders: No orders of the defined types were placed in this encounter.  No orders of the defined  types were placed in this encounter.    Follow-Up Instructions: Return in about 1 year (around 08/22/2021) for +ANA.   Bo Merino, MD  Note - This record has been created using Editor, commissioning.  Chart creation errors have been sought, but may not always  have been located. Such creation errors do not reflect on  the standard of medical care.

## 2020-08-22 ENCOUNTER — Encounter: Payer: Self-pay | Admitting: Rheumatology

## 2020-08-22 ENCOUNTER — Other Ambulatory Visit: Payer: Self-pay

## 2020-08-22 ENCOUNTER — Ambulatory Visit (INDEPENDENT_AMBULATORY_CARE_PROVIDER_SITE_OTHER): Payer: Medicare Other | Admitting: Rheumatology

## 2020-08-22 VITALS — BP 111/77 | HR 58 | Resp 12 | Ht 63.0 in | Wt 161.2 lb

## 2020-08-22 DIAGNOSIS — Z8639 Personal history of other endocrine, nutritional and metabolic disease: Secondary | ICD-10-CM

## 2020-08-22 DIAGNOSIS — M170 Bilateral primary osteoarthritis of knee: Secondary | ICD-10-CM | POA: Diagnosis not present

## 2020-08-22 DIAGNOSIS — Z8261 Family history of arthritis: Secondary | ICD-10-CM

## 2020-08-22 DIAGNOSIS — Z8249 Family history of ischemic heart disease and other diseases of the circulatory system: Secondary | ICD-10-CM | POA: Diagnosis not present

## 2020-08-22 DIAGNOSIS — G4733 Obstructive sleep apnea (adult) (pediatric): Secondary | ICD-10-CM | POA: Diagnosis not present

## 2020-08-22 DIAGNOSIS — K449 Diaphragmatic hernia without obstruction or gangrene: Secondary | ICD-10-CM

## 2020-08-22 DIAGNOSIS — M5030 Other cervical disc degeneration, unspecified cervical region: Secondary | ICD-10-CM

## 2020-08-22 DIAGNOSIS — M1812 Unilateral primary osteoarthritis of first carpometacarpal joint, left hand: Secondary | ICD-10-CM | POA: Diagnosis not present

## 2020-08-22 DIAGNOSIS — R0602 Shortness of breath: Secondary | ICD-10-CM | POA: Diagnosis not present

## 2020-08-22 DIAGNOSIS — I10 Essential (primary) hypertension: Secondary | ICD-10-CM

## 2020-08-22 DIAGNOSIS — Z8269 Family history of other diseases of the musculoskeletal system and connective tissue: Secondary | ICD-10-CM

## 2020-08-22 DIAGNOSIS — Z8669 Personal history of other diseases of the nervous system and sense organs: Secondary | ICD-10-CM | POA: Diagnosis not present

## 2020-08-22 DIAGNOSIS — R768 Other specified abnormal immunological findings in serum: Secondary | ICD-10-CM | POA: Diagnosis not present

## 2020-08-22 NOTE — Patient Instructions (Signed)
Journal for Nurse Practitioners, 15(4), 263-267. Retrieved December 15, 2017 from [email or web address].com/nursing">  Knee Exercises Ask your health care provider which exercises are safe for you. Do exercises exactly as told by your health care provider and adjust them as directed. It is normal to feel mild stretching, pulling, tightness, or discomfort as you do these exercises. Stop right away if you feel sudden pain or your pain gets worse. Do not begin these exercises until told by your health care provider. Stretching and range-of-motion exercises These exercises warm up your muscles and joints and improve the movement and flexibility of your knee. These exercises also help to relieve pain andswelling. Knee extension, prone Lie on your abdomen (prone position) on a bed. Place your left / right knee just beyond the edge of the surface so your knee is not on the bed. You can put a towel under your left / right thigh just above your kneecap for comfort. Relax your leg muscles and allow gravity to straighten your knee (extension). You should feel a stretch behind your left / right knee. Hold this position for __________ seconds. Scoot up so your knee is supported between repetitions. Repeat __________ times. Complete this exercise __________ times a day. Knee flexion, active  Lie on your back with both legs straight. If this causes back discomfort, bend your left / right knee so your foot is flat on the floor. Slowly slide your left / right heel back toward your buttocks. Stop when you feel a gentle stretch in the front of your knee or thigh (flexion). Hold this position for __________ seconds. Slowly slide your left / right heel back to the starting position. Repeat __________ times. Complete this exercise __________ times a day. Quadriceps stretch, prone  Lie on your abdomen on a firm surface, such as a bed or padded floor. Bend your left / right knee and hold your ankle. If you cannot reach  your ankle or pant leg, loop a belt around your foot and grab the belt instead. Gently pull your heel toward your buttocks. Your knee should not slide out to the side. You should feel a stretch in the front of your thigh and knee (quadriceps). Hold this position for __________ seconds. Repeat __________ times. Complete this exercise __________ times a day. Hamstring, supine Lie on your back (supine position). Loop a belt or towel over the ball of your left / right foot. The ball of your foot is on the walking surface, right under your toes. Straighten your left / right knee and slowly pull on the belt to raise your leg until you feel a gentle stretch behind your knee (hamstring). Do not let your knee bend while you do this. Keep your other leg flat on the floor. Hold this position for __________ seconds. Repeat __________ times. Complete this exercise __________ times a day. Strengthening exercises These exercises build strength and endurance in your knee. Endurance is theability to use your muscles for a long time, even after they get tired. Quadriceps, isometric This exercise stretches the muscles in front of your thigh (quadriceps) without moving your knee joint (isometric). Lie on your back with your left / right leg extended and your other knee bent. Put a rolled towel or small pillow under your knee if told by your health care provider. Slowly tense the muscles in the front of your left / right thigh. You should see your kneecap slide up toward your hip or see increased dimpling just above the knee. This motion will   push the back of the knee toward the floor. For __________ seconds, hold the muscle as tight as you can without increasing your pain. Relax the muscles slowly and completely. Repeat __________ times. Complete this exercise __________ times a day. Straight leg raises This exercise stretches the muscles in front of your thigh (quadriceps) and the muscles that move your hips (hip  flexors). Lie on your back with your left / right leg extended and your other knee bent. Tense the muscles in the front of your left / right thigh. You should see your kneecap slide up or see increased dimpling just above the knee. Your thigh may even shake a bit. Keep these muscles tight as you raise your leg 4-6 inches (10-15 cm) off the floor. Do not let your knee bend. Hold this position for __________ seconds. Keep these muscles tense as you lower your leg. Relax your muscles slowly and completely after each repetition. Repeat __________ times. Complete this exercise __________ times a day. Hamstring, isometric Lie on your back on a firm surface. Bend your left / right knee about __________ degrees. Dig your left / right heel into the surface as if you are trying to pull it toward your buttocks. Tighten the muscles in the back of your thighs (hamstring) to "dig" as hard as you can without increasing any pain. Hold this position for __________ seconds. Release the tension gradually and allow your muscles to relax completely for __________ seconds after each repetition. Repeat __________ times. Complete this exercise __________ times a day. Hamstring curls If told by your health care provider, do this exercise while wearing ankle weights. Begin with __________ lb weights. Then increase the weight by 1 lb (0.5 kg) increments. Do not wear ankle weights that are more than __________ lb. Lie on your abdomen with your legs straight. Bend your left / right knee as far as you can without feeling pain. Keep your hips flat against the floor. Hold this position for __________ seconds. Slowly lower your leg to the starting position. Repeat __________ times. Complete this exercise __________ times a day. Squats This exercise strengthens the muscles in front of your thigh and knee (quadriceps). Stand in front of a table, with your feet and knees pointing straight ahead. You may rest your hands on the  table for balance but not for support. Slowly bend your knees and lower your hips like you are going to sit in a chair. Keep your weight over your heels, not over your toes. Keep your lower legs upright so they are parallel with the table legs. Do not let your hips go lower than your knees. Do not bend lower than told by your health care provider. If your knee pain increases, do not bend as low. Hold the squat position for __________ seconds. Slowly push with your legs to return to standing. Do not use your hands to pull yourself to standing. Repeat __________ times. Complete this exercise __________ times a day. Wall slides This exercise strengthens the muscles in front of your thigh and knee (quadriceps). Lean your back against a smooth wall or door, and walk your feet out 18-24 inches (46-61 cm) from it. Place your feet hip-width apart. Slowly slide down the wall or door until your knees bend __________ degrees. Keep your knees over your heels, not over your toes. Keep your knees in line with your hips. Hold this position for __________ seconds. Repeat __________ times. Complete this exercise __________ times a day. Straight leg raises This exercise   strengthens the muscles that rotate the leg at the hip and move it away from your body (hip abductors). Lie on your side with your left / right leg in the top position. Lie so your head, shoulder, knee, and hip line up. You may bend your bottom knee to help you keep your balance. Roll your hips slightly forward so your hips are stacked directly over each other and your left / right knee is facing forward. Leading with your heel, lift your top leg 4-6 inches (10-15 cm). You should feel the muscles in your outer hip lifting. Do not let your foot drift forward. Do not let your knee roll toward the ceiling. Hold this position for __________ seconds. Slowly return your leg to the starting position. Let your muscles relax completely after each  repetition. Repeat __________ times. Complete this exercise __________ times a day. Straight leg raises This exercise stretches the muscles that move your hips away from the front of the pelvis (hip extensors). Lie on your abdomen on a firm surface. You can put a pillow under your hips if that is more comfortable. Tense the muscles in your buttocks and lift your left / right leg about 4-6 inches (10-15 cm). Keep your knee straight as you lift your leg. Hold this position for __________ seconds. Slowly lower your leg to the starting position. Let your leg relax completely after each repetition. Repeat __________ times. Complete this exercise __________ times a day. This information is not intended to replace advice given to you by your health care provider. Make sure you discuss any questions you have with your healthcare provider. Document Revised: 12/16/2017 Document Reviewed: 12/16/2017 Elsevier Patient Education  2022 [employer] Hand Exercises Hand exercises can be helpful for almost anyone. These exercises can strengthen the hands, improve flexibility and movement, and increase blood flow to the hands. These results can make work and daily tasks easier. Hand exercises can be especially helpful for people who have joint pain from arthritis or have nerve damage from overuse (carpal tunnel syndrome). These exercises can also help people who have injured a hand. Exercises Most of these hand exercises are gentle stretching and motion exercises. It is usually safe to do them often throughout the day. Warming up your hands before exercise may help to reduce stiffness. You can do this with gentle massage orby placing your hands in warm water for 10-15 minutes. It is normal to feel some stretching, pulling, tightness, or mild discomfort as you begin new exercises. This will gradually improve. Stop an exercise right away if you feel sudden, severe pain or your pain gets worse. Ask your healthcare  provider which exercises are best for you. Knuckle bend or "claw" fist Stand or sit with your arm, hand, and all five fingers pointed straight up. Make sure to keep your wrist straight during the exercise. Gently bend your fingers down toward your palm until the tips of your fingers are touching the top of your palm. Keep your big knuckle straight and just bend the small knuckles in your fingers. Hold this position for __________ seconds. Straighten (extend) your fingers back to the starting position. Repeat this exercise 5-10 times with each hand. Full finger fist Stand or sit with your arm, hand, and all five fingers pointed straight up. Make sure to keep your wrist straight during the exercise. Gently bend your fingers into your palm until the tips of your fingers are touching the middle of your palm. Hold this position for __________ seconds.   Extend your fingers back to the starting position, stretching every joint fully. Repeat this exercise 5-10 times with each hand. Straight fist Stand or sit with your arm, hand, and all five fingers pointed straight up. Make sure to keep your wrist straight during the exercise. Gently bend your fingers at the big knuckle, where your fingers meet your hand, and the middle knuckle. Keep the knuckle at the tips of your fingers straight and try to touch the bottom of your palm. Hold this position for __________ seconds. Extend your fingers back to the starting position, stretching every joint fully. Repeat this exercise 5-10 times with each hand. Tabletop Stand or sit with your arm, hand, and all five fingers pointed straight up. Make sure to keep your wrist straight during the exercise. Gently bend your fingers at the big knuckle, where your fingers meet your hand, as far down as you can while keeping the small knuckles in your fingers straight. Think of forming a tabletop with your fingers. Hold this position for __________ seconds. Extend your fingers  back to the starting position, stretching every joint fully. Repeat this exercise 5-10 times with each hand. Finger spread Place your hand flat on a table with your palm facing down. Make sure your wrist stays straight as you do this exercise. Spread your fingers and thumb apart from each other as far as you can until you feel a gentle stretch. Hold this position for __________ seconds. Bring your fingers and thumb tight together again. Hold this position for __________ seconds. Repeat this exercise 5-10 times with each hand. Making circles Stand or sit with your arm, hand, and all five fingers pointed straight up. Make sure to keep your wrist straight during the exercise. Make a circle by touching the tip of your thumb to the tip of your index finger. Hold for __________ seconds. Then open your hand wide. Repeat this motion with your thumb and each finger on your hand. Repeat this exercise 5-10 times with each hand. Thumb motion Sit with your forearm resting on a table and your wrist straight. Your thumb should be facing up toward the ceiling. Keep your fingers relaxed as you move your thumb. Lift your thumb up as high as you can toward the ceiling. Hold for __________ seconds. Bend your thumb across your palm as far as you can, reaching the tip of your thumb for the small finger (pinkie) side of your palm. Hold for __________ seconds. Repeat this exercise 5-10 times with each hand. Grip strengthening  Hold a stress ball or other soft ball in the middle of your hand. Slowly increase the pressure, squeezing the ball as much as you can without causing pain. Think of bringing the tips of your fingers into the middle of your palm. All of your finger joints should bend when doing this exercise. Hold your squeeze for __________ seconds, then relax. Repeat this exercise 5-10 times with each hand. Contact a health care provider if: Your hand pain or discomfort gets much worse when you do an  exercise. Your hand pain or discomfort does not improve within 2 hours after you exercise. If you have any of these problems, stop doing these exercises right away. Do not do them again unless your health care provider says that you can. Get help right away if: You develop sudden, severe hand pain or swelling. If this happens, stop doing these exercises right away. Do not do them again unless your health care provider says that you can. This   information is not intended to replace advice given to you by your health care provider. Make sure you discuss any questions you have with your healthcare provider. Document Revised: 06/18/2018 Document Reviewed: 02/26/2018 Elsevier Patient Education  2022 [employer] Inc[employer]  

## 2020-08-30 ENCOUNTER — Ambulatory Visit (HOSPITAL_COMMUNITY)
Admission: RE | Admit: 2020-08-30 | Discharge: 2020-08-30 | Disposition: A | Discharge status: Home / Self Care | Payer: Medicare Other | Source: Ambulatory Visit | Attending: Vascular Surgery | Admitting: Vascular Surgery

## 2020-08-30 ENCOUNTER — Other Ambulatory Visit: Payer: Self-pay

## 2020-08-30 ENCOUNTER — Ambulatory Visit (INDEPENDENT_AMBULATORY_CARE_PROVIDER_SITE_OTHER): Payer: Medicare Other | Admitting: Physician Assistant

## 2020-08-30 VITALS — BP 140/77 | HR 54 | Temp 98.0°F | Resp 14 | Ht 63.0 in | Wt 161.7 lb

## 2020-08-30 DIAGNOSIS — I714 Abdominal aortic aneurysm, without rupture, unspecified: Secondary | ICD-10-CM

## 2020-08-30 NOTE — Progress Notes (Deleted)
    Virtual Visit via Telephone Note    I connected with Jenny Bradford on 08/30/2020 using the [email or web address] by telephone and verified that I was speaking with the correct person using two identifiers. Patient was located at *** and accompanied by ***. I am located at ***.   The limitations of evaluation and management by telemedicine and the availability of in person appointments have been previously discussed with the patient and are documented in the patients chart. The patient expressed understanding and consented to proceed.  PCP: [name], FNP Referring MD: ***  Chief Complaint: ***  History of Present Illness: Jenny Bradford is a 70 y.o. female with ***  Past Medical History:  Diagnosis Date   AAA (abdominal aortic aneurysm) ([place]) 08/2014   scanning every 2 years   Arthritis    oa   Complication of anesthesia    slow to awaken in past   Family history of anesthesia complication    slow to awaken   GERD (gastroesophageal reflux disease)    Grade II diastolic dysfunction 89/2119   Noted on ECHO   H/O hiatal hernia    Headache(784.0)    migraines (rare)   Hemorrhoids    Hyperlipidemia    Hypertension 04/01/11   ECHO-EF>55% NUC STRESS TEST- 05/01/12   Hypothyroidism    Mild aortic valve regurgitation 04/2017   Noted on ECHO   Mitral valve regurgitation 04/2017   Mild: Noted on ECHO   Pulmonary nodules 2018   scanning every 6 months   Sleep apnea 05/01/12 & 05/29/12   SLEEP STUDY-[place] [facility], NO CPAP USED SINCE MAY 2015 Uses oral device  ***  Past Surgical History:  Procedure Laterality Date   ANKLE SURGERY Right [place] Right 12/14/2014   Procedure: RIGHT THUMB CARPOMETACARPAL ([place]) ARTHROPLASTY;  Surgeon: Leandrew Koyanagi, MD;  Location: [facility];  Service: Orthopedics;  Laterality: Right;   CARPOMETACARPEL SUSPENSION PLASTY Left 03/19/2017   Procedure: Left Thumb Ligament Reconstruction  and Tendon Interposition;  Surgeon: Leandrew Koyanagi, MD;  Location: [facility];  Service: Orthopedics;  Laterality: Left;   COLONOSCOPY WITH PROPOFOL N/A 12/07/2013   Procedure: COLONOSCOPY WITH PROPOFOL;  Surgeon: Garlan Fair, MD;  Location: [facility];  Service: Endoscopy;  Laterality: N/A;   CYSTECTOMY  1975   FOOT SURGERY Right 12/01/2012   FOOT SURGERY Left 12/10/2011   HEMORRHOID SURGERY  2005   HERNIA REPAIR  2019   Miscarriage  1983   TONSILLECTOMY  1964   VAGINAL HYSTERECTOMY     With pelvic floor repair  ***  No outpatient medications have been marked as taking for the 08/30/20 encounter (Appointment) with [facility].    12 system ROS was negative unless otherwise noted in HPI***   Observations/Objective:   Assessment and Plan:   Follow Up Instructions:   Follow up {follow up:15908}   I discussed the assessment and treatment plan with the patient. The patient was provided an opportunity to ask questions and all were answered. The patient agreed with the plan and demonstrated an understanding of the instructions.   The patient was advised to call back or seek an in-person evaluation if the symptoms worsen or if the condition fails to improve as anticipated.  I spent *** minutes with the patient via telephone encounter.   Signed, Roxy Horseman [facility] Office: [phone]  08/30/2020, 9:24 AM

## 2020-08-30 NOTE — Progress Notes (Signed)
[facility] [place] HISTORY AND PHYSICAL   History of Present Illness:  Patient is a 70 y.o. year old female who presents for evaluation of abdominal aortic aneurysm.  The aneurysm is currently 2.14cm in diameter by US/CT performed at [facility] on 09/07/18.  The patient denies abdominal pain.  The patient denies back pain.  The patient hahas family history of AAA.  Other medical problems include HTN and hyperlipidemia.   Past Medical History:  Diagnosis Date   AAA (abdominal aortic aneurysm) ([place]) 08/2014   scanning every 2 years   Arthritis    oa   Complication of anesthesia    slow to awaken in past   Family history of anesthesia complication    slow to awaken   GERD (gastroesophageal reflux disease)    Grade II diastolic dysfunction 16/0109   Noted on ECHO   H/O hiatal hernia    Headache(784.0)    migraines (rare)   Hemorrhoids    Hyperlipidemia    Hypertension 04/01/11   ECHO-EF>55% NUC STRESS TEST- 05/01/12   Hypothyroidism    Mild aortic valve regurgitation 04/2017   Noted on ECHO   Mitral valve regurgitation 04/2017   Mild: Noted on ECHO   Pulmonary nodules 2018   scanning every 6 months   Sleep apnea 05/01/12 & 05/29/12   SLEEP STUDY-[place] [facility], NO CPAP USED SINCE MAY 2015 Uses oral device    Past Surgical History:  Procedure Laterality Date   ANKLE SURGERY Right [place] Right 12/14/2014   Procedure: RIGHT THUMB CARPOMETACARPAL ([place]) ARTHROPLASTY;  Surgeon: Leandrew Koyanagi, MD;  Location: [facility];  Service: Orthopedics;  Laterality: Right;   CARPOMETACARPEL SUSPENSION PLASTY Left 03/19/2017   Procedure: Left Thumb Ligament Reconstruction and Tendon Interposition;  Surgeon: Leandrew Koyanagi, MD;  Location: [facility];  Service: Orthopedics;  Laterality: Left;   COLONOSCOPY WITH PROPOFOL N/A 12/07/2013   Procedure: COLONOSCOPY WITH PROPOFOL;  Surgeon: Garlan Fair, MD;   Location: [facility];  Service: Endoscopy;  Laterality: N/A;   CYSTECTOMY  1975   FOOT SURGERY Right 12/01/2012   FOOT SURGERY Left 12/10/2011   HEMORRHOID SURGERY  2005   HERNIA REPAIR  2019   Miscarriage  1983   TONSILLECTOMY  1964   VAGINAL HYSTERECTOMY     With pelvic floor repair     Social History Social History   Tobacco Use   Smoking status: Never   Smokeless tobacco: Never  Vaping Use   Vaping Use: Never used  Substance Use Topics   Alcohol use: No    Alcohol/week: 0.0 standard drinks   Drug use: No    Family History Family History  Problem Relation Age of Onset   Breast cancer Mother    Ovarian cancer Mother    Rheum arthritis Mother    Heart attack Father    Heart disease Father    Hypertension Brother    Heart disease Brother    Hyperlipidemia Maternal Grandmother    Hypertension Maternal Grandmother    Polymyalgia rheumatica Paternal Aunt    Hypertension Son    Migraines Daughter    Raynaud syndrome Daughter     Allergies  Allergies  Allergen Reactions   Midol [Aspirin-Cinnamedrine-Caffeine] Other (See Comments)    DIZZINESS   Olmesartan Nausea Only   Talwin [Pentazocine] Other (See Comments)    EXTREME DROWSINESS   Bisoprolol Other (See Comments)    weakness   Naproxen  Rash     Current Outpatient Medications  Medication Sig Dispense Refill   acetaminophen (TYLENOL) 500 MG tablet Take 500 mg by mouth 2 (two) times daily.     ALPRAZolam (XANAX) 0.25 MG tablet Take 0.25 mg by mouth at bedtime as needed for sleep.      ALPRAZolam (XANAX) 0.25 MG tablet TAKE 1 TABLET BY MOUTH EVERY NIGHT AT BEDTIME AS NEEDED 30 tablet 2   ALPRAZolam (XANAX) 0.25 MG tablet Take 1 tablet by mouth at bedtime as needed 30 tablet 2   Ascorbic Acid (VITAMIN C) 500 MG CAPS      butalbital-acetaminophen-caffeine (FIORICET) 50-325-40 MG tablet TAKE 1 TABLET BY MOUTH EVERY 4 HOURS AS DIRECTED 60 tablet 2   butalbital-acetaminophen-caffeine (FIORICET) 50-325-40 MG  tablet TAKE 1 TABLET BY MOUTH EVERY 4 HOURS AS DIRECTED 60 tablet 2   butalbital-acetaminophen-caffeine (FIORICET, ESGIC) 50-325-40 MG per tablet Take 1 tablet by mouth 2 (two) times daily as needed for headache.     candesartan (ATACAND) 16 MG tablet TAKE 1 TABLET BY MOUTH ONCE DAILY 90 tablet 2   Cholecalciferol (VITAMIN D) 2000 units tablet Take 2,000 Units by mouth daily.     D-Mannose 500 MG CAPS      Docusate Sodium 100 MG capsule Take 200 mg by mouth daily as needed for mild constipation. Takes 300 mg  as needed     escitalopram (LEXAPRO) 20 MG tablet Take 10-20 mg by mouth every morning. Hot flashes     escitalopram (LEXAPRO) 20 MG tablet TAKE 1 TABLET BY MOUTH ONCE DAILY 90 tablet 3   estradiol (ESTRACE) 0.1 MG/GM vaginal cream Place 2 g vaginally 2 (two) times a week. USE TWICE WEEKLY     ezetimibe (ZETIA) 10 MG tablet TAKE 1 TABLET BY MOUTH ONCE DAILY 90 tablet 3   hydrocortisone (ANUSOL-HC) 2.5 % rectal cream APPLY TO THE AFFECTED RECTAL AREA 2 TIMES DAILY 60 g 5   hydrocortisone 2.5 % cream Proctosol HC 2.5 % rectal cream with applicator     levothyroxine (SYNTHROID) 50 MCG tablet TAKE 1 TABLET BY MOUTH ONCE A DAY 90 tablet 2   levothyroxine (SYNTHROID) 50 MCG tablet Take 1 tablet (50 mcg total) by mouth daily. 90 tablet 0   levothyroxine (SYNTHROID, LEVOTHROID) 50 MCG tablet Take 50 mcg by mouth daily before breakfast.     Melatonin 3 MG CAPS 1 tablet at bedtime as needed     montelukast (SINGULAIR) 10 MG tablet TAKE 1 TABLET BY MOUTH EVERY DAY FOR 30 DAYS     PAC CRANBERRY PO      pravastatin (PRAVACHOL) 40 MG tablet TAKE 1/2 TABLET BY MOUTH EVERY EVENING 45 tablet 8   simethicone (MYLICON) 80 MG chewable tablet Chew 1 tablet (80 mg total) by mouth every 6 (six) hours as needed for flatulence (bloating). (Patient not taking: Reported on 08/22/2020) 30 tablet 0   spironolactone (ALDACTONE) 25 MG tablet TAKE 1/2 TABLET BY MOUTH DAILY, MAY USE AS NEEDED FOR SWELLING AS WELL 45 tablet 2    SUMAtriptan (IMITREX) 100 MG tablet TAKE 1 TABLET BY MOUTH AS NEEDED ONE TIME ONCE A DAY FOR 23 DAYS 9 tablet 3   Wheat Dextrin (BENEFIBER PO) daily.     No current facility-administered medications for this visit.    ROS:   General:  No weight loss, Fever, chills  HEENT: No recent headaches, no nasal bleeding, no visual changes, no sore throat  Neurologic: No dizziness, blackouts, seizures. No recent symptoms of stroke  or mini- stroke. No recent episodes of slurred speech, or temporary blindness.  Cardiac: No recent episodes of chest pain/pressure, no shortness of breath at rest.  No shortness of breath with exertion.  Denies history of atrial fibrillation or irregular heartbeat  Vascular: No history of rest pain in feet.  No history of claudication.  No history of non-healing ulcer, No history of DVT   Pulmonary: No home oxygen, no productive cough, no hemoptysis,  No asthma or wheezing  Musculoskeletal:  [ x] Arthritis, [ ]  Low back pain,  [ ]  Joint pain  Hematologic:No history of hypercoagulable state.  No history of easy bleeding.  No history of anemia  Gastrointestinal: No hematochezia or melena,  No gastroesophageal reflux, no trouble swallowing  Urinary: [ ]  chronic Kidney disease, [ ]  on HD - [ ]  MWF or [ ]  TTHS, [ ]  Burning with urination, [ ]  Frequent urination, [ ]  Difficulty urinating;   Skin: No rashes  Psychological: No history of anxiety,  No history of depression   Physical Examination  Vitals:   08/30/20 0927  BP: 140/77  Pulse: (!) 54  Resp: 14  Temp: 98 F (36.7 C)  TempSrc: Temporal  SpO2: 100%  Weight: 161 lb 11.2 oz (73.3 kg)  Height: 5\' 3"  (1.6 m)    Body mass index is 28.64 kg/m.  General:  Alert and oriented, no acute distress HEENT: Normal Neck: No bruit or JVD Pulmonary: Clear to auscultation bilaterally Cardiac: Regular Rate and Rhythm without murmur Gastrointestinal: Soft, non-tender, non-distended, no mass, no scars Skin: No  rash Extremity Pulses:  2+ radial, brachial, femoral, dorsalis pedis, posterior tibial pulses bilaterally Musculoskeletal: No deformity, chronic B feet edema  Neurologic: Upper and lower extremity motor 5/5 and symmetric  DATA:  Abdominal Aorta Findings:  +-----------+-------+----------+----------+--------+--------+--------+  Location   AP (cm)Trans (cm)PSV (cm/s)WaveformThrombusComments  +-----------+-------+----------+----------+--------+--------+--------+  Proximal   1.76   1.71      94                                  +-----------+-------+----------+----------+--------+--------+--------+  Mid        2.36   2.53      98                        saccular  +-----------+-------+----------+----------+--------+--------+--------+  Distal     1.86   2.11                                saccular  +-----------+-------+----------+----------+--------+--------+--------+  RT CIA Prox1.1    1.1       117                                 +-----------+-------+----------+----------+--------+--------+--------+  LT CIA Prox1.1    1.1       128                                 +-----------+-------+----------+----------+--------+--------+--------+   Summary:  Abdominal Aorta: No evidence of an abdominal aortic aneurysm was  visualized.  The largest aortic measurement is 2.5 cm. There is dilatation of the mid  and distal segments of the aorta.      ASSESSMENT: Surveillance of AAA without significant  change now measuring 2.5 cm saccular.  She remains asymptomatic.     PLAN:She will continue to walk for exercise, maintain good BP control and f/u for repeat surveillance in 2 years.  If she develops sudden back pain or abdominal pain she will call 911.   Roxy Horseman PA-C [facility] [place] Office: [phone]

## 2020-09-04 MED FILL — Butalbital-Acetaminophen-Caffeine Tab 50-325-40 MG: ORAL | 10 days supply | Qty: 60 | Fill #0 | Status: AC

## 2020-09-04 MED FILL — Ezetimibe Tab 10 MG: ORAL | 90 days supply | Qty: 90 | Fill #0 | Status: AC

## 2020-09-05 ENCOUNTER — Other Ambulatory Visit (HOSPITAL_COMMUNITY): Payer: Self-pay

## 2020-09-18 DIAGNOSIS — Z20822 Contact with and (suspected) exposure to covid-19: Secondary | ICD-10-CM | POA: Diagnosis not present

## 2020-09-19 ENCOUNTER — Other Ambulatory Visit: Payer: Self-pay | Admitting: Internal Medicine

## 2020-09-19 DIAGNOSIS — R918 Other nonspecific abnormal finding of lung field: Secondary | ICD-10-CM

## 2020-09-27 DIAGNOSIS — Z09 Encounter for follow-up examination after completed treatment for conditions other than malignant neoplasm: Secondary | ICD-10-CM | POA: Diagnosis not present

## 2020-09-27 DIAGNOSIS — N390 Urinary tract infection, site not specified: Secondary | ICD-10-CM | POA: Diagnosis not present

## 2020-10-03 ENCOUNTER — Other Ambulatory Visit (HOSPITAL_COMMUNITY): Payer: Self-pay

## 2020-10-03 MED ORDER — LEVOTHYROXINE SODIUM 50 MCG PO TABS
50.0000 ug | ORAL_TABLET | Freq: Every day | ORAL | 2 refills | Status: DC
  Filled 2020-10-03: qty 90, 90d supply, fill #0
  Filled 2021-01-03: qty 90, 90d supply, fill #1

## 2020-10-03 MED FILL — Alprazolam Tab 0.25 MG: ORAL | 30 days supply | Qty: 30 | Fill #0 | Status: AC

## 2020-10-03 MED FILL — Pravastatin Sodium Tab 40 MG: ORAL | 90 days supply | Qty: 45 | Fill #1 | Status: AC

## 2020-10-06 ENCOUNTER — Other Ambulatory Visit: Payer: Medicare Other

## 2020-10-17 ENCOUNTER — Other Ambulatory Visit: Payer: Medicare Other

## 2020-10-26 ENCOUNTER — Other Ambulatory Visit: Payer: Self-pay | Admitting: Cardiovascular Disease

## 2020-10-26 ENCOUNTER — Other Ambulatory Visit (HOSPITAL_COMMUNITY): Payer: Self-pay

## 2020-10-26 MED ORDER — CANDESARTAN CILEXETIL 16 MG PO TABS
16.0000 mg | ORAL_TABLET | Freq: Every day | ORAL | 2 refills | Status: DC
  Filled 2020-10-26: qty 90, 90d supply, fill #0
  Filled 2021-01-21: qty 90, 90d supply, fill #1

## 2020-10-26 MED FILL — Sumatriptan Succinate Tab 100 MG: ORAL | 23 days supply | Qty: 9 | Fill #0 | Status: AC

## 2020-10-26 MED FILL — Butalbital-Acetaminophen-Caffeine Tab 50-325-40 MG: ORAL | 10 days supply | Qty: 60 | Fill #1 | Status: AC

## 2020-10-27 ENCOUNTER — Other Ambulatory Visit (HOSPITAL_COMMUNITY): Payer: Self-pay

## 2020-10-27 ENCOUNTER — Telehealth: Payer: Self-pay | Admitting: Internal Medicine

## 2020-10-27 NOTE — Telephone Encounter (Signed)
Call made to patient, confirmed DOB. Patient developed symptoms on the 8/11. She tested positive for 8/16. She still has a productive cough with thick phlegm. All other symptoms have subsided. She is wanting to know how far she needs to push out her CT appt due to having covid. Her appt is scheduled for 8/25.   MW please advise Thanks :)

## 2020-10-27 NOTE — Telephone Encounter (Signed)
Call made to patient, confirmed DOB. Made aware CT will need to be pushed out at least 2 weeks. She reports they will be going out of town and it may have to be pushed out further.I made her aware that is fine per MR no rush.   [facility] can we reschedule her CT for at least 2 weeks out please. Thanks :) Scheduled for 8/25.

## 2020-10-27 NOTE — Telephone Encounter (Signed)
There is no rush at all, I would push it out at least 2  weeks to be sure she's all better from the infection which might give a false positive

## 2020-10-27 NOTE — Telephone Encounter (Signed)
Spoke to pt and gave her # to [facility] and she will call them herself to reschedule so she can do this with her schedule Jenny Bradford

## 2020-11-02 ENCOUNTER — Other Ambulatory Visit: Payer: Medicare Other

## 2020-11-05 ENCOUNTER — Other Ambulatory Visit (HOSPITAL_COMMUNITY): Payer: Self-pay

## 2020-11-06 ENCOUNTER — Other Ambulatory Visit (HOSPITAL_COMMUNITY): Payer: Self-pay

## 2020-11-06 MED ORDER — ALPRAZOLAM 0.25 MG PO TABS
ORAL_TABLET | ORAL | 2 refills | Status: DC
  Filled 2020-11-06: qty 30, 30d supply, fill #0
  Filled 2020-12-06: qty 30, 30d supply, fill #1
  Filled 2021-01-03: qty 30, 30d supply, fill #2

## 2020-11-21 ENCOUNTER — Other Ambulatory Visit (HOSPITAL_COMMUNITY): Payer: Self-pay

## 2020-11-21 MED FILL — Escitalopram Oxalate Tab 20 MG (Base Equiv): ORAL | 90 days supply | Qty: 90 | Fill #1 | Status: AC

## 2020-12-05 ENCOUNTER — Ambulatory Visit
Admission: RE | Admit: 2020-12-05 | Discharge: 2020-12-05 | Disposition: A | Discharge status: Home / Self Care | Payer: Medicare Other | Source: Ambulatory Visit | Attending: Internal Medicine | Admitting: Internal Medicine

## 2020-12-05 ENCOUNTER — Other Ambulatory Visit: Payer: Self-pay

## 2020-12-05 DIAGNOSIS — R918 Other nonspecific abnormal finding of lung field: Secondary | ICD-10-CM | POA: Diagnosis not present

## 2020-12-05 DIAGNOSIS — R911 Solitary pulmonary nodule: Secondary | ICD-10-CM | POA: Diagnosis not present

## 2020-12-06 ENCOUNTER — Other Ambulatory Visit (HOSPITAL_COMMUNITY): Payer: Self-pay

## 2020-12-06 ENCOUNTER — Other Ambulatory Visit: Payer: Self-pay | Admitting: Cardiovascular Disease

## 2020-12-06 MED ORDER — EZETIMIBE 10 MG PO TABS
10.0000 mg | ORAL_TABLET | Freq: Every day | ORAL | 1 refills | Status: DC
  Filled 2020-12-06: qty 90, 90d supply, fill #0
  Filled 2021-02-28: qty 90, 90d supply, fill #1

## 2021-01-02 ENCOUNTER — Ambulatory Visit: Payer: Self-pay

## 2021-01-02 ENCOUNTER — Other Ambulatory Visit: Payer: Self-pay

## 2021-01-02 ENCOUNTER — Ambulatory Visit (INDEPENDENT_AMBULATORY_CARE_PROVIDER_SITE_OTHER): Payer: Medicare Other | Admitting: Orthopaedic Surgery

## 2021-01-02 ENCOUNTER — Encounter: Payer: Self-pay | Admitting: Orthopaedic Surgery

## 2021-01-02 ENCOUNTER — Ambulatory Visit (HOSPITAL_COMMUNITY)
Admission: RE | Admit: 2021-01-02 | Discharge: 2021-01-02 | Disposition: A | Discharge status: Home / Self Care | Payer: Medicare Other | Source: Ambulatory Visit | Attending: Orthopaedic Surgery | Admitting: Orthopaedic Surgery

## 2021-01-02 DIAGNOSIS — M1711 Unilateral primary osteoarthritis, right knee: Secondary | ICD-10-CM | POA: Insufficient documentation

## 2021-01-02 DIAGNOSIS — M25552 Pain in left hip: Secondary | ICD-10-CM

## 2021-01-02 DIAGNOSIS — M25551 Pain in right hip: Secondary | ICD-10-CM | POA: Insufficient documentation

## 2021-01-02 NOTE — Progress Notes (Signed)
Right lower extremity venous duplex has been completed. Preliminary results can be found in CV Proc through chart review.  Results were given to Dr. Erlinda Hong.  01/02/21 12:47 PM Jenny Bradford RVT

## 2021-01-02 NOTE — Progress Notes (Signed)
Office Visit Note   Patient: Jenny Bradford           Date of Birth: 1950-09-19           MRN: [id number] Visit Date: 01/02/2021              Requested by: [name], [place] [place],  [place] [address] PCP: [name], FNP   Assessment & Plan: Visit Diagnoses:  1. Primary osteoarthritis of right knee   2. Bilateral hip pain     Plan: For the feet I recommend follow-up with PCP or cardiologist.  We will order a Doppler to rule out DVT.  For the knee her arthritis is progressing and she will continue to manage this conservatively until she is ready for knee replacement.  For the hips believe that she has abductor tendinosis/trochanteric bursitis.  I recommend outpatient physical therapy.  The pain seems to be relatively manageable so we will hold off on injection for now.  We will notify her of the results of the Doppler.  Follow-Up Instructions: No follow-ups on file.   Orders:  Orders Placed This Encounter  Procedures   XR HIPS BILAT W OR W/O PELVIS 3-4 VIEWS   XR KNEE 3 VIEW RIGHT   No orders of the defined types were placed in this encounter.     Procedures: No procedures performed   Clinical Data: No additional findings.   Subjective: Chief Complaint  Patient presents with   Right Hip - Pain, Open Wound   Left Hip - Pain   Right Knee - Pain   Left Foot - Pain   Right Foot - Pain    HPI  [name] comes in today for evaluation of bilateral hip pain right knee pain bilateral foot swelling.  For the feet she has no swelling after she went to the beach in September.  She had a sunburn during this time.  She states that the swelling has not resolved.  Denies any calf pain or shortness of breath.  Pain is limited to the top of her right foot.  In regards to the right knee the pain is gotten worse.  Unloader brace helps improve the pain but she feels like she needs to wear it all the time.  She has had prior cortisone  and Visco injections without relief.  In regards to bilateral hips she has lateral pain.  Denies any groin pain or back pain or radicular symptoms.  Hurts to sleep on the right side at night only in the bed not in the recliner.  Review of Systems   Objective: Vital Signs: There were no vitals taken for this visit.  Physical Exam  Ortho Exam  Right lower extremity shows mild swelling and pitting edema.  No neurovascular compromise.  No significant calf tenderness.  No cellulitis or skin changes.  Right knee exam is unchanged.  No joint effusion.  Crepitus with range of motion.  Bilateral hip exams show tenderness to the trochanteric region.  Normal range of motion without groin pain.  No sciatic tension signs.  Specialty Comments:  No specialty comments available.  Imaging: XR HIPS BILAT W OR W/O PELVIS 3-4 VIEWS  Result Date: 01/02/2021 Mild bilateral hip osteoarthritis.  No acute abnormalities.  XR KNEE 3 VIEW RIGHT  Result Date: 01/02/2021 Moderately severe medial compartment joint space narrowing with spurring.    PMFS History: Patient Active Problem List   Diagnosis Date Noted   Bilateral  hip pain 01/02/2021   Primary osteoarthritis of left knee 01/26/2019   Primary osteoarthritis of right knee 01/26/2019   Palpitations 09/04/2017   Family history of coronary artery disease in father 09/04/2017   Paraesophageal hernia 05/13/2017   Neck pain 01/03/2017   Large hiatal hernia 12/28/2016   Multiple pulmonary nodules determined by computed tomography of lung 12/27/2016   Arthritis of carpometacarpal (CMC) joint of left thumb 05/28/2016   OSA (obstructive sleep apnea) 10/05/2012   HTN (hypertension) 10/05/2012   Hyperlipidemia 10/05/2012   Migraine headache 10/05/2012   Past Medical History:  Diagnosis Date   AAA (abdominal aortic aneurysm) 08/2014   scanning every 2 years   Arthritis    oa   Complication of anesthesia    slow to awaken in past   Family  history of anesthesia complication    slow to awaken   GERD (gastroesophageal reflux disease)    Grade II diastolic dysfunction 91/4782   Noted on ECHO   H/O hiatal hernia    Headache(784.0)    migraines (rare)   Hemorrhoids    Hyperlipidemia    Hypertension 04/01/11   ECHO-EF>55% NUC STRESS TEST- 05/01/12   Hypothyroidism    Mild aortic valve regurgitation 04/2017   Noted on ECHO   Mitral valve regurgitation 04/2017   Mild: Noted on ECHO   Pulmonary nodules 2018   scanning every 6 months   Sleep apnea 05/01/12 & 05/29/12   SLEEP STUDY-[place], NO CPAP USED SINCE MAY 2015 Uses oral device    Family History  Problem Relation Age of Onset   Breast cancer Mother    Ovarian cancer Mother    Rheum arthritis Mother    Heart attack Father    Heart disease Father    Hypertension Brother    Heart disease Brother    Hyperlipidemia Maternal Grandmother    Hypertension Maternal Grandmother    Polymyalgia rheumatica Paternal Aunt    Hypertension Son    Migraines Daughter    Raynaud syndrome Daughter     Past Surgical History:  Procedure Laterality Date   ANKLE SURGERY Right 1980   CARPOMETACARPEL SUSPENSION PLASTY Right 12/14/2014   Procedure: RIGHT THUMB CARPOMETACARPAL ([place]) ARTHROPLASTY;  Surgeon: Leandrew Koyanagi, MD;  Location: [facility];  Service: Orthopedics;  Laterality: Right;   CARPOMETACARPEL SUSPENSION PLASTY Left 03/19/2017   Procedure: Left Thumb Ligament Reconstruction and Tendon Interposition;  Surgeon: Leandrew Koyanagi, MD;  Location: [facility];  Service: Orthopedics;  Laterality: Left;   COLONOSCOPY WITH PROPOFOL N/A 12/07/2013   Procedure: COLONOSCOPY WITH PROPOFOL;  Surgeon: Garlan Fair, MD;  [facility];  Service: Endoscopy;  Laterality: N/A;   CYSTECTOMY  1975   FOOT SURGERY Right 12/01/2012   FOOT SURGERY Left 12/10/2011   HEMORRHOID SURGERY  2005   HERNIA REPAIR  2019   Miscarriage  [place]   VAGINAL HYSTERECTOMY     With pelvic floor repair   Social History   Occupational History   Not on file  Tobacco Use   Smoking status: Never   Smokeless tobacco: Never  Vaping Use   Vaping Use: Never used  Substance and Sexual Activity   Alcohol use: No    Alcohol/week: 0.0 standard drinks   Drug use: No   Sexual activity: Not on file

## 2021-01-03 ENCOUNTER — Other Ambulatory Visit (HOSPITAL_COMMUNITY): Payer: Self-pay

## 2021-01-04 DIAGNOSIS — E039 Hypothyroidism, unspecified: Secondary | ICD-10-CM | POA: Diagnosis not present

## 2021-01-04 DIAGNOSIS — N182 Chronic kidney disease, stage 2 (mild): Secondary | ICD-10-CM | POA: Diagnosis not present

## 2021-01-04 DIAGNOSIS — E785 Hyperlipidemia, unspecified: Secondary | ICD-10-CM | POA: Diagnosis not present

## 2021-01-04 DIAGNOSIS — Z0000 Encounter for general adult medical examination without abnormal findings: Secondary | ICD-10-CM | POA: Diagnosis not present

## 2021-01-16 DIAGNOSIS — E559 Vitamin D deficiency, unspecified: Secondary | ICD-10-CM | POA: Diagnosis not present

## 2021-01-16 DIAGNOSIS — I10 Essential (primary) hypertension: Secondary | ICD-10-CM | POA: Diagnosis not present

## 2021-01-16 DIAGNOSIS — I744 Embolism and thrombosis of arteries of extremities, unspecified: Secondary | ICD-10-CM | POA: Diagnosis not present

## 2021-01-16 DIAGNOSIS — Z23 Encounter for immunization: Secondary | ICD-10-CM | POA: Diagnosis not present

## 2021-01-16 DIAGNOSIS — Z0000 Encounter for general adult medical examination without abnormal findings: Secondary | ICD-10-CM | POA: Diagnosis not present

## 2021-01-16 DIAGNOSIS — M79671 Pain in right foot: Secondary | ICD-10-CM | POA: Diagnosis not present

## 2021-01-16 DIAGNOSIS — M8589 Other specified disorders of bone density and structure, multiple sites: Secondary | ICD-10-CM | POA: Diagnosis not present

## 2021-01-16 DIAGNOSIS — E039 Hypothyroidism, unspecified: Secondary | ICD-10-CM | POA: Diagnosis not present

## 2021-01-16 DIAGNOSIS — E785 Hyperlipidemia, unspecified: Secondary | ICD-10-CM | POA: Diagnosis not present

## 2021-01-18 DIAGNOSIS — Z20822 Contact with and (suspected) exposure to covid-19: Secondary | ICD-10-CM | POA: Diagnosis not present

## 2021-01-21 ENCOUNTER — Other Ambulatory Visit: Payer: Self-pay | Admitting: Cardiovascular Disease

## 2021-01-21 MED FILL — Hydrocortisone Perianal Cream 2.5%: CUTANEOUS | 24 days supply | Qty: 60 | Fill #0 | Status: AC

## 2021-01-22 ENCOUNTER — Other Ambulatory Visit (HOSPITAL_COMMUNITY): Payer: Self-pay

## 2021-01-23 ENCOUNTER — Other Ambulatory Visit (HOSPITAL_COMMUNITY): Payer: Self-pay

## 2021-01-23 MED ORDER — PRAVASTATIN SODIUM 40 MG PO TABS
ORAL_TABLET | Freq: Every evening | ORAL | 0 refills | Status: DC
Start: 2021-01-23 — End: 2021-04-18
  Filled 2021-01-23: qty 45, 90d supply, fill #0

## 2021-01-24 ENCOUNTER — Other Ambulatory Visit (HOSPITAL_COMMUNITY): Payer: Self-pay

## 2021-01-25 ENCOUNTER — Encounter: Payer: Self-pay | Admitting: Cardiovascular Disease

## 2021-01-25 ENCOUNTER — Other Ambulatory Visit (HOSPITAL_COMMUNITY): Payer: Self-pay

## 2021-01-25 ENCOUNTER — Other Ambulatory Visit: Payer: Self-pay

## 2021-01-25 ENCOUNTER — Ambulatory Visit (INDEPENDENT_AMBULATORY_CARE_PROVIDER_SITE_OTHER): Payer: Medicare Other | Admitting: Cardiovascular Disease

## 2021-01-25 VITALS — BP 122/68 | HR 60 | Ht 62.0 in | Wt 164.6 lb

## 2021-01-25 DIAGNOSIS — E039 Hypothyroidism, unspecified: Secondary | ICD-10-CM

## 2021-01-25 DIAGNOSIS — I5189 Other ill-defined heart diseases: Secondary | ICD-10-CM

## 2021-01-25 DIAGNOSIS — R0600 Dyspnea, unspecified: Secondary | ICD-10-CM | POA: Diagnosis not present

## 2021-01-25 DIAGNOSIS — K449 Diaphragmatic hernia without obstruction or gangrene: Secondary | ICD-10-CM | POA: Diagnosis not present

## 2021-01-25 DIAGNOSIS — I10 Essential (primary) hypertension: Secondary | ICD-10-CM

## 2021-01-25 DIAGNOSIS — U071 COVID-19: Secondary | ICD-10-CM | POA: Diagnosis not present

## 2021-01-25 MED ORDER — SPIRONOLACTONE 25 MG PO TABS
ORAL_TABLET | ORAL | 3 refills | Status: DC
  Filled 2021-01-25: qty 90, 60d supply, fill #0

## 2021-01-25 NOTE — Patient Instructions (Addendum)
Medication Instructions:  Take spironolactone 25 mg (one tablet) each day. If you have any swelling in the afternoon, take 12.5 mg (1/2) tablet as needed.  *If you need a refill on your cardiac medications before your next appointment, please call your pharmacy*   Lab Work: None    Testing/Procedures: Your physician has requested that you have an echocardiogram. Echocardiography is a painless test that uses sound waves to create images of your heart. It provides your doctor with information about the size and shape of your heart and how well your heart's chambers and valves are working. This procedure takes approximately one hour. There are no restrictions for this procedure.    Follow-Up: At [facility], you and your health needs are our priority.  As part of our continuing mission to provide you with exceptional heart care, we have created designated Provider Care Teams.  These Care Teams include your primary Cardiologist (physician) and Advanced Practice Providers (APPs -  Physician Assistants and Nurse Practitioners) who all work together to provide you with the care you need, when you need it.  We recommend signing up for the patient portal called "MyChart".  Sign up information is provided on this After Visit Summary.  MyChart is used to connect with patients for Virtual Visits (Telemedicine).  Patients are able to view lab/test results, encounter notes, upcoming appointments, etc.  Non-urgent messages can be sent to your provider as well.   To learn more about what you can do with MyChart, go to [email or web address].    Your next appointment:    04/17/2021 @ 9:20 am  The format for your next appointment:   In Person  Provider:   Dr. Corky Downs, MD

## 2021-01-25 NOTE — Progress Notes (Signed)
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  I had cared for her father Jenny Bradford for many years who ultimately died on 02/09/14 at [facility].  She established cardiology care with me in February 2019.  I last saw her in February 2022. She presents for an 9 month follow-up evaluation.   Jenny Bradford has a history of hypertension for many years.  Most recently, she has been on losartan 75 mg daily in addition to HCTZ 25 mg.  She also has a history of hypothyroidism and has been on levothyroxine at 50 g.  There is a history of hyperlipidemia and she has been on pravastatin 40 mg.  Her father had a history of significant abdominal aortic aneurysm.  She underwent abdominal aortic aneurysm screening and there was no significant aortic dilatation and instructed with a maximum aortic dimension at 2.3 cm.  She has a history of obstructive sleep apnea which was diagnosed in 2014 with an AHI of 14 per hour and RDI of 16.5 per hour.  She had significant oxygen desaturation to a nadir of 81% with rems sleep and had loud snoring.  Apparently should use CPAP for a short while but ultimately became intolerant was ultimately transitioned to an oral appliance for which she is followed by [name].   When I saw her in February 2019 she had begun to notice that despite taking medication for her blood pressure, her blood pressure elevated.  She was walking 4 days per week for 30 minutes at a time.  She denied any exertionally precipitation of chest pain.  She was unaware of any significant valvular abnormality, although her mother has aortic stenosis.  She also has a very large hiatal hernia and has seen Dr. [name] and was told that in the future she may  require surgery.  She underwent surgery for paraesophageal hernia repair with a Nissen fundoplication in March 8466.  She also has had progressive difficulty with osteoarthritis involving her neck, back, hands, feet, and feet.  He has not been able to exercise as regularly.  She is no longer working since she is unable to stand up the entire day.  She now sees [name] for primary care.  She has been caring for her husband who is permanently disabled since age 80.    When I  saw her on December 08, 2017 as result of the generic impurities I had recommended she change from losartan 100 mg daily to olmesartan 40 mg.  Her lipid studies were not optimal on pravastatin and I suggested discontinuance of this and in its place started atorvastatin 40 mg.  An echo Doppler study in February 2019 showed an EF of 60 to 65% with grade 2 diastolic dysfunction, mild AR and mild MR.  An event monitor had shown sinus rhythm with low average heart rate at 50 and high average heart rate at 70 bpm.  She had several atrial couplets and one atrial triplet.  There were no episodes of atrial fibrillation.  There was no ventricular ectopy or pauses.  I saw her in March 2020 after she had called the office and wished to be seen sooner than her scheduled appointment  due to progressive fatigue and particularly shortness of breath particularly while climbing steps.  She had been self adjusting some of her medications and was no longer on olmesartan but has been on valsartan 160 mg.  However she felt better when she cuts this in half and had been taking 80 mg twice a day.  Denied any chest pain and was walking 1-1 and half miles per day.  She was only using CPAP intermittently and had adamantly using a customized oral appliance.   She was evaluated in a telemedicine visit in June 2020.  At that time she was  on candesartan for ARB therapy.  Her dose was further titrated to 60 mg in the morning and 8 mg in the evening.  She was on  atorvastatin 40 mg on Monday Wednesday and Friday for hyperlipidemia.  She was using an oral appliance for obstructive sleep apnea from [name].  An echo Doppler study from August 13, 2018 showed normal systolic function with EF at 55 to 60%.  There was mild aortic sclerosis with mild AR.  The aortic root and ascending aorta and aortic arch were normal in dimensions.  I last saw her on December 07, 2018 which time she was complaining of some muscle aching.  As result she stopped taking atorvastatin several weeks prior to that evaluation.  Previously she felt she tolerated pravastatin better.  She had noticed fatigue and at times low blood pressures.  She will be undergoing a hysterectomy in late January.  I reviewed laboratory from November 23, 2018.  BUN 13 creatinine 0.94.  LFTs normal.  Stable CBC.  Compared to March 2020, cholesterol increased from 140 to162.  LDL increased from 57 while on atorvastatin daily to 84 on her reduced dose.  During that evaluation, recommended institution of Zetia 10 mg.  2 weeks thereafter I recommended resumption of pravastatin which she had tolerated better than atorvastatin.  I also checked a CPK and erythrocyte sedimentation rate which were normal arguing against myopathy or rhabdomyolysis.  She was using her oral appliance for obstructive sleep apnea.  I saw her in December 2020 and over the several months prior to that evaluation she denied any episodes of chest tightness or pressure.  She was contemplating whether or not to go through her elective neurectomy and pelvic floor reconstruction January with the spike in COVID-19 this is to be done by a urogynecologist at [facility].  She admits to fatigue.  She has not had recent laboratory on Zetia and and pravastatin which she has been taking 20 mg.  She continued to be on candesartan 16 mg, spironolactone 12.5 mg for hypertension and  levothyroxine 50 mcg for hypothyroidism.  During that evaluation she continued  to have some fatigue but admitted that she was not consistently using her oral appliance.  I saw her in June 2021 and since her prior evaluation she was found to have positive ANA and was seen by Dr. Patrecia Pour.  She was started on aspirin therapy and subsequent laboratory showed improvement of inflammatory markers.  Subsequently she felt better.  She was never started on immunosuppressive therapy.  She has been wearing a knee brace and is followed by Dr. Erlinda Hong of orthopedics.  Most recently she did not tolerate a trial of atorvastatin for more aggressive lipid-lowering and resume taking pravastatin 20 mg in addition to Zetia for hyperlipidemia.  Her grandchild was born 28 weeks ago and she is now scheduled for her elective hysterectomy to be done  in August 2021.  She was last seen by me on April 17, 2020 and since her prior evaluation she continued to do well.   She underwent her urogynecologic surgery October 28, 2019 and tolerated this well.  She has been able to tolerate Zetia and Pravachol statin and lipid studies in October 2021 were markedly improved with a total cholesterol of 130, LDL of 51, triglycerides at 98 and HDL 59.  She did have some issues with reading where she would sense a gap and miss words.  She was evaluated by her ophthalmologist who felt her vision is stable.  She will be undergoing reassessment of ANA at follow-up with Dr. Patrecia Pour.  Since I last saw her, she developed COVID in August 2022 and had fever fatigue sore throat and cough for approximately 1 month.  Subsequently, she has noticed some right leg swelling.  She admits to shortness of breath which seems to occur with decreased activity from previously.  She admits that she has not been exercising regularly.  She also experiences pain in her right foot and has been diagnosed with increased arthritis and bone spurs.  She has been taking spironolactone 25 mg daily, she is on chronic cephalexin for prevention of recurrent UTIs.  She  continues to be on Zetia and pravastatin 40 mg for hyperlipidemia.  She is on candesartan 16 mg for hypertension.  She presents for evaluation.  Past Medical History:  Diagnosis Date   AAA (abdominal aortic aneurysm) 08/2014   scanning every 2 years   Arthritis    oa   Complication of anesthesia    slow to awaken in past   Family history of anesthesia complication    slow to awaken   GERD (gastroesophageal reflux disease)    Grade II diastolic dysfunction 61/9509   Noted on ECHO   H/O hiatal hernia    Headache(784.0)    migraines (rare)   Hemorrhoids    Hyperlipidemia    Hypertension 04/01/11   ECHO-EF>55% NUC STRESS TEST- 05/01/12   Hypothyroidism    Mild aortic valve regurgitation 04/2017   Noted on ECHO   Mitral valve regurgitation 04/2017   Mild: Noted on ECHO   Pulmonary nodules 2018   scanning every 6 months   Sleep apnea 05/01/12 & 05/29/12   SLEEP STUDY-[facility], NO CPAP USED SINCE MAY 2015 Uses oral device    Past Surgical History:  Procedure Laterality Date   ANKLE SURGERY Right [place] Right 12/14/2014   Procedure: RIGHT THUMB CARPOMETACARPAL ([place]) ARTHROPLASTY;  Surgeon: Leandrew Koyanagi, MD;  Location: [facility];  Service: Orthopedics;  Laterality: Right;   CARPOMETACARPEL SUSPENSION PLASTY Left 03/19/2017   Procedure: Left Thumb Ligament Reconstruction and Tendon Interposition;  Surgeon: Leandrew Koyanagi, MD;  Location: [facility];  Service: Orthopedics;  Laterality: Left;   COLONOSCOPY WITH PROPOFOL N/A 12/07/2013   Procedure: COLONOSCOPY WITH PROPOFOL;  Surgeon: Garlan Fair, MD;  Location: [facility];  Service: Endoscopy;  Laterality: N/A;   CYSTECTOMY  1975   FOOT SURGERY Right 12/01/2012   FOOT SURGERY Left 12/10/2011   HEMORRHOID SURGERY  2005   HERNIA REPAIR  2019   Miscarriage  1983   TONSILLECTOMY  1964   VAGINAL HYSTERECTOMY     With pelvic floor repair    Current  Medications: Outpatient Medications Prior to Visit  Medication Sig Dispense Refill   acetaminophen (TYLENOL) 500 MG tablet Take 500 mg by mouth  2 (two) times daily.     ALPRAZolam (XANAX) 0.25 MG tablet TAKE 1 TABLET BY MOUTH AT BEDTIME AS NEEDED 30 tablet 2   butalbital-acetaminophen-caffeine (FIORICET) 50-325-40 MG tablet TAKE 1 TABLET BY MOUTH EVERY 4 HOURS AS DIRECTED 60 tablet 2   candesartan (ATACAND) 16 MG tablet Take 1 tablet (16 mg total) by mouth daily. 90 tablet 2   cephALEXin (KEFLEX) 250 MG capsule 1 capsule     Cholecalciferol (VITAMIN D) 2000 units tablet Take 2,000 Units by mouth daily.     D-Mannose 500 MG CAPS      Docusate Sodium 100 MG capsule Take 200 mg by mouth daily as needed for mild constipation. Takes 300 mg  as needed     escitalopram (LEXAPRO) 20 MG tablet Take 10-20 mg by mouth every morning. Hot flashes     estradiol (ESTRACE) 0.1 MG/GM vaginal cream Place 2 g vaginally 2 (two) times a week. USE TWICE WEEKLY     ezetimibe (ZETIA) 10 MG tablet Take 1 tablet (10 mg total) by mouth daily. 90 tablet 1   hydrocortisone (ANUSOL-HC) 2.5 % rectal cream APPLY TO THE AFFECTED RECTAL AREA 2 TIMES DAILY 60 g 5   hydrocortisone 2.5 % cream Proctosol HC 2.5 % rectal cream with applicator     levothyroxine (SYNTHROID, LEVOTHROID) 50 MCG tablet Take 50 mcg by mouth daily before breakfast.     montelukast (SINGULAIR) 10 MG tablet TAKE 1 TABLET BY MOUTH EVERY DAY FOR 30 DAYS     pravastatin (PRAVACHOL) 40 MG tablet TAKE 1/2 TABLET BY MOUTH EVERY EVENING 45 tablet 0   simethicone (MYLICON) 80 MG chewable tablet Chew 1 tablet (80 mg total) by mouth every 6 (six) hours as needed for flatulence (bloating). 30 tablet 0   SUMAtriptan (IMITREX) 100 MG tablet TAKE 1 TABLET BY MOUTH AS NEEDED ONE TIME 9 tablet 3   Wheat Dextrin (BENEFIBER PO) daily.     ALPRAZolam (XANAX) 0.25 MG tablet Take 0.25 mg by mouth at bedtime as needed for sleep.     ALPRAZolam (XANAX) 0.25 MG tablet Take 1  tablet by mouth at bedtime as needed 30 tablet 2   butalbital-acetaminophen-caffeine (FIORICET) 50-325-40 MG tablet TAKE 1 TABLET BY MOUTH EVERY 4 HOURS AS DIRECTED 60 tablet 2   butalbital-acetaminophen-caffeine (FIORICET, ESGIC) 50-325-40 MG per tablet Take 1 tablet by mouth 2 (two) times daily as needed for headache.     escitalopram (LEXAPRO) 20 MG tablet TAKE 1 TABLET BY MOUTH ONCE DAILY 90 tablet 3   levothyroxine (SYNTHROID) 50 MCG tablet TAKE 1 TABLET BY MOUTH ONCE A DAY 90 tablet 2   levothyroxine (SYNTHROID) 50 MCG tablet Take 1 tablet (50 mcg total) by mouth daily. 90 tablet 2   spironolactone (ALDACTONE) 25 MG tablet TAKE 1/2 TABLET BY MOUTH DAILY, MAY USE AS NEEDED FOR SWELLING AS WELL (Patient taking differently: Take 25 mg by mouth once. TAKE 1 TABLET BY MOUTH DAILY, MAY USE AS NEEDED FOR SWELLING AS WELL) 45 tablet 2   Ascorbic Acid (VITAMIN C) 500 MG CAPS      Melatonin 3 MG CAPS 1 tablet at bedtime as needed     PAC CRANBERRY PO      No facility-administered medications prior to visit.     Allergies:   Midol [aspirin-cinnamedrine-caffeine], Olmesartan, Talwin [pentazocine], Bisoprolol, and Naproxen   Social History   Socioeconomic History   Marital status: Married    Spouse name: Not on file   Number of children: Not  on file   Years of education: Not on file   Highest education level: Not on file  Occupational History   Not on file  Tobacco Use   Smoking status: Never   Smokeless tobacco: Never  Vaping Use   Vaping Use: Never used  Substance and Sexual Activity   Alcohol use: No    Alcohol/week: 0.0 standard drinks   Drug use: No   Sexual activity: Not on file  Other Topics Concern   Not on file  Social History Narrative   Not on file   Social Determinants of Health   Financial Resource Strain: Not on file  Food Insecurity: Not on file  Transportation Needs: Not on file  Physical Activity: Not on file  Stress: Not on file  Social Connections: Not on  file    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  No tobacco history.  Family History:  The patient's family history includes Breast cancer in her mother; Heart attack in her father; Heart disease in her brother and father; Hyperlipidemia in her maternal grandmother; Hypertension in her brother, maternal grandmother, and son; Migraines in her daughter; Ovarian cancer in her mother; Polymyalgia rheumatica in her paternal aunt; Raynaud syndrome in her daughter; Rheum arthritis in her mother.   ROS General: Negative; No fevers, chills, or night sweats; increasing fatigue HEENT: Transient vision change negative; No changes  or hearing, sinus congestion, difficulty swallowing Pulmonary: Negative; No cough, wheezing, shortness of breath, hemoptysis Cardiovascular: See history of present illness, no chest pain, PND, orthopnea. GI: Positive for large hiatal hernia; is post Nissan fundoplication surgery GU: s/p elective hysterectomy and pelvic floor reconstruction Musculoskeletal: Positive for right knee discomfort wearing a right knee brace Hematologic/Oncology: Negative; no easy bruising, bleeding Rheumatology: Positive ANA Endocrine: Negative; no heat/cold intolerance; no diabetes Neuro: Negative; no changes in balance, headaches Skin: Negative; No rashes or skin lesions Psychiatric: Negative; No behavioral problems, depression Sleep: Positive for OSA, now with a customized oral appliance with mandibular advancement; No snoring, daytime sleepiness, hypersomnolence, bruxism, restless legs, hypnogognic hallucinations, no cataplexy Other comprehensive 14 point system review is negative.   PHYSICAL EXAM:   VS:  BP 122/68   Pulse 60   Ht _0  (1.575 m)   Wt 164 lb 9.6 oz (74.7 kg)   SpO2 95%   BMI 30.11 kg/m     Repeat blood pressure by me was 116/70  Wt Readings from Last 3 Encounters:  01/25/21 164 lb 9.6 oz (74.7 kg)  08/30/20 161 lb 11.2 oz (73.3 kg)   08/22/20 161 lb 3.2 oz (73.1 kg)    General: Alert, oriented, no distress.  Skin: normal turgor, no rashes, warm and dry HEENT: Normocephalic, atraumatic. Pupils equal round and reactive to light; sclera anicteric; extraocular muscles intact, Nose without nasal septal hypertrophy Mouth/Parynx benign; Mallinpatti scale 3 Neck: No JVD, no carotid bruits; normal carotid upstroke Lungs: clear to ausculatation and percussion; no wheezing or rales Chest wall: without tenderness to palpitation Heart: PMI not displaced, RRR, s1 s2 normal, 1/6 systolic murmur, no diastolic murmur, no rubs, gallops, thrills, or heaves Abdomen: soft, nontender; no hepatosplenomehaly, BS+; abdominal aorta nontender and not dilated by palpation. Back: no CVA tenderness Pulses 2+ Musculoskeletal: full range of motion, normal strength, no joint deformities Extremities: no clubbing cyanosis or edema, Homan's sign negative  Neurologic: grossly nonfocal; Cranial nerves grossly wnl Psychologic: Normal mood and affect  Studies/Labs Reviewed:   ECG (independently read by  me):  NSR at 60, no ectopy  April 17, 2020 ECG (independently read by me): Sinus Bradycardia at 59; no ectopy, normal intervals  August 30, 2019 ECG (independently read by me): Sinus bradycardia 55 bpm.  No ectopy.  Normal intervals.  March 02, 2019 ECG (independently read by me): Normal sinus rhythm at 65 bpm.  No ectopy.  Normal intervals.  No ST segment changes.    September 2020 ECG (independently read by me): Normal sinus rhythm at 61 bpm.  Normal intervals.  No ectopy.  March 2020 ECG (independently read by me): Normal sinus rhythm at 69 bpm.  Poor anterior R wave progression V1 through V3.  December 08, 2017 ECG (independently read by me): NSR at 66; normal intervals  04/21/2017 ECG (independently read by me): Normal sinus rhythm at 66 bpm.  No ST segment changes.  Normal intervals.  No ectopy.  Very mild RV conduction delay.  Recent  Labs: BMP Latest Ref Rng & Units 06/18/2019 11/23/2018 06/04/2018  Glucose 65 - 99 mg/dL 71 80 88  BUN 7 - 25 mg/dL _0 Creatinine 0.50 - 0.99 mg/dL 0.89 0.94 0.89  BUN/Creat Ratio 6 - 22 (calc) NOT APPLICABLE 14 18  Sodium 135 - 146 mmol/L 136 137 141  Potassium 3.5 - 5.3 mmol/L 4.5 4.6 4.7  Chloride 98 - 110 mmol/L 103 103 103  CO2 20 - 32 mmol/L _1 Calcium 8.6 - 10.4 mg/dL 9.3 9.4 9.3     Hepatic Function Latest Ref Rng & Units 06/18/2019 11/23/2018 06/04/2018  Total Protein 6.1 - 8.1 g/dL 6.5 6.6 6.6  Albumin 3.8 - 4.8 g/dL - 4.0 4.1  AST 10 - 35 U/L _2 ALT 6 - 29 U/L 21 23 35(H)  Alk Phosphatase 39 - 117 IU/L - 71 94  Total Bilirubin 0.2 - 1.2 mg/dL 0.7 0.5 0.5    CBC Latest Ref Rng & Units 06/18/2019 11/23/2018 05/14/2017  WBC 3.8 - 10.8 Thousand/uL 5.8 5.0 16.4(H)  Hemoglobin 11.7 - 15.5 g/dL 13.6 13.7 11.5(L)  Hematocrit 35.0 - 45.0 % 41.0 40.1 34.7(L)  Platelets 140 - 400 Thousand/uL 248 273 215   Lab Results  Component Value Date   MCV 97.9 06/18/2019   MCV 97 11/23/2018   MCV 96.7 05/14/2017   Lab Results  Component Value Date   TSH 1.830 11/23/2018   No results found for: HGBA1C   BNP    Component Value Date/Time   BNP 30.6 11/23/2018 0918    ProBNP No results found for: PROBNP   Lipid Panel     Component Value Date/Time   CHOL 147 09/09/2019 0951   TRIG 135 09/09/2019 0951   HDL 57 09/09/2019 0951   CHOLHDL 2.6 09/09/2019 0951   LDLCALC 67 09/09/2019 0951     RADIOLOGY: XR HIPS BILAT W OR W/O PELVIS 3-4 VIEWS  Result Date: 01/02/2021 Mild bilateral hip osteoarthritis.  No acute abnormalities.  XR KNEE 3 VIEW RIGHT  Result Date: 01/02/2021 Moderately severe medial compartment joint space narrowing with spurring.  VAS [place] LOWER EXTREMITY VENOUS (DVT)  Result Date: 01/02/2021  Lower Venous DVT Study Patient Name:  TALLY MATTOX  Date of Exam:   01/02/2021 Medical Rec #: [id number]          Accession #:    [id number] Date  of Birth: 10-Mar-1951         Patient Gender: F Patient Age:   55 years Exam Location:  [facility] Procedure:      VAS [place] LOWER EXTREMITY VENOUS (DVT) Referring Phys: NAIPING XU --------------------------------------------------------------------------------  Indications: Pain, and Swelling.  Risk Factors: None identified. Comparison Study: No prior studies. Performing Technologist: Oliver Hum RVT  Examination Guidelines: A complete evaluation includes B-mode imaging, spectral Doppler, color Doppler, and power Doppler as needed of all accessible portions of each vessel. Bilateral testing is considered an integral part of a complete examination. Limited examinations for reoccurring indications may be performed as noted. The reflux portion of the exam is performed with the patient in reverse Trendelenburg.  +---------+---------------+---------+-----------+----------+--------------+ RIGHT    CompressibilityPhasicitySpontaneityPropertiesThrombus Aging +---------+---------------+---------+-----------+----------+--------------+ CFV      Full           Yes      Yes                                 +---------+---------------+---------+-----------+----------+--------------+ SFJ      Full                                                        +---------+---------------+---------+-----------+----------+--------------+ FV Prox  Full                                                        +---------+---------------+---------+-----------+----------+--------------+ FV Mid   Full                                                        +---------+---------------+---------+-----------+----------+--------------+ FV DistalFull                                                        +---------+---------------+---------+-----------+----------+--------------+ PFV      Full                                                         +---------+---------------+---------+-----------+----------+--------------+ POP      Full           Yes      Yes                                 +---------+---------------+---------+-----------+----------+--------------+ PTV      Full                                                        +---------+---------------+---------+-----------+----------+--------------+ PERO     Full                                                        +---------+---------------+---------+-----------+----------+--------------+   +----+---------------+---------+-----------+----------+--------------+  LEFTCompressibilityPhasicitySpontaneityPropertiesThrombus Aging +----+---------------+---------+-----------+----------+--------------+ CFV Full           Yes      Yes                                 +----+---------------+---------+-----------+----------+--------------+     Summary: RIGHT: - There is no evidence of deep vein thrombosis in the lower extremity.  - No cystic structure found in the popliteal fossa.  LEFT: - No evidence of common femoral vein obstruction.  *See table(s) above for measurements and observations. Electronically signed by Harold Barban MD on 01/02/2021 at 9:16:52 PM.    Final      Additional studies/ records that were reviewed today include:  I reviewed the patient's prior evaluation at the [facility] in 2014.  following a sleep study.  I reviewed her imaging studies, and prior evaluation by Dr. Melvyn Novas.  I reviewed her rheumatology evaluation from April 13, 2019   ASSESSMENT:    1. Dyspnea, unspecified type   2. Essential hypertension   3. Covid : August 2022   4. Grade II diastolic dysfunction   5. Hypothyroidism, unspecified type   6. 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  Remotely she had been on losartan for hypertension and pravastatin for hyperlipidemia.  At prior office visits her  blood pressure has been well controlled on her regimen consisting of candesartan 16 mg and spironolactone 12.5 mg daily.  Remotely she had issues with myalgias on atorvastatin.  She has been able to tolerate Zetia 10 mg and pravastatin 20 mg. Lipid studies have been excellent with an LDL cholesterol at 51 in October 2021.  A previous echo has demonstrated normal systolic function with EF of 60 to 65% with grade 2 diastolic dysfunction, mild AR and mild MR. She developed COVID infection in August 2022 and did not feel well for approximately a month.  Subsequently, she has noticed more shortness of breath with decreased activity.  She admits that she has not been as physically active and there may be a component of deconditioning.  However, following her COVID infection I am recommending a follow-up echo Doppler study for reassessment of systolic as well as diastolic dysfunction.  Previously she had been demonstrated to have grade 2 diastolic dysfunction.  She also has had some issues with pain in her foot due to increased arthritic and bone spurs.  At times she does note right leg swelling.  She has increased her spironolactone from 12.5 mg to 25 mg daily.  I have suggested that if there are days where there is increased swelling she can take an extra 12.5 mg of spironolactone in the late afternoon.  She will continue with her current dose of candesartan 60 mg daily.  She continues to tolerate Zetia 10 mg and pravastatin 40 mg.  I reviewed recent laboratory that she had done with her primary care.  Hemoglobin hematocrit were stable.  LFTs were normal.  Lipid studies were stable with total cholesterol 130 triglycerides 98 HDL 59 LDL 51 on therapy.  TSH was normal at 1.74.  She continues to be on levothyroxine 50 mcg for hypothyroidism.  She has a previously scheduled appointment to see  me on April 17, 2021.  Medication Adjustments/Labs and Tests Ordered: Current medicines are reviewed at length with the patient today.  Concerns regarding medicines are outlined above.  Medication changes, Labs and Tests ordered today are listed in the Patient Instructions below. Patient Instructions  Medication Instructions:  Take spironolactone 25 mg (one tablet) each day. If you have any swelling in the afternoon, take 12.5 mg (1/2) tablet as needed.  *If you need a refill on your cardiac medications before your next appointment, please call your pharmacy*   Lab Work: None    Testing/Procedures: Your physician has requested that you have an echocardiogram. Echocardiography is a painless test that uses sound waves to create images of your heart. It provides your doctor with information about the size and shape of your heart and how well your heart's chambers and valves are working. This procedure takes approximately one hour. There are no restrictions for this procedure.    Follow-Up: At [facility], you and your health needs are our priority.  As part of our continuing mission to provide you with exceptional heart care, we have created designated Provider Care Teams.  These Care Teams include your primary Cardiologist (physician) and Advanced Practice Providers (APPs -  Physician Assistants and Nurse Practitioners) who all work together to provide you with the care you need, when you need it.  We recommend signing up for the patient portal called "MyChart".  Sign up information is provided on this After Visit Summary.  MyChart is used to connect with patients for Virtual Visits (Telemedicine).  Patients are able to view lab/test results, encounter notes, upcoming appointments, etc.  Non-urgent messages can be sent to your provider as well.   To learn more about what you can do with MyChart, go to [email or web address].    Your next appointment:    04/17/2021 @ 9:20 am  The  format for your next appointment:   In Person  Provider:   Dr. Corky Downs, MD       Signed, [name], MD  01/27/2021 5:16 PM    [facility] [address], [place], [place], [place]  [address] Phone: [phone]

## 2021-01-27 ENCOUNTER — Encounter: Payer: Self-pay | Admitting: Cardiovascular Disease

## 2021-01-28 ENCOUNTER — Other Ambulatory Visit (HOSPITAL_COMMUNITY): Payer: Self-pay

## 2021-01-29 ENCOUNTER — Other Ambulatory Visit (HOSPITAL_COMMUNITY): Payer: Self-pay

## 2021-01-29 MED ORDER — ALPRAZOLAM 0.25 MG PO TABS
0.2500 mg | ORAL_TABLET | Freq: Every evening | ORAL | 2 refills | Status: AC | PRN
  Filled 2021-02-01: qty 30, 30d supply, fill #0
  Filled 2021-02-28 – 2021-03-01 (×2): qty 30, 30d supply, fill #1
  Filled ????-??-??: fill #1

## 2021-01-30 ENCOUNTER — Ambulatory Visit: Payer: Medicare Other | Admitting: Physician Assistant

## 2021-01-31 ENCOUNTER — Other Ambulatory Visit (HOSPITAL_COMMUNITY): Payer: Self-pay

## 2021-02-02 ENCOUNTER — Other Ambulatory Visit (HOSPITAL_COMMUNITY): Payer: Self-pay

## 2021-02-22 ENCOUNTER — Other Ambulatory Visit: Payer: Self-pay

## 2021-02-22 ENCOUNTER — Ambulatory Visit (HOSPITAL_COMMUNITY): Payer: Medicare Other | Attending: Cardiology

## 2021-02-22 DIAGNOSIS — R0600 Dyspnea, unspecified: Secondary | ICD-10-CM | POA: Diagnosis not present

## 2021-02-22 LAB — ECHOCARDIOGRAM COMPLETE
Area-P 1/2: 2.88 cm2
P 1/2 time: 608 msec
S' Lateral: 2.6 cm

## 2021-02-28 ENCOUNTER — Other Ambulatory Visit (HOSPITAL_COMMUNITY): Payer: Self-pay

## 2021-02-28 ENCOUNTER — Encounter: Payer: Self-pay | Admitting: Cardiovascular Disease

## 2021-03-01 ENCOUNTER — Other Ambulatory Visit (HOSPITAL_COMMUNITY): Payer: Self-pay

## 2021-03-01 DIAGNOSIS — R110 Nausea: Secondary | ICD-10-CM | POA: Diagnosis not present

## 2021-03-07 ENCOUNTER — Other Ambulatory Visit: Payer: Self-pay | Admitting: Surgery

## 2021-03-07 DIAGNOSIS — R110 Nausea: Secondary | ICD-10-CM

## 2021-03-08 ENCOUNTER — Other Ambulatory Visit: Payer: Self-pay | Admitting: Surgery

## 2021-03-08 ENCOUNTER — Ambulatory Visit
Admission: RE | Admit: 2021-03-08 | Discharge: 2021-03-08 | Disposition: A | Discharge status: Home / Self Care | Payer: Medicare Other | Source: Ambulatory Visit | Attending: Surgery | Admitting: Surgery

## 2021-03-08 DIAGNOSIS — R1013 Epigastric pain: Secondary | ICD-10-CM | POA: Diagnosis not present

## 2021-03-08 DIAGNOSIS — K224 Dyskinesia of esophagus: Secondary | ICD-10-CM | POA: Diagnosis not present

## 2021-03-08 DIAGNOSIS — R110 Nausea: Secondary | ICD-10-CM

## 2021-03-21 DIAGNOSIS — M6281 Muscle weakness (generalized): Secondary | ICD-10-CM | POA: Diagnosis not present

## 2021-03-21 DIAGNOSIS — M25561 Pain in right knee: Secondary | ICD-10-CM | POA: Diagnosis not present

## 2021-03-21 DIAGNOSIS — M25552 Pain in left hip: Secondary | ICD-10-CM | POA: Diagnosis not present

## 2021-03-21 DIAGNOSIS — M25551 Pain in right hip: Secondary | ICD-10-CM | POA: Diagnosis not present

## 2021-03-23 DIAGNOSIS — M25551 Pain in right hip: Secondary | ICD-10-CM | POA: Diagnosis not present

## 2021-03-23 DIAGNOSIS — M25561 Pain in right knee: Secondary | ICD-10-CM | POA: Diagnosis not present

## 2021-03-23 DIAGNOSIS — M25552 Pain in left hip: Secondary | ICD-10-CM | POA: Diagnosis not present

## 2021-03-23 DIAGNOSIS — M6281 Muscle weakness (generalized): Secondary | ICD-10-CM | POA: Diagnosis not present

## 2021-03-25 DIAGNOSIS — Z23 Encounter for immunization: Secondary | ICD-10-CM | POA: Diagnosis not present

## 2021-03-28 DIAGNOSIS — M25561 Pain in right knee: Secondary | ICD-10-CM | POA: Diagnosis not present

## 2021-03-28 DIAGNOSIS — M25551 Pain in right hip: Secondary | ICD-10-CM | POA: Diagnosis not present

## 2021-03-28 DIAGNOSIS — M6281 Muscle weakness (generalized): Secondary | ICD-10-CM | POA: Diagnosis not present

## 2021-03-28 DIAGNOSIS — M25552 Pain in left hip: Secondary | ICD-10-CM | POA: Diagnosis not present

## 2021-03-30 DIAGNOSIS — M6281 Muscle weakness (generalized): Secondary | ICD-10-CM | POA: Diagnosis not present

## 2021-03-30 DIAGNOSIS — M25561 Pain in right knee: Secondary | ICD-10-CM | POA: Diagnosis not present

## 2021-03-30 DIAGNOSIS — M25551 Pain in right hip: Secondary | ICD-10-CM | POA: Diagnosis not present

## 2021-03-30 DIAGNOSIS — M25552 Pain in left hip: Secondary | ICD-10-CM | POA: Diagnosis not present

## 2021-04-03 ENCOUNTER — Other Ambulatory Visit (HOSPITAL_COMMUNITY): Payer: Self-pay

## 2021-04-04 ENCOUNTER — Other Ambulatory Visit (HOSPITAL_COMMUNITY): Payer: Self-pay

## 2021-04-05 DIAGNOSIS — K5904 Chronic idiopathic constipation: Secondary | ICD-10-CM | POA: Diagnosis not present

## 2021-04-05 DIAGNOSIS — N952 Postmenopausal atrophic vaginitis: Secondary | ICD-10-CM | POA: Diagnosis not present

## 2021-04-05 DIAGNOSIS — N390 Urinary tract infection, site not specified: Secondary | ICD-10-CM | POA: Diagnosis not present

## 2021-04-09 DIAGNOSIS — M25552 Pain in left hip: Secondary | ICD-10-CM | POA: Diagnosis not present

## 2021-04-09 DIAGNOSIS — M25561 Pain in right knee: Secondary | ICD-10-CM | POA: Diagnosis not present

## 2021-04-09 DIAGNOSIS — M25551 Pain in right hip: Secondary | ICD-10-CM | POA: Diagnosis not present

## 2021-04-09 DIAGNOSIS — M6281 Muscle weakness (generalized): Secondary | ICD-10-CM | POA: Diagnosis not present

## 2021-04-12 DIAGNOSIS — M25552 Pain in left hip: Secondary | ICD-10-CM | POA: Diagnosis not present

## 2021-04-12 DIAGNOSIS — M6281 Muscle weakness (generalized): Secondary | ICD-10-CM | POA: Diagnosis not present

## 2021-04-12 DIAGNOSIS — M25561 Pain in right knee: Secondary | ICD-10-CM | POA: Diagnosis not present

## 2021-04-12 DIAGNOSIS — M25551 Pain in right hip: Secondary | ICD-10-CM | POA: Diagnosis not present

## 2021-04-16 DIAGNOSIS — M25561 Pain in right knee: Secondary | ICD-10-CM | POA: Diagnosis not present

## 2021-04-16 DIAGNOSIS — M6281 Muscle weakness (generalized): Secondary | ICD-10-CM | POA: Diagnosis not present

## 2021-04-16 DIAGNOSIS — M25551 Pain in right hip: Secondary | ICD-10-CM | POA: Diagnosis not present

## 2021-04-16 DIAGNOSIS — M25552 Pain in left hip: Secondary | ICD-10-CM | POA: Diagnosis not present

## 2021-04-17 ENCOUNTER — Ambulatory Visit: Payer: Medicare Other | Admitting: Cardiovascular Disease

## 2021-04-18 ENCOUNTER — Encounter: Payer: Self-pay | Admitting: Cardiovascular Disease

## 2021-04-18 DIAGNOSIS — R0600 Dyspnea, unspecified: Secondary | ICD-10-CM

## 2021-04-18 MED ORDER — CANDESARTAN CILEXETIL 16 MG PO TABS: 16.0000 mg | ORAL_TABLET | Freq: Every day | ORAL | 1 refills | Status: DC

## 2021-04-18 MED ORDER — SPIRONOLACTONE 25 MG PO TABS: ORAL_TABLET | ORAL | 1 refills | Status: DC

## 2021-04-18 MED ORDER — PRAVASTATIN SODIUM 40 MG PO TABS: ORAL_TABLET | Freq: Every evening | ORAL | 1 refills | Status: DC

## 2021-04-20 DIAGNOSIS — M25552 Pain in left hip: Secondary | ICD-10-CM | POA: Diagnosis not present

## 2021-04-20 DIAGNOSIS — M25561 Pain in right knee: Secondary | ICD-10-CM | POA: Diagnosis not present

## 2021-04-20 DIAGNOSIS — M25551 Pain in right hip: Secondary | ICD-10-CM | POA: Diagnosis not present

## 2021-04-20 DIAGNOSIS — M6281 Muscle weakness (generalized): Secondary | ICD-10-CM | POA: Diagnosis not present

## 2021-04-23 DIAGNOSIS — M25551 Pain in right hip: Secondary | ICD-10-CM | POA: Diagnosis not present

## 2021-04-23 DIAGNOSIS — M25561 Pain in right knee: Secondary | ICD-10-CM | POA: Diagnosis not present

## 2021-04-23 DIAGNOSIS — M25552 Pain in left hip: Secondary | ICD-10-CM | POA: Diagnosis not present

## 2021-04-23 DIAGNOSIS — M6281 Muscle weakness (generalized): Secondary | ICD-10-CM | POA: Diagnosis not present

## 2021-04-26 DIAGNOSIS — M25551 Pain in right hip: Secondary | ICD-10-CM | POA: Diagnosis not present

## 2021-04-26 DIAGNOSIS — M25552 Pain in left hip: Secondary | ICD-10-CM | POA: Diagnosis not present

## 2021-04-26 DIAGNOSIS — M25561 Pain in right knee: Secondary | ICD-10-CM | POA: Diagnosis not present

## 2021-04-26 DIAGNOSIS — M6281 Muscle weakness (generalized): Secondary | ICD-10-CM | POA: Diagnosis not present

## 2021-04-28 DIAGNOSIS — Z20822 Contact with and (suspected) exposure to covid-19: Secondary | ICD-10-CM | POA: Diagnosis not present

## 2021-04-30 DIAGNOSIS — M25552 Pain in left hip: Secondary | ICD-10-CM | POA: Diagnosis not present

## 2021-04-30 DIAGNOSIS — M25561 Pain in right knee: Secondary | ICD-10-CM | POA: Diagnosis not present

## 2021-04-30 DIAGNOSIS — M6281 Muscle weakness (generalized): Secondary | ICD-10-CM | POA: Diagnosis not present

## 2021-04-30 DIAGNOSIS — M25551 Pain in right hip: Secondary | ICD-10-CM | POA: Diagnosis not present

## 2021-05-03 DIAGNOSIS — M25551 Pain in right hip: Secondary | ICD-10-CM | POA: Diagnosis not present

## 2021-05-03 DIAGNOSIS — M6281 Muscle weakness (generalized): Secondary | ICD-10-CM | POA: Diagnosis not present

## 2021-05-03 DIAGNOSIS — M25552 Pain in left hip: Secondary | ICD-10-CM | POA: Diagnosis not present

## 2021-05-03 DIAGNOSIS — M25561 Pain in right knee: Secondary | ICD-10-CM | POA: Diagnosis not present

## 2021-05-08 DIAGNOSIS — M25552 Pain in left hip: Secondary | ICD-10-CM | POA: Diagnosis not present

## 2021-05-08 DIAGNOSIS — M25551 Pain in right hip: Secondary | ICD-10-CM | POA: Diagnosis not present

## 2021-05-08 DIAGNOSIS — M25561 Pain in right knee: Secondary | ICD-10-CM | POA: Diagnosis not present

## 2021-05-08 DIAGNOSIS — M6281 Muscle weakness (generalized): Secondary | ICD-10-CM | POA: Diagnosis not present

## 2021-05-10 DIAGNOSIS — M25561 Pain in right knee: Secondary | ICD-10-CM | POA: Diagnosis not present

## 2021-05-10 DIAGNOSIS — M25551 Pain in right hip: Secondary | ICD-10-CM | POA: Diagnosis not present

## 2021-05-10 DIAGNOSIS — M6281 Muscle weakness (generalized): Secondary | ICD-10-CM | POA: Diagnosis not present

## 2021-05-10 DIAGNOSIS — M25552 Pain in left hip: Secondary | ICD-10-CM | POA: Diagnosis not present

## 2021-05-14 DIAGNOSIS — M25552 Pain in left hip: Secondary | ICD-10-CM | POA: Diagnosis not present

## 2021-05-14 DIAGNOSIS — M6281 Muscle weakness (generalized): Secondary | ICD-10-CM | POA: Diagnosis not present

## 2021-05-14 DIAGNOSIS — M25561 Pain in right knee: Secondary | ICD-10-CM | POA: Diagnosis not present

## 2021-05-14 DIAGNOSIS — M25551 Pain in right hip: Secondary | ICD-10-CM | POA: Diagnosis not present

## 2021-05-16 DIAGNOSIS — R399 Unspecified symptoms and signs involving the genitourinary system: Secondary | ICD-10-CM | POA: Diagnosis not present

## 2021-05-22 DIAGNOSIS — M6281 Muscle weakness (generalized): Secondary | ICD-10-CM | POA: Diagnosis not present

## 2021-05-22 DIAGNOSIS — M25552 Pain in left hip: Secondary | ICD-10-CM | POA: Diagnosis not present

## 2021-05-22 DIAGNOSIS — M25561 Pain in right knee: Secondary | ICD-10-CM | POA: Diagnosis not present

## 2021-05-22 DIAGNOSIS — M25551 Pain in right hip: Secondary | ICD-10-CM | POA: Diagnosis not present

## 2021-05-24 DIAGNOSIS — M25552 Pain in left hip: Secondary | ICD-10-CM | POA: Diagnosis not present

## 2021-05-24 DIAGNOSIS — M25551 Pain in right hip: Secondary | ICD-10-CM | POA: Diagnosis not present

## 2021-05-24 DIAGNOSIS — M25561 Pain in right knee: Secondary | ICD-10-CM | POA: Diagnosis not present

## 2021-05-24 DIAGNOSIS — M6281 Muscle weakness (generalized): Secondary | ICD-10-CM | POA: Diagnosis not present

## 2021-05-26 DIAGNOSIS — Z20822 Contact with and (suspected) exposure to covid-19: Secondary | ICD-10-CM | POA: Diagnosis not present

## 2021-05-28 ENCOUNTER — Encounter: Payer: Self-pay | Admitting: Cardiovascular Disease

## 2021-05-28 DIAGNOSIS — M25561 Pain in right knee: Secondary | ICD-10-CM | POA: Diagnosis not present

## 2021-05-28 DIAGNOSIS — M25552 Pain in left hip: Secondary | ICD-10-CM | POA: Diagnosis not present

## 2021-05-28 DIAGNOSIS — M25551 Pain in right hip: Secondary | ICD-10-CM | POA: Diagnosis not present

## 2021-05-28 DIAGNOSIS — M6281 Muscle weakness (generalized): Secondary | ICD-10-CM | POA: Diagnosis not present

## 2021-05-29 ENCOUNTER — Other Ambulatory Visit: Payer: Self-pay

## 2021-05-29 MED ORDER — EZETIMIBE 10 MG PO TABS: 10.0000 mg | ORAL_TABLET | Freq: Every day | ORAL | 1 refills | Status: DC

## 2021-05-31 DIAGNOSIS — M25552 Pain in left hip: Secondary | ICD-10-CM | POA: Diagnosis not present

## 2021-05-31 DIAGNOSIS — M25551 Pain in right hip: Secondary | ICD-10-CM | POA: Diagnosis not present

## 2021-05-31 DIAGNOSIS — M25561 Pain in right knee: Secondary | ICD-10-CM | POA: Diagnosis not present

## 2021-05-31 DIAGNOSIS — M6281 Muscle weakness (generalized): Secondary | ICD-10-CM | POA: Diagnosis not present

## 2021-06-07 DIAGNOSIS — M25561 Pain in right knee: Secondary | ICD-10-CM | POA: Diagnosis not present

## 2021-06-07 DIAGNOSIS — Z20822 Contact with and (suspected) exposure to covid-19: Secondary | ICD-10-CM | POA: Diagnosis not present

## 2021-06-07 DIAGNOSIS — M25552 Pain in left hip: Secondary | ICD-10-CM | POA: Diagnosis not present

## 2021-06-07 DIAGNOSIS — M25551 Pain in right hip: Secondary | ICD-10-CM | POA: Diagnosis not present

## 2021-06-07 DIAGNOSIS — Z20828 Contact with and (suspected) exposure to other viral communicable diseases: Secondary | ICD-10-CM | POA: Diagnosis not present

## 2021-06-07 DIAGNOSIS — M6281 Muscle weakness (generalized): Secondary | ICD-10-CM | POA: Diagnosis not present

## 2021-06-08 DIAGNOSIS — N390 Urinary tract infection, site not specified: Secondary | ICD-10-CM | POA: Diagnosis not present

## 2021-06-10 DIAGNOSIS — Z20822 Contact with and (suspected) exposure to covid-19: Secondary | ICD-10-CM | POA: Diagnosis not present

## 2021-06-11 DIAGNOSIS — M25552 Pain in left hip: Secondary | ICD-10-CM | POA: Diagnosis not present

## 2021-06-11 DIAGNOSIS — M25561 Pain in right knee: Secondary | ICD-10-CM | POA: Diagnosis not present

## 2021-06-11 DIAGNOSIS — M25551 Pain in right hip: Secondary | ICD-10-CM | POA: Diagnosis not present

## 2021-06-11 DIAGNOSIS — M6281 Muscle weakness (generalized): Secondary | ICD-10-CM | POA: Diagnosis not present

## 2021-06-14 DIAGNOSIS — M25561 Pain in right knee: Secondary | ICD-10-CM | POA: Diagnosis not present

## 2021-06-14 DIAGNOSIS — M6281 Muscle weakness (generalized): Secondary | ICD-10-CM | POA: Diagnosis not present

## 2021-06-14 DIAGNOSIS — M25551 Pain in right hip: Secondary | ICD-10-CM | POA: Diagnosis not present

## 2021-06-14 DIAGNOSIS — M25552 Pain in left hip: Secondary | ICD-10-CM | POA: Diagnosis not present

## 2021-06-19 DIAGNOSIS — M25561 Pain in right knee: Secondary | ICD-10-CM | POA: Diagnosis not present

## 2021-06-19 DIAGNOSIS — M25552 Pain in left hip: Secondary | ICD-10-CM | POA: Diagnosis not present

## 2021-06-19 DIAGNOSIS — M25551 Pain in right hip: Secondary | ICD-10-CM | POA: Diagnosis not present

## 2021-06-19 DIAGNOSIS — M6281 Muscle weakness (generalized): Secondary | ICD-10-CM | POA: Diagnosis not present

## 2021-06-21 DIAGNOSIS — M25552 Pain in left hip: Secondary | ICD-10-CM | POA: Diagnosis not present

## 2021-06-21 DIAGNOSIS — M25551 Pain in right hip: Secondary | ICD-10-CM | POA: Diagnosis not present

## 2021-06-21 DIAGNOSIS — M25561 Pain in right knee: Secondary | ICD-10-CM | POA: Diagnosis not present

## 2021-06-21 DIAGNOSIS — M6281 Muscle weakness (generalized): Secondary | ICD-10-CM | POA: Diagnosis not present

## 2021-06-25 ENCOUNTER — Encounter: Payer: Self-pay | Admitting: Cardiovascular Disease

## 2021-06-25 ENCOUNTER — Ambulatory Visit (INDEPENDENT_AMBULATORY_CARE_PROVIDER_SITE_OTHER): Payer: Medicare Other | Admitting: Cardiovascular Disease

## 2021-06-25 DIAGNOSIS — E7800 Pure hypercholesterolemia, unspecified: Secondary | ICD-10-CM

## 2021-06-25 DIAGNOSIS — K449 Diaphragmatic hernia without obstruction or gangrene: Secondary | ICD-10-CM | POA: Diagnosis not present

## 2021-06-25 DIAGNOSIS — U071 COVID-19: Secondary | ICD-10-CM | POA: Diagnosis not present

## 2021-06-25 DIAGNOSIS — Z87440 Personal history of urinary (tract) infections: Secondary | ICD-10-CM

## 2021-06-25 DIAGNOSIS — I10 Essential (primary) hypertension: Secondary | ICD-10-CM | POA: Diagnosis not present

## 2021-06-25 DIAGNOSIS — I5189 Other ill-defined heart diseases: Secondary | ICD-10-CM

## 2021-06-25 DIAGNOSIS — R0600 Dyspnea, unspecified: Secondary | ICD-10-CM | POA: Diagnosis not present

## 2021-06-25 MED ORDER — VALSARTAN 160 MG PO TABS: 80.0000 mg | ORAL_TABLET | Freq: Two times a day (BID) | ORAL | 6 refills | Status: DC

## 2021-06-25 MED ORDER — SPIRONOLACTONE 25 MG PO TABS: 12.5000 mg | ORAL_TABLET | Freq: Every day | ORAL | 6 refills | Status: DC

## 2021-06-25 NOTE — Progress Notes (Signed)
? ?Cardiology Office Note   ? ?Date:  06/25/2021  ? ?ID:  Jenny Bradford, DOB [birth year], MRN [id number] ? ?PCP:  Jenny Morning, DO  ?Cardiologist:  Jenny Majestic, MD  ? ?5 month F/U cardiology evaluation ? 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  I had cared for her father Jenny Bradford for many years who ultimately died on 2014-01-31 at [facility].  She established cardiology care with me in February 2019.  I last saw her in February 2022. She presents for an 9 month follow-up evaluation.  ? ?Ms. Jenny Bradford has a history of hypertension for many years.  Most recently, she has been on losartan 75 mg daily in addition to HCTZ 25 mg.  She also has a history of hypothyroidism and has been on levothyroxine at 50 ?g.  There is a history of hyperlipidemia and she has been on pravastatin 40 mg.  Her father had a history of significant abdominal aortic aneurysm.  She underwent abdominal aortic aneurysm screening and there was no significant aortic dilatation and instructed with a maximum aortic dimension at 2.3 cm.  She has a history of obstructive sleep apnea which was diagnosed in 2014 with an AHI of 14 per hour and RDI of 16.5 per hour.  She had significant oxygen desaturation to a nadir of 81% with rems sleep and had loud snoring.  Apparently should use CPAP for a short while but ultimately became intolerant was ultimately transitioned to an oral appliance for which she is followed by Jenny Bradford.  ? ?When I saw her in February 2019 she had begun to notice that despite taking medication for her blood pressure, her blood pressure elevated.  She was walking 4 days per week for 30 minutes at a time.  She denied any exertionally precipitation of chest pain.  She was unaware of any significant valvular abnormality, although her mother has aortic stenosis.  She also has a very large hiatal hernia and has seen Dr. Paulita Bradford and was told that in the future she may require  surgery. ? ?She underwent surgery for paraesophageal hernia repair with a Nissen fundoplication in March 8841.  She also has had progressive difficulty with osteoarthritis involving her neck, back, hands, feet, and feet.  He has not been able to exercise as regularly.  She is no longer working since she is unable to stand up the entire day.  She now sees Jenny Bradford for primary care.  She has been caring for her husband who is permanently disabled since age 32.   ? ?When I  saw her on December 08, 2017 as result of the generic impurities I had recommended she change from losartan 100 mg daily to olmesartan 40 mg.  Her lipid studies were not optimal on pravastatin and I suggested discontinuance of this and in its place started atorvastatin 40 mg.  An echo Doppler study in February 2019 showed an EF of 60 to 65% with grade 2 diastolic dysfunction, mild AR and mild MR.  An event monitor had shown sinus rhythm with low average heart rate at 50 and high average heart rate at 70 bpm.  She had several atrial couplets and one atrial triplet.  There were no episodes of atrial fibrillation.  There was no ventricular ectopy or pauses. ? ?I saw her in March 2020 after she had called the office and wished to be seen sooner than her scheduled appointment due  to progressive fatigue and particularly shortness of breath particularly while climbing steps.  She had been self adjusting some of her medications and was no longer on olmesartan but has been on valsartan 160 mg.  However she felt better when she cuts this in half and had been taking 80 mg twice a day.  Denied any chest pain and was walking 1-1 and half miles per day.  She was only using CPAP intermittently and had adamantly using a customized oral appliance.  ? ?She was evaluated in a telemedicine visit in June 2020.  At that time she was  on candesartan for ARB therapy.  Her dose was further titrated to 60 mg in the Bradford and 8 mg in the evening.  She was on  atorvastatin 40 mg on Monday Wednesday and Friday for hyperlipidemia.  She was using an oral appliance for obstructive sleep apnea from Jenny Bradford.  An echo Doppler study from August 13, 2018 showed normal systolic function with EF at 55 to 60%.  There was mild aortic sclerosis with mild AR.  The aortic root and ascending aorta and aortic arch were normal in dimensions. ? ?I last saw her on December 07, 2018 which time she was complaining of some muscle aching.  As result she stopped taking atorvastatin several weeks prior to that evaluation.  Previously she felt she tolerated pravastatin better.  She had noticed fatigue and at times low blood pressures.  She will be undergoing a hysterectomy in late January.  I reviewed laboratory from November 23, 2018.  BUN 13 creatinine 0.94.  LFTs normal.  Stable CBC.  Compared to March 2020, cholesterol increased from 140 to162.  LDL increased from 57 while on atorvastatin daily to 84 on her reduced dose.  During that evaluation, recommended institution of Zetia 10 mg.  2 weeks thereafter I recommended resumption of pravastatin which she had tolerated better than atorvastatin.  I also checked a CPK and erythrocyte sedimentation rate which were normal arguing against myopathy or rhabdomyolysis.  She was using her oral appliance for obstructive sleep apnea. ? ?I saw her in December 2020 and over the several months prior to that evaluation she denied any episodes of chest tightness or pressure.  She was contemplating whether or not to go through her elective neurectomy and pelvic floor reconstruction January with the spike in COVID-19 this is to be done by a urogynecologist at [facility].  She admits to fatigue.  She has not had recent laboratory on Zetia and and pravastatin which she has been taking 20 mg.  She continued to be on candesartan 16 mg, spironolactone 12.5 mg for hypertension and  levothyroxine 50 mcg for hypothyroidism.  During that evaluation she continued  to have some fatigue but admitted that she was not consistently using her oral appliance. ? ?I saw her in June 2021 and since her prior evaluation she was found to have positive ANA and was seen by Dr. Patrecia Pour.  She was started on aspirin therapy and subsequent laboratory showed improvement of inflammatory markers.  Subsequently she felt better.  She was never started on immunosuppressive therapy.  She has been wearing a knee brace and is followed by Dr. Erlinda Hong of orthopedics.  Most recently she did not tolerate a trial of atorvastatin for more aggressive lipid-lowering and resume taking pravastatin 20 mg in addition to Zetia for hyperlipidemia.  Her grandchild was born 75 weeks ago and she is now scheduled for her elective hysterectomy to be done in  August 2021. ? ?She was seen by me on April 17, 2020 and since her prior evaluation she continued to do well.   She underwent her urogynecologic surgery October 28, 2019 and tolerated this well.  She has been able to tolerate Zetia and Pravachol statin and lipid studies in October 2021 were markedly improved with a total cholesterol of 130, LDL of 51, triglycerides at 98 and HDL 59.  She did have some issues with reading where she would sense a gap and miss words.  She was evaluated by her ophthalmologist who felt her vision is stable.  She will be undergoing reassessment of ANA at follow-up with Dr. Patrecia Pour. ? ?I last saw her on January 25, 2021.  Since her prior evaluation, she developed COVID in August 2022 and had fever fatigue sore throat and cough for approximately 1 month.  Subsequently, she has noticed some right leg swelling.  She admits to shortness of breath which seems to occur with decreased activity from previously.  She admits that she has not been exercising regularly.  She also experiences pain in her right foot and has been diagnosed with increased arthritis and bone spurs.  She has been taking spironolactone 25 mg daily, she is on chronic cephalexin  for prevention of recurrent UTIs.  She continues to be on Zetia and pravastatin 40 mg for hyperlipidemia.  She is on candesartan 16 mg for hypertension.   ? ?Since I last saw her, she has had recurrent issues wi

## 2021-06-25 NOTE — Patient Instructions (Signed)
Medication Instructions:  ?STOP CANDESARTAN ? ?START VALSARTAN '80MG'$  (1/2 TAB) TWICE DAILY ? ?TAKE SPIRONOLACTONE 12.'5MG'$  DAILY OR EVERY-OTHER-DAY AS DISCUSSED ? ?*If you need a refill on your cardiac medications before your next appointment, please call your pharmacy* ? ?Lab Work:   Testing/Procedures:  ?NONE    NONE ? ?If you have labs (blood work) drawn today and your tests are completely normal, you will receive your results only by: ?MyChart Message (if you have MyChart) OR  A paper copy in the mail ?If you have any lab test that is abnormal or we need to change your treatment, we will call you to review the results. ? ?Follow-Up: ?Your next appointment:  6 month(s) In Person with DR Claiborne Billings   ? ?Please call our office 2 months in advance to schedule this appointment  ? ?At [facility], you and your health needs are our priority.  As part of our continuing mission to provide you with exceptional heart care, we have created designated Provider Care Teams.  These Care Teams include your primary Cardiologist (physician) and Advanced Practice Providers (APPs -  Physician Assistants and Nurse Practitioners) who all work together to provide you with the care you need, when you need it. ? ? ? ?Important Information About Sugar ? ? ? ? ? ? ? ?  ? ?

## 2021-06-26 DIAGNOSIS — M6281 Muscle weakness (generalized): Secondary | ICD-10-CM | POA: Diagnosis not present

## 2021-06-26 DIAGNOSIS — M25561 Pain in right knee: Secondary | ICD-10-CM | POA: Diagnosis not present

## 2021-06-26 DIAGNOSIS — M25551 Pain in right hip: Secondary | ICD-10-CM | POA: Diagnosis not present

## 2021-06-26 DIAGNOSIS — M25552 Pain in left hip: Secondary | ICD-10-CM | POA: Diagnosis not present

## 2021-06-28 ENCOUNTER — Other Ambulatory Visit: Payer: Self-pay

## 2021-06-28 DIAGNOSIS — M6281 Muscle weakness (generalized): Secondary | ICD-10-CM | POA: Diagnosis not present

## 2021-06-28 DIAGNOSIS — M25552 Pain in left hip: Secondary | ICD-10-CM | POA: Diagnosis not present

## 2021-06-28 DIAGNOSIS — M25551 Pain in right hip: Secondary | ICD-10-CM | POA: Diagnosis not present

## 2021-06-28 DIAGNOSIS — M25561 Pain in right knee: Secondary | ICD-10-CM | POA: Diagnosis not present

## 2021-06-28 DIAGNOSIS — R0600 Dyspnea, unspecified: Secondary | ICD-10-CM

## 2021-06-28 MED ORDER — SPIRONOLACTONE 25 MG PO TABS: 12.5000 mg | ORAL_TABLET | Freq: Every day | ORAL | 6 refills | Status: DC

## 2021-07-02 DIAGNOSIS — M25561 Pain in right knee: Secondary | ICD-10-CM | POA: Diagnosis not present

## 2021-07-02 DIAGNOSIS — M6281 Muscle weakness (generalized): Secondary | ICD-10-CM | POA: Diagnosis not present

## 2021-07-02 DIAGNOSIS — M25551 Pain in right hip: Secondary | ICD-10-CM | POA: Diagnosis not present

## 2021-07-02 DIAGNOSIS — M25552 Pain in left hip: Secondary | ICD-10-CM | POA: Diagnosis not present

## 2021-07-07 DIAGNOSIS — Z20822 Contact with and (suspected) exposure to covid-19: Secondary | ICD-10-CM | POA: Diagnosis not present

## 2021-07-13 DIAGNOSIS — Z20822 Contact with and (suspected) exposure to covid-19: Secondary | ICD-10-CM | POA: Diagnosis not present

## 2021-07-15 DIAGNOSIS — Z20822 Contact with and (suspected) exposure to covid-19: Secondary | ICD-10-CM | POA: Diagnosis not present

## 2021-07-18 DIAGNOSIS — E785 Hyperlipidemia, unspecified: Secondary | ICD-10-CM | POA: Diagnosis not present

## 2021-07-18 DIAGNOSIS — E559 Vitamin D deficiency, unspecified: Secondary | ICD-10-CM | POA: Diagnosis not present

## 2021-07-18 DIAGNOSIS — I129 Hypertensive chronic kidney disease with stage 1 through stage 4 chronic kidney disease, or unspecified chronic kidney disease: Secondary | ICD-10-CM | POA: Diagnosis not present

## 2021-07-18 DIAGNOSIS — E039 Hypothyroidism, unspecified: Secondary | ICD-10-CM | POA: Diagnosis not present

## 2021-07-25 DIAGNOSIS — E559 Vitamin D deficiency, unspecified: Secondary | ICD-10-CM | POA: Diagnosis not present

## 2021-07-25 DIAGNOSIS — F5104 Psychophysiologic insomnia: Secondary | ICD-10-CM | POA: Diagnosis not present

## 2021-07-25 DIAGNOSIS — E7800 Pure hypercholesterolemia, unspecified: Secondary | ICD-10-CM | POA: Diagnosis not present

## 2021-07-25 DIAGNOSIS — I714 Abdominal aortic aneurysm, without rupture, unspecified: Secondary | ICD-10-CM | POA: Diagnosis not present

## 2021-07-25 DIAGNOSIS — N390 Urinary tract infection, site not specified: Secondary | ICD-10-CM | POA: Diagnosis not present

## 2021-07-25 DIAGNOSIS — N951 Menopausal and female climacteric states: Secondary | ICD-10-CM | POA: Diagnosis not present

## 2021-07-25 DIAGNOSIS — M159 Polyosteoarthritis, unspecified: Secondary | ICD-10-CM | POA: Diagnosis not present

## 2021-07-25 DIAGNOSIS — Z9109 Other allergy status, other than to drugs and biological substances: Secondary | ICD-10-CM | POA: Diagnosis not present

## 2021-07-25 DIAGNOSIS — E039 Hypothyroidism, unspecified: Secondary | ICD-10-CM | POA: Diagnosis not present

## 2021-07-25 DIAGNOSIS — G43709 Chronic migraine without aura, not intractable, without status migrainosus: Secondary | ICD-10-CM | POA: Diagnosis not present

## 2021-07-25 DIAGNOSIS — E785 Hyperlipidemia, unspecified: Secondary | ICD-10-CM | POA: Diagnosis not present

## 2021-07-25 DIAGNOSIS — I10 Essential (primary) hypertension: Secondary | ICD-10-CM | POA: Diagnosis not present

## 2021-07-29 ENCOUNTER — Other Ambulatory Visit (HOSPITAL_COMMUNITY): Payer: Self-pay

## 2021-07-29 ENCOUNTER — Encounter: Payer: Self-pay | Admitting: Orthopaedic Surgery

## 2021-07-30 ENCOUNTER — Other Ambulatory Visit (HOSPITAL_COMMUNITY): Payer: Self-pay

## 2021-07-30 ENCOUNTER — Other Ambulatory Visit: Payer: Self-pay | Admitting: Physician Assistant

## 2021-07-30 MED ORDER — SUMATRIPTAN SUCCINATE 100 MG PO TABS
ORAL_TABLET | ORAL | 4 refills | Status: AC
  Filled 2021-07-30: qty 9, 23d supply, fill #0

## 2021-07-30 MED ORDER — PREDNISONE 10 MG (21) PO TBPK
ORAL_TABLET | ORAL | 0 refills | Status: DC
Start: 2021-07-30 — End: 2022-08-21

## 2021-07-30 NOTE — Telephone Encounter (Signed)
sent 

## 2021-08-01 ENCOUNTER — Telehealth: Payer: Self-pay | Admitting: Rheumatology

## 2021-08-01 ENCOUNTER — Other Ambulatory Visit: Payer: Self-pay | Admitting: Obstetrics & Gynecology

## 2021-08-01 DIAGNOSIS — Z1231 Encounter for screening mammogram for malignant neoplasm of breast: Secondary | ICD-10-CM

## 2021-08-01 DIAGNOSIS — R768 Other specified abnormal immunological findings in serum: Secondary | ICD-10-CM

## 2021-08-01 NOTE — Telephone Encounter (Signed)
Patient advised she may have her labs done here at the office reviewed lab hours with patient and advised I have placed future orders for her to have them done. Patient will try to come into the office on 08/02/2021 to have them completed.

## 2021-08-01 NOTE — Telephone Encounter (Signed)
Patient left a voicemail stating she has an appointment with Dr. Estanislado Pandy on 08/22/21.  Patient requested a return call to let her know what types of labs need to be drawn for this appointment and when and where she needs to get them done.

## 2021-08-01 NOTE — Telephone Encounter (Signed)
ANA, double-stranded DNA, C3-C4, CBC, CMP, UA

## 2021-08-02 ENCOUNTER — Other Ambulatory Visit: Payer: Self-pay | Admitting: *Deleted

## 2021-08-02 DIAGNOSIS — R768 Other specified abnormal immunological findings in serum: Secondary | ICD-10-CM

## 2021-08-03 NOTE — Progress Notes (Signed)
LFTs are elevated most likely due to statin use.  Patient should avoid NSAIDs and alcohol use.  Please forward results to her PCP.  Rest of the labs are pending.

## 2021-08-04 LAB — URINALYSIS, ROUTINE W REFLEX MICROSCOPIC
Bilirubin Urine: NEGATIVE
Glucose, UA: NEGATIVE
Hgb urine dipstick: NEGATIVE
Ketones, ur: NEGATIVE
Leukocytes,Ua: NEGATIVE
Nitrite: NEGATIVE
Protein, ur: NEGATIVE
Specific Gravity, Urine: 1.01 (ref 1.001–1.035)
pH: 5.5 (ref 5.0–8.0)

## 2021-08-04 LAB — CBC WITH DIFFERENTIAL/PLATELET
Absolute Monocytes: 428 cells/uL (ref 200–950)
Basophils Absolute: 56 cells/uL (ref 0–200)
Basophils Relative: 0.6 %
Eosinophils Absolute: 130 cells/uL (ref 15–500)
Eosinophils Relative: 1.4 %
HCT: 44.2 % (ref 35.0–45.0)
Hemoglobin: 14.8 g/dL (ref 11.7–15.5)
Lymphs Abs: 930 cells/uL (ref 850–3900)
MCH: 32.5 pg (ref 27.0–33.0)
MCHC: 33.5 g/dL (ref 32.0–36.0)
MCV: 97.1 fL (ref 80.0–100.0)
MPV: 8.4 fL (ref 7.5–12.5)
Monocytes Relative: 4.6 %
Neutro Abs: 7756 cells/uL (ref 1500–7800)
Neutrophils Relative %: 83.4 %
Platelets: 290 10*3/uL (ref 140–400)
RBC: 4.55 10*6/uL (ref 3.80–5.10)
RDW: 12.3 % (ref 11.0–15.0)
Total Lymphocyte: 10 %
WBC: 9.3 10*3/uL (ref 3.8–10.8)

## 2021-08-04 LAB — COMPLETE METABOLIC PANEL WITH GFR
AG Ratio: 1.6 (calc) (ref 1.0–2.5)
ALT: 71 U/L — ABNORMAL HIGH (ref 6–29)
AST: 26 U/L (ref 10–35)
Albumin: 4.1 g/dL (ref 3.6–5.1)
Alkaline phosphatase (APISO): 54 U/L (ref 37–153)
BUN: 18 mg/dL (ref 7–25)
CO2: 26 mmol/L (ref 20–32)
Calcium: 9.5 mg/dL (ref 8.6–10.4)
Chloride: 100 mmol/L (ref 98–110)
Creat: 0.94 mg/dL (ref 0.50–1.05)
Globulin: 2.6 g/dL (calc) (ref 1.9–3.7)
Glucose, Bld: 82 mg/dL (ref 65–99)
Potassium: 4.6 mmol/L (ref 3.5–5.3)
Sodium: 135 mmol/L (ref 135–146)
Total Bilirubin: 0.5 mg/dL (ref 0.2–1.2)
Total Protein: 6.7 g/dL (ref 6.1–8.1)
eGFR: 66 mL/min/{1.73_m2} (ref >=60)

## 2021-08-04 LAB — SEDIMENTATION RATE: Sed Rate: 6 mm/h (ref 0–30)

## 2021-08-04 LAB — ANTI-DNA ANTIBODY, DOUBLE-STRANDED: ds DNA Ab: <1 IU/mL

## 2021-08-04 LAB — C3 AND C4
C3 Complement: 143 mg/dL (ref 83–193)
C4 Complement: 25 mg/dL (ref 15–57)

## 2021-08-04 LAB — ANTI-NUCLEAR AB-TITER (ANA TITER)
ANA TITER: 1:40 {titer} — ABNORMAL HIGH
ANA Titer 1: 1:640 {titer} — ABNORMAL HIGH

## 2021-08-04 LAB — ANA: Anti Nuclear Antibody (ANA): POSITIVE — AB

## 2021-08-05 NOTE — Progress Notes (Signed)
Labs are stable. I will discuss results at the fu visit.

## 2021-08-09 NOTE — Progress Notes (Signed)
Office Visit Note  Patient: Jenny Bradford             Date of Birth: 06-30-1950           MRN: [id number]             PCP: [name], DO Referring: [name], FNP Visit Date: 08/22/2021 Occupation: _0 @  Subjective:  Discuss lab results  History of Present Illness: Jenny Bradford is a 71 y.o. female with history of osteoarthritis and positive ANA.  She denies any history of oral ulcers, nasal ulcers, malar rash, photosensitivity, Raynaud's phenomenon or lymphadenopathy.  She continues to have dry eyes.  She states she has some joint pain and stiffness due to underlying osteoarthritis.  She denies any history of joint swelling.  Activities of Daily Living:  Patient reports morning stiffness for 15 minutes.   Patient Reports nocturnal pain.  Difficulty dressing/grooming: Denies Difficulty climbing stairs: Reports Difficulty getting out of chair: Denies Difficulty using hands for taps, buttons, cutlery, and/or writing: Reports  Review of Systems  Constitutional:  Positive for fatigue.  HENT:  Negative for mouth dryness.   Eyes:  Positive for dryness.  Respiratory:  Negative for shortness of breath.   Cardiovascular:  Negative for swelling in legs/feet.  Gastrointestinal:  Positive for constipation.  Endocrine: Positive for heat intolerance.  Genitourinary:  Negative for difficulty urinating.  Musculoskeletal:  Positive for joint pain, joint pain, morning stiffness and muscle tenderness.  Skin:  Negative for color change, rash and sensitivity to sunlight.  Allergic/Immunologic: Positive for susceptible to infections.  Neurological:  Negative for numbness.  Hematological:  Negative for bruising/bleeding tendency and swollen glands.  Psychiatric/Behavioral:  Positive for sleep disturbance. Negative for depressed mood. The patient is not nervous/anxious.     PMFS History:  Patient Active Problem List   Diagnosis Date Noted   Bilateral hip pain  01/02/2021   Primary osteoarthritis of left knee 01/26/2019   Primary osteoarthritis of right knee 01/26/2019   Palpitations 09/04/2017   Family history of coronary artery disease in father 09/04/2017   Paraesophageal hernia 05/13/2017   Neck pain 01/03/2017   Large hiatal hernia 12/28/2016   Multiple pulmonary nodules determined by computed tomography of lung 12/27/2016   Arthritis of carpometacarpal (CMC) joint of left thumb 05/28/2016   OSA (obstructive sleep apnea) 10/05/2012   HTN (hypertension) 10/05/2012   Hyperlipidemia 10/05/2012   Migraine headache 10/05/2012    Past Medical History:  Diagnosis Date   AAA (abdominal aortic aneurysm) ([place]) 08/2014   scanning every 2 years   Arthritis    oa   Complication of anesthesia    slow to awaken in past   Family history of anesthesia complication    slow to awaken   GERD (gastroesophageal reflux disease)    Grade II diastolic dysfunction 83/0940   Noted on ECHO   H/O hiatal hernia    Headache(784.0)    migraines (rare)   Hemorrhoids    Hyperlipidemia    Hypertension 04/01/11   ECHO-EF>55% NUC STRESS TEST- 05/01/12   Hypothyroidism    Mild aortic valve regurgitation 04/2017   Noted on ECHO   Mitral valve regurgitation 04/2017   Mild: Noted on ECHO   Pulmonary nodules 2018   scanning every 6 months   Sleep apnea 05/01/12 & 05/29/12   [facility], NO CPAP USED SINCE MAY 2015 Uses oral device    Family History  Problem Relation Age of Onset  Breast cancer Mother    Ovarian cancer Mother    Rheum arthritis Mother    Heart attack Father    Heart disease Father    Hypertension Brother    Heart disease Brother    Hyperlipidemia Maternal Grandmother    Hypertension Maternal Grandmother    Polymyalgia rheumatica Paternal Aunt    Hypertension Son    Migraines Daughter    Raynaud syndrome Daughter    Past Surgical History:  Procedure Laterality Date   ANKLE SURGERY Right 1980    CARPOMETACARPEL SUSPENSION PLASTY Right 12/14/2014   Procedure: RIGHT THUMB CARPOMETACARPAL ([place]) ARTHROPLASTY;  Surgeon: Leandrew Koyanagi, MD;  Location: [place];  Service: Orthopedics;  Laterality: Right;   CARPOMETACARPEL SUSPENSION PLASTY Left 03/19/2017   Procedure: Left Thumb Ligament Reconstruction and Tendon Interposition;  Surgeon: Leandrew Koyanagi, MD;  Location: [place];  Service: Orthopedics;  Laterality: Left;   COLONOSCOPY WITH PROPOFOL N/A 12/07/2013   Procedure: COLONOSCOPY WITH PROPOFOL;  Surgeon: Garlan Fair, MD;  Location: [facility];  Service: Endoscopy;  Laterality: N/A;   CYSTECTOMY  1975   FOOT SURGERY Right 12/01/2012   FOOT SURGERY Left 12/10/2011   HEMORRHOID SURGERY  2005   HERNIA REPAIR  2019   Miscarriage  1983   TONSILLECTOMY  1964   VAGINAL HYSTERECTOMY     With pelvic floor repair   Social History   Social History Narrative   Not on file   Immunization History  Administered Date(s) Administered   Influenza Whole 11/09/2016   Influenza,inj,quad, With Preservative 12/15/2013   Influenza-Unspecified 11/10/2014, 11/09/2017   Zoster Recombinat (Shingrix) 05/12/2018     Objective: Vital Signs: BP 131/75 (BP Location: Left Arm, Patient Position: Sitting, Cuff Size: Normal)   Pulse 62   Resp 16   Ht _0  (1.575 m)   Wt 159 lb (72.1 kg)   BMI 29.08 kg/m    Physical Exam Vitals and nursing note reviewed.  Constitutional:      Appearance: She is well-developed.  HENT:     Head: Normocephalic and atraumatic.  Eyes:     Conjunctiva/sclera: Conjunctivae normal.  Cardiovascular:     Rate and Rhythm: Normal rate and regular rhythm.     Heart sounds: Normal heart sounds.  Pulmonary:     Effort: Pulmonary effort is normal.     Breath sounds: Normal breath sounds.  Abdominal:     General: Bowel sounds are normal.     Palpations: Abdomen is soft.  Musculoskeletal:     Cervical back: Normal range of motion.   Lymphadenopathy:     Cervical: No cervical adenopathy.  Skin:    General: Skin is warm and dry.     Capillary Refill: Capillary refill takes less than 2 seconds.  Neurological:     Mental Status: She is alert and oriented to person, place, and time.  Psychiatric:        Behavior: Behavior normal.      Musculoskeletal Exam: C-spine was in good range of motion.  Thoracic kyphosis was noted.  Shoulder joints, elbow joints, wrist joints with good range of motion.  She had bilateral [place] surgery.  She had bilateral PIP and DIP thickening and subluxation of her right index finger DIP joint.  Hip joints and knee joints in good range of motion.  There was no tenderness over ankles or MTPs.  She had tenderness over bilateral trochanteric bursa.  CDAI Exam: CDAI Score: -- Patient Global: --; Provider Global: --  Swollen: --; Tender: -- Joint Exam 08/22/2021   No joint exam has been documented for this visit   There is currently no information documented on the homunculus. Go to the Rheumatology activity and complete the homunculus joint exam.  Investigation: No additional findings.  Imaging: No results found.  Recent Labs: Lab Results  Component Value Date   WBC 9.3 08/02/2021   HGB 14.8 08/02/2021   PLT 290 08/02/2021   NA 135 08/02/2021   K 4.6 08/02/2021   CL 100 08/02/2021   CO2 26 08/02/2021   GLUCOSE 82 08/02/2021   BUN 18 08/02/2021   CREATININE 0.94 08/02/2021   BILITOT 0.5 08/02/2021   ALKPHOS 71 11/23/2018   AST 26 08/02/2021   ALT 71 (H) 08/02/2021   PROT 6.7 08/02/2021   ALBUMIN 4.0 11/23/2018   CALCIUM 9.5 08/02/2021   GFRAA 74 11/23/2018     Speciality Comments: No specialty comments available.  Procedures:  No procedures performed Allergies: Midol [aspirin-cinnamedrine-caffeine], Olmesartan, Talwin [pentazocine], Bisoprolol, and Naproxen   Assessment / Plan:     Visit Diagnoses: Positive ANA (antinuclear antibody) - ANA 1: 640 centromere, 1: 40  speckled, dsDNA negative, C3-C4 normal, ESR 6. She has positive ANA centromere pattern and a speckled pattern.  She has no clinical features of scleroderma.  There is no history of Raynaud's phenomenon.  She denies any history of oral ulcers, nasal ulcers, malar rash, photosensitivity, Raynaud's phenomenon or lymphadenopathy.  There is no inflammatory arthritis.  I advised her to contact me in case she develops any new symptoms.  Shortness of breath - CT on 09/07/2018 for yearly surveillance of a pulmonary nodule.  Results revealed groundglass in part solid nodules in the lungs bilaterally-stable.  She is followed by Dr. Melvyn Novas.  Patient states that she will be getting repeat CT scan.  Elevated LFTs-her ALT 71 on Aug 02, 2021.  Patient states that she was on antibiotics for sinus infection.  She will have repeat ALT with her PCP.  Arthritis of carpometacarpal (CMC) joint of left thumb-she had bilateral CMC's surgery.  She had no tenderness over the [facility] joints today.  Joint protection muscle strengthening was discussed.  Trochanteric bursitis of both hips-she had tenderness over trochanteric bursa consistent with trochanteric bursitis.  She has been having some discomfort.  She states she tried physical therapy which was helpful.  Stretching exercises were demonstrated.  Primary osteoarthritis of both knees-she denies any discomfort in her knee joints today.  She has off-and-on discomfort.  DDD (degenerative disc disease), cervical-she had good range of motion of the cervical spine without discomfort.  Essential hypertension-blood pressure was normal today.  Other medical problems are listed as follows:  History of hyperlipidemia  Hx of migraines  Paraesophageal hernia  OSA (obstructive sleep apnea)  Recurrent UTI-patient states that she was having frequent UTIs.  She is doing better on prophylactic antibiotics.  Family history of rheumatoid arthritis  Family history of coronary artery  disease in father  Family history of Raynaud's phenomenon  Family history of polymyalgia rheumatica  Orders: No orders of the defined types were placed in this encounter.  No orders of the defined types were placed in this encounter.   Follow-Up Instructions: Return in about 1 year (around 08/23/2022) for Osteoarthritis.   Bo Merino, MD supervised to Fabio Neighbors no pressure  Note - This record has been created using [employer].  Chart creation errors have been sought, but may not always  have been located. Such creation errors do  not reflect on  the standard of medical care.

## 2021-08-15 ENCOUNTER — Ambulatory Visit: Payer: Medicare Other

## 2021-08-22 ENCOUNTER — Ambulatory Visit (INDEPENDENT_AMBULATORY_CARE_PROVIDER_SITE_OTHER): Payer: Medicare Other | Admitting: Rheumatology

## 2021-08-22 ENCOUNTER — Encounter: Payer: Self-pay | Admitting: Rheumatology

## 2021-08-22 VITALS — BP 131/75 | HR 62 | Resp 16 | Ht 62.0 in | Wt 159.0 lb

## 2021-08-22 DIAGNOSIS — R768 Other specified abnormal immunological findings in serum: Secondary | ICD-10-CM | POA: Diagnosis not present

## 2021-08-22 DIAGNOSIS — M7062 Trochanteric bursitis, left hip: Secondary | ICD-10-CM

## 2021-08-22 DIAGNOSIS — Z8669 Personal history of other diseases of the nervous system and sense organs: Secondary | ICD-10-CM

## 2021-08-22 DIAGNOSIS — I10 Essential (primary) hypertension: Secondary | ICD-10-CM | POA: Diagnosis not present

## 2021-08-22 DIAGNOSIS — G4733 Obstructive sleep apnea (adult) (pediatric): Secondary | ICD-10-CM | POA: Diagnosis not present

## 2021-08-22 DIAGNOSIS — R7989 Other specified abnormal findings of blood chemistry: Secondary | ICD-10-CM

## 2021-08-22 DIAGNOSIS — M7061 Trochanteric bursitis, right hip: Secondary | ICD-10-CM | POA: Diagnosis not present

## 2021-08-22 DIAGNOSIS — K449 Diaphragmatic hernia without obstruction or gangrene: Secondary | ICD-10-CM

## 2021-08-22 DIAGNOSIS — N390 Urinary tract infection, site not specified: Secondary | ICD-10-CM

## 2021-08-22 DIAGNOSIS — Z8639 Personal history of other endocrine, nutritional and metabolic disease: Secondary | ICD-10-CM

## 2021-08-22 DIAGNOSIS — Z8261 Family history of arthritis: Secondary | ICD-10-CM

## 2021-08-22 DIAGNOSIS — R7689 Other specified abnormal immunological findings in serum: Secondary | ICD-10-CM

## 2021-08-22 DIAGNOSIS — R0602 Shortness of breath: Secondary | ICD-10-CM

## 2021-08-22 DIAGNOSIS — M5030 Other cervical disc degeneration, unspecified cervical region: Secondary | ICD-10-CM

## 2021-08-22 DIAGNOSIS — M170 Bilateral primary osteoarthritis of knee: Secondary | ICD-10-CM

## 2021-08-22 DIAGNOSIS — M1812 Unilateral primary osteoarthritis of first carpometacarpal joint, left hand: Secondary | ICD-10-CM | POA: Diagnosis not present

## 2021-08-22 DIAGNOSIS — Z8269 Family history of other diseases of the musculoskeletal system and connective tissue: Secondary | ICD-10-CM

## 2021-08-22 DIAGNOSIS — Z8249 Family history of ischemic heart disease and other diseases of the circulatory system: Secondary | ICD-10-CM

## 2021-08-24 ENCOUNTER — Ambulatory Visit
Admission: RE | Admit: 2021-08-24 | Discharge: 2021-08-24 | Disposition: A | Discharge status: Home / Self Care | Payer: Medicare Other | Source: Ambulatory Visit | Attending: Obstetrics & Gynecology | Admitting: Obstetrics & Gynecology

## 2021-08-24 DIAGNOSIS — Z1231 Encounter for screening mammogram for malignant neoplasm of breast: Secondary | ICD-10-CM | POA: Diagnosis not present

## 2021-09-04 ENCOUNTER — Other Ambulatory Visit (HOSPITAL_COMMUNITY): Payer: Self-pay

## 2021-10-04 DIAGNOSIS — H2513 Age-related nuclear cataract, bilateral: Secondary | ICD-10-CM | POA: Diagnosis not present

## 2021-10-24 ENCOUNTER — Other Ambulatory Visit: Payer: Self-pay | Admitting: Cardiovascular Disease

## 2021-10-25 ENCOUNTER — Other Ambulatory Visit (HOSPITAL_COMMUNITY): Payer: Self-pay

## 2021-11-28 ENCOUNTER — Other Ambulatory Visit: Payer: Self-pay | Admitting: Internal Medicine

## 2021-11-28 DIAGNOSIS — R918 Other nonspecific abnormal finding of lung field: Secondary | ICD-10-CM

## 2021-12-08 ENCOUNTER — Ambulatory Visit (HOSPITAL_BASED_OUTPATIENT_CLINIC_OR_DEPARTMENT_OTHER)
Admission: RE | Admit: 2021-12-08 | Discharge: 2021-12-08 | Disposition: A | Discharge status: Home / Self Care | Payer: Medicare Other | Source: Ambulatory Visit | Attending: Internal Medicine | Admitting: Internal Medicine

## 2021-12-08 DIAGNOSIS — R918 Other nonspecific abnormal finding of lung field: Secondary | ICD-10-CM

## 2021-12-11 ENCOUNTER — Telehealth: Payer: Self-pay | Admitting: Internal Medicine

## 2021-12-12 NOTE — Telephone Encounter (Signed)
Spoke with the pt and went over results again  No CT needed unless starts having new symptoms  She verbalized understanding  Nothing further needed

## 2021-12-14 ENCOUNTER — Other Ambulatory Visit: Payer: Self-pay | Admitting: Cardiovascular Disease

## 2021-12-27 ENCOUNTER — Other Ambulatory Visit: Payer: Self-pay | Admitting: Cardiovascular Disease

## 2022-01-23 DIAGNOSIS — M8580 Other specified disorders of bone density and structure, unspecified site: Secondary | ICD-10-CM | POA: Diagnosis not present

## 2022-01-23 DIAGNOSIS — R7309 Other abnormal glucose: Secondary | ICD-10-CM | POA: Diagnosis not present

## 2022-01-23 DIAGNOSIS — E559 Vitamin D deficiency, unspecified: Secondary | ICD-10-CM | POA: Diagnosis not present

## 2022-01-23 DIAGNOSIS — E785 Hyperlipidemia, unspecified: Secondary | ICD-10-CM | POA: Diagnosis not present

## 2022-01-23 DIAGNOSIS — I10 Essential (primary) hypertension: Secondary | ICD-10-CM | POA: Diagnosis not present

## 2022-01-23 DIAGNOSIS — E039 Hypothyroidism, unspecified: Secondary | ICD-10-CM | POA: Diagnosis not present

## 2022-02-06 DIAGNOSIS — R7309 Other abnormal glucose: Secondary | ICD-10-CM | POA: Diagnosis not present

## 2022-02-06 DIAGNOSIS — E785 Hyperlipidemia, unspecified: Secondary | ICD-10-CM | POA: Diagnosis not present

## 2022-02-06 DIAGNOSIS — E039 Hypothyroidism, unspecified: Secondary | ICD-10-CM | POA: Diagnosis not present

## 2022-02-06 DIAGNOSIS — Z0000 Encounter for general adult medical examination without abnormal findings: Secondary | ICD-10-CM | POA: Diagnosis not present

## 2022-02-06 DIAGNOSIS — I10 Essential (primary) hypertension: Secondary | ICD-10-CM | POA: Diagnosis not present

## 2022-02-06 DIAGNOSIS — M8580 Other specified disorders of bone density and structure, unspecified site: Secondary | ICD-10-CM | POA: Diagnosis not present

## 2022-02-06 DIAGNOSIS — Z23 Encounter for immunization: Secondary | ICD-10-CM | POA: Diagnosis not present

## 2022-02-06 DIAGNOSIS — E559 Vitamin D deficiency, unspecified: Secondary | ICD-10-CM | POA: Diagnosis not present

## 2022-04-11 DIAGNOSIS — L820 Inflamed seborrheic keratosis: Secondary | ICD-10-CM | POA: Diagnosis not present

## 2022-04-11 DIAGNOSIS — D2262 Melanocytic nevi of left upper limb, including shoulder: Secondary | ICD-10-CM | POA: Diagnosis not present

## 2022-04-11 DIAGNOSIS — D485 Neoplasm of uncertain behavior of skin: Secondary | ICD-10-CM | POA: Diagnosis not present

## 2022-04-22 DIAGNOSIS — J019 Acute sinusitis, unspecified: Secondary | ICD-10-CM | POA: Diagnosis not present

## 2022-04-22 DIAGNOSIS — Z01419 Encounter for gynecological examination (general) (routine) without abnormal findings: Secondary | ICD-10-CM | POA: Diagnosis not present

## 2022-04-22 DIAGNOSIS — N3090 Cystitis, unspecified without hematuria: Secondary | ICD-10-CM | POA: Diagnosis not present

## 2022-04-22 DIAGNOSIS — N760 Acute vaginitis: Secondary | ICD-10-CM | POA: Diagnosis not present

## 2022-05-29 ENCOUNTER — Ambulatory Visit: Payer: Medicare Other | Attending: Cardiovascular Disease | Admitting: Cardiovascular Disease

## 2022-05-29 ENCOUNTER — Encounter: Payer: Self-pay | Admitting: Cardiovascular Disease

## 2022-05-29 VITALS — BP 118/72 | HR 66 | Ht 62.0 in | Wt 162.8 lb

## 2022-05-29 DIAGNOSIS — I10 Essential (primary) hypertension: Secondary | ICD-10-CM

## 2022-05-29 DIAGNOSIS — I5189 Other ill-defined heart diseases: Secondary | ICD-10-CM | POA: Diagnosis not present

## 2022-05-29 DIAGNOSIS — U071 COVID-19: Secondary | ICD-10-CM | POA: Diagnosis not present

## 2022-05-29 DIAGNOSIS — G4733 Obstructive sleep apnea (adult) (pediatric): Secondary | ICD-10-CM | POA: Diagnosis not present

## 2022-05-29 DIAGNOSIS — K449 Diaphragmatic hernia without obstruction or gangrene: Secondary | ICD-10-CM | POA: Diagnosis not present

## 2022-05-29 DIAGNOSIS — E7800 Pure hypercholesterolemia, unspecified: Secondary | ICD-10-CM | POA: Insufficient documentation

## 2022-05-29 DIAGNOSIS — E039 Hypothyroidism, unspecified: Secondary | ICD-10-CM | POA: Diagnosis not present

## 2022-05-29 NOTE — Progress Notes (Signed)
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  I had cared for her father Jenny Bradford for many years who ultimately died on 02/02/2014 at [facility].  She established cardiology care with me in February 2019.  I last saw her in February 2022. She presents for an 9 month follow-up evaluation.   Ms. Schmaltz has a history of hypertension for many years.  Most recently, she has been on losartan 75 mg daily in addition to HCTZ 25 mg.  She also has a history of hypothyroidism and has been on levothyroxine at 50 g.  There is a history of hyperlipidemia and she has been on pravastatin 40 mg.  Her father had a history of significant abdominal aortic aneurysm.  She underwent abdominal aortic aneurysm screening and there was no significant aortic dilatation and instructed with a maximum aortic dimension at 2.3 cm.  She has a history of obstructive sleep apnea which was diagnosed in 2014 with an AHI of 14 per hour and RDI of 16.5 per hour.  She had significant oxygen desaturation to a nadir of 81% with rems sleep and had loud snoring.  Apparently should use CPAP for a short while but ultimately became intolerant was ultimately transitioned to an oral appliance for which she is followed by [name].   When I saw her in February 2019 she had begun to notice that despite taking medication for her blood pressure, her blood pressure elevated.  She was walking 4 days per week for 30 minutes at a time.  She denied any exertionally precipitation of chest pain.  She was unaware of any significant valvular abnormality, although her mother has aortic stenosis.  She also has a very large hiatal hernia and has seen Dr. [name] and was told that in the future she may require  surgery.  She underwent surgery for paraesophageal hernia repair with a Nissen fundoplication in March [id number].  She also has had progressive difficulty with osteoarthritis involving her neck, back, hands, feet, and feet.  He has not been able to exercise as regularly.  She is no longer working since she is unable to stand up the entire day.  She now sees [name] for primary care.  She has been caring for her husband who is permanently disabled since age 45.    When I  saw her on December 08, 2017 as result of the generic impurities I had recommended she change from losartan 100 mg daily to olmesartan 40 mg.  Her lipid studies were not optimal on pravastatin and I suggested discontinuance of this and in its place started atorvastatin 40 mg.  An echo Doppler study in February 2019 showed an EF of 60 to 65% with grade 2 diastolic dysfunction, mild AR and mild MR.  An event monitor had shown sinus rhythm with low average heart rate at 50 and high average heart rate at 70 bpm.  She had several atrial couplets and one atrial triplet.  There were no episodes of atrial fibrillation.  There was no ventricular ectopy or pauses.  I saw her in March 2020 after she had called the office and wished to be seen sooner than her scheduled appointment due  to progressive fatigue and particularly shortness of breath particularly while climbing steps.  She had been self adjusting some of her medications and was no longer on olmesartan but has been on valsartan 160 mg.  However she felt better when she cuts this in half and had been taking 80 mg twice a day.  Denied any chest pain and was walking 1-1 and half miles per day.  She was only using CPAP intermittently and had adamantly using a customized oral appliance.   She was evaluated in a telemedicine visit in June 2020.  At that time she was  on candesartan for ARB therapy.  Her dose was further titrated to 60 mg in the morning and 8 mg in the evening.  She was on  atorvastatin 40 mg on Monday Wednesday and Friday for hyperlipidemia.  She was using an oral appliance for obstructive sleep apnea from [name].  An echo Doppler study from August 13, 2018 showed normal systolic function with EF at 55 to 60%.  There was mild aortic sclerosis with mild AR.  The aortic root and ascending aorta and aortic arch were normal in dimensions.  I saw her on December 07, 2018 which time she was complaining of some muscle aching.  As result she stopped taking atorvastatin several weeks prior to that evaluation.  Previously she felt she tolerated pravastatin better.  She had noticed fatigue and at times low blood pressures.  She will be undergoing a hysterectomy in late January.  I reviewed laboratory from November 23, 2018.  BUN 13 creatinine 0.94.  LFTs normal.  Stable CBC.  Compared to March 2020, cholesterol increased from 140 to162.  LDL increased from 57 while on atorvastatin daily to 84 on her reduced dose.  During that evaluation, recommended institution of Zetia 10 mg.  2 weeks thereafter I recommended resumption of pravastatin which she had tolerated better than atorvastatin.  I also checked a CPK and erythrocyte sedimentation rate which were normal arguing against myopathy or rhabdomyolysis.  She was using her oral appliance for obstructive sleep apnea.  I saw her in December 2020 and over the several months prior to that evaluation she denied any episodes of chest tightness or pressure.  She was contemplating whether or not to go through her elective neurectomy and pelvic floor reconstruction January with the spike in COVID-19 this is to be done by a urogynecologist at [facility].  She admits to fatigue.  She has not had recent laboratory on Zetia and and pravastatin which she has been taking 20 mg.  She continued to be on candesartan 16 mg, spironolactone 12.5 mg for hypertension and  levothyroxine 50 mcg for hypothyroidism.  During that evaluation she continued to  have some fatigue but admitted that she was not consistently using her oral appliance.  I saw her in June 2021 and since her prior evaluation she was found to have positive ANA and was seen by Dr. Patrecia Pour.  She was started on aspirin therapy and subsequent laboratory showed improvement of inflammatory markers.  Subsequently she felt better.  She was never started on immunosuppressive therapy.  She has been wearing a knee brace and is followed by Dr. Erlinda Hong of orthopedics.  Most recently she did not tolerate a trial of atorvastatin for more aggressive lipid-lowering and resume taking pravastatin 20 mg in addition to Zetia for hyperlipidemia.  Her grandchild was born 50 weeks ago and she is now scheduled for her elective hysterectomy to be done in August  2021.  She was seen by me on April 17, 2020 and since her prior evaluation she continued to do well.   She underwent her urogynecologic surgery October 28, 2019 and tolerated this well.  She has been able to tolerate Zetia and Pravachol statin and lipid studies in October 2021 were markedly improved with a total cholesterol of 130, LDL of 51, triglycerides at 98 and HDL 59.  She did have some issues with reading where she would sense a gap and miss words.  She was evaluated by her ophthalmologist who felt her vision is stable.  She will be undergoing reassessment of ANA at follow-up with Dr. Patrecia Pour.  I saw her on January 25, 2021.  Since her prior evaluation, she developed COVID in August 2022 and had fever fatigue sore throat and cough for approximately 1 month.  Subsequently, she has noticed some right leg swelling.  She admits to shortness of breath which seems to occur with decreased activity from previously.  She admits that she has not been exercising regularly.  She also experiences pain in her right foot and has been diagnosed with increased arthritis and bone spurs.  She has been taking spironolactone 25 mg daily, she is on chronic cephalexin for  prevention of recurrent UTIs.  She continues to be on Zetia and pravastatin 40 mg for hyperlipidemia.  She is on candesartan 16 mg for hypertension.    I last saw her on June 25, 2021 she was having issues with recurrent urinary tract infections over the past 18 months.  Over the past several weeks she was started on Uqora urinary flush.  Because of potential potassium and the significant increase B6 contributing to diuresis, she reduced her spironolactone from 25 mg daily to every second or third day.  She is scheduled to undergo repeat laboratory in approximately 1 to 2 weeks.  Her blood pressure has been stable but unfortunately the cost of candesartan has become significant.  Prior to initiating candesartan, she had been on valsartan 80 mg twice a day which she had tolerated well but was changed when impurities developed leading to a long period of inaccessibility.  She denies any chest pain or shortness of breath.  She is now fully retired.  She sees Dr. Estanislado Pandy for rheumatology.  For that evaluation, her blood pressure was stable.  I suggested she reduce spironolactone to 12.5 mg particularly with use of uqora which contains potassium.  Due to cost issues, I switched her back to valsartan which she had tolerated in the past but had switched to candesartan several years ago when valsartan had impurities from [place] labs.  Since I last saw her, she has had issues with losing a tooth and required implantation.  She had been using an oral appliance but could not use the device for close to a year.  She admits to increased fatigue and some shortness of breath.  She typically goes to bed late often at 1 AM and wakes up at 9 AM.  He denies any chest pain.  She denies palpitations.  She often sleeps in a recliner due to hip pain.  She presents for evaluation.  Past Medical History:  Diagnosis Date   AAA (abdominal aortic aneurysm) ([place]) 08/2014   scanning every 2 years   Arthritis    oa   Complication of  anesthesia    slow to awaken in past   Family history of anesthesia complication    slow to awaken   GERD (gastroesophageal reflux  disease)    Grade II diastolic dysfunction [id number]   Noted on ECHO   H/O hiatal hernia    Headache(784.0)    migraines (rare)   Hemorrhoids    Hyperlipidemia    Hypertension 04/01/11   ECHO-EF>55% NUC STRESS TEST- 05/01/12   Hypothyroidism    Mild aortic valve regurgitation 04/2017   Noted on ECHO   Mitral valve regurgitation 04/2017   Mild: Noted on ECHO   Pulmonary nodules 2018   scanning every 6 months   Sleep apnea 05/01/12 & 05/29/12   SLEEP STUDY-[place] [facility], NO CPAP USED SINCE MAY 2015 Uses oral device    Past Surgical History:  Procedure Laterality Date   ANKLE SURGERY Right [place] Right 12/14/2014   Procedure: RIGHT THUMB CARPOMETACARPAL ([place]) ARTHROPLASTY;  Surgeon: Leandrew Koyanagi, MD;  Location: [facility];  Service: Orthopedics;  Laterality: Right;   CARPOMETACARPEL SUSPENSION PLASTY Left 03/19/2017   Procedure: Left Thumb Ligament Reconstruction and Tendon Interposition;  Surgeon: Leandrew Koyanagi, MD;  Location: [facility];  Service: Orthopedics;  Laterality: Left;   COLONOSCOPY WITH PROPOFOL N/A 12/07/2013   Procedure: COLONOSCOPY WITH PROPOFOL;  Surgeon: Garlan Fair, MD;  Location: [facility];  Service: Endoscopy;  Laterality: N/A;   CYSTECTOMY  1975   FOOT SURGERY Right 12/01/2012   FOOT SURGERY Left 12/10/2011   HEMORRHOID SURGERY  2005   HERNIA REPAIR  2019   Miscarriage  1983   TONSILLECTOMY  1964   VAGINAL HYSTERECTOMY     With pelvic floor repair    Current Medications: Outpatient Medications Prior to Visit  Medication Sig Dispense Refill   acetaminophen (TYLENOL) 500 MG tablet Take 500 mg by mouth 2 (two) times daily.     ALPRAZolam (XANAX) 0.25 MG tablet TAKE 1 TABLET BY MOUTH AT BEDTIME AS NEEDED 30 tablet 2   cephALEXin (KEFLEX) 250 MG  capsule cephalexin 250 mg capsule     diclofenac Sodium (VOLTAREN) 1 % GEL Apply 2 g topically 4 (four) times daily.     Docusate Sodium 100 MG capsule Take 200 mg by mouth daily as needed for mild constipation. Takes 300 mg  as needed     escitalopram (LEXAPRO) 20 MG tablet Take 10-20 mg by mouth every morning. Hot flashes     estradiol (ESTRACE) 0.1 MG/GM vaginal cream Place 2 g vaginally 2 (two) times a week. USE TWICE WEEKLY     ezetimibe (ZETIA) 10 MG tablet TAKE ONE TABLET BY MOUTH DAILY 90 tablet 1   Glucosamine Sulfate 1000 MG CAPS      hydrocortisone 2.5 % cream Proctosol HC 2.5 % rectal cream with applicator     levothyroxine (SYNTHROID, LEVOTHROID) 50 MCG tablet Take 50 mcg by mouth daily before breakfast.     Melatonin 10 MG TABS Take 1 tablet by mouth at bedtime.     montelukast (SINGULAIR) 10 MG tablet TAKE 1 TABLET BY MOUTH EVERY DAY FOR 30 DAYS     pravastatin (PRAVACHOL) 40 MG tablet TAKE 1/2 TABLET BY MOUTH EVERY EVENING 120 tablet 2   predniSONE (STERAPRED UNI-PAK 21 TAB) 10 MG (21) TBPK tablet Take as directed 21 tablet 0   simethicone (MYLICON) 80 MG chewable tablet Chew 1 tablet (80 mg total) by mouth every 6 (six) hours as needed for flatulence (bloating). 30 tablet 0   spironolactone (ALDACTONE) 25 MG tablet Take 0.5 tablets (12.5 mg total) by mouth daily. 30 tablet 6  SUMAtriptan (IMITREX) 100 MG tablet TAKE 1 TABLET BY MOUTH AS NEEDED ONE TIME ONCE A DAY 9 tablet 4   traMADol (ULTRAM) 50 MG tablet 1-2 tablets     valsartan (DIOVAN) 160 MG tablet TAKE 0.5 TABLETS BY MOUTH TWO TIMES A DAY 90 tablet 3   hydrocortisone (PROCTOZONE-HC) 2.5 % rectal cream Proctozone-HC 2.5 % topical cream perineal applicator     Misc Natural Products (GLUCOSAMINE CHOND CMP ADVANCED) TABS      SUMAtriptan (IMITREX) 100 MG tablet TAKE 1 TABLET BY MOUTH AS NEEDED ONE TIME 9 tablet 3   No facility-administered medications prior to visit.     Allergies:   Midol  [aspirin-cinnamedrine-caffeine], Olmesartan, Talwin [pentazocine], Bisoprolol, and Naproxen   Social History   Socioeconomic History   Marital status: Married    Spouse name: Not on file   Number of children: Not on file   Years of education: Not on file   Highest education level: Not on file  Occupational History   Not on file  Tobacco Use   Smoking status: Never   Smokeless tobacco: Never  Vaping Use   Vaping Use: Never used  Substance and Sexual Activity   Alcohol use: No    Alcohol/week: 0.0 standard drinks of alcohol   Drug use: No   Sexual activity: Not on file  Other Topics Concern   Not on file  Social History Narrative   Not on file   Social Determinants of Health   Financial Resource Strain: Not on file  Food Insecurity: Not on file  Transportation Needs: Not on file  Physical Activity: Not on file  Stress: Not on file  Social Connections: Not on file    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  No tobacco history.  She is fully retired.  Family History:  The patient's family history includes Breast cancer in her mother; Heart attack in her father; Heart disease in her brother and father; Hyperlipidemia in her maternal grandmother; Hypertension in her brother, maternal grandmother, and son; Migraines in her daughter; Ovarian cancer in her mother; Polymyalgia rheumatica in her paternal aunt; Raynaud syndrome in her daughter; Rheum arthritis in her mother.   ROS General: Negative; No fevers, chills, or night sweats; increasing fatigue HEENT: Transient vision change negative; No changes  or hearing, sinus congestion, difficulty swallowing Pulmonary: Negative; No cough, wheezing, shortness of breath, hemoptysis Cardiovascular: See history of present illness, no chest pain, PND, orthopnea. GI: Positive for large hiatal hernia; is post [employer] fundoplication surgery GU: s/p elective hysterectomy and pelvic floor  reconstruction Musculoskeletal: Positive for right knee discomfort wearing a right knee brace Hematologic/Oncology: Negative; no easy bruising, bleeding Rheumatology: Positive ANA Endocrine: Negative; no heat/cold intolerance; no diabetes Neuro: Negative; no changes in balance, headaches Skin: Negative; No rashes or skin lesions Psychiatric: Negative; No behavioral problems, depression Sleep: Positive for OSA, now with a customized oral appliance with mandibular advancement; she has not used consistently over the past year due to issues from losing a tooth and requiring implant  Other comprehensive 14 point system review is negative.   PHYSICAL EXAM:   VS:  BP 118/72   Pulse 66   Ht 5\' 2"  (1.575 m)   Wt 162 lb 12.8 oz (73.8 kg)   SpO2 99%   BMI 29.78 kg/m     Repeat blood pressure by me was 120/70  Wt Readings from Last 3 Encounters:  05/29/22 162 lb 12.8 oz (73.8 kg)  08/22/21 159 lb (72.1 kg)  06/25/21 161 lb 6.4 oz (73.2 kg)    General: Alert, oriented, no distress.  Skin: normal turgor, no rashes, warm and dry HEENT: Normocephalic, atraumatic. Pupils equal round and reactive to light; sclera anicteric; extraocular muscles intact;  Nose without nasal septal hypertrophy Mouth/Parynx benign; Mallinpatti scale 3 Neck: No JVD, no carotid bruits; normal carotid upstroke Lungs: clear to ausculatation and percussion; no wheezing or rales Chest wall: without tenderness to palpitation Heart: PMI not displaced, RRR, s1 s2 normal, 1/6 systolic murmur, no diastolic murmur, no rubs, gallops, thrills, or heaves Abdomen: soft, nontender; no hepatosplenomehaly, BS+; abdominal aorta nontender and not dilated by palpation. Back: no CVA tenderness Pulses 2+ Musculoskeletal: full range of motion, normal strength, no joint deformities Extremities: no clubbing cyanosis or edema, Homan's sign negative  Neurologic: grossly nonfocal; Cranial nerves grossly wnl Psychologic: Normal mood and  affect    Studies/Labs Reviewed:   May 29, 2022 ECG (independently read by me): NSR at 66, PRWP V1-4  June 25, 2021 ECG (independently read by me):  NSR at 60, no ectopy  January 25, 2021 ECG (independently read by me):  NSR at 60, no ectopy  April 17, 2020 ECG (independently read by me): Sinus Bradycardia at 59; no ectopy, normal intervals  August 30, 2019 ECG (independently read by me): Sinus bradycardia 55 bpm.  No ectopy.  Normal intervals.  March 02, 2019 ECG (independently read by me): Normal sinus rhythm at 65 bpm.  No ectopy.  Normal intervals.  No ST segment changes.    September 2020 ECG (independently read by me): Normal sinus rhythm at 61 bpm.  Normal intervals.  No ectopy.  March 2020 ECG (independently read by me): Normal sinus rhythm at 69 bpm.  Poor anterior R wave progression V1 through V3.  December 08, 2017 ECG (independently read by me): NSR at 66; normal intervals  04/21/2017 ECG (independently read by me): Normal sinus rhythm at 66 bpm.  No ST segment changes.  Normal intervals.  No ectopy.  Very mild RV conduction delay.  Recent Labs:    Latest Ref Rng & Units 08/02/2021    2:02 PM 06/18/2019    1:55 PM 11/23/2018    9:18 AM  BMP  Glucose 65 - 99 mg/dL 82  71  80   BUN 7 - 25 mg/dL 18  21  13    Creatinine 0.50 - 1.05 mg/dL 0.94  0.89  0.94   BUN/Creat Ratio 6 - 22 (calc) NOT APPLICABLE  NOT APPLICABLE  14   Sodium 135 - 146 mmol/L 135  136  137   Potassium 3.5 - 5.3 mmol/L 4.6  4.5  4.6   Chloride 98 - 110 mmol/L 100  103  103   CO2 20 - 32 mmol/L 26  25  20    Calcium 8.6 - 10.4 mg/dL 9.5  9.3  9.4         Latest Ref Rng & Units 08/02/2021    2:02 PM 06/18/2019    1:55 PM 11/23/2018    9:18 AM  Hepatic Function  Total Protein 6.1 - 8.1 g/dL 6.7  6.5  6.6   Albumin 3.8 - 4.8 g/dL   4.0   AST 10 - 35 U/L 26  17  21    ALT 6 - 29 U/L 71  21  23   Alk Phosphatase 39 - 117 IU/L   71   Total Bilirubin 0.2 - 1.2 mg/dL 0.5  0.7  0.5  Latest Ref Rng & Units 08/02/2021    2:02 PM 06/18/2019    1:55 PM 11/23/2018    9:18 AM  CBC  WBC 3.8 - 10.8 Thousand/uL 9.3  5.8  5.0   Hemoglobin 11.7 - 15.5 g/dL 14.8  13.6  13.7   Hematocrit 35.0 - 45.0 % 44.2  41.0  40.1   Platelets 140 - 400 Thousand/uL 290  248  273    Lab Results  Component Value Date   MCV 97.1 08/02/2021   MCV 97.9 06/18/2019   MCV 97 11/23/2018   Lab Results  Component Value Date   TSH 1.830 11/23/2018   No results found for: "HGBA1C"   BNP    Component Value Date/Time   BNP 30.6 11/23/2018 0918    ProBNP No results found for: "PROBNP"   Lipid Panel     Component Value Date/Time   CHOL 147 09/09/2019 0951   TRIG 135 09/09/2019 0951   HDL 57 09/09/2019 0951   CHOLHDL 2.6 09/09/2019 0951   LDLCALC 67 09/09/2019 0951     RADIOLOGY: No results found.   Additional studies/ records that were reviewed today include:  I reviewed the patient's prior evaluation at the [facility] in 2014.  following a sleep study.  I reviewed her imaging studies, and prior evaluation by Dr. Melvyn Novas.  I reviewed her rheumatology evaluation from April 13, 2019   ASSESSMENT:    1. Essential hypertension   2. Grade I diastolic dysfunction   3. Pure hypercholesterolemia   4. OSA (obstructive sleep apnea)   5. Hypothyroidism, unspecified type   6. Large hiatal hernia s/p [employer] plication   7. Covid : August 2022     PLAN:  Ms. 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  Remotely she had been on losartan for hypertension and pravastatin for hyperlipidemia.  At prior office visits her  blood pressure has been well controlled on her regimen consisting of candesartan 16 mg and spironolactone 12.5 mg daily.  Remotely she had issues with myalgias on atorvastatin.  She has been able to tolerate Zetia 10 mg and  pravastatin 20 mg with prior lipid studies showing LDL at 51.  An echo Doppler study in June 2020 showed an EF of with grade 2 diastolic dysfunction, mild AR and mild MR.  She developed COVID in August 2022 and was ill for approximately a month.  Subsequent echo was done due to shortness of breath in December 2022 which continue to show normal LV function.  Diastolic dysfunction had improved and was now grade 1 and there was evidence for mild aortic sclerosis without stenosis.  At her last office visit, she was taken off candesartan due to cost issues.  Presently, blood pressure today is excellent on her regimen of valsartan which she takes 80 mg twice a day.  She also is on spironolactone and now takes is 12.5 mg daily.  There is no edema.  Over the past year she had issues with losing a tooth requiring dental implant and as result she was unable to use her oral appliance for close to a year.  Recently she admits to increased fatigue and shortness of breath.  Her sleep hygiene is poor.  She often stays up until 1 AM and often wakes up at 9 AM.  Oftentimes she sleeps in a recliner due to hip pain.  She continues to be on Zetia 10 mg and pravastatin 20 mg for hyperlipidemia.  She is followed by Dr. Theda Sers at [facility].  LDL cholesterol in November 2023 was 82.  Hemoglobin A1c was 5.4.  Creatinine 0.94.  I have recommended resumption of oral appliance use.  She may also benefit from future sleep assessment while wearing the oral appliance to make certain she is well treated.  I discussed improved sleep hygiene.  I discussed optimal sleep duration at 7 and 9 hours.  She sees Dr. Estanislado Pandy for her elevated ANA under surveillance for potential autoimmune disease.  I will see her in 6 months for reevaluation.    Medication Adjustments/Labs and Tests Ordered: Current medicines are reviewed at length with the patient today.  Concerns regarding medicines are outlined above.  Medication changes, Labs and Tests  ordered today are listed in the Patient Instructions below. Patient Instructions  Medication Instructions:  Your physician recommends that you continue on your current medications as directed. Please refer to the Current Medication list given to you today.  *If you need a refill on your cardiac medications before your next appointment, please call your pharmacy*  Testing/Procedures: Your physician has requested that you have an echocardiogram. Echocardiography is a painless test that uses sound waves to create images of your heart. It provides your doctor with information about the size and shape of your heart and how well your heart's chambers and valves are working. This procedure takes approximately one hour. There are no restrictions for this procedure. Please do NOT wear cologne, perfume, aftershave, or lotions (deodorant is allowed). Please arrive 15 minutes prior to your appointment time.  Follow-Up: At [facility], you and your health needs are our priority.  As part of our continuing mission to provide you with exceptional heart care, we have created designated Provider Care Teams.  These Care Teams include your primary Cardiologist (physician) and Advanced Practice Providers (APPs -  Physician Assistants and Nurse Practitioners) who all work together to provide you with the care you need, when you need it.  We recommend signing up for the patient portal called "MyChart".  Sign up information is provided on this After Visit Summary.  MyChart is used to connect with patients for Virtual Visits (Telemedicine).  Patients are able to view lab/test results, encounter notes, upcoming appointments, etc.  Non-urgent messages can be sent to your provider as well.   To learn more about what you can do with MyChart, go to [email or web address].    Your next appointment:   6 month(s)  Provider:   Dr. Claiborne Billings     Signed, [name], MD  06/05/2022 6:39 PM    [facility] [address], [place], [place], [place]  [address] Phone: [phone]

## 2022-05-29 NOTE — Patient Instructions (Signed)
Medication Instructions:  Your physician recommends that you continue on your current medications as directed. Please refer to the Current Medication list given to you today.  *If you need a refill on your cardiac medications before your next appointment, please call your pharmacy*  Testing/Procedures: Your physician has requested that you have an echocardiogram. Echocardiography is a painless test that uses sound waves to create images of your heart. It provides your doctor with information about the size and shape of your heart and how well your heart's chambers and valves are working. This procedure takes approximately one hour. There are no restrictions for this procedure. Please do NOT wear cologne, perfume, aftershave, or lotions (deodorant is allowed). Please arrive 15 minutes prior to your appointment time.  Follow-Up: At [facility], you and your health needs are our priority.  As part of our continuing mission to provide you with exceptional heart care, we have created designated Provider Care Teams.  These Care Teams include your primary Cardiologist (physician) and Advanced Practice Providers (APPs -  Physician Assistants and Nurse Practitioners) who all work together to provide you with the care you need, when you need it.  We recommend signing up for the patient portal called "MyChart".  Sign up information is provided on this After Visit Summary.  MyChart is used to connect with patients for Virtual Visits (Telemedicine).  Patients are able to view lab/test results, encounter notes, upcoming appointments, etc.  Non-urgent messages can be sent to your provider as well.   To learn more about what you can do with MyChart, go to [email or web address].    Your next appointment:   6 month(s)  Provider:   Dr. Claiborne Billings

## 2022-06-05 ENCOUNTER — Encounter: Payer: Self-pay | Admitting: Cardiovascular Disease

## 2022-06-10 ENCOUNTER — Ambulatory Visit (HOSPITAL_COMMUNITY): Payer: Medicare Other | Attending: Cardiovascular Disease

## 2022-06-10 DIAGNOSIS — I5189 Other ill-defined heart diseases: Secondary | ICD-10-CM | POA: Diagnosis not present

## 2022-06-10 DIAGNOSIS — I10 Essential (primary) hypertension: Secondary | ICD-10-CM | POA: Diagnosis not present

## 2022-06-10 LAB — ECHOCARDIOGRAM COMPLETE
Area-P 1/2: 1.91 cm2
P 1/2 time: 566 msec
S' Lateral: 2.1 cm

## 2022-06-26 ENCOUNTER — Other Ambulatory Visit: Payer: Self-pay | Admitting: Cardiovascular Disease

## 2022-06-26 DIAGNOSIS — R0600 Dyspnea, unspecified: Secondary | ICD-10-CM

## 2022-06-27 ENCOUNTER — Other Ambulatory Visit (HOSPITAL_COMMUNITY): Payer: Self-pay

## 2022-07-08 ENCOUNTER — Other Ambulatory Visit (HOSPITAL_COMMUNITY): Payer: Medicare Other

## 2022-07-31 DIAGNOSIS — I10 Essential (primary) hypertension: Secondary | ICD-10-CM | POA: Diagnosis not present

## 2022-07-31 DIAGNOSIS — E785 Hyperlipidemia, unspecified: Secondary | ICD-10-CM | POA: Diagnosis not present

## 2022-07-31 DIAGNOSIS — E559 Vitamin D deficiency, unspecified: Secondary | ICD-10-CM | POA: Diagnosis not present

## 2022-07-31 DIAGNOSIS — E039 Hypothyroidism, unspecified: Secondary | ICD-10-CM | POA: Diagnosis not present

## 2022-07-31 DIAGNOSIS — M8580 Other specified disorders of bone density and structure, unspecified site: Secondary | ICD-10-CM | POA: Diagnosis not present

## 2022-08-01 LAB — LAB REPORT - SCANNED: EGFR: 73

## 2022-08-06 ENCOUNTER — Other Ambulatory Visit: Payer: Self-pay | Admitting: Obstetrics & Gynecology

## 2022-08-06 ENCOUNTER — Other Ambulatory Visit: Payer: Self-pay | Admitting: *Deleted

## 2022-08-06 DIAGNOSIS — Z1231 Encounter for screening mammogram for malignant neoplasm of breast: Secondary | ICD-10-CM

## 2022-08-08 NOTE — Progress Notes (Signed)
Office Visit Note  Patient: Jenny Bradford             Date of Birth: [birth year]           MRN: [id number]             PCP: Irena Reichmann, DO Referring: Irena Reichmann, DO Visit Date: 08/21/2022 Occupation: @GUAROCC @  Subjective:  Positive ANA and joint pain  History of Present Illness: Jenny Bradford is a 72 y.o. female returns today after her last visit in June 2023.  She continues to have some discomfort in her joints.  She had recent labs for review.  She denies any history of oral ulcers, nasal ulcers, malar rash, photosensitivity, Raynaud's or lymphadenopathy.  She states she has some shortness of breath which has not changed.  She was found to have elevated LFTs and muscle pain.  She decided to come off Zetia.  She will be stopping statins in the future.    Activities of Daily Living:  Patient reports morning stiffness for 30 minutes.   Patient Reports nocturnal pain.  Difficulty dressing/grooming: Denies Difficulty climbing stairs: Reports Difficulty getting out of chair: Reports Difficulty using hands for taps, buttons, cutlery, and/or writing: Reports  Review of Systems  Constitutional:  Positive for fatigue.  HENT:  Negative for mouth sores and mouth dryness.   Eyes:  Negative for dryness.  Respiratory:  Positive for shortness of breath.   Cardiovascular:  Negative for chest pain and palpitations.  Gastrointestinal:  Positive for constipation. Negative for blood in stool and diarrhea.  Endocrine: Negative for increased urination.  Genitourinary:  Negative for involuntary urination.  Musculoskeletal:  Positive for joint pain, gait problem, joint pain, myalgias, muscle weakness, morning stiffness, muscle tenderness and myalgias. Negative for joint swelling.  Skin:  Negative for color change, rash, hair loss and sensitivity to sunlight.  Allergic/Immunologic: Positive for susceptible to infections.  Neurological:  Positive for headaches. Negative for dizziness.   Hematological:  Negative for swollen glands.  Psychiatric/Behavioral:  Positive for sleep disturbance. Negative for depressed mood. The patient is not nervous/anxious.     PMFS History:  Patient Active Problem List   Diagnosis Date Noted   Bilateral hip pain 01/02/2021   Primary osteoarthritis of left knee 01/26/2019   Primary osteoarthritis of right knee 01/26/2019   Palpitations 09/04/2017   Family history of coronary artery disease in father 09/04/2017   Paraesophageal hernia 05/13/2017   Neck pain 01/03/2017   Large hiatal hernia 12/28/2016   Multiple pulmonary nodules determined by computed tomography of lung 12/27/2016   Arthritis of carpometacarpal [facility]) joint of left thumb 05/28/2016   OSA (obstructive sleep apnea) 10/05/2012   HTN (hypertension) 10/05/2012   Hyperlipidemia 10/05/2012   Migraine headache 10/05/2012    Past Medical History:  Diagnosis Date   AAA (abdominal aortic aneurysm) (HCC) 08/2014   scanning every 2 years   Arthritis    oa   Complication of anesthesia    slow to awaken in past   Family history of anesthesia complication    slow to awaken   GERD (gastroesophageal reflux disease)    Grade II diastolic dysfunction 04/2017   Noted on ECHO   H/O hiatal hernia    Headache(784.0)    migraines (rare)   Hemorrhoids    Hyperlipidemia    Hypertension 04/01/11   ECHO-EF>55% NUC STRESS TEST- 05/01/12   Hypothyroidism    Mild aortic valve regurgitation 04/2017   Noted on ECHO   Mitral valve  regurgitation 04/2017   Mild: Noted on ECHO   Pulmonary nodules 2018   scanning every 6 months   Sleep apnea 05/01/12 & 05/29/12   SLEEP STUDY-[facility], NO CPAP USED SINCE MAY 2015 Uses oral device    Family History  Problem Relation Age of Onset   Breast cancer Mother    Ovarian cancer Mother    Rheum arthritis Mother    Heart attack Father    Heart disease Father    Hypertension Brother    Heart disease Brother    Hyperlipidemia  Maternal Grandmother    Hypertension Maternal Grandmother    Polymyalgia rheumatica Paternal Aunt    Hypertension Son    Migraines Daughter    Raynaud syndrome Daughter    Past Surgical History:  Procedure Laterality Date   ANKLE SURGERY Right 1980   CARPOMETACARPEL SUSPENSION PLASTY Right 12/14/2014   Procedure: RIGHT THUMB CARPOMETACARPAL (CMC) ARTHROPLASTY;  Surgeon: Tarry Kos, MD;  Location: [facility];  Service: Orthopedics;  Laterality: Right;   CARPOMETACARPEL SUSPENSION PLASTY Left 03/19/2017   Procedure: Left Thumb Ligament Reconstruction and Tendon Interposition;  Surgeon: Tarry Kos, MD;  Location: [facility];  Service: Orthopedics;  Laterality: Left;   COLONOSCOPY WITH PROPOFOL N/A 12/07/2013   Procedure: COLONOSCOPY WITH PROPOFOL;  Surgeon: Charolett Bumpers, MD;  Location: [facility];  Service: Endoscopy;  Laterality: N/A;   CYSTECTOMY  1975   FOOT SURGERY Right 12/01/2012   FOOT SURGERY Left 12/10/2011   HEMORRHOID SURGERY  2005   HERNIA REPAIR  2019   Miscarriage  1983   TONSILLECTOMY  1964   VAGINAL HYSTERECTOMY     With pelvic floor repair   Social History   Social History Narrative   Not on file   Immunization History  Administered Date(s) Administered   Influenza Whole 11/09/2016   Influenza,inj,quad, With Preservative 12/15/2013   Influenza-Unspecified 11/10/2014, 11/09/2017   Moderna Sars-Covid-2 Vaccination 04/06/2019, 05/04/2019, 01/22/2020, 03/25/2021   Zoster Recombinat (Shingrix) 05/12/2018     Objective: Vital Signs: BP 113/74 (BP Location: Left Arm, Patient Position: Sitting, Cuff Size: Large)   Pulse (!) 56   Resp 16   Ht 5\' 2"  (1.575 m)   Wt 161 lb 12.8 oz (73.4 kg)   BMI 29.59 kg/m    Physical Exam Vitals and nursing note reviewed.  Constitutional:      Appearance: She is well-developed.  HENT:     Head: Normocephalic and atraumatic.  Eyes:     Conjunctiva/sclera: Conjunctivae normal.   Cardiovascular:     Rate and Rhythm: Normal rate and regular rhythm.     Heart sounds: Normal heart sounds.  Pulmonary:     Effort: Pulmonary effort is normal.     Breath sounds: Normal breath sounds.  Abdominal:     General: Bowel sounds are normal.     Palpations: Abdomen is soft.  Musculoskeletal:     Cervical back: Normal range of motion.     Right lower leg: Edema present.     Left lower leg: Edema present.  Lymphadenopathy:     Cervical: No cervical adenopathy.  Skin:    General: Skin is warm and dry.     Capillary Refill: Capillary refill takes less than 2 seconds.  Neurological:     Mental Status: She is alert and oriented to person, place, and time.  Psychiatric:        Behavior: Behavior normal.      Musculoskeletal Exam: She  good range of motion of the cervical spine.  Significant thoracic kyphosis was noted.  She has some discomfort range of motion of her lumbar spine without any point tenderness.  Shoulders, elbow joints, wrist joints, MCPs PIPs and DIPs with good range of motion.  She had bilateral surgical scar from [facility] surgery.  Mild PIP and DIP thickening was noted.  Subluxation of first DIP joint of the right index finger was noted.  Hip joints and knee joints were in good range of motion.  There was no warmth swelling or effusion.  There was no tenderness over ankles or MTPs.  CDAI Exam: CDAI Score: -- Patient Global: --; Provider Global: -- Swollen: --; Tender: -- Joint Exam 08/21/2022   No joint exam has been documented for this visit   There is currently no information documented on the homunculus. Go to the Rheumatology activity and complete the homunculus joint exam.  Investigation: No additional findings.  Imaging: No results found.  Recent Labs: Lab Results  Component Value Date   WBC 9.3 08/02/2021   HGB 14.8 08/02/2021   PLT 290 08/02/2021   NA 135 08/02/2021   K 4.6 08/02/2021   CL 100 08/02/2021   CO2 26 08/02/2021   GLUCOSE 82  08/02/2021   BUN 18 08/02/2021   CREATININE 0.94 08/02/2021   BILITOT 0.5 08/02/2021   ALKPHOS 71 11/23/2018   AST 26 08/02/2021   ALT 71 (H) 08/02/2021   PROT 6.7 08/02/2021   ALBUMIN 4.0 11/23/2018   CALCIUM 9.5 08/02/2021   GFRAA 74 11/23/2018   Aug 01, 2022 CBC WBC 4.0, hemoglobin 13.9, platelets 224, CMP creatinine 0.86, AST 24, ALT 39  August 12, 2022 UA negative, ANA 1: 40 cytoplasmic, 1: 1280 centromere, C3-C4 normal, sed rate 6, dsDNA negative  Speciality Comments: No specialty comments available.  Procedures:  No procedures performed Allergies: Midol [aspirin-cinnamedrine-caffeine], Olmesartan, Talwin [pentazocine], Bisoprolol, and Naproxen   Assessment / Plan:     Visit Diagnoses: Positive ANA (antinuclear antibody) - ANA 1: 640 centromere, 1: 40 speckled, dsDNA negative, C3-C4 normal, ESR 6. -She continues to have positive ANA centromere pattern.  Lab findings were reviewed with the patient.  She has no history of Raynauds, sclerodactyly, reflux, oral ulcers, nasal ulcers, malar rash, photosensitivity.  She denies any history of palpitations or shortness of breath.  I advised her to have repeat labs in 1 year.  If she develops any new symptoms she should notify me.  Plan: Anti-DNA antibody, double-stranded, C3 and C4, ANA, Sedimentation rate  Shortness of breath -she states her shortness of breath has been stable only with exertion which she relates to deconditioning.  She states she has been advised not to return for pulmonary follow-up visits as her CT scan has been stable.  CT on 09/07/2018 for yearly surveillance of a pulmonary nodule. She is followed by Dr. Sherene Sires.  Elevated LFTs-her LFTs were mildly elevated with ALT 39 in May 2024.  She is currently off [place] and will have repeat labs.    Arthritis of carpometacarpal (CMC) joint of left thumb - s/p bilateral CMC surgery.  Patient reports decreased grip strength in her hands.  A handout on hand exercises was  given.  Trochanteric bursitis of both hips-she noticed improvement after physical therapy.  Need for regular exercise and stretching was emphasized.  Primary osteoarthritis of both knees -she continues to have pain and discomfort in her knee joints.  I reviewed her x-rays from January 02, 2021 today.  Her x-rays  showed right knee severe medial compartment narrowing, left knee moderate medial compartment narrowing.  She has been using a knee brace.  A handout on lower extremity muscle strengthening exercises were discussed.  Patient states she had cortisone injection which does not last long.  She had very painful viscosupplement injection from which she did not have any relief.  DDD (degenerative disc disease), cervical-she has good range of motion of the cervical spine without discomfort.  Essential hypertension-blood pressure was normal at 113/74.  Other medical problems are listed as follows:  History of hyperlipidemia  Hx of migraines  Paraesophageal hernia  OSA (obstructive sleep apnea)  Recurrent UTI  Family history of rheumatoid arthritis  Family history of coronary artery disease in father  Family history of Raynaud's phenomenon  Family history of polymyalgia rheumatica  Orders: Orders Placed This Encounter  Procedures   Anti-DNA antibody, double-stranded   C3 and C4   ANA   Sedimentation rate   No orders of the defined types were placed in this encounter.    Follow-Up Instructions: Return in about 1 year (around 08/21/2023) for Osteoarthritis, +ANA.   Pollyann Savoy, MD  Note - This record has been created using Animal nutritionist.  Chart creation errors have been sought, but may not always  have been located. Such creation errors do not reflect on  the standard of medical care.

## 2022-08-12 ENCOUNTER — Other Ambulatory Visit: Payer: Self-pay | Admitting: *Deleted

## 2022-08-12 DIAGNOSIS — R768 Other specified abnormal immunological findings in serum: Secondary | ICD-10-CM | POA: Diagnosis not present

## 2022-08-12 LAB — SEDIMENTATION RATE: Sed Rate: 6 mm/h (ref 0–30)

## 2022-08-13 LAB — URINALYSIS, ROUTINE W REFLEX MICROSCOPIC
Bilirubin Urine: NEGATIVE
Hgb urine dipstick: NEGATIVE
Leukocytes,Ua: NEGATIVE
Nitrite: NEGATIVE
Protein, ur: NEGATIVE
Specific Gravity, Urine: 1.012 (ref 1.001–1.035)

## 2022-08-13 LAB — C3 AND C4: C3 Complement: 139 mg/dL (ref 83–193)

## 2022-08-14 DIAGNOSIS — M159 Polyosteoarthritis, unspecified: Secondary | ICD-10-CM | POA: Diagnosis not present

## 2022-08-14 DIAGNOSIS — R7309 Other abnormal glucose: Secondary | ICD-10-CM | POA: Diagnosis not present

## 2022-08-14 DIAGNOSIS — E559 Vitamin D deficiency, unspecified: Secondary | ICD-10-CM | POA: Diagnosis not present

## 2022-08-14 DIAGNOSIS — E785 Hyperlipidemia, unspecified: Secondary | ICD-10-CM | POA: Diagnosis not present

## 2022-08-14 DIAGNOSIS — Z79899 Other long term (current) drug therapy: Secondary | ICD-10-CM | POA: Diagnosis not present

## 2022-08-14 DIAGNOSIS — I10 Essential (primary) hypertension: Secondary | ICD-10-CM | POA: Diagnosis not present

## 2022-08-14 DIAGNOSIS — E039 Hypothyroidism, unspecified: Secondary | ICD-10-CM | POA: Diagnosis not present

## 2022-08-14 DIAGNOSIS — K5900 Constipation, unspecified: Secondary | ICD-10-CM | POA: Diagnosis not present

## 2022-08-14 DIAGNOSIS — M8580 Other specified disorders of bone density and structure, unspecified site: Secondary | ICD-10-CM | POA: Diagnosis not present

## 2022-08-14 LAB — URINALYSIS, ROUTINE W REFLEX MICROSCOPIC
Glucose, UA: NEGATIVE
Ketones, ur: NEGATIVE
pH: 5.5 (ref 5.0–8.0)

## 2022-08-14 LAB — ANTI-NUCLEAR AB-TITER (ANA TITER)
ANA TITER: 1:1280 {titer} — ABNORMAL HIGH
ANA Titer 1: 1:40 {titer} — ABNORMAL HIGH

## 2022-08-14 LAB — ANA: Anti Nuclear Antibody (ANA): POSITIVE — AB

## 2022-08-14 LAB — ANTI-DNA ANTIBODY, DOUBLE-STRANDED: ds DNA Ab: <1 IU/mL

## 2022-08-14 LAB — C3 AND C4: C4 Complement: 24 mg/dL (ref 15–57)

## 2022-08-14 NOTE — Progress Notes (Signed)
ANA is 1: 1280 centromere pattern.  Sed rate and complements normal, double-stranded DNA negative, UA negative.  Labs do not indicate an active autoimmune disease.  No change in treatment advised.

## 2022-08-21 ENCOUNTER — Encounter: Payer: Self-pay | Admitting: Rheumatology

## 2022-08-21 ENCOUNTER — Other Ambulatory Visit: Payer: Self-pay | Admitting: *Deleted

## 2022-08-21 ENCOUNTER — Ambulatory Visit: Payer: Medicare Other | Attending: Rheumatology | Admitting: Rheumatology

## 2022-08-21 VITALS — BP 113/74 | HR 56 | Resp 16 | Ht 62.0 in | Wt 161.8 lb

## 2022-08-21 DIAGNOSIS — Z8669 Personal history of other diseases of the nervous system and sense organs: Secondary | ICD-10-CM | POA: Diagnosis not present

## 2022-08-21 DIAGNOSIS — Z8261 Family history of arthritis: Secondary | ICD-10-CM

## 2022-08-21 DIAGNOSIS — R0602 Shortness of breath: Secondary | ICD-10-CM

## 2022-08-21 DIAGNOSIS — M7061 Trochanteric bursitis, right hip: Secondary | ICD-10-CM

## 2022-08-21 DIAGNOSIS — Z8249 Family history of ischemic heart disease and other diseases of the circulatory system: Secondary | ICD-10-CM

## 2022-08-21 DIAGNOSIS — M170 Bilateral primary osteoarthritis of knee: Secondary | ICD-10-CM

## 2022-08-21 DIAGNOSIS — R768 Other specified abnormal immunological findings in serum: Secondary | ICD-10-CM | POA: Diagnosis not present

## 2022-08-21 DIAGNOSIS — M7062 Trochanteric bursitis, left hip: Secondary | ICD-10-CM | POA: Insufficient documentation

## 2022-08-21 DIAGNOSIS — M5030 Other cervical disc degeneration, unspecified cervical region: Secondary | ICD-10-CM

## 2022-08-21 DIAGNOSIS — R7989 Other specified abnormal findings of blood chemistry: Secondary | ICD-10-CM

## 2022-08-21 DIAGNOSIS — M1812 Unilateral primary osteoarthritis of first carpometacarpal joint, left hand: Secondary | ICD-10-CM

## 2022-08-21 DIAGNOSIS — Z8269 Family history of other diseases of the musculoskeletal system and connective tissue: Secondary | ICD-10-CM | POA: Diagnosis not present

## 2022-08-21 DIAGNOSIS — G4733 Obstructive sleep apnea (adult) (pediatric): Secondary | ICD-10-CM

## 2022-08-21 DIAGNOSIS — K449 Diaphragmatic hernia without obstruction or gangrene: Secondary | ICD-10-CM

## 2022-08-21 DIAGNOSIS — Z8639 Personal history of other endocrine, nutritional and metabolic disease: Secondary | ICD-10-CM

## 2022-08-21 DIAGNOSIS — R7689 Other specified abnormal immunological findings in serum: Secondary | ICD-10-CM

## 2022-08-21 DIAGNOSIS — I10 Essential (primary) hypertension: Secondary | ICD-10-CM

## 2022-08-21 DIAGNOSIS — N390 Urinary tract infection, site not specified: Secondary | ICD-10-CM

## 2022-08-21 DIAGNOSIS — I714 Abdominal aortic aneurysm, without rupture, unspecified: Secondary | ICD-10-CM

## 2022-08-21 NOTE — Patient Instructions (Signed)
Hand Exercises Hand exercises can be helpful for almost anyone. They can strengthen your hands and improve flexibility and movement. The exercises can also increase blood flow to the hands. These results can make your work and daily tasks easier for you. Hand exercises can be especially helpful for people who have joint pain from arthritis or nerve damage from using their hands over and over. These exercises can also help people who injure a hand. Exercises Most of these hand exercises are gentle stretching and motion exercises. It is usually safe to do them often throughout the day. Warming up your hands before exercise may help reduce stiffness. You can do this with gentle massage or by placing your hands in warm water for 10-15 minutes. It is normal to feel some stretching, pulling, tightness, or mild discomfort when you begin new exercises. In time, this will improve. Remember to always be careful and stop right away if you feel sudden, very bad pain or your pain gets worse. You want to get better and be safe. Ask your health care provider which exercises are safe for you. Do exercises exactly as told by your provider and adjust them as told. Do not begin these exercises until told by your provider. Knuckle bend or "claw" fist  Stand or sit with your arm, hand, and all five fingers pointed straight up. Make sure to keep your wrist straight. Gently bend your fingers down toward your palm until the tips of your fingers are touching your palm. Keep your big knuckle straight and only bend the small knuckles in your fingers. Hold this position for 10 seconds. Straighten your fingers back to your starting position. Repeat this exercise 5-10 times with each hand. Full finger fist  Stand or sit with your arm, hand, and all five fingers pointed straight up. Make sure to keep your wrist straight. Gently bend your fingers into your palm until the tips of your fingers are touching the middle of your  palm. Hold this position for 10 seconds. Extend your fingers back to your starting position, stretching every joint fully. Repeat this exercise 5-10 times with each hand. Straight fist  Stand or sit with your arm, hand, and all five fingers pointed straight up. Make sure to keep your wrist straight. Gently bend your fingers at the big knuckle, where your fingers meet your hand, and at the middle knuckle. Keep the knuckle at the tips of your fingers straight and try to touch the bottom of your palm. Hold this position for 10 seconds. Extend your fingers back to your starting position, stretching every joint fully. Repeat this exercise 5-10 times with each hand. Tabletop  Stand or sit with your arm, hand, and all five fingers pointed straight up. Make sure to keep your wrist straight. Gently bend your fingers at the big knuckle, where your fingers meet your hand, as far down as you can. Keep the small knuckles in your fingers straight. Think of forming a tabletop with your fingers. Hold this position for 10 seconds. Extend your fingers back to your starting position, stretching every joint fully. Repeat this exercise 5-10 times with each hand. Finger spread  Place your hand flat on a table with your palm facing down. Make sure your wrist stays straight. Spread your fingers and thumb apart from each other as far as you can until you feel a gentle stretch. Hold this position for 10 seconds. Bring your fingers and thumb tight together again. Hold this position for 10 seconds. Repeat   this exercise 5-10 times with each hand. Making circles  Stand or sit with your arm, hand, and all five fingers pointed straight up. Make sure to keep your wrist straight. Make a circle by touching the tip of your thumb to the tip of your index finger. Hold for 10 seconds. Then open your hand wide. Repeat this motion with your thumb and each of your fingers. Repeat this exercise 5-10 times with each hand. Thumb  motion  Sit with your forearm resting on a table and your wrist straight. Your thumb should be facing up toward the ceiling. Keep your fingers relaxed as you move your thumb. Lift your thumb up as high as you can toward the ceiling. Hold for 10 seconds. Bend your thumb across your palm as far as you can, reaching the tip of your thumb for the small finger (pinkie) side of your palm. Hold for 10 seconds. Repeat this exercise 5-10 times with each hand. Grip strengthening  Hold a stress ball or other soft ball in the middle of your hand. Slowly increase the pressure, squeezing the ball as much as you can without causing pain. Think of bringing the tips of your fingers into the middle of your palm. All of your finger joints should bend when doing this exercise. Hold your squeeze for 10 seconds, then relax. Repeat this exercise 5-10 times with each hand. Contact a health care provider if: Your hand pain or discomfort gets much worse when you do an exercise. Your hand pain or discomfort does not improve within 2 hours after you exercise. If you have either of these problems, stop doing these exercises right away. Do not do them again unless your provider says that you can. Get help right away if: You develop sudden, severe hand pain or swelling. If this happens, stop doing these exercises right away. Do not do them again unless your provider says that you can. This information is not intended to replace advice given to you by your health care provider. Make sure you discuss any questions you have with your health care provider. Document Revised: 03/12/2022 Document Reviewed: 03/12/2022 Elsevier Patient Education  2024 [employer] Exercises for Chronic Knee Pain Chronic knee pain is pain that lasts longer than 3 months. For most people with chronic knee pain, exercise and weight loss is an important part of treatment. Your health care provider may want you to focus on: Making the muscles that  support your knee stronger. This can take pressure off your knee and reduce pain. Preventing knee stiffness. How far you can move your knee, keeping it there or making it farther. Losing weight (if this applies) to take pressure off your knee, lower your risk for injury, and make it easier for you to exercise. Your provider will help you make an exercise program that fits your needs and physical abilities. Below are simple, low-impact exercises you can do at home. Ask your provider or physical therapist how often you should do your exercise program and how many times to repeat each exercise. General safety tips  Get your provider's approval before doing any exercises. Start slowly and stop any time you feel pain. Do not exercise if your knee pain is flaring up. Warm up first. Stretching a cold muscle can cause an injury. Do 5-10 minutes of easy movement or light stretching before beginning your exercises. Do 5-10 minutes of low-impact activity (like walking or cycling) before starting strengthening exercises. Contact your provider any time you have pain during   or after exercising. Exercise can cause discomfort but should not be painful. It is normal to be a little stiff or sore after exercising. Stretching and range-of-motion exercises Front thigh stretch  Stand up straight and support your body by holding on to a chair or resting one hand on a wall. With your legs straight and close together, bend one knee to lift your heel up toward your butt. Using one hand for support, grab your ankle with your free hand. Pull your foot up closer toward your butt to feel the stretch in front of your thigh. Hold the stretch for 30 seconds. Repeat __________ times. Complete this exercise __________ times a day. Back thigh stretch  Sit on the floor with your back straight and your legs out straight in front of you. Place the palms of your hands on the floor and slide them toward your feet as you bend at the  hip. Try to touch your nose to your knees and feel the stretch in the back of your thighs. Hold for 30 seconds. Repeat __________ times. Complete this exercise __________ times a day. Calf stretch  Stand facing a wall. Place the palms of your hands flat against the wall, arms extended, and lean slightly against the wall. Get into a lunge position with one leg bent at the knee and the other leg stretched out straight behind you. Keep both feet facing the wall and increase the bend in your knee while keeping the heel of the other leg flat on the ground. You should feel the stretch in your calf. Hold for 30 seconds. Repeat __________ times. Complete this exercise __________ times a day. Strengthening exercises Straight leg lift  Lie on your back with one knee bent and the other leg out straight. Slowly lift the straight leg without bending the knee. Lift until your foot is about 12 inches (30 cm) off the floor. Hold for 3-5 seconds and slowly lower your leg. Repeat __________ times. Complete this exercise __________ times a day. Single leg dip  Stand between two chairs and put both hands on the backs of the chairs for support. Extend one leg out straight with your body weight resting on the heel of the standing leg. Slowly bend your standing knee to dip your body to the level that is comfortable for you. Hold for 3-5 seconds. Repeat __________ times. Complete this exercise __________ times a day. Hamstring curls  Stand straight, knees close together, facing the back of a chair. Hold on to the back of a chair with both hands. Keep one leg straight. Bend the other knee while bringing the heel up toward the butt until the knee is bent at a 90-degree angle (right angle). Hold for 3-5 seconds. Repeat __________ times. Complete this exercise __________ times a day. Wall squat  Stand straight with your back, hips, and head against a wall. Step forward one foot at a time with your back  still against the wall. Your feet should be 2 feet (61 cm) from the wall at shoulder width. Keeping your back, hips, and head against the wall, slide down the wall to as close to a sitting position as you can get. Hold for 5-10 seconds, then slowly slide back up. Repeat __________ times. Complete this exercise __________ times a day. Step-ups  Stand in front of a sturdy platform or stool that is about 6 inches (15 cm) high. Slowly step up with your left / right foot, keeping your knee in line with your hip   and foot. Do not let your knee bend so far that you cannot see your toes. Hold on to a chair for balance, but do not use it for support. Slowly unlock your knee and lower yourself to the starting position. Repeat __________ times. Complete this exercise __________ times a day. Contact a health care provider if: Your exercises cause pain. Your pain is worse after you exercise. Your pain prevents you from doing your exercises. This information is not intended to replace advice given to you by your health care provider. Make sure you discuss any questions you have with your health care provider. Document Revised: 03/12/2022 Document Reviewed: 03/12/2022 Elsevier Patient Education  2024 [employer] Inc[employer]  

## 2022-08-27 ENCOUNTER — Ambulatory Visit: Payer: Medicare Other

## 2022-09-02 ENCOUNTER — Ambulatory Visit (HOSPITAL_COMMUNITY): Payer: Medicare Other

## 2022-09-02 ENCOUNTER — Ambulatory Visit: Payer: Medicare Other

## 2022-09-18 ENCOUNTER — Ambulatory Visit
Admission: RE | Admit: 2022-09-18 | Discharge: 2022-09-18 | Disposition: A | Discharge status: Home / Self Care | Payer: Medicare Other | Source: Ambulatory Visit | Attending: Obstetrics & Gynecology | Admitting: Obstetrics & Gynecology

## 2022-09-18 DIAGNOSIS — Z1231 Encounter for screening mammogram for malignant neoplasm of breast: Secondary | ICD-10-CM | POA: Diagnosis not present

## 2022-10-03 NOTE — Progress Notes (Signed)
HISTORY AND PHYSICAL     CC:  follow up. Requesting Provider:  Irena Reichmann, DO  HPI: This is a 72 y.o. female who is here today for follow up for AAA.  This was initially detected on an ultrasound. Maximum diameter was 2.3 cm. She does have a significant family history of aneurysms in her father and uncle.   Pt was last seen 08/30/2020 and at that time, she was not having any abdominal pain or back pain.  The aneurysm was saccular and  2.5cm.  she was scheduled for 2 year follow up.  The pt returns today for follow up.  She denies any new abdominal or back pain.  She states her blood pressure is well controlled.  She denies any claudication, rest pain or non healing wounds.   The pt is on a statin for cholesterol management.    The pt is not on an aspirin.    Other AC:  none The pt is on ARB, diuretic for hypertension.  The pt is not on medication diabetes. Tobacco hx:  never  Pt does have family hx of AAA with her father and uncle. She did work as a Teacher, early years/pre.   Past Medical History:  Diagnosis Date   AAA (abdominal aortic aneurysm) (HCC) 08/2014   scanning every 2 years   Arthritis    oa   Complication of anesthesia    slow to awaken in past   Family history of anesthesia complication    slow to awaken   GERD (gastroesophageal reflux disease)    Grade II diastolic dysfunction 04/2017   Noted on ECHO   H/O hiatal hernia    Headache(784.0)    migraines (rare)   Hemorrhoids    Hyperlipidemia    Hypertension 04/01/11   ECHO-EF>55% NUC STRESS TEST- 05/01/12   Hypothyroidism    Mild aortic valve regurgitation 04/2017   Noted on ECHO   Mitral valve regurgitation 04/2017   Mild: Noted on ECHO   Pulmonary nodules 2018   scanning every 6 months   Sleep apnea 05/01/12 & 05/29/12   SLEEP STUDY-[facility], NO CPAP USED SINCE MAY 2015 Uses oral device    Past Surgical History:  Procedure Laterality Date   ANKLE SURGERY Right 1980   CARPOMETACARPEL  SUSPENSION PLASTY Right 12/14/2014   Procedure: RIGHT THUMB CARPOMETACARPAL (CMC) ARTHROPLASTY;  Surgeon: Tarry Kos, MD;  Location: [facility];  Service: Orthopedics;  Laterality: Right;   CARPOMETACARPEL SUSPENSION PLASTY Left 03/19/2017   Procedure: Left Thumb Ligament Reconstruction and Tendon Interposition;  Surgeon: Tarry Kos, MD;  Location: [facility];  Service: Orthopedics;  Laterality: Left;   COLONOSCOPY WITH PROPOFOL N/A 12/07/2013   Procedure: COLONOSCOPY WITH PROPOFOL;  Surgeon: Charolett Bumpers, MD;  Location: [facility];  Service: Endoscopy;  Laterality: N/A;   CYSTECTOMY  1975   FOOT SURGERY Right 12/01/2012   FOOT SURGERY Left 12/10/2011   HEMORRHOID SURGERY  2005   HERNIA REPAIR  2019   Miscarriage  1983   TONSILLECTOMY  1964   VAGINAL HYSTERECTOMY     With pelvic floor repair    Allergies  Allergen Reactions   Midol [Aspirin-Cinnamedrine-Caffeine] Other (See Comments)    DIZZINESS   Olmesartan Nausea Only   Talwin [Pentazocine] Other (See Comments)    EXTREME DROWSINESS   Bisoprolol Other (See Comments)    weakness   Naproxen Rash   Current medications:  please see list on chart. Unable to populate in  note.      Family History  Problem Relation Age of Onset   Breast cancer Mother    Ovarian cancer Mother    Rheum arthritis Mother    Heart attack Father    Heart disease Father    Hypertension Brother    Heart disease Brother    Hyperlipidemia Maternal Grandmother    Hypertension Maternal Grandmother    Polymyalgia rheumatica Paternal Aunt    Hypertension Son    Migraines Daughter    Raynaud syndrome Daughter     Social History   Socioeconomic History   Marital status: Married    Spouse name: Not on file   Number of children: Not on file   Years of education: Not on file   Highest education level: Not on file  Occupational History   Not on file  Tobacco Use   Smoking status: Never    Passive exposure:  Never   Smokeless tobacco: Never  Vaping Use   Vaping status: Never Used  Substance and Sexual Activity   Alcohol use: No    Alcohol/week: 0.0 standard drinks of alcohol   Drug use: No   Sexual activity: Not on file  Other Topics Concern   Not on file  Social History Narrative   Not on file   Social Determinants of Health   Financial Resource Strain: Not on file  Food Insecurity: Not on file  Transportation Needs: Not on file  Physical Activity: Not on file  Stress: Not on file  Social Connections: Not on file  Intimate Partner Violence: Not on file     REVIEW OF SYSTEMS:   [X]  denotes positive finding, [ ]  denotes negative finding Cardiac  Comments:  Chest pain or chest pressure:    Shortness of breath upon exertion:    Short of breath when lying flat:    Irregular heart rhythm:        Vascular    Pain in calf, thigh, or hip brought on by ambulation:    Pain in feet at night that wakes you up from your sleep:     Blood clot in your veins:    Leg swelling:         Pulmonary    Oxygen at home:    Productive cough:     Wheezing:         Neurologic    Sudden weakness in arms or legs:     Sudden numbness in arms or legs:     Sudden onset of difficulty speaking or slurred speech:    Temporary loss of vision in one eye:     Problems with dizziness:         Gastrointestinal    Blood in stool:     Vomited blood:         Genitourinary    Burning when urinating:     Blood in urine:        Psychiatric    Major depression:         Hematologic    Bleeding problems:    Problems with blood clotting too easily:        Skin    Rashes or ulcers:        Constitutional    Fever or chills:      PHYSICAL EXAMINATION:  Today's Vitals   10/07/22 0956  BP: 123/78  Pulse: (!) 59  Resp: 16  Temp: (!) 97.3 F (36.3 C)  TempSrc: Temporal  SpO2: 97%  Weight:  161 lb 4.8 oz (73.2 kg)  Height: 5\' 2"  (1.575 m)   Body mass index is 29.5 kg/m.   General:  WDWN  in NAD; vital signs documented above Gait: Not observed HENT: WNL, normocephalic Pulmonary: normal non-labored breathing  Cardiac: regular HR, without carotid bruits Abdomen: soft, NT; aortic pulse is palpable Skin: without rashes Vascular Exam/Pulses:  Right Left  Radial 2+ (normal) 2+ (normal)  Femoral 2+ (normal) 2+ (normal)  Popliteal Unable to palpate Unable to palpate  DP 2+ (normal) 2+ (normal)   Extremities: without ischemic changes, without Gangrene , without cellulitis; without open wounds Musculoskeletal: no muscle wasting or atrophy  Neurologic: A&O X 3;  No focal weakness or paresthesias are detected Psychiatric:  The pt has Normal affect.   Non-Invasive Vascular Imaging:   AAA Arterial duplex on 10/07/2022: Abdominal Aorta Findings:  +-----------+-------+----------+----------+--------+--------+--------+  Location  AP (cm)Trans (cm)PSV (cm/s)WaveformThrombusComments  +-----------+-------+----------+----------+--------+--------+--------+  Proximal  2.03   2.13      83                                  +-----------+-------+----------+----------+--------+--------+--------+  Mid       2.39   2.32      77                                  +-----------+-------+----------+----------+--------+--------+--------+  Distal    1.69   1.72      71                                  +-----------+-------+----------+----------+--------+--------+--------+  RT CIA Prox1.0    1.0       77                                  +-----------+-------+----------+----------+--------+--------+--------+  RT CIA Mid 0.7    0.7       131                                 +-----------+-------+----------+----------+--------+--------+--------+   Summary:  Abdominal Aorta: No evidence of an abdominal aortic aneurysm was  visualized. The largest aortic measurement is 2.4 cm. The largest aortic  diameter remains essentially unchanged compared to prior exam.  Previous  diameter measurement was 2.5 cm obtained on   Previous AAA arterial duplex on 08/30/2020: Abdominal Aorta Findings:  +-----------+-------+----------+----------+--------+--------+--------+  Location  AP (cm)Trans (cm)PSV (cm/s)WaveformThrombusComments  +-----------+-------+----------+----------+--------+--------+--------+  Proximal  1.76   1.71      94                                  +-----------+-------+----------+----------+--------+--------+--------+  Mid       2.36   2.53      98                        saccular  +-----------+-------+----------+----------+--------+--------+--------+  Distal    1.86   2.11  saccular  +-----------+-------+----------+----------+--------+--------+--------+  RT CIA Prox1.1    1.1       117                                 +-----------+-------+----------+----------+--------+--------+--------+  LT CIA Prox1.1    1.1       128                                 +-----------+-------+----------+----------+--------+--------+--------+    ASSESSMENT/PLAN:: 72 y.o. female here for follow up for AAA  -pt AAA essentially unchanged at 2.4cm today.  She does not have any abdominal or back pain.  This has remained stable for several years.  Continue good BP control.  -pt will f/u in 2 years with AAA duplex and BLE arterial duplex to check popliteal arteries.  She will call sooner if any issues before then. -discussed if she develops sudden severe abdominal pain, she should call 911.  She expressed understanding. -continue statin/Zetia   Doreatha Massed, [facility] [phone]  Clinic MD:   Lenell Antu

## 2022-10-07 ENCOUNTER — Ambulatory Visit (INDEPENDENT_AMBULATORY_CARE_PROVIDER_SITE_OTHER): Payer: Medicare Other | Admitting: Physician Assistant

## 2022-10-07 ENCOUNTER — Encounter: Payer: Self-pay | Admitting: Physician Assistant

## 2022-10-07 ENCOUNTER — Ambulatory Visit (HOSPITAL_COMMUNITY)
Admission: RE | Admit: 2022-10-07 | Discharge: 2022-10-07 | Disposition: A | Discharge status: Home / Self Care | Payer: Medicare Other | Source: Ambulatory Visit | Attending: Physician Assistant | Admitting: Physician Assistant

## 2022-10-07 VITALS — BP 123/78 | HR 59 | Temp 97.3°F | Resp 16 | Ht 62.0 in | Wt 161.3 lb

## 2022-10-07 DIAGNOSIS — I714 Abdominal aortic aneurysm, without rupture, unspecified: Secondary | ICD-10-CM | POA: Diagnosis not present

## 2022-10-27 ENCOUNTER — Encounter: Payer: Self-pay | Admitting: Cardiovascular Disease

## 2022-10-28 ENCOUNTER — Ambulatory Visit: Payer: Medicare Other | Admitting: Cardiovascular Disease

## 2022-11-14 DIAGNOSIS — K5900 Constipation, unspecified: Secondary | ICD-10-CM | POA: Diagnosis not present

## 2022-11-22 DIAGNOSIS — Z23 Encounter for immunization: Secondary | ICD-10-CM | POA: Diagnosis not present

## 2022-12-05 DIAGNOSIS — K5900 Constipation, unspecified: Secondary | ICD-10-CM | POA: Diagnosis not present

## 2022-12-05 DIAGNOSIS — K649 Unspecified hemorrhoids: Secondary | ICD-10-CM | POA: Diagnosis not present

## 2022-12-05 DIAGNOSIS — K5730 Diverticulosis of large intestine without perforation or abscess without bleeding: Secondary | ICD-10-CM | POA: Diagnosis not present

## 2023-01-03 ENCOUNTER — Other Ambulatory Visit: Payer: Self-pay | Admitting: Cardiovascular Disease

## 2023-01-21 ENCOUNTER — Encounter: Payer: Self-pay | Admitting: Cardiovascular Disease

## 2023-01-21 ENCOUNTER — Ambulatory Visit: Payer: Medicare Other | Attending: Cardiovascular Disease | Admitting: Cardiovascular Disease

## 2023-01-21 VITALS — BP 142/76 | HR 59 | Ht 62.0 in | Wt 164.4 lb

## 2023-01-21 DIAGNOSIS — Z8249 Family history of ischemic heart disease and other diseases of the circulatory system: Secondary | ICD-10-CM

## 2023-01-21 DIAGNOSIS — G4733 Obstructive sleep apnea (adult) (pediatric): Secondary | ICD-10-CM | POA: Diagnosis not present

## 2023-01-21 DIAGNOSIS — E039 Hypothyroidism, unspecified: Secondary | ICD-10-CM | POA: Diagnosis not present

## 2023-01-21 DIAGNOSIS — K449 Diaphragmatic hernia without obstruction or gangrene: Secondary | ICD-10-CM

## 2023-01-21 DIAGNOSIS — E7800 Pure hypercholesterolemia, unspecified: Secondary | ICD-10-CM | POA: Diagnosis not present

## 2023-01-21 DIAGNOSIS — I5189 Other ill-defined heart diseases: Secondary | ICD-10-CM | POA: Diagnosis not present

## 2023-01-21 DIAGNOSIS — I10 Essential (primary) hypertension: Secondary | ICD-10-CM

## 2023-01-21 NOTE — Progress Notes (Signed)
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  I had cared for her father Jenny Bradford for many years who ultimately died on Jan 24, 2014 at [facility].  She established cardiology care with me in February 2019.  I last saw her in February 2022. She presents for an 9 month follow-up evaluation.   Ms. Jenny Bradford has a history of hypertension for many years.  Most recently, she has been on losartan 75 mg daily in addition to HCTZ 25 mg.  She also has a history of hypothyroidism and has been on levothyroxine at 50 g.  There is a history of hyperlipidemia and she has been on pravastatin 40 mg.  Her father had a history of significant abdominal aortic aneurysm.  She underwent abdominal aortic aneurysm screening and there was no significant aortic dilatation and instructed with a maximum aortic dimension at 2.3 cm.  She has a history of obstructive sleep apnea which was diagnosed in 2014 with an AHI of 14 per hour and RDI of 16.5 per hour.  She had significant oxygen desaturation to a nadir of 81% with rems sleep and had loud snoring.  Apparently should use CPAP for a short while but ultimately became intolerant was ultimately transitioned to an oral appliance for which she is followed by Dr. Althea Bradford.   When I saw her in February 2019 she had begun to notice that despite taking medication for her blood pressure, her blood pressure elevated.  She was walking 4 days per week for 30 minutes at a time.  She denied any exertionally precipitation of chest pain.  She was unaware of any significant valvular abnormality, although her mother has aortic stenosis.  She also has a very large hiatal hernia and has seen Dr. Dulce Bradford and was told that in the future she may require  surgery.  She underwent surgery for paraesophageal hernia repair with a Nissen fundoplication in March 2019.  She also has had progressive difficulty with osteoarthritis involving her neck, back, hands, feet, and feet.  He has not been able to exercise as regularly.  She is no longer working since she is unable to stand up the entire day.  She now sees Jenny Bradford for primary care.  She has been caring for her husband who is permanently disabled since age 24.    When I  saw her on December 08, 2017 as result of the generic impurities I had recommended she change from losartan 100 mg daily to olmesartan 40 mg.  Her lipid studies were not optimal on pravastatin and I suggested discontinuance of this and in its place started atorvastatin 40 mg.  An echo Doppler study in February 2019 showed an EF of 60 to 65% with grade 2 diastolic dysfunction, mild AR and mild MR.  An event monitor had shown sinus rhythm with low average heart rate at 50 and high average heart rate at 70 bpm.  She had several atrial couplets and one atrial triplet.  There were no episodes of atrial fibrillation.  There was no ventricular ectopy or pauses.  I saw her in March 2020 after she had called the office and wished to be seen sooner than her scheduled appointment due  to progressive fatigue and particularly shortness of breath particularly while climbing steps.  She had been self adjusting some of her medications and was no longer on olmesartan but has been on valsartan 160 mg.  However she felt better when she cuts this in half and had been taking 80 mg twice a day.  Denied any chest pain and was walking 1-1 and half miles per day.  She was only using CPAP intermittently and had adamantly using a customized oral appliance.   She was evaluated in a telemedicine visit in June 2020.  At that time she was  on candesartan for ARB therapy.  Her dose was further titrated to 60 mg in the morning and 8 mg in the evening.  She was on  atorvastatin 40 mg on Monday Wednesday and Friday for hyperlipidemia.  She was using an oral appliance for obstructive sleep apnea from Dr. Althea Bradford.  An echo Doppler study from August 13, 2018 showed normal systolic function with EF at 55 to 60%.  There was mild aortic sclerosis with mild AR.  The aortic root and ascending aorta and aortic arch were normal in dimensions.  I saw her on December 07, 2018 which time she was complaining of some muscle aching.  As result she stopped taking atorvastatin several weeks prior to that evaluation.  Previously she felt she tolerated pravastatin better.  She had noticed fatigue and at times low blood pressures.  She will be undergoing a hysterectomy in late January.  I reviewed laboratory from November 23, 2018.  BUN 13 creatinine 0.94.  LFTs normal.  Stable CBC.  Compared to March 2020, cholesterol increased from 140 to162.  LDL increased from 57 while on atorvastatin daily to 84 on her reduced dose.  During that evaluation, recommended institution of Zetia 10 mg.  2 weeks thereafter I recommended resumption of pravastatin which she had tolerated better than atorvastatin.  I also checked a CPK and erythrocyte sedimentation rate which were normal arguing against myopathy or rhabdomyolysis.  She was using her oral appliance for obstructive sleep apnea.  I saw her in December 2020 and over the several months prior to that evaluation she denied any episodes of chest tightness or pressure.  She was contemplating whether or not to go through her elective neurectomy and pelvic floor reconstruction January with the spike in COVID-19 this is to be done by a urogynecologist at [facility].  She admits to fatigue.  She has not had recent laboratory on Zetia and and pravastatin which she has been taking 20 mg.  She continued to be on candesartan 16 mg, spironolactone 12.5 mg for hypertension and  levothyroxine 50 mcg for hypothyroidism.  During that evaluation she continued to  have some fatigue but admitted that she was not consistently using her oral appliance.  I saw her in June 2021 and since her prior evaluation she was found to have positive ANA and was seen by Dr. Titus Dubin.  She was started on aspirin therapy and subsequent laboratory showed improvement of inflammatory markers.  Subsequently she felt better.  She was never started on immunosuppressive therapy.  She has been wearing a knee brace and is followed by Dr. Roda Shutters of orthopedics.  Most recently she did not tolerate a trial of atorvastatin for more aggressive lipid-lowering and resume taking pravastatin 20 mg in addition to Zetia for hyperlipidemia.  Her grandchild was born 4 weeks ago and she is now scheduled for her elective hysterectomy to be done in August  2021.  She was seen by me on April 17, 2020 and since her prior evaluation she continued to do well.   She underwent her urogynecologic surgery October 28, 2019 and tolerated this well.  She has been able to tolerate Zetia and Pravachol statin and lipid studies in October 2021 were markedly improved with a total cholesterol of 130, LDL of 51, triglycerides at 98 and HDL 59.  She did have some issues with reading where she would sense a gap and miss words.  She was evaluated by her ophthalmologist who felt her vision is stable.  She will be undergoing reassessment of ANA at follow-up with Dr. Titus Dubin.  I saw her on January 25, 2021.  Since her prior evaluation, she developed COVID in August 2022 and had fever fatigue sore throat and cough for approximately 1 month.  Subsequently, she has noticed some right leg swelling.  She admits to shortness of breath which seems to occur with decreased activity from previously.  She admits that she has not been exercising regularly.  She also experiences pain in her right foot and has been diagnosed with increased arthritis and bone spurs.  She has been taking spironolactone 25 mg daily, she is on chronic cephalexin for  prevention of recurrent UTIs.  She continues to be on Zetia and pravastatin 40 mg for hyperlipidemia.  She is on candesartan 16 mg for hypertension.    I saw her on June 25, 2021 she was having issues with recurrent urinary tract infections over the past 18 months.  Over the past several weeks she was started on Uqora urinary flush.  Because of potential potassium and the significant increase B6 contributing to diuresis, she reduced her spironolactone from 25 mg daily to every second or third day.  She is scheduled to undergo repeat laboratory in approximately 1 to 2 weeks.  Her blood pressure has been stable but unfortunately the cost of candesartan has become significant.  Prior to initiating candesartan, she had been on valsartan 80 mg twice a day which she had tolerated well but was changed when impurities developed leading to a long period of inaccessibility.  She denies any chest pain or shortness of breath.  She is now fully retired.  She sees Dr. Corliss Skains for rheumatology.  For that evaluation, her blood pressure was stable.  I suggested she reduce spironolactone to 12.5 mg particularly with use of uqora which contains potassium.  Due to cost issues, I switched her back to valsartan which she had tolerated in the past but had switched to candesartan several years ago when valsartan had impurities from [place] [facility].  I last saw her on May 29, 2022. Since her prior evaluation she  had issues with losing a tooth and required implantation.  She had been using an oral appliance but could not use the device for close to a year.  She admits to increased fatigue and some shortness of breath.  She typically goes to bed late often at 1 AM and wakes up at 9 AM.  He denies any chest pain.  She denies palpitations.  She often sleeps in a recliner due to hip pain.  I had an extensive discussion with her for improved sleep hygiene and optimal sleep duration.  We discussed potential future reassessment of sleep while  wearing the oral appliance to make certain she is well treated.  An echo Doppler study on June 10, 2022 showed normal LV function with EF 60 to 65%, grade 1 diastolic dysfunction,  mild aortic valve sclerosis.  There was no significant change from prior study.  Presently, Ms. Christell Constant continues to feel well.  She had undergone vascular study of her abdominal aorta with her significant family history on October 07, 2022 which did not reveal any evidence for abdominal aortic aneurysm.  The largest aortic measurement was 2.4 cm.  She will be seeing Dr. Irena Bradford at [facility] and laboratory will be obtained over the next 2 weeks.  She continues to have issues with joint pain involving her knees, hands, feet and neck.  She is followed by rheumatology and orthopedics.  She is on Zetia 10 mg and pravastatin 40 mg hyperlipidemia.  She takes spironolactone 12.5 mg as needed and typically takes this 2 days/week.  She also was on valsartan 80 mg twice a day for hypertension.  She denies chest pain or shortness of breath.  Past Medical History:  Diagnosis Date   AAA (abdominal aortic aneurysm) (HCC) 08/2014   scanning every 2 years   Arthritis    oa   Complication of anesthesia    slow to awaken in past   Family history of anesthesia complication    slow to awaken   GERD (gastroesophageal reflux disease)    Grade II diastolic dysfunction 04/2017   Noted on ECHO   H/O hiatal hernia    Headache(784.0)    migraines (rare)   Hemorrhoids    Hyperlipidemia    Hypertension 04/01/11   ECHO-EF>55% NUC STRESS TEST- 05/01/12   Hypothyroidism    Mild aortic valve regurgitation 04/2017   Noted on ECHO   Mitral valve regurgitation 04/2017   Mild: Noted on ECHO   Pulmonary nodules 2018   scanning every 6 months   Sleep apnea 05/01/12 & 05/29/12   SLEEP STUDY-[facility], NO CPAP USED SINCE MAY 2015 Uses oral device    Past Surgical History:  Procedure Laterality Date   ANKLE SURGERY  Right 1980   CARPOMETACARPEL SUSPENSION PLASTY Right 12/14/2014   Procedure: RIGHT THUMB CARPOMETACARPAL (CMC) ARTHROPLASTY;  Surgeon: Tarry Kos, MD;  Location: [facility];  Service: Orthopedics;  Laterality: Right;   CARPOMETACARPEL SUSPENSION PLASTY Left 03/19/2017   Procedure: Left Thumb Ligament Reconstruction and Tendon Interposition;  Surgeon: Tarry Kos, MD;  Location: [facility];  Service: Orthopedics;  Laterality: Left;   COLONOSCOPY WITH PROPOFOL N/A 12/07/2013   Procedure: COLONOSCOPY WITH PROPOFOL;  Surgeon: Charolett Bumpers, MD;  Location: [facility];  Service: Endoscopy;  Laterality: N/A;   CYSTECTOMY  1975   FOOT SURGERY Right 12/01/2012   FOOT SURGERY Left 12/10/2011   HEMORRHOID SURGERY  2005   HERNIA REPAIR  2019   Miscarriage  1983   TONSILLECTOMY  1964   VAGINAL HYSTERECTOMY     With pelvic floor repair    Current Medications: Outpatient Medications Prior to Visit  Medication Sig Dispense Refill   acetaminophen (TYLENOL) 500 MG tablet Take 500 mg by mouth 3 (three) times daily.     ALPRAZolam (XANAX) 0.25 MG tablet TAKE 1 TABLET BY MOUTH AT BEDTIME AS NEEDED 30 tablet 2   cephALEXin (KEFLEX) 250 MG capsule Take 250 mg by mouth daily.     diclofenac Sodium (VOLTAREN) 1 % GEL Apply 2 g topically 4 (four) times daily.     Docusate Sodium 100 MG capsule Take 200 mg by mouth daily as needed for mild constipation. Takes 300 mg  as needed     escitalopram (LEXAPRO) 20  MG tablet Take 10-20 mg by mouth every morning. Hot flashes     estradiol (ESTRACE) 0.1 MG/GM vaginal cream Place 2 g vaginally 2 (two) times a week. USE TWICE WEEKLY     Glucosamine Sulfate 1000 MG CAPS      hydrocortisone 2.5 % cream Proctosol HC 2.5 % rectal cream with applicator     levothyroxine (SYNTHROID, LEVOTHROID) 50 MCG tablet Take 50 mcg by mouth daily before breakfast.     lubiprostone (AMITIZA) 8 MCG capsule Take 8 mcg by mouth 2 (two) times daily with a  meal.     Melatonin 10 MG TABS Take 1 tablet by mouth at bedtime.     Peppermint Oil 50 MG CAPS Take 50 mg by mouth daily.     simethicone (MYLICON) 80 MG chewable tablet Chew 1 tablet (80 mg total) by mouth every 6 (six) hours as needed for flatulence (bloating). 30 tablet 0   SUMAtriptan (IMITREX) 100 MG tablet TAKE 1 TABLET BY MOUTH AS NEEDED ONE TIME ONCE A DAY 9 tablet 4   traMADol (ULTRAM) 50 MG tablet as needed.     valsartan (DIOVAN) 160 MG tablet TAKE 1/2 TABLET BY MOUTH TWICE A DAY 90 tablet 1   ezetimibe (ZETIA) 10 MG tablet TAKE 1 TABLET BY MOUTH DAILY 90 tablet 1   spironolactone (ALDACTONE) 25 MG tablet TAKE ONE TABLET BY MOUTH DAILY.  IF YOU HAVE ANY SWELLING IN THE AFTERNOON, TAKE 1/2 TABLET BY MOUTH AS NEEDED 90 tablet 0   pravastatin (PRAVACHOL) 40 MG tablet TAKE 1/2 TABLET BY MOUTH EVERY EVENING 120 tablet 2   No facility-administered medications prior to visit.     Allergies:   Midol [aspirin-cinnamedrine-caffeine], Olmesartan, Talwin [pentazocine], Bisoprolol, and Naproxen   Social History   Socioeconomic History   Marital status: Married    Spouse name: Not on file   Number of children: Not on file   Years of education: Not on file   Highest education level: Not on file  Occupational History   Not on file  Tobacco Use   Smoking status: Never    Passive exposure: Never   Smokeless tobacco: Never  Vaping Use   Vaping status: Never Used  Substance and Sexual Activity   Alcohol use: No    Alcohol/week: 0.0 standard drinks of alcohol   Drug use: No   Sexual activity: Not on file  Other Topics Concern   Not on file  Social History Narrative   Not on file   Social Determinants of Health   Financial Resource Strain: Not on file  Food Insecurity: Not on file  Transportation Needs: Not on file  Physical Activity: Not on file  Stress: Not on file  Social Connections: Not on file    She had worked as a Teacher, 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   No tobacco history.  She is fully retired.  Family History:  The patient's family history includes Breast cancer in her mother; Heart attack in her father; Heart disease in her brother and father; Hyperlipidemia in her maternal grandmother; Hypertension in her brother, maternal grandmother, and son; Migraines in her daughter; Ovarian cancer in her mother; Polymyalgia rheumatica in her paternal aunt; Raynaud syndrome in her daughter; Rheum arthritis in her mother.   ROS General: Negative; No fevers, chills, or night sweats; increasing fatigue HEENT: Transient vision change negative; No changes  or hearing, sinus congestion, difficulty swallowing Pulmonary: Negative; No cough, wheezing, shortness of breath, hemoptysis Cardiovascular: See  history of present illness, no chest pain, PND, orthopnea. GI: Positive for large hiatal hernia; is post [employer] fundoplication surgery GU: s/p elective hysterectomy and pelvic floor reconstruction Musculoskeletal: Positive for right knee discomfort wearing a right knee brace Hematologic/Oncology: Negative; no easy bruising, bleeding Rheumatology: Positive ANA Endocrine: Negative; no heat/cold intolerance; no diabetes Neuro: Negative; no changes in balance, headaches Skin: Negative; No rashes or skin lesions Psychiatric: Negative; No behavioral problems, depression Sleep: Positive for OSA, now with a customized oral appliance with mandibular advancement; she has not used consistently over the past year due to issues from losing a tooth and requiring implant  Other comprehensive 14 point system review is negative.   PHYSICAL EXAM:   VS:  BP (!) 142/76 (BP Location: Left Arm, Patient Position: Sitting, Cuff Size: Normal)   Pulse (!) 59   Ht 5\' 2"  (1.575 m)   Wt 164 lb 6.4 oz (74.6 kg)   SpO2 99%   BMI 30.07 kg/m     Repeat blood pressure by me was 122/74  Wt Readings from Last 3 Encounters:  01/21/23 164 lb 6.4 oz (74.6 kg)  10/07/22 161 lb 4.8 oz  (73.2 kg)  08/21/22 161 lb 12.8 oz (73.4 kg)    General: Alert, oriented, no distress.  Skin: normal turgor, no rashes, warm and dry HEENT: Normocephalic, atraumatic. Pupils equal round and reactive to light; sclera anicteric; extraocular muscles intact;  Nose without nasal septal hypertrophy Mouth/Parynx benign; Mallinpatti scale 3 Neck: No JVD, no carotid bruits; normal carotid upstroke Lungs: clear to ausculatation and percussion; no wheezing or rales Chest wall: without tenderness to palpitation Heart: PMI not displaced, RRR, s1 s2 normal, 1/6 systolic murmur, no diastolic murmur, no rubs, gallops, thrills, or heaves Abdomen: soft, nontender; no hepatosplenomehaly, BS+; abdominal aorta nontender and not dilated by palpation. Back: no CVA tenderness Pulses 2+ Musculoskeletal: full range of motion, normal strength, no joint deformities Extremities: no clubbing cyanosis or edema, Homan's sign negative  Neurologic: grossly nonfocal; Cranial nerves grossly wnl Psychologic: Normal mood and affect    Studies/Labs Reviewed:   EKG Interpretation Date/Time:  Tuesday January 21 2023 10:37:56 EST Ventricular Rate:  59 PR Interval:  182 QRS Duration:  80 QT Interval:  408 QTC Calculation: 403 R Axis:   -20  Text Interpretation: Sinus bradycardia Cannot rule out Anterior infarct , age undetermined When compared with ECG of 14-Mar-2017 10:35, No significant change was found Confirmed by Jenny Bradford ([id number]) on 01/21/2023 10:40:59 AM    May 29, 2022 ECG (independently read by me): NSR at 66, PRWP V1-4  June 25, 2021 ECG (independently read by me):  NSR at 60, no ectopy  January 25, 2021 ECG (independently read by me):  NSR at 60, no ectopy  April 17, 2020 ECG (independently read by me): Sinus Bradycardia at 59; no ectopy, normal intervals  August 30, 2019 ECG (independently read by me): Sinus bradycardia 55 bpm.  No ectopy.  Normal intervals.  March 02, 2019 ECG  (independently read by me): Normal sinus rhythm at 65 bpm.  No ectopy.  Normal intervals.  No ST segment changes.    September 2020 ECG (independently read by me): Normal sinus rhythm at 61 bpm.  Normal intervals.  No ectopy.  March 2020 ECG (independently read by me): Normal sinus rhythm at 69 bpm.  Poor anterior R wave progression V1 through V3.  December 08, 2017 ECG (independently read by me): NSR at 66; normal intervals  04/21/2017 ECG (independently read by me):  Normal sinus rhythm at 66 bpm.  No ST segment changes.  Normal intervals.  No ectopy.  Very mild RV conduction delay.  Recent Labs:    Latest Ref Rng & Units 08/02/2021    2:02 PM 06/18/2019    1:55 PM 11/23/2018    9:18 AM  BMP  Glucose 65 - 99 mg/dL 82  71  80   BUN 7 - 25 mg/dL 18  21  13    Creatinine 0.50 - 1.05 mg/dL 5.62  1.30  8.65   BUN/Creat Ratio 6 - 22 (calc) NOT APPLICABLE  NOT APPLICABLE  14   Sodium 135 - 146 mmol/L 135  136  137   Potassium 3.5 - 5.3 mmol/L 4.6  4.5  4.6   Chloride 98 - 110 mmol/L 100  103  103   CO2 20 - 32 mmol/L 26  25  20    Calcium 8.6 - 10.4 mg/dL 9.5  9.3  9.4         Latest Ref Rng & Units 08/02/2021    2:02 PM 06/18/2019    1:55 PM 11/23/2018    9:18 AM  Hepatic Function  Total Protein 6.1 - 8.1 g/dL 6.7  6.5  6.6   Albumin 3.8 - 4.8 g/dL   4.0   AST 10 - 35 U/L 26  17  21    ALT 6 - 29 U/L 71  21  23   Alk Phosphatase 39 - 117 IU/L   71   Total Bilirubin 0.2 - 1.2 mg/dL 0.5  0.7  0.5        Latest Ref Rng & Units 08/02/2021    2:02 PM 06/18/2019    1:55 PM 11/23/2018    9:18 AM  CBC  WBC 3.8 - 10.8 Thousand/uL 9.3  5.8  5.0   Hemoglobin 11.7 - 15.5 g/dL 78.4  69.6  29.5   Hematocrit 35.0 - 45.0 % 44.2  41.0  40.1   Platelets 140 - 400 Thousand/uL 290  248  273    Lab Results  Component Value Date   MCV 97.1 08/02/2021   MCV 97.9 06/18/2019   MCV 97 11/23/2018   Lab Results  Component Value Date   TSH 1.830 11/23/2018   No results found for: "HGBA1C"   BNP     Component Value Date/Time   BNP 30.6 11/23/2018 0918    ProBNP No results found for: "PROBNP"   Lipid Panel     Component Value Date/Time   CHOL 147 09/09/2019 0951   TRIG 135 09/09/2019 0951   HDL 57 09/09/2019 0951   CHOLHDL 2.6 09/09/2019 0951   LDLCALC 67 09/09/2019 0951     RADIOLOGY: No results found.   Additional studies/ records that were reviewed today include:  I reviewed the patient's prior evaluation at the [facility] in 2014.  following a sleep study.  I reviewed her imaging studies, and prior evaluation by Dr. Sherene Sires.  I reviewed her rheumatology evaluation from April 13, 2019   ASSESSMENT:    1. Essential hypertension   2. Hypothyroidism, unspecified type   3. Pure hypercholesterolemia   4. Grade I diastolic dysfunction   5. OSA (obstructive sleep apnea)   6. Family history of coronary artery disease in father   36. Large hiatal hernia s/p [employer] plication     PLAN:  Ms. 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  Remotely she had been on losartan for hypertension and pravastatin for hyperlipidemia.  At prior office visits her blood pressure was controlled on her regimen consisting of candesartan 16 mg and spironolactone 12.5 mg daily.  Remotely she had issues with myalgias on atorvastatin.  She has been able to tolerate Zetia 10 mg and pravastatin 20 mg with prior lipid studies showing LDL at 51.  An echo Doppler study in June 2020 showed an EF of with grade 2 diastolic dysfunction, mild AR and mild MR.  She developed COVID in August 2022 and was ill for approximately a month.  Subsequent echo was done due to shortness of breath in December 2022 which continue to show normal LV function.  Diastolic dysfunction had improved and was now grade 1 and there was evidence for mild aortic sclerosis without stenosis.  At  a subsequent office visit, she was taken off candesartan due to cost issues and was on valsartan 80 mg twice a day.  She also is on spironolactone and now takes is 12.5 mg daily.  There is no edema.  Over the past year she had issues with losing a tooth requiring dental implant and as result she was unable to use her oral appliance for close to a year.  Subsequently, she was experiencing more fatigability and was often sleeping in a recliner due to hip pain.  She has continued to have joint issues involving her knees, hands, feet and neck.  She is followed by rheumatology as well as by orthopedics.  Her blood pressure today is stable and on repeat by me was 123/74 on valsartan 80 mg twice a day in addition to spironolactone which he now takes 12.5 mg every other day or times only 2 days/week.  She sees Dr. Corliss Skains for elevated ANA and continues surveillance for potential autoimmune disease.  Her blood pressure when checked by me today was stable.  She will be undergoing repeat laboratory with Dr. Thomasena Edis over the next 2 weeks.  I have recommended that she have her check LP(a) for assessment.  I will see her in 6 months for follow-up evaluation.  I discussed my plans for future retirement.  I discussed transitioning to Dr. Janne Napoleon after my retirement.   Medication Adjustments/Labs and Tests Ordered: Current medicines are reviewed at length with the patient today.  Concerns regarding medicines are outlined above.  Medication changes, Labs and Tests ordered today are listed in the Patient Instructions below. Patient Instructions  Medication Instructions:  No changes *If you need a refill on your cardiac medications before your next appointment, please call your pharmacy*   Lab Work: No labs ordered.  If you have labs (blood work) drawn today and your tests are completely normal, you will receive your results only by: MyChart Message (if you have MyChart) OR A paper copy in the mail If you  have any lab test that is abnormal or we need to change your treatment, we will call you to review the results.   Testing/Procedures: No testing   Follow-Up: At [facility], you and your health needs are our priority.  As part of our continuing mission to provide you with exceptional heart care, we have created designated Provider Care Teams.  These Care Teams include your primary Cardiologist (physician) and Advanced Practice Providers (APPs -  Physician Assistants and Nurse Practitioners) who all work together to provide you with the care you need, when you need it.  We recommend  signing up for the patient portal called "MyChart".  Sign up information is provided on this After Visit Summary.  MyChart is used to connect with patients for Virtual Visits (Telemedicine).  Patients are able to view lab/test results, encounter notes, upcoming appointments, etc.  Non-urgent messages can be sent to your provider as well.   To learn more about what you can do with MyChart, go to [email or web address].    Your next appointment:   6 month(s)  Provider:   Jodelle Red, MD       Signed, Jenny Guadalajara, MD  01/26/2023 10:54 AM    [facility] [address], [place], [place]  [address] Phone: [phone]

## 2023-01-21 NOTE — Patient Instructions (Signed)
Medication Instructions:  No changes *If you need a refill on your cardiac medications before your next appointment, please call your pharmacy*   Lab Work: No labs ordered.  If you have labs (blood work) drawn today and your tests are completely normal, you will receive your results only by: MyChart Message (if you have MyChart) OR A paper copy in the mail If you have any lab test that is abnormal or we need to change your treatment, we will call you to review the results.   Testing/Procedures: No testing   Follow-Up: At [facility], you and your health needs are our priority.  As part of our continuing mission to provide you with exceptional heart care, we have created designated Provider Care Teams.  These Care Teams include your primary Cardiologist (physician) and Advanced Practice Providers (APPs -  Physician Assistants and Nurse Practitioners) who all work together to provide you with the care you need, when you need it.  We recommend signing up for the patient portal called "MyChart".  Sign up information is provided on this After Visit Summary.  MyChart is used to connect with patients for Virtual Visits (Telemedicine).  Patients are able to view lab/test results, encounter notes, upcoming appointments, etc.  Non-urgent messages can be sent to your provider as well.   To learn more about what you can do with MyChart, go to [email or web address].    Your next appointment:   6 month(s)  Provider:   Jodelle Red, MD

## 2023-01-22 ENCOUNTER — Other Ambulatory Visit: Payer: Self-pay | Admitting: Cardiovascular Disease

## 2023-01-22 DIAGNOSIS — R0600 Dyspnea, unspecified: Secondary | ICD-10-CM

## 2023-01-26 ENCOUNTER — Encounter: Payer: Self-pay | Admitting: Cardiovascular Disease

## 2023-01-28 DIAGNOSIS — N3020 Other chronic cystitis without hematuria: Secondary | ICD-10-CM | POA: Diagnosis not present

## 2023-01-31 DIAGNOSIS — R152 Fecal urgency: Secondary | ICD-10-CM | POA: Diagnosis not present

## 2023-01-31 DIAGNOSIS — N3946 Mixed incontinence: Secondary | ICD-10-CM | POA: Diagnosis not present

## 2023-01-31 DIAGNOSIS — M6281 Muscle weakness (generalized): Secondary | ICD-10-CM | POA: Diagnosis not present

## 2023-01-31 DIAGNOSIS — R150 Incomplete defecation: Secondary | ICD-10-CM | POA: Diagnosis not present

## 2023-01-31 DIAGNOSIS — K5900 Constipation, unspecified: Secondary | ICD-10-CM | POA: Diagnosis not present

## 2023-02-03 DIAGNOSIS — I129 Hypertensive chronic kidney disease with stage 1 through stage 4 chronic kidney disease, or unspecified chronic kidney disease: Secondary | ICD-10-CM | POA: Diagnosis not present

## 2023-02-03 DIAGNOSIS — E559 Vitamin D deficiency, unspecified: Secondary | ICD-10-CM | POA: Diagnosis not present

## 2023-02-03 DIAGNOSIS — E039 Hypothyroidism, unspecified: Secondary | ICD-10-CM | POA: Diagnosis not present

## 2023-02-03 DIAGNOSIS — Z79899 Other long term (current) drug therapy: Secondary | ICD-10-CM | POA: Diagnosis not present

## 2023-02-03 DIAGNOSIS — E785 Hyperlipidemia, unspecified: Secondary | ICD-10-CM | POA: Diagnosis not present

## 2023-02-03 DIAGNOSIS — I10 Essential (primary) hypertension: Secondary | ICD-10-CM | POA: Diagnosis not present

## 2023-02-05 LAB — LAB REPORT - SCANNED
A1c: 5.3
EGFR: 92

## 2023-02-10 DIAGNOSIS — Z79899 Other long term (current) drug therapy: Secondary | ICD-10-CM | POA: Diagnosis not present

## 2023-02-10 DIAGNOSIS — G43709 Chronic migraine without aura, not intractable, without status migrainosus: Secondary | ICD-10-CM | POA: Diagnosis not present

## 2023-02-10 DIAGNOSIS — E785 Hyperlipidemia, unspecified: Secondary | ICD-10-CM | POA: Diagnosis not present

## 2023-02-10 DIAGNOSIS — F5104 Psychophysiologic insomnia: Secondary | ICD-10-CM | POA: Diagnosis not present

## 2023-02-10 DIAGNOSIS — E559 Vitamin D deficiency, unspecified: Secondary | ICD-10-CM | POA: Diagnosis not present

## 2023-02-10 DIAGNOSIS — K5900 Constipation, unspecified: Secondary | ICD-10-CM | POA: Diagnosis not present

## 2023-02-10 DIAGNOSIS — G4733 Obstructive sleep apnea (adult) (pediatric): Secondary | ICD-10-CM | POA: Diagnosis not present

## 2023-02-10 DIAGNOSIS — I10 Essential (primary) hypertension: Secondary | ICD-10-CM | POA: Diagnosis not present

## 2023-02-10 DIAGNOSIS — M8589 Other specified disorders of bone density and structure, multiple sites: Secondary | ICD-10-CM | POA: Diagnosis not present

## 2023-02-10 DIAGNOSIS — E039 Hypothyroidism, unspecified: Secondary | ICD-10-CM | POA: Diagnosis not present

## 2023-02-10 DIAGNOSIS — Z0000 Encounter for general adult medical examination without abnormal findings: Secondary | ICD-10-CM | POA: Diagnosis not present

## 2023-02-10 DIAGNOSIS — M159 Polyosteoarthritis, unspecified: Secondary | ICD-10-CM | POA: Diagnosis not present

## 2023-02-26 NOTE — Progress Notes (Unsigned)
Office Visit Note   Patient: Jenny Bradford           Date of Birth: 03-01-1951           MRN: [id number] Visit Date: 02/27/2023              Requested by: Irena Reichmann, DO [address] [place],  [place] [address] PCP: Irena Reichmann, DO   Assessment & Plan: Visit Diagnoses: No diagnosis found.  Plan: ***  Follow-Up Instructions: No follow-ups on file.   Orders:  No orders of the defined types were placed in this encounter.  No orders of the defined types were placed in this encounter.     Procedures: No procedures performed   Clinical Data: No additional findings.   Subjective: No chief complaint on file.   HPI  Review of Systems  Constitutional: Negative.   HENT: Negative.    Eyes: Negative.   Respiratory: Negative.    Cardiovascular: Negative.   Endocrine: Negative.   Musculoskeletal: Negative.   Neurological: Negative.   Hematological: Negative.   Psychiatric/Behavioral: Negative.    All other systems reviewed and are negative.   Objective: Vital Signs: There were no vitals taken for this visit.  Physical Exam Vitals and nursing note reviewed.  Constitutional:      Appearance: She is well-developed.  HENT:     Head: Atraumatic.     Nose: Nose normal.  Eyes:     Extraocular Movements: Extraocular movements intact.  Cardiovascular:     Pulses: Normal pulses.  Pulmonary:     Effort: Pulmonary effort is normal.  Abdominal:     Palpations: Abdomen is soft.  Musculoskeletal:     Cervical back: Neck supple.  Skin:    General: Skin is warm.     Capillary Refill: Capillary refill takes less than 2 seconds.  Neurological:     Mental Status: She is alert. Mental status is at baseline.  Psychiatric:        Behavior: Behavior normal.        Thought Content: Thought content normal.        Judgment: Judgment normal.   Ortho Exam  Specialty Comments:  No specialty comments available.  Imaging: No results found.   PMFS  History: Patient Active Problem List   Diagnosis Date Noted  . Bilateral hip pain 01/02/2021  . Primary osteoarthritis of left knee 01/26/2019  . Primary osteoarthritis of right knee 01/26/2019  . Palpitations 09/04/2017  . Family history of coronary artery disease in father 09/04/2017  . Paraesophageal hernia 05/13/2017  . Neck pain 01/03/2017  . Large hiatal hernia 12/28/2016  . Multiple pulmonary nodules determined by computed tomography of lung 12/27/2016  . Arthritis of carpometacarpal [facility]) joint of left thumb 05/28/2016  . OSA (obstructive sleep apnea) 10/05/2012  . HTN (hypertension) 10/05/2012  . Hyperlipidemia 10/05/2012  . Migraine headache 10/05/2012   Past Medical History:  Diagnosis Date  . AAA (abdominal aortic aneurysm) (HCC) 08/2014   scanning every 2 years  . Arthritis    oa  . Complication of anesthesia    slow to awaken in past  . Family history of anesthesia complication    slow to awaken  . GERD (gastroesophageal reflux disease)   . Grade II diastolic dysfunction 04/2017   Noted on ECHO  . H/O hiatal hernia   . Headache(784.0)    migraines (rare)  . Hemorrhoids   . Hyperlipidemia   . Hypertension 04/01/11   ECHO-EF>55% NUC  STRESS TEST- 05/01/12  . Hypothyroidism   . Mild aortic valve regurgitation 04/2017   Noted on ECHO  . Mitral valve regurgitation 04/2017   Mild: Noted on ECHO  . Pulmonary nodules 2018   scanning every 6 months  . Sleep apnea 05/01/12 & 05/29/12   SLEEP STUDY-[facility], NO CPAP USED SINCE MAY 2015 Uses oral device    Family History  Problem Relation Age of Onset  . Breast cancer Mother   . Ovarian cancer Mother   . Rheum arthritis Mother   . Heart attack Father   . Heart disease Father   . Hypertension Brother   . Heart disease Brother   . Hyperlipidemia Maternal Grandmother   . Hypertension Maternal Grandmother   . Polymyalgia rheumatica Paternal Aunt   . Hypertension Son   . Migraines Daughter   .  Raynaud syndrome Daughter     Past Surgical History:  Procedure Laterality Date  . ANKLE SURGERY Right 1980  . CARPOMETACARPEL SUSPENSION PLASTY Right 12/14/2014   Procedure: RIGHT THUMB CARPOMETACARPAL (CMC) ARTHROPLASTY;  Surgeon: Tarry Kos, MD;  Location: [facility];  Service: Orthopedics;  Laterality: Right;  . CARPOMETACARPEL SUSPENSION PLASTY Left 03/19/2017   Procedure: Left Thumb Ligament Reconstruction and Tendon Interposition;  Surgeon: Tarry Kos, MD;  Location: [facility];  Service: Orthopedics;  Laterality: Left;  . COLONOSCOPY WITH PROPOFOL N/A 12/07/2013   Procedure: COLONOSCOPY WITH PROPOFOL;  Surgeon: Charolett Bumpers, MD;  Location: [facility];  Service: Endoscopy;  Laterality: N/A;  . CYSTECTOMY  1975  . FOOT SURGERY Right 12/01/2012  . FOOT SURGERY Left 12/10/2011  . HEMORRHOID SURGERY  2005  . HERNIA REPAIR  2019  . Miscarriage  45  . TONSILLECTOMY  1964  . VAGINAL HYSTERECTOMY     With pelvic floor repair   Social History   Occupational History  . Not on file  Tobacco Use  . Smoking status: Never    Passive exposure: Never  . Smokeless tobacco: Never  Vaping Use  . Vaping status: Never Used  Substance and Sexual Activity  . Alcohol use: No    Alcohol/week: 0.0 standard drinks of alcohol  . Drug use: No  . Sexual activity: Not on file

## 2023-02-27 ENCOUNTER — Other Ambulatory Visit (INDEPENDENT_AMBULATORY_CARE_PROVIDER_SITE_OTHER): Payer: Medicare Other

## 2023-02-27 ENCOUNTER — Ambulatory Visit: Payer: Medicare Other | Admitting: Orthopaedic Surgery

## 2023-02-27 DIAGNOSIS — M19071 Primary osteoarthritis, right ankle and foot: Secondary | ICD-10-CM | POA: Diagnosis not present

## 2023-02-27 DIAGNOSIS — M1711 Unilateral primary osteoarthritis, right knee: Secondary | ICD-10-CM

## 2023-02-27 DIAGNOSIS — M25571 Pain in right ankle and joints of right foot: Secondary | ICD-10-CM | POA: Diagnosis not present

## 2023-02-27 DIAGNOSIS — M25561 Pain in right knee: Secondary | ICD-10-CM

## 2023-02-27 MED ORDER — TRAMADOL HCL 50 MG PO TABS: 50.0000 mg | ORAL_TABLET | Freq: Every day | ORAL | 0 refills | Status: DC | PRN

## 2023-02-27 MED ORDER — PREDNISONE 10 MG (21) PO TBPK: ORAL_TABLET | ORAL | 3 refills | Status: DC

## 2023-03-10 DIAGNOSIS — M6281 Muscle weakness (generalized): Secondary | ICD-10-CM | POA: Diagnosis not present

## 2023-03-10 DIAGNOSIS — R350 Frequency of micturition: Secondary | ICD-10-CM | POA: Diagnosis not present

## 2023-03-10 DIAGNOSIS — N3946 Mixed incontinence: Secondary | ICD-10-CM | POA: Diagnosis not present

## 2023-03-10 DIAGNOSIS — K5900 Constipation, unspecified: Secondary | ICD-10-CM | POA: Diagnosis not present

## 2023-03-10 DIAGNOSIS — R150 Incomplete defecation: Secondary | ICD-10-CM | POA: Diagnosis not present

## 2023-03-14 DIAGNOSIS — K5901 Slow transit constipation: Secondary | ICD-10-CM | POA: Diagnosis not present

## 2023-03-14 DIAGNOSIS — K623 Rectal prolapse: Secondary | ICD-10-CM | POA: Diagnosis not present

## 2023-03-18 DIAGNOSIS — N3946 Mixed incontinence: Secondary | ICD-10-CM | POA: Diagnosis not present

## 2023-03-18 DIAGNOSIS — R152 Fecal urgency: Secondary | ICD-10-CM | POA: Diagnosis not present

## 2023-03-18 DIAGNOSIS — R102 Pelvic and perineal pain: Secondary | ICD-10-CM | POA: Diagnosis not present

## 2023-03-18 DIAGNOSIS — K5900 Constipation, unspecified: Secondary | ICD-10-CM | POA: Diagnosis not present

## 2023-03-25 DIAGNOSIS — M6281 Muscle weakness (generalized): Secondary | ICD-10-CM | POA: Diagnosis not present

## 2023-03-25 DIAGNOSIS — N3946 Mixed incontinence: Secondary | ICD-10-CM | POA: Diagnosis not present

## 2023-03-25 DIAGNOSIS — R350 Frequency of micturition: Secondary | ICD-10-CM | POA: Diagnosis not present

## 2023-04-08 DIAGNOSIS — N3946 Mixed incontinence: Secondary | ICD-10-CM | POA: Diagnosis not present

## 2023-04-08 DIAGNOSIS — K5900 Constipation, unspecified: Secondary | ICD-10-CM | POA: Diagnosis not present

## 2023-04-08 DIAGNOSIS — M6281 Muscle weakness (generalized): Secondary | ICD-10-CM | POA: Diagnosis not present

## 2023-04-15 DIAGNOSIS — N3946 Mixed incontinence: Secondary | ICD-10-CM | POA: Diagnosis not present

## 2023-04-15 DIAGNOSIS — K5900 Constipation, unspecified: Secondary | ICD-10-CM | POA: Diagnosis not present

## 2023-04-15 DIAGNOSIS — M6281 Muscle weakness (generalized): Secondary | ICD-10-CM | POA: Diagnosis not present

## 2023-04-22 DIAGNOSIS — K629 Disease of anus and rectum, unspecified: Secondary | ICD-10-CM | POA: Diagnosis not present

## 2023-04-23 ENCOUNTER — Other Ambulatory Visit: Payer: Self-pay | Admitting: Orthopaedic Surgery

## 2023-04-25 ENCOUNTER — Encounter: Payer: Self-pay | Admitting: Cardiovascular Disease

## 2023-04-29 DIAGNOSIS — K5900 Constipation, unspecified: Secondary | ICD-10-CM | POA: Diagnosis not present

## 2023-04-29 DIAGNOSIS — N3946 Mixed incontinence: Secondary | ICD-10-CM | POA: Diagnosis not present

## 2023-04-29 DIAGNOSIS — R102 Pelvic and perineal pain: Secondary | ICD-10-CM | POA: Diagnosis not present

## 2023-04-29 DIAGNOSIS — M6281 Muscle weakness (generalized): Secondary | ICD-10-CM | POA: Diagnosis not present

## 2023-04-29 DIAGNOSIS — N3020 Other chronic cystitis without hematuria: Secondary | ICD-10-CM | POA: Diagnosis not present

## 2023-05-06 DIAGNOSIS — M6281 Muscle weakness (generalized): Secondary | ICD-10-CM | POA: Diagnosis not present

## 2023-05-06 DIAGNOSIS — K5900 Constipation, unspecified: Secondary | ICD-10-CM | POA: Diagnosis not present

## 2023-05-06 DIAGNOSIS — N3946 Mixed incontinence: Secondary | ICD-10-CM | POA: Diagnosis not present

## 2023-05-14 DIAGNOSIS — R350 Frequency of micturition: Secondary | ICD-10-CM | POA: Diagnosis not present

## 2023-05-14 DIAGNOSIS — M6281 Muscle weakness (generalized): Secondary | ICD-10-CM | POA: Diagnosis not present

## 2023-05-14 DIAGNOSIS — N3946 Mixed incontinence: Secondary | ICD-10-CM | POA: Diagnosis not present

## 2023-05-14 DIAGNOSIS — K5900 Constipation, unspecified: Secondary | ICD-10-CM | POA: Diagnosis not present

## 2023-05-15 DIAGNOSIS — K623 Rectal prolapse: Secondary | ICD-10-CM | POA: Diagnosis not present

## 2023-05-15 DIAGNOSIS — Z6829 Body mass index (BMI) 29.0-29.9, adult: Secondary | ICD-10-CM | POA: Diagnosis not present

## 2023-05-28 DIAGNOSIS — N3946 Mixed incontinence: Secondary | ICD-10-CM | POA: Diagnosis not present

## 2023-05-28 DIAGNOSIS — M6281 Muscle weakness (generalized): Secondary | ICD-10-CM | POA: Diagnosis not present

## 2023-05-28 DIAGNOSIS — K5900 Constipation, unspecified: Secondary | ICD-10-CM | POA: Diagnosis not present

## 2023-06-26 DIAGNOSIS — N3946 Mixed incontinence: Secondary | ICD-10-CM | POA: Diagnosis not present

## 2023-06-26 DIAGNOSIS — R350 Frequency of micturition: Secondary | ICD-10-CM | POA: Diagnosis not present

## 2023-06-26 DIAGNOSIS — K5900 Constipation, unspecified: Secondary | ICD-10-CM | POA: Diagnosis not present

## 2023-06-26 DIAGNOSIS — M6281 Muscle weakness (generalized): Secondary | ICD-10-CM | POA: Diagnosis not present

## 2023-06-26 DIAGNOSIS — R102 Pelvic and perineal pain: Secondary | ICD-10-CM | POA: Diagnosis not present

## 2023-07-03 DIAGNOSIS — M6281 Muscle weakness (generalized): Secondary | ICD-10-CM | POA: Diagnosis not present

## 2023-07-03 DIAGNOSIS — R102 Pelvic and perineal pain: Secondary | ICD-10-CM | POA: Diagnosis not present

## 2023-07-03 DIAGNOSIS — K5900 Constipation, unspecified: Secondary | ICD-10-CM | POA: Diagnosis not present

## 2023-07-03 DIAGNOSIS — N3946 Mixed incontinence: Secondary | ICD-10-CM | POA: Diagnosis not present

## 2023-07-04 ENCOUNTER — Other Ambulatory Visit: Payer: Self-pay

## 2023-07-04 MED ORDER — VALSARTAN 160 MG PO TABS: 80.0000 mg | ORAL_TABLET | Freq: Two times a day (BID) | ORAL | 2 refills | Status: DC

## 2023-07-17 DIAGNOSIS — R102 Pelvic and perineal pain: Secondary | ICD-10-CM | POA: Diagnosis not present

## 2023-07-17 DIAGNOSIS — K5900 Constipation, unspecified: Secondary | ICD-10-CM | POA: Diagnosis not present

## 2023-07-17 DIAGNOSIS — M6281 Muscle weakness (generalized): Secondary | ICD-10-CM | POA: Diagnosis not present

## 2023-07-23 ENCOUNTER — Encounter: Payer: Self-pay | Admitting: Cardiovascular Disease

## 2023-07-23 ENCOUNTER — Ambulatory Visit: Payer: Medicare Other | Attending: Cardiovascular Disease | Admitting: Cardiovascular Disease

## 2023-07-23 VITALS — BP 124/82 | HR 59 | Ht 62.0 in | Wt 160.6 lb

## 2023-07-23 DIAGNOSIS — G4733 Obstructive sleep apnea (adult) (pediatric): Secondary | ICD-10-CM | POA: Insufficient documentation

## 2023-07-23 DIAGNOSIS — E039 Hypothyroidism, unspecified: Secondary | ICD-10-CM | POA: Diagnosis not present

## 2023-07-23 DIAGNOSIS — Z8249 Family history of ischemic heart disease and other diseases of the circulatory system: Secondary | ICD-10-CM | POA: Diagnosis not present

## 2023-07-23 DIAGNOSIS — I10 Essential (primary) hypertension: Secondary | ICD-10-CM | POA: Insufficient documentation

## 2023-07-23 DIAGNOSIS — K449 Diaphragmatic hernia without obstruction or gangrene: Secondary | ICD-10-CM | POA: Diagnosis not present

## 2023-07-23 DIAGNOSIS — I5189 Other ill-defined heart diseases: Secondary | ICD-10-CM | POA: Diagnosis not present

## 2023-07-23 NOTE — Progress Notes (Unsigned)
 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  I had cared for her father Jenny Bradford for many years who ultimately died on 04-Feb-2014 at [facility].  She established cardiology care with me in February 2019.  I last saw her in November  2024. She presents for a 6 month follow-up evaluation.   [name] has a history of hypertension for many years.  Most recently, she has been on losartan  75 mg daily in addition to HCTZ 25 mg.  She also has a history of hypothyroidism and has been on levothyroxine  at 50 g.  There is a history of hyperlipidemia and she has been on pravastatin  40 mg.  Her father had a history of significant abdominal aortic aneurysm.  She underwent abdominal aortic aneurysm screening and there was no significant aortic dilatation and instructed with a maximum aortic dimension at 2.3 cm.  She has a history of obstructive sleep apnea which was diagnosed in 2014 with an AHI of 14 per hour and RDI of 16.5 per hour.  She had significant oxygen desaturation to a nadir of 81% with rems sleep and had loud snoring.  Apparently should use CPAP for a short while but ultimately became intolerant was ultimately transitioned to an oral appliance for which she is followed by Dr. Kalman Ores.   When I saw her in February 2019 she had begun to notice that despite taking medication for her blood pressure, her blood pressure elevated.  She was walking 4 days per week for 30 minutes at a time.  She denied any exertionally precipitation of chest pain.  She was unaware of any significant valvular abnormality, although her mother has aortic stenosis.  She also has a very large hiatal hernia and has seen Dr. Kimble Pennant and was told that in the future she may require  surgery.  She underwent surgery for paraesophageal hernia repair with a Nissen fundoplication in March 2019.  She also has had progressive difficulty with osteoarthritis involving her neck, back, hands, feet, and feet.  He has not been able to exercise as regularly.  She is no longer working since she is unable to stand up the entire day.  She now sees [name] for primary care.  She has been caring for her husband who is permanently disabled since age 6.    When I  saw her on December 08, 2017 as result of the generic impurities I had recommended she change from losartan  100 mg daily to olmesartan  40 mg.  Her lipid studies were not optimal on pravastatin  and I suggested discontinuance of this and in its place started atorvastatin  40 mg.  An echo Doppler study in February 2019 showed an EF of 60 to 65% with grade 2 diastolic dysfunction, mild AR and mild MR.  An event monitor had shown sinus rhythm with low average heart rate at 50 and high average heart rate at 70 bpm.  She had several atrial couplets and one atrial triplet.  There were no episodes of atrial fibrillation.  There was no ventricular ectopy or pauses.  I saw her in March 2020 after she had called the office and wished to be seen sooner than her scheduled appointment  due to progressive fatigue and particularly shortness of breath particularly while climbing steps.  She had been self adjusting some of her medications and was no longer on olmesartan  but has been on valsartan  160 mg.  However she felt better when she cuts this in half and had been taking 80 mg twice a day.  Denied any chest pain and was walking 1-1 and half miles per day.  She was only using CPAP intermittently and had adamantly using a customized oral appliance.   She was evaluated in a telemedicine visit in June 2020.  At that time she was  on candesartan  for ARB therapy.  Her dose was further titrated to 60 mg in the morning and 8 mg in the evening.  She was on  atorvastatin  40 mg on Monday Wednesday and Friday for hyperlipidemia.  She was using an oral appliance for obstructive sleep apnea from Dr. Kalman Ores.  An echo Doppler study from August 13, 2018 showed normal systolic function with EF at 55 to 60%.  There was mild aortic sclerosis with mild AR.  The aortic root and ascending aorta and aortic arch were normal in dimensions.  I saw her on December 07, 2018 which time she was complaining of some muscle aching.  As result she stopped taking atorvastatin  several weeks prior to that evaluation.  Previously she felt she tolerated pravastatin  better.  She had noticed fatigue and at times low blood pressures.  She will be undergoing a hysterectomy in late January.  I reviewed laboratory from November 23, 2018.  BUN 13 creatinine 0.94.  LFTs normal.  Stable CBC.  Compared to March 2020, cholesterol increased from 140 to162.  LDL increased from 57 while on atorvastatin  daily to 84 on her reduced dose.  During that evaluation, recommended institution of Zetia  10 mg.  2 weeks thereafter I recommended resumption of pravastatin  which she had tolerated better than atorvastatin .  I also checked a CPK and erythrocyte sedimentation rate which were normal arguing against myopathy or rhabdomyolysis.  She was using her oral appliance for obstructive sleep apnea.  I saw her in December 2020 and over the several months prior to that evaluation she denied any episodes of chest tightness or pressure.  She was contemplating whether or not to go through her elective neurectomy and pelvic floor reconstruction January with the spike in COVID-19 this is to be done by a urogynecologist at [facility].  She admits to fatigue.  She has not had recent laboratory on Zetia  and and pravastatin  which she has been taking 20 mg.  She continued to be on candesartan  16 mg, spironolactone  12.5 mg for hypertension and  levothyroxine  50 mcg for hypothyroidism.  During that evaluation she continued to  have some fatigue but admitted that she was not consistently using her oral appliance.  I saw her in June 2021 and since her prior evaluation she was found to have positive ANA and was seen by Dr. Georgiann Kirsch.  She was started on aspirin therapy and subsequent laboratory showed improvement of inflammatory markers.  Subsequently she felt better.  She was never started on immunosuppressive therapy.  She has been wearing a knee brace and is followed by Dr. Christiane Cowing of orthopedics.  Most recently she did not tolerate a trial of atorvastatin  for more aggressive lipid-lowering and resume taking pravastatin  20 mg in addition to Zetia  for hyperlipidemia.  Her grandchild was born 4 weeks ago and she is now scheduled for her elective hysterectomy to be done in  August 2021.  She was seen by me on April 17, 2020 and since her prior evaluation she continued to do well.   She underwent her urogynecologic surgery October 28, 2019 and tolerated this well.  She has been able to tolerate Zetia  and Pravachol  statin and lipid studies in October 2021 were markedly improved with a total cholesterol of 130, LDL of 51, triglycerides at 98 and HDL 59.  She did have some issues with reading where she would sense a gap and miss words.  She was evaluated by her ophthalmologist who felt her vision is stable.  She will be undergoing reassessment of ANA at follow-up with Dr. Georgiann Kirsch.  I saw her on January 25, 2021.  Since her prior evaluation, she developed COVID in August 2022 and had fever fatigue sore throat and cough for approximately 1 month.  Subsequently, she has noticed some right leg swelling.  She admits to shortness of breath which seems to occur with decreased activity from previously.  She admits that she has not been exercising regularly.  She also experiences pain in her right foot and has been diagnosed with increased arthritis and bone spurs.  She has been taking spironolactone  25 mg daily, she is on chronic cephalexin for  prevention of recurrent UTIs.  She continues to be on Zetia  and pravastatin  40 mg for hyperlipidemia.  She is on candesartan  16 mg for hypertension.    I saw her on June 25, 2021 she was having issues with recurrent urinary tract infections over the past 18 months.  Over the past several weeks she was started on Uqora urinary flush.  Because of potential potassium and the significant increase B6 contributing to diuresis, she reduced her spironolactone  from 25 mg daily to every second or third day.  She is scheduled to undergo repeat laboratory in approximately 1 to 2 weeks.  Her blood pressure has been stable but unfortunately the cost of candesartan  has become significant.  Prior to initiating candesartan , she had been on valsartan  80 mg twice a day which she had tolerated well but was changed when impurities developed leading to a long period of inaccessibility.  She denies any chest pain or shortness of breath.  She is now fully retired.  She sees Dr. Alvira Josephs for rheumatology.  For that evaluation, her blood pressure was stable.  I suggested she reduce spironolactone  to 12.5 mg particularly with use of uqora which contains potassium.  Due to cost issues, I switched her back to valsartan  which she had tolerated in the past but had switched to candesartan  several years ago when valsartan  had impurities from [place] labs.  I saw her on May 29, 2022. Since her prior evaluation she  had issues with losing a tooth and required implantation.  She had been using an oral appliance but could not use the device for close to a year.  She admits to increased fatigue and some shortness of breath.  She typically goes to bed late often at 1 AM and wakes up at 9 AM.  He denies any chest pain.  She denies palpitations.  She often sleeps in a recliner due to hip pain.  I had an extensive discussion with her for improved sleep hygiene and optimal sleep duration.  We discussed potential future reassessment of sleep while  wearing the oral appliance to make certain she is well treated.  An echo Doppler study on June 10, 2022 showed normal LV function with EF 60 to 65%, grade 1 diastolic dysfunction,  mild aortic valve sclerosis.  There was no significant change from prior study.  I last saw her on January 21, 2023 at which time she continued to feel well.  She had undergone vascular study of her abdominal aorta with her significant family history on October 07, 2022 which did not reveal any evidence for abdominal aortic aneurysm.  The largest aortic measurement was 2.4 cm.  She will be seeing Dr. Pete Brand at [facility] and laboratory will be obtained over the next 2 weeks.  She continues to have issues with joint pain involving her knees, hands, feet and neck.  She is followed by rheumatology and orthopedics.  She is on Zetia  10 mg and pravastatin  40 mg hyperlipidemia.  She takes spironolactone  12.5 mg as needed and typically takes this 2 days/week.  She also was on valsartan  80 mg twice a day for hypertension.  I recommended that she have LP with delay assessment.  Since I last saw her, she has felt well but has difficulty with osteoarthritis involving her fingers knees and shoulders.  She is on valsartan  160 mg, spironolactone  25 mg for hypertension.  She continues to be on Zetia  10 mg and apparently has been taking pravastatin  20 mg (one half of a 40 mg pill.  Laboratory by Dr. Hazeline Lister in November 2025 showed total cholesterol 174 HDL 55 LDL 92 and triglycerides 154.  She denies any chest pain or awareness of palpitations.  She is on levothyroxine  50 mcg for hypothyroidism.  She takes Lexapro  for hot flashes.  She presents for evaluation.   Past Medical History:  Diagnosis Date   AAA (abdominal aortic aneurysm) (HCC) 08/2014   scanning every 2 years   Arthritis    oa   Complication of anesthesia    slow to awaken in past   Family history of anesthesia complication    slow to awaken   GERD  (gastroesophageal reflux disease)    Grade II diastolic dysfunction 04/2017   Noted on ECHO   H/O hiatal hernia    Headache(784.0)    migraines (rare)   Hemorrhoids    Hyperlipidemia    Hypertension 04/01/11   ECHO-EF>55% NUC STRESS TEST- 05/01/12   Hypothyroidism    Mild aortic valve regurgitation 04/2017   Noted on ECHO   Mitral valve regurgitation 04/2017   Mild: Noted on ECHO   Pulmonary nodules 2018   scanning every 6 months   Sleep apnea 05/01/12 & 05/29/12   SLEEP STUDY-[facility], NO CPAP USED SINCE MAY 2015 Uses oral device    Past Surgical History:  Procedure Laterality Date   ANKLE SURGERY Right 1980   CARPOMETACARPEL SUSPENSION PLASTY Right 12/14/2014   Procedure: RIGHT THUMB CARPOMETACARPAL (CMC) ARTHROPLASTY;  Surgeon: Wes Hamman, MD;  Location: [facility];  Service: Orthopedics;  Laterality: Right;   CARPOMETACARPEL SUSPENSION PLASTY Left 03/19/2017   Procedure: Left Thumb Ligament Reconstruction and Tendon Interposition;  Surgeon: Wes Hamman, MD;  Location: [facility];  Service: Orthopedics;  Laterality: Left;   COLONOSCOPY WITH PROPOFOL  N/A 12/07/2013   Procedure: COLONOSCOPY WITH PROPOFOL ;  Surgeon: Garrett Kallman, MD;  Location: [facility];  Service: Endoscopy;  Laterality: N/A;   CYSTECTOMY  1975   FOOT SURGERY Right 12/01/2012   FOOT SURGERY Left 12/10/2011   HEMORRHOID SURGERY  2005   HERNIA REPAIR  2019   Miscarriage  1983   TONSILLECTOMY  1964   VAGINAL HYSTERECTOMY     With pelvic  floor repair    Current Medications: Outpatient Medications Prior to Visit  Medication Sig Dispense Refill   acetaminophen  (TYLENOL ) 500 MG tablet Take 500 mg by mouth 3 (three) times daily.     ALPRAZolam  (XANAX ) 0.25 MG tablet TAKE 1 TABLET BY MOUTH AT BEDTIME AS NEEDED 30 tablet 2   cephALEXin (KEFLEX) 250 MG capsule Take 250 mg by mouth daily.     diclofenac  Sodium (VOLTAREN ) 1 % GEL Apply 2 g topically 4 (four)  times daily.     Docusate Sodium  100 MG capsule Take 200 mg by mouth daily as needed for mild constipation. Takes 300 mg  as needed     escitalopram  (LEXAPRO ) 20 MG tablet Take 10-20 mg by mouth every morning. Hot flashes     estradiol (ESTRACE) 0.1 MG/GM vaginal cream Place 2 g vaginally 2 (two) times a week. USE TWICE WEEKLY     Glucosamine Sulfate 1000 MG CAPS      hydrocortisone  2.5 % cream Proctosol HC 2.5 % rectal cream with applicator     levothyroxine  (SYNTHROID , LEVOTHROID) 50 MCG tablet Take 50 mcg by mouth daily before breakfast.     lubiprostone (AMITIZA) 8 MCG capsule Take 8 mcg by mouth 2 (two) times daily with a meal.     Melatonin 10 MG TABS Take 1 tablet by mouth at bedtime.     Peppermint Oil 50 MG CAPS Take 50 mg by mouth daily.     simethicone  (MYLICON) 80 MG chewable tablet Chew 1 tablet (80 mg total) by mouth every 6 (six) hours as needed for flatulence (bloating). 30 tablet 0   SUMAtriptan  (IMITREX ) 100 MG tablet TAKE 1 TABLET BY MOUTH AS NEEDED ONE TIME ONCE A DAY 9 tablet 4   traMADol  (ULTRAM ) 50 MG tablet as needed.     valsartan  (DIOVAN ) 160 MG tablet TAKE 1/2 TABLET BY MOUTH TWICE A DAY 90 tablet 1   ezetimibe  (ZETIA ) 10 MG tablet TAKE 1 TABLET BY MOUTH DAILY 90 tablet 1   spironolactone  (ALDACTONE ) 25 MG tablet TAKE ONE TABLET BY MOUTH DAILY.  IF YOU HAVE ANY SWELLING IN THE AFTERNOON, TAKE 1/2 TABLET BY MOUTH AS NEEDED 90 tablet 0   pravastatin  (PRAVACHOL ) 40 MG tablet TAKE 1/2 TABLET BY MOUTH EVERY EVENING 120 tablet 2   No facility-administered medications prior to visit.     Allergies:   Midol [aspirin-cinnamedrine-caffeine ], Olmesartan , Talwin [pentazocine], Bisoprolol, and Naproxen   Social History   Socioeconomic History   Marital status: Married    Spouse name: Not on file   Number of children: Not on file   Years of education: Not on file   Highest education level: Not on file  Occupational History   Not on file  Tobacco Use   Smoking status:  Never    Passive exposure: Never   Smokeless tobacco: Never  Vaping Use   Vaping status: Never Used  Substance and Sexual Activity   Alcohol use: No    Alcohol/week: 0.0 standard drinks of alcohol   Drug use: No   Sexual activity: Not on file  Other Topics Concern   Not on file  Social History Narrative   Not on file   Social Drivers of Health   Financial Resource Strain: Not on file  Food Insecurity: Not on file  Transportation Needs: Not on file  Physical Activity: Not on file  Stress: Not on file  Social Connections: Not on file    She had worked as a Teacher, 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  No tobacco history.  She is fully retired.  Family History:  The patient's family history includes Breast cancer in her mother; Heart attack in her father; Heart disease in her brother and father; Hyperlipidemia in her maternal grandmother; Hypertension in her brother, maternal grandmother, and son; Migraines in her daughter; Ovarian cancer in her mother; Polymyalgia rheumatica in her paternal aunt; Raynaud syndrome in her daughter; Rheum arthritis in her mother.   ROS General: Negative; No fevers, chills, or night sweats; increasing fatigue HEENT: Transient vision change negative; No changes  or hearing, sinus congestion, difficulty swallowing Pulmonary: Negative; No cough, wheezing, shortness of breath, hemoptysis Cardiovascular: See history of present illness, no chest pain, PND, orthopnea. GI: Positive for large hiatal hernia; is post [employer] fundoplication surgery GU: s/p elective hysterectomy and pelvic floor reconstruction Musculoskeletal: Positive for right knee discomfort wearing a right knee brace Hematologic/Oncology: Negative; no easy bruising, bleeding Rheumatology: Positive ANA Endocrine: Negative; no heat/cold intolerance; no diabetes Neuro: Negative; no changes in balance, headaches Skin: Negative; No rashes or skin lesions Psychiatric: Negative; No  behavioral problems, depression Sleep: Positive for OSA, now with a customized oral appliance with mandibular advancement; she has not used consistently over the past year due to issues from losing a tooth and requiring implant  Other comprehensive 14 point system review is negative.   PHYSICAL EXAM:   VS:  BP 124/82   Pulse (!) 59   Ht 5\' 2"  (1.575 m)   Wt 160 lb 9.6 oz (72.8 kg)   SpO2 97%   BMI 29.37 kg/m     Repeat blood pressure by me was 120/80.  Wt Readings from Last 3 Encounters:  07/23/23 160 lb 9.6 oz (72.8 kg)  01/21/23 164 lb 6.4 oz (74.6 kg)  10/07/22 161 lb 4.8 oz (73.2 kg)    General: Alert, oriented, no distress.  Skin: normal turgor, no rashes, warm and dry HEENT: Normocephalic, atraumatic. Pupils equal round and reactive to light; sclera anicteric; extraocular muscles intact; Nose without nasal septal hypertrophy Mouth/Parynx benign; Mallinpatti scale 3 Neck: No JVD, no carotid bruits; normal carotid upstroke Lungs: clear to ausculatation and percussion; no wheezing or rales Chest wall: without tenderness to palpitation Heart: PMI not displaced, RRR, s1 s2 normal, 1/6 systolic murmur, no diastolic murmur, no rubs, gallops, thrills, or heaves Abdomen: soft, nontender; no hepatosplenomehaly, BS+; abdominal aorta nontender and not dilated by palpation. Back: no CVA tenderness Pulses 2+ Musculoskeletal: Osteoarthritis of fingers, knees and shoulder; full range of motion, normal strength,  Extremities: no clubbing cyanosis or edema, Homan's sign negative  Neurologic: grossly nonfocal; Cranial nerves grossly wnl Psychologic: Normal mood and affect    Studies/Labs Reviewed:   EKG Interpretation Date/Time:  Wednesday Jul 23 2023 11:27:29 EDT Ventricular Rate:  59 PR Interval:  180 QRS Duration:  76 QT Interval:  396 QTC Calculation: 392 R Axis:   -22  Text Interpretation: Sinus bradycardia Septal infarct (cited on or before 21-Jan-2023) When compared with  ECG of 21-Jan-2023 10:37, No significant change was found Confirmed by Magnus Schuller ([id number]) on 07/23/2023 12:19:25 PM    January 21, 2023 ECG (independently read by me): Sinus bradycardia at 59  May 29, 2022 ECG (independently read by me): NSR at 66, PRWP V1-4  June 25, 2021 ECG (independently read by me):  NSR at 60, no ectopy  January 25, 2021 ECG (independently read by me):  NSR at 60, no ectopy  April 17, 2020 ECG (independently  read by me): Sinus Bradycardia at 59; no ectopy, normal intervals  August 30, 2019 ECG (independently read by me): Sinus bradycardia 55 bpm.  No ectopy.  Normal intervals.  March 02, 2019 ECG (independently read by me): Normal sinus rhythm at 65 bpm.  No ectopy.  Normal intervals.  No ST segment changes.    September 2020 ECG (independently read by me): Normal sinus rhythm at 61 bpm.  Normal intervals.  No ectopy.  March 2020 ECG (independently read by me): Normal sinus rhythm at 69 bpm.  Poor anterior R wave progression V1 through V3.  December 08, 2017 ECG (independently read by me): NSR at 66; normal intervals  04/21/2017 ECG (independently read by me): Normal sinus rhythm at 66 bpm.  No ST segment changes.  Normal intervals.  No ectopy.  Very mild RV conduction delay.  Recent Labs:    Latest Ref Rng & Units 08/02/2021    2:02 PM 06/18/2019    1:55 PM 11/23/2018    9:18 AM  BMP  Glucose 65 - 99 mg/dL 82  71  80   BUN 7 - 25 mg/dL 18  21  13    Creatinine 0.50 - 1.05 mg/dL 1.61  0.96  0.45   BUN/Creat Ratio 6 - 22 (calc) NOT APPLICABLE  NOT APPLICABLE  14   Sodium 135 - 146 mmol/L 135  136  137   Potassium 3.5 - 5.3 mmol/L 4.6  4.5  4.6   Chloride 98 - 110 mmol/L 100  103  103   CO2 20 - 32 mmol/L 26  25  20    Calcium  8.6 - 10.4 mg/dL 9.5  9.3  9.4         Latest Ref Rng & Units 08/02/2021    2:02 PM 06/18/2019    1:55 PM 11/23/2018    9:18 AM  Hepatic Function  Total Protein 6.1 - 8.1 g/dL 6.7  6.5  6.6   Albumin 3.8 - 4.8 g/dL   4.0    AST 10 - 35 U/L 26  17  21    ALT 6 - 29 U/L 71  21  23   Alk Phosphatase 39 - 117 IU/L   71   Total Bilirubin 0.2 - 1.2 mg/dL 0.5  0.7  0.5        Latest Ref Rng & Units 08/02/2021    2:02 PM 06/18/2019    1:55 PM 11/23/2018    9:18 AM  CBC  WBC 3.8 - 10.8 Thousand/uL 9.3  5.8  5.0   Hemoglobin 11.7 - 15.5 g/dL 40.9  81.1  91.4   Hematocrit 35.0 - 45.0 % 44.2  41.0  40.1   Platelets 140 - 400 Thousand/uL 290  248  273    Lab Results  Component Value Date   MCV 97.1 08/02/2021   MCV 97.9 06/18/2019   MCV 97 11/23/2018   Lab Results  Component Value Date   TSH 1.830 11/23/2018   No results found for: "HGBA1C"   BNP    Component Value Date/Time   BNP 30.6 11/23/2018 0918    ProBNP No results found for: "PROBNP"   Lipid Panel     Component Value Date/Time   CHOL 147 09/09/2019 0951   TRIG 135 09/09/2019 0951   HDL 57 09/09/2019 0951   CHOLHDL 2.6 09/09/2019 0951   LDLCALC 67 09/09/2019 0951     RADIOLOGY: No results found.   Additional studies/ records that were reviewed today include:  I reviewed  the patient's prior evaluation at the [facility] in 2014.  following a sleep study.  I reviewed her imaging studies, and prior evaluation by Dr. Waymond Hailey.  I reviewed her rheumatology evaluation from April 13, 2019   ASSESSMENT:    1. Essential hypertension   2. Grade I diastolic dysfunction   3. Family history of coronary artery disease in father   25. Hypothyroidism, unspecified type   5. OSA (obstructive sleep apnea)   6. Large hiatal hernia s/p [employer] plication     PLAN:  Ms. 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  Remotely she had been on losartan  for hypertension and pravastatin  for hyperlipidemia.  At prior office visits her blood pressure was controlled on her regimen consisting of candesartan  16 mg  and spironolactone  12.5 mg daily.  Remotely she had issues with myalgias on atorvastatin .  She has been able to tolerate Zetia  10 mg and pravastatin  20 mg with prior lipid studies showing LDL at 51.  An echo Doppler study in June 2020 showed an EF of with grade 2 diastolic dysfunction, mild AR and mild MR.  She developed COVID in August 2022 and was ill for approximately a month.  Subsequent echo due to shortness of breath in December 2022 continued to show normal LV function.  Diastolic dysfunction had improved and was now grade 1 and there was evidence for mild aortic sclerosis without stenosis.  At a subsequent office visit, she was taken off candesartan  due to cost issues and was on valsartan  80 mg twice a day.  She also is on spironolactone  and now takes is 12.5 mg daily.  There is no edema.  Last year she had issues with losing a tooth requiring dental implant and as result she was unable to use her oral appliance for close to a year.  Subsequently, she was experiencing more fatigability and was often sleeping in a recliner due to hip pain.  She has continued to have arthritic issues involving her knees, hands feet and neck.  She is followed by rheumatology as well as orthopedics.  She has seen Dr. Alvira Josephs for elevated ANA and undergoes continued surveillance for potential autoimmune disease.  Blood pressure today is stable on current therapy.  Laboratory November 2024 showed total cholesterol 174 with LDL at 92.  I have recommended titration of pravastatin  to 40 mg from her current dose of 20 mg and she continues to be on Zetia  10 mg.  I have recommended she undergo coronary calcium  score for assessment of coronary calcification.  She is aware of my upcoming retirement.  I will transition her to the care of Dr. Sheryle Donning at our [place] office in 6 months or sooner as needed.   Medication Adjustments/Labs and Tests Ordered: Current medicines are reviewed at length with the patient today.   Concerns regarding medicines are outlined above.  Medication changes, Labs and Tests ordered today are listed in the Patient Instructions below. Patient Instructions    Testing/Procedures:  CORONARY CALCIUM  SCORING CT SCAN AT THE [facility]  Follow-Up: At [facility], you and your health needs are our priority.  As part of our continuing mission to provide you with exceptional heart care, our providers are all part of one team.  This team includes your primary Cardiologist (physician) and Advanced Practice Providers or APPs (Physician Assistants and Nurse Practitioners) who all work  together to provide you with the care you need, when you need it.  Your next appointment:   6 month(s)  Provider:   Sheryle Donning, MD           Signed, Magnus Schuller, MD  07/25/2023 5:56 PM    [facility] [address], [place], [place]  [address] Phone: [phone]

## 2023-07-23 NOTE — Patient Instructions (Signed)
   Testing/Procedures:  CORONARY CALCIUM  SCORING CT SCAN AT THE [facility]  Follow-Up: At [facility], you and your health needs are our priority.  As part of our continuing mission to provide you with exceptional heart care, our providers are all part of one team.  This team includes your primary Cardiologist (physician) and Advanced Practice Providers or APPs (Physician Assistants and Nurse Practitioners) who all work together to provide you with the care you need, when you need it.  Your next appointment:   6 month(s)  Provider:   Sheryle Donning, MD

## 2023-07-24 DIAGNOSIS — M6281 Muscle weakness (generalized): Secondary | ICD-10-CM | POA: Diagnosis not present

## 2023-07-24 DIAGNOSIS — K5900 Constipation, unspecified: Secondary | ICD-10-CM | POA: Diagnosis not present

## 2023-07-24 DIAGNOSIS — N3946 Mixed incontinence: Secondary | ICD-10-CM | POA: Diagnosis not present

## 2023-07-25 ENCOUNTER — Encounter: Payer: Self-pay | Admitting: Cardiovascular Disease

## 2023-07-29 ENCOUNTER — Encounter: Payer: Self-pay | Admitting: Cardiovascular Disease

## 2023-07-29 DIAGNOSIS — N3020 Other chronic cystitis without hematuria: Secondary | ICD-10-CM | POA: Diagnosis not present

## 2023-07-29 MED ORDER — PRAVASTATIN SODIUM 40 MG PO TABS: 40.0000 mg | ORAL_TABLET | Freq: Every evening | ORAL | 3 refills | Status: AC

## 2023-07-30 ENCOUNTER — Ambulatory Visit (HOSPITAL_BASED_OUTPATIENT_CLINIC_OR_DEPARTMENT_OTHER)
Admission: RE | Admit: 2023-07-30 | Discharge: 2023-07-30 | Disposition: A | Discharge status: Home / Self Care | Payer: Self-pay | Source: Ambulatory Visit | Attending: Cardiovascular Disease | Admitting: Cardiovascular Disease

## 2023-07-30 DIAGNOSIS — Z8249 Family history of ischemic heart disease and other diseases of the circulatory system: Secondary | ICD-10-CM | POA: Insufficient documentation

## 2023-07-31 ENCOUNTER — Ambulatory Visit: Payer: Self-pay | Admitting: Cardiovascular Disease

## 2023-08-13 DIAGNOSIS — I10 Essential (primary) hypertension: Secondary | ICD-10-CM | POA: Diagnosis not present

## 2023-08-13 DIAGNOSIS — E559 Vitamin D deficiency, unspecified: Secondary | ICD-10-CM | POA: Diagnosis not present

## 2023-08-13 DIAGNOSIS — Z79899 Other long term (current) drug therapy: Secondary | ICD-10-CM | POA: Diagnosis not present

## 2023-08-13 DIAGNOSIS — E785 Hyperlipidemia, unspecified: Secondary | ICD-10-CM | POA: Diagnosis not present

## 2023-08-13 DIAGNOSIS — E039 Hypothyroidism, unspecified: Secondary | ICD-10-CM | POA: Diagnosis not present

## 2023-08-20 DIAGNOSIS — G43709 Chronic migraine without aura, not intractable, without status migrainosus: Secondary | ICD-10-CM | POA: Diagnosis not present

## 2023-08-20 DIAGNOSIS — E559 Vitamin D deficiency, unspecified: Secondary | ICD-10-CM | POA: Diagnosis not present

## 2023-08-20 DIAGNOSIS — E785 Hyperlipidemia, unspecified: Secondary | ICD-10-CM | POA: Diagnosis not present

## 2023-08-20 DIAGNOSIS — Z79899 Other long term (current) drug therapy: Secondary | ICD-10-CM | POA: Diagnosis not present

## 2023-08-20 DIAGNOSIS — E039 Hypothyroidism, unspecified: Secondary | ICD-10-CM | POA: Diagnosis not present

## 2023-08-20 DIAGNOSIS — I10 Essential (primary) hypertension: Secondary | ICD-10-CM | POA: Diagnosis not present

## 2023-08-20 DIAGNOSIS — K5900 Constipation, unspecified: Secondary | ICD-10-CM | POA: Diagnosis not present

## 2023-08-20 DIAGNOSIS — R7309 Other abnormal glucose: Secondary | ICD-10-CM | POA: Diagnosis not present

## 2023-09-15 DIAGNOSIS — K623 Rectal prolapse: Secondary | ICD-10-CM | POA: Diagnosis not present

## 2023-10-20 ENCOUNTER — Other Ambulatory Visit: Payer: Self-pay | Admitting: Obstetrics & Gynecology

## 2023-10-20 DIAGNOSIS — Z1231 Encounter for screening mammogram for malignant neoplasm of breast: Secondary | ICD-10-CM

## 2023-11-07 ENCOUNTER — Ambulatory Visit
Admission: RE | Admit: 2023-11-07 | Discharge: 2023-11-07 | Disposition: A | Discharge status: Home / Self Care | Source: Ambulatory Visit | Attending: Obstetrics & Gynecology | Admitting: Obstetrics & Gynecology

## 2023-11-07 DIAGNOSIS — Z1231 Encounter for screening mammogram for malignant neoplasm of breast: Secondary | ICD-10-CM

## 2023-12-09 ENCOUNTER — Telehealth: Payer: Self-pay | Admitting: Rheumatology

## 2023-12-09 DIAGNOSIS — K5900 Constipation, unspecified: Secondary | ICD-10-CM | POA: Diagnosis not present

## 2023-12-09 DIAGNOSIS — M5030 Other cervical disc degeneration, unspecified cervical region: Secondary | ICD-10-CM

## 2023-12-09 DIAGNOSIS — K623 Rectal prolapse: Secondary | ICD-10-CM | POA: Diagnosis not present

## 2023-12-09 DIAGNOSIS — R7989 Other specified abnormal findings of blood chemistry: Secondary | ICD-10-CM

## 2023-12-09 DIAGNOSIS — M7061 Trochanteric bursitis, right hip: Secondary | ICD-10-CM

## 2023-12-09 DIAGNOSIS — R7689 Other specified abnormal immunological findings in serum: Secondary | ICD-10-CM

## 2023-12-09 DIAGNOSIS — Z1211 Encounter for screening for malignant neoplasm of colon: Secondary | ICD-10-CM | POA: Diagnosis not present

## 2023-12-09 DIAGNOSIS — M170 Bilateral primary osteoarthritis of knee: Secondary | ICD-10-CM

## 2023-12-09 DIAGNOSIS — M1812 Unilateral primary osteoarthritis of first carpometacarpal joint, left hand: Secondary | ICD-10-CM

## 2023-12-09 NOTE — Telephone Encounter (Signed)
 Patient advised she should come in for labs about 2 weeks prior to her appointment to make sure we have results to discuss at her appointment. Patient expressed understanding.

## 2023-12-09 NOTE — Addendum Note (Signed)
 Addended by: CENA ALFONSO CROME on: 12/09/2023 12:31 PM   Modules accepted: Orders

## 2023-12-09 NOTE — Telephone Encounter (Signed)
 Patient would like to know when she is due for labs. Pt is schedule 04/08/23

## 2023-12-09 NOTE — Telephone Encounter (Signed)
 ANA, double-stranded DNA, C3-C4, UA, CBC, CMP, sed rate

## 2023-12-30 ENCOUNTER — Other Ambulatory Visit: Payer: Self-pay

## 2023-12-30 ENCOUNTER — Ambulatory Visit: Admitting: Orthopaedic Surgery

## 2023-12-30 ENCOUNTER — Telehealth: Payer: Self-pay

## 2023-12-30 ENCOUNTER — Encounter: Payer: Self-pay | Admitting: Orthopaedic Surgery

## 2023-12-30 DIAGNOSIS — M1711 Unilateral primary osteoarthritis, right knee: Secondary | ICD-10-CM

## 2023-12-30 MED ORDER — TRAMADOL HCL 50 MG PO TABS: 50.0000 mg | ORAL_TABLET | Freq: Every day | ORAL | 0 refills | Status: DC | PRN

## 2023-12-30 NOTE — Telephone Encounter (Signed)
 PCP & Cardiology clearance form given to patient to take to them. She is aware we will need this form back in order to schedule surgery. [facility] Questionnaire was filled out and placed on [employer].

## 2023-12-30 NOTE — Progress Notes (Signed)
 Office Visit Note   Patient: Jenny Bradford           Date of Birth: [birth year]           MRN: [id number] Visit Date: 12/30/2023              Requested by: Gerome Brunet, DO [address] [place],  [place] [address] PCP: Gerome Brunet, DO   Assessment & Plan: Visit Diagnoses:  1. Primary osteoarthritis of right knee     Plan: History of Present Illness Jenny Bradford is a 73 year old female with right knee osteoarthritis who presents with worsening knee pain.  She experiences significant pain in her right knee, particularly on the medial side, which limits daily activities such as using stairs and rising from the floor. She previously received a gel injection with minimal relief and significant pain during the procedure. She is open to trying injections again if they can be made more comfortable. Her current pain management includes taking four to six Tylenol  daily. She is limited in her use of ibuprofen due to swelling in her feet, which she can only take occasionally when the pain is severe. Her last x-ray was taken ten months ago. She is interested in traveling and engaging in activities with her grandchildren, which her current knee pain limits. She reports swelling in her feet when taking ibuprofen and has a history of elevated ANA levels.  Exam of the right knee shows medial joint line tenderness.  Collaterals and cruciates are stable.  Baseline range of motion.  No joint effusion.  Results LABS ANA: Elevated  Assessment and Plan Right knee osteoarthritis with chronic right knee pain Chronic pain due to medial osteoarthritis with bone-on-bone contact. Previous gel injection ineffective; cortisone injections provide temporary relief. Knee replacement discussed as definitive solution with expected significant improvement by one year. Risks include initial pain, residual discomfort, and blood clots. Consideration of nickel-free implant due to potential connective  tissue issues and ANA levels. - Discuss knee replacement as the most definitive solution. - Consider cortisone injections for temporary relief. - Order new x-rays. - Provide clearance sheet for primary care physician. - Renew tramadol  prescription. - Consider nickel-free total knee implant if surgery is pursued.  Impression is severe right knee degenerative joint disease secondary to Osteoarthritis.  Patient has attempted conservative treatment for at least 6 consecutive weeks within the past 12 weeks, including but not limited to physical therapy, home exercise program, NSAIDs, activity modification, and/or corticosteroid injections. Despite these efforts, symptoms have not improved or have worsened. Conservative measures have been deemed unsuccessful at this time. After a detailed discussion covering diagnosis and treatment options--including the risks, benefits, alternatives, and potential complications of surgical and nonsurgical management--the patient elected to proceed with surgery  Anticoagulants: No antithrombotic Postop anticoagulation: Eliquis Diabetic: No  Nickel allergy: will use nickel free implant Prior DVT/PE: No Tobacco use: No Clearances needed for surgery: PCP Anticipated discharge dispo: Home   Follow-Up Instructions: No follow-ups on file.   Orders:  Orders Placed This Encounter  Procedures   XR KNEE 3 VIEW RIGHT   Meds ordered this encounter  Medications   traMADol  (ULTRAM ) 50 MG tablet    Sig: Take 1-2 tablets (50-100 mg total) by mouth daily as needed.    Dispense:  20 tablet    Refill:  0      Procedures: No procedures performed   Clinical Data: No additional findings.   Subjective: Chief Complaint  Patient  presents with   Right Knee - Pain    HPI  Review of Systems  Constitutional: Negative.   HENT: Negative.    Eyes: Negative.   Respiratory: Negative.    Cardiovascular: Negative.   Endocrine: Negative.   Musculoskeletal: Negative.    Neurological: Negative.   Hematological: Negative.   Psychiatric/Behavioral: Negative.    All other systems reviewed and are negative.    Objective: Vital Signs: There were no vitals taken for this visit.  Physical Exam Vitals and nursing note reviewed.  Constitutional:      Appearance: She is well-developed.  HENT:     Head: Atraumatic.     Nose: Nose normal.  Eyes:     Extraocular Movements: Extraocular movements intact.  Cardiovascular:     Pulses: Normal pulses.  Pulmonary:     Effort: Pulmonary effort is normal.  Abdominal:     Palpations: Abdomen is soft.  Musculoskeletal:     Cervical back: Neck supple.  Skin:    General: Skin is warm.     Capillary Refill: Capillary refill takes less than 2 seconds.  Neurological:     Mental Status: She is alert. Mental status is at baseline.  Psychiatric:        Behavior: Behavior normal.        Thought Content: Thought content normal.        Judgment: Judgment normal.     Ortho Exam  Specialty Comments:  No specialty comments available.  Imaging: XR KNEE 3 VIEW RIGHT Result Date: 12/30/2023 X-rays demonstrate severe tricompartmental osteoarthritis.  Bone-on-bone joint space narrowing of medial compartment.  Kellgren-Lawrence stage IV    PMFS History: Patient Active Problem List   Diagnosis Date Noted   Arthritis of right midfoot 02/27/2023   Bilateral hip pain 01/02/2021   Primary osteoarthritis of left knee 01/26/2019   Primary osteoarthritis of right knee 01/26/2019   Palpitations 09/04/2017   Family history of coronary artery disease in father 09/04/2017   Paraesophageal hernia 05/13/2017   Neck pain 01/03/2017   Large hiatal hernia 12/28/2016   Multiple pulmonary nodules determined by computed tomography of lung 12/27/2016   Arthritis of carpometacarpal [facility]) joint of left thumb 05/28/2016   OSA (obstructive sleep apnea) 10/05/2012   HTN (hypertension) 10/05/2012   Hyperlipidemia 10/05/2012    Migraine headache 10/05/2012   Past Medical History:  Diagnosis Date   AAA (abdominal aortic aneurysm) 08/2014   scanning every 2 years   Arthritis    oa   Complication of anesthesia    slow to awaken in past   Family history of anesthesia complication    slow to awaken   GERD (gastroesophageal reflux disease)    Grade II diastolic dysfunction 04/2017   Noted on ECHO   H/O hiatal hernia    Headache(784.0)    migraines (rare)   Hemorrhoids    Hyperlipidemia    Hypertension 04/01/11   ECHO-EF>55% NUC STRESS TEST- 05/01/12   Hypothyroidism    Mild aortic valve regurgitation 04/2017   Noted on ECHO   Mitral valve regurgitation 04/2017   Mild: Noted on ECHO   Pulmonary nodules 2018   scanning every 6 months   Sleep apnea 05/01/12 & 05/29/12   SLEEP STUDY-[facility], NO CPAP USED SINCE MAY 2015 Uses oral device    Family History  Problem Relation Age of Onset   Breast cancer Mother    Ovarian cancer Mother    Rheum arthritis Mother    Heart attack  Father    Heart disease Father    Hypertension Brother    Heart disease Brother    Hyperlipidemia Maternal Grandmother    Hypertension Maternal Grandmother    Polymyalgia rheumatica Paternal Aunt    Hypertension Son    Migraines Daughter    Raynaud syndrome Daughter     Past Surgical History:  Procedure Laterality Date   ANKLE SURGERY Right 1980   CARPOMETACARPEL SUSPENSION PLASTY Right 12/14/2014   Procedure: RIGHT THUMB CARPOMETACARPAL (CMC) ARTHROPLASTY;  Surgeon: Kay CHRISTELLA Cummins, MD;  Location: [facility];  Service: Orthopedics;  Laterality: Right;   CARPOMETACARPEL SUSPENSION PLASTY Left 03/19/2017   Procedure: Left Thumb Ligament Reconstruction and Tendon Interposition;  Surgeon: Cummins Kay CHRISTELLA, MD;  Location: [facility];  Service: Orthopedics;  Laterality: Left;   COLONOSCOPY WITH PROPOFOL  N/A 12/07/2013   Procedure: COLONOSCOPY WITH PROPOFOL ;  Surgeon: Gladis MARLA Louder, MD;   Location: [facility];  Service: Endoscopy;  Laterality: N/A;   CYSTECTOMY  1975   FOOT SURGERY Right 12/01/2012   FOOT SURGERY Left 12/10/2011   HEMORRHOID SURGERY  2005   HERNIA REPAIR  2019   Miscarriage  1983   TONSILLECTOMY  1964   VAGINAL HYSTERECTOMY     With pelvic floor repair   Social History   Occupational History   Not on file  Tobacco Use   Smoking status: Never    Passive exposure: Never   Smokeless tobacco: Never  Vaping Use   Vaping status: Never Used  Substance and Sexual Activity   Alcohol use: No    Alcohol/week: 0.0 standard drinks of alcohol   Drug use: No   Sexual activity: Not on file

## 2024-01-04 DIAGNOSIS — Z23 Encounter for immunization: Secondary | ICD-10-CM | POA: Diagnosis not present

## 2024-01-07 DIAGNOSIS — E785 Hyperlipidemia, unspecified: Secondary | ICD-10-CM | POA: Diagnosis not present

## 2024-01-07 DIAGNOSIS — M8589 Other specified disorders of bone density and structure, multiple sites: Secondary | ICD-10-CM | POA: Diagnosis not present

## 2024-01-07 DIAGNOSIS — E559 Vitamin D deficiency, unspecified: Secondary | ICD-10-CM | POA: Diagnosis not present

## 2024-01-07 DIAGNOSIS — R7309 Other abnormal glucose: Secondary | ICD-10-CM | POA: Diagnosis not present

## 2024-01-07 DIAGNOSIS — E7800 Pure hypercholesterolemia, unspecified: Secondary | ICD-10-CM | POA: Diagnosis not present

## 2024-01-07 DIAGNOSIS — I10 Essential (primary) hypertension: Secondary | ICD-10-CM | POA: Diagnosis not present

## 2024-01-07 DIAGNOSIS — E039 Hypothyroidism, unspecified: Secondary | ICD-10-CM | POA: Diagnosis not present

## 2024-01-08 LAB — HEMOGLOBIN A1C: A1c: 5.3

## 2024-01-09 LAB — COMPREHENSIVE METABOLIC PANEL WITH GFR: EGFR: 73

## 2024-01-12 ENCOUNTER — Encounter: Payer: Self-pay | Admitting: Radiology

## 2024-01-16 ENCOUNTER — Encounter (HOSPITAL_BASED_OUTPATIENT_CLINIC_OR_DEPARTMENT_OTHER): Payer: Self-pay

## 2024-01-19 ENCOUNTER — Encounter (HOSPITAL_BASED_OUTPATIENT_CLINIC_OR_DEPARTMENT_OTHER): Payer: Self-pay | Admitting: Cardiology

## 2024-01-19 ENCOUNTER — Telehealth (HOSPITAL_BASED_OUTPATIENT_CLINIC_OR_DEPARTMENT_OTHER): Payer: Self-pay

## 2024-01-19 ENCOUNTER — Ambulatory Visit (INDEPENDENT_AMBULATORY_CARE_PROVIDER_SITE_OTHER): Admitting: Cardiology

## 2024-01-19 VITALS — BP 112/74 | HR 63 | Ht 62.0 in | Wt 164.1 lb

## 2024-01-19 DIAGNOSIS — Z01810 Encounter for preprocedural cardiovascular examination: Secondary | ICD-10-CM

## 2024-01-19 DIAGNOSIS — Z7189 Other specified counseling: Secondary | ICD-10-CM | POA: Diagnosis not present

## 2024-01-19 DIAGNOSIS — I10 Essential (primary) hypertension: Secondary | ICD-10-CM | POA: Diagnosis not present

## 2024-01-19 DIAGNOSIS — E7800 Pure hypercholesterolemia, unspecified: Secondary | ICD-10-CM | POA: Diagnosis not present

## 2024-01-19 DIAGNOSIS — I2510 Atherosclerotic heart disease of native coronary artery without angina pectoris: Secondary | ICD-10-CM | POA: Diagnosis not present

## 2024-01-19 NOTE — Patient Instructions (Signed)

## 2024-01-19 NOTE — Progress Notes (Signed)
 Cardiology Office Note:  .   Date:  01/19/2024  ID:  Jenny Bradford, DOB [birth year], MRN [id number] PCP: Gerome Brunet, DO  [facility] Providers Cardiologist:  Shelda Bruckner, MD {  History of Present Illness: .   Jenny Bradford is a 73 y.o. female with PMH hypertension, hyperlipidemia, OSA with oral appliance. She was previously followed by Dr. Burnard and established care with me on 01/19/24. She is a retired teacher, early years/pre.   Today: This is my first visit with the patient. She is pending total knee arthroplasty with Dr. Jerri, date TBD. Type of anesthesia unknown. She can walk 1-1.5 miles at a time but is limited by knee pain. We did her preop risk stratification, see below. No history of MI or stroke, no history of heart failure. Only anesthesia issue she has had in the past was that she was slow to wake up.  ROS: Denies chest pain, shortness of breath at rest or with normal exertion. No PND, orthopnea, LE edema or unexpected weight gain. No syncope or palpitations. ROS otherwise negative except as noted.   Studies Reviewed: SABRA    EKG:       Physical Exam:   VS:  BP 112/74   Pulse 63   Ht 5' 2 (1.575 m)   Wt 164 lb 1.6 oz (74.4 kg)   SpO2 98%   BMI 30.01 kg/m    Wt Readings from Last 3 Encounters:  01/19/24 164 lb 1.6 oz (74.4 kg)  07/23/23 160 lb 9.6 oz (72.8 kg)  01/21/23 164 lb 6.4 oz (74.6 kg)    GEN: Well nourished, well developed in no acute distress HEENT: Normal, moist mucous membranes NECK: No JVD CARDIAC: regular rhythm, normal S1 and S2, no rubs or gallops. No murmur appreciated. VASCULAR: Radial and DP pulses 2+ bilaterally. No carotid bruits RESPIRATORY:  Clear to auscultation without rales, wheezing or rhonchi  ABDOMEN: Soft, non-tender, non-distended MUSCULOSKELETAL:  Ambulates independently SKIN: Warm and dry, no edema NEUROLOGIC:  Alert and oriented x 3. No focal neuro deficits noted. PSYCHIATRIC:  Normal affect    ASSESSMENT AND PLAN:  .    Preoperative cardiovascular evaluation According to the Revised Cardiac Risk Index (RCRI), her Perioperative Risk of Major Cardiac Event is (%): 0.4  Her Functional Capacity in METs is: 6.61 according to the Duke Activity Status Index (DASI).  The patient is not currently having active cardiac symptoms, and they can achieve >4 METs of activity.  According to ACC/AHA Guidelines, no further testing is needed.  Proceed with surgery at acceptable risk.  Our service is available as needed in the peri-operative period.     Hypertension -well controlled -continue spironolactone , valsartan   Hyperlipidemia Nonobstructive CAD based on calcium  score -Ca score 153 07/30/23 but she was already on medication at the time of the test, we discussed how this was historically recommended to be used (ie for decision making on whether to start statin, not studied once therapy has already been started).  -continue ezetimibe , pravastatin  -lipids from PCP show LDL 69, at goal of <70 -had severe statin myalgia on atorvastatin  -not on aspirin, reasonable given age and calcium  score  CV risk counseling and prevention -recommend heart healthy/Mediterranean diet, with whole grains, fruits, vegetable, fish, lean meats, nuts, and olive oil. Limit salt. -recommend moderate walking, 3-5 times/week for 30-50 minutes each session. Aim for at least 150 minutes/week. Goal should be pace of 3 miles/hours, or walking 1.5 miles in 30 minutes -recommend avoidance of tobacco products.  Avoid excess alcohol.  Dispo: 1 year or sooner as needed  Signed, Shelda Bruckner, MD   Shelda Bruckner, MD, PhD, [facility] [place]  [facility]  [place]  [facility] at [facility] at [facility] [address] [place], [place] [address] [phone]

## 2024-01-19 NOTE — Telephone Encounter (Signed)
 Patient seen in office today by Dr. Lonni, with preoperative recommendations given in her note. Routed note to requesting surgeon's office. Will remove from preoperative pool.

## 2024-01-19 NOTE — Telephone Encounter (Signed)
   Pre-operative Risk Assessment    Patient Name: Jenny Bradford  DOB: 14-Feb-1951 MRN: [id number]   Date of last office visit: 07/23/23 with Dr. Burnard  Date of next office visit: 01/19/24 with Dr. Lonni   Request for Surgical Clearance    Procedure:  Right Total Knee arthroplasty  Date of Surgery:  Clearance TBD                                 Surgeon:  Kay Ozell Cummins, MD Surgeon's Group or Practice Name:  [facility] Phone number:  [phone] Fax number:  [phone]   Type of Clearance Requested:   - Medical    Type of Anesthesia:  Not Indicated   Additional requests/questions:    Bonney Augustin JONETTA Delores   01/19/2024, 11:20 AM

## 2024-01-22 DIAGNOSIS — K623 Rectal prolapse: Secondary | ICD-10-CM | POA: Diagnosis not present

## 2024-01-26 ENCOUNTER — Other Ambulatory Visit: Payer: Self-pay

## 2024-01-28 DIAGNOSIS — N958 Other specified menopausal and perimenopausal disorders: Secondary | ICD-10-CM | POA: Diagnosis not present

## 2024-01-28 DIAGNOSIS — R350 Frequency of micturition: Secondary | ICD-10-CM | POA: Diagnosis not present

## 2024-01-28 DIAGNOSIS — N3020 Other chronic cystitis without hematuria: Secondary | ICD-10-CM | POA: Diagnosis not present

## 2024-01-28 DIAGNOSIS — R3915 Urgency of urination: Secondary | ICD-10-CM | POA: Diagnosis not present

## 2024-01-29 MED ORDER — EZETIMIBE 10 MG PO TABS: 10.0000 mg | ORAL_TABLET | Freq: Every day | ORAL | 3 refills | Status: AC

## 2024-02-11 DIAGNOSIS — K5900 Constipation, unspecified: Secondary | ICD-10-CM | POA: Diagnosis not present

## 2024-02-11 DIAGNOSIS — E785 Hyperlipidemia, unspecified: Secondary | ICD-10-CM | POA: Diagnosis not present

## 2024-02-11 DIAGNOSIS — Z0000 Encounter for general adult medical examination without abnormal findings: Secondary | ICD-10-CM | POA: Diagnosis not present

## 2024-02-11 DIAGNOSIS — E039 Hypothyroidism, unspecified: Secondary | ICD-10-CM | POA: Diagnosis not present

## 2024-02-11 DIAGNOSIS — G43709 Chronic migraine without aura, not intractable, without status migrainosus: Secondary | ICD-10-CM | POA: Diagnosis not present

## 2024-02-11 DIAGNOSIS — E559 Vitamin D deficiency, unspecified: Secondary | ICD-10-CM | POA: Diagnosis not present

## 2024-02-11 DIAGNOSIS — I10 Essential (primary) hypertension: Secondary | ICD-10-CM | POA: Diagnosis not present

## 2024-03-10 ENCOUNTER — Other Ambulatory Visit: Payer: Self-pay | Admitting: Physician Assistant

## 2024-03-10 MED ORDER — METHOCARBAMOL 500 MG PO TABS: 500.0000 mg | ORAL_TABLET | Freq: Two times a day (BID) | ORAL | 2 refills | Status: DC | PRN

## 2024-03-10 MED ORDER — OXYCODONE-ACETAMINOPHEN 5-325 MG PO TABS: 1.0000 | ORAL_TABLET | Freq: Three times a day (TID) | ORAL | 0 refills | Status: DC | PRN

## 2024-03-10 MED ORDER — DOCUSATE SODIUM 100 MG PO CAPS: 100.0000 mg | ORAL_CAPSULE | Freq: Every day | ORAL | 2 refills | Status: AC | PRN

## 2024-03-10 MED ORDER — ONDANSETRON HCL 4 MG PO TABS: 4.0000 mg | ORAL_TABLET | Freq: Three times a day (TID) | ORAL | 0 refills | Status: DC | PRN

## 2024-03-14 ENCOUNTER — Encounter: Payer: Self-pay | Admitting: Orthopaedic Surgery

## 2024-03-15 ENCOUNTER — Telehealth: Payer: Self-pay | Admitting: *Deleted

## 2024-03-15 NOTE — Telephone Encounter (Signed)
 Patient having a right total knee on 03/22/24 at [facility]. She has had a chronic UTI requiring antibiotics since a previous surgery and she and I discussed the possibility of not having a urinary catheter if possible. I know with a spinal this may make it more difficult, but wanted to make you aware and see what we could arrange for her. Thank you.

## 2024-03-15 NOTE — Pre-Procedure Instructions (Signed)
 Surgical Instructions   Your procedure is scheduled on March 23, 2023. Report to [facility] Main Entrance A at 0845 A.M., then check in with the Admitting office. Any questions or running late day of surgery: call [phone]  Questions prior to your surgery date: call [phone], Monday-Friday, 8am-4pm. If you experience any cold or flu symptoms such as cough, fever, chills, shortness of breath, etc. between now and your scheduled surgery, please notify us  at the above number.     Remember:  Do not eat after midnight the night before your surgery  You may drink clear liquids until 8:15 the morning of your surgery.   Clear liquids allowed are: Water, Non-Citrus Juices (without pulp), Carbonated Beverages, Clear Tea (no milk, honey, etc.), Black Coffee Only (NO MILK, CREAM OR POWDERED CREAMER of any kind), and Gatorade.   Patient Instructions  The night before surgery:  No food after midnight. ONLY clear liquids after midnight  The day of surgery (if you do NOT have diabetes):  Drink ONE (1) Pre-Surgery Clear Ensure by 8:15AM the morning of surgery. Drink in one sitting. Do not sip.  This drink was given to you during your hospital  pre-op appointment visit.  Nothing else to drink after completing the  Pre-Surgery Clear Ensure.          If you have questions, please contact your surgeons office.  Take these medicines the morning of surgery with A SIP OF WATER: acetaminophen  (TYLENOL )  cephALEXin (KEFLEX)  escitalopram  (LEXAPRO )  ezetimibe  (ZETIA )  levothyroxine  (SYNTHROID , LEVOTHROID)  May take these medicines IF NEEDED: traMADol  (ULTRAM )  ALPRAZolam  (XANAX )  methocarbamol  (ROBAXIN )  ondansetron  (ZOFRAN )   One week prior to surgery, STOP taking any Aspirin (unless otherwise instructed by your surgeon) Aleve, Naproxen, Ibuprofen, Motrin, Advil, Goody's, BC's, all herbal medications, fish oil, and non-prescription vitamins.                     Do NOT Smoke  (Tobacco/Vaping) for 24 hours prior to your procedure.  If you use a CPAP at night, you may bring your mask/headgear for your overnight stay.   You will be asked to remove any contacts, glasses, piercing's, hearing aid's, dentures/partials prior to surgery. Please bring cases for these items if needed.    Patients discharged the day of surgery will not be allowed to drive home, and someone needs to stay with them for 24 hours.  SURGICAL WAITING ROOM VISITATION Patients may have no more than 2 support people in the waiting area - these visitors may rotate.   Pre-op nurse will coordinate an appropriate time for 1 ADULT support person, who may not rotate, to accompany patient in pre-op.  Children under the age of 64 must have an adult with them who is not the patient and must remain in the main waiting area with an adult.  If the patient needs to stay at the hospital during part of their recovery, the visitor guidelines for inpatient rooms apply.  Please refer to the [facility] website for the visitor guidelines for any additional information.   If you received a COVID test during your pre-op visit  it is requested that you wear a mask when out in public, stay away from anyone that may not be feeling well and notify your surgeon if you develop symptoms. If you have been in contact with anyone that has tested positive in the last 10 days please notify you surgeon.      Pre-operative 4 CHG  Bathing Instructions   You can play a key role in reducing the risk of infection after surgery. Your skin needs to be as free of germs as possible. You can reduce the number of germs on your skin by washing with CHG (chlorhexidine  gluconate) soap before surgery. CHG is an antiseptic soap that kills germs and continues to kill germs even after washing.   DO NOT use if you have an allergy to chlorhexidine /CHG or antibacterial soaps. If your skin becomes reddened or irritated, stop using the CHG and notify one  of our RNs at [phone].   Please shower with the CHG soap starting 4 days before surgery using the following schedule:     Please keep in mind the following:  DO NOT shave, including legs and underarms, starting the day of your first shower.   You may shave your face at any point before/day of surgery.  Place clean sheets on your bed the day you start using CHG soap. Use a clean washcloth (not used since being washed) for each shower. DO NOT sleep with pets once you start using the CHG.   CHG Shower Instructions:  Wash your face and private area with normal soap. If you choose to wash your hair, wash first with your normal shampoo.  After you use shampoo/soap, rinse your hair and body thoroughly to remove shampoo/soap residue.  Turn the water OFF and apply  bottle of CHG soap to a CLEAN washcloth.  Apply CHG soap ONLY FROM YOUR NECK DOWN TO YOUR TOES (washing for 3-5 minutes)  DO NOT use CHG soap on face, private areas, open wounds, or sores.  Pay special attention to the area where your surgery is being performed.  If you are having back surgery, having someone wash your back for you may be helpful. Wait 2 minutes after CHG soap is applied, then you may rinse off the CHG soap.  Pat dry with a clean towel  Put on clean clothes/pajamas   If you choose to wear lotion, please use ONLY the CHG-compatible lotions that are listed below.  Additional instructions for the day of surgery:  If you choose, you may shower the morning of surgery with an antibacterial soap.  DO NOT APPLY any lotions, deodorants, cologne, or perfumes.   Do not bring valuables to the hospital. [facility] is not responsible for any belongings/valuables. Do not wear nail polish, gel polish, artificial nails, or any other type of covering on natural nails (fingers and toes) Do not wear jewelry or makeup Put on clean/comfortable clothes.  Please brush your teeth.  Ask your nurse before applying any prescription  medications to the skin.     CHG Compatible Lotions   Aveeno Moisturizing lotion  Cetaphil Moisturizing Cream  Cetaphil Moisturizing Lotion  Clairol Herbal Essence Moisturizing Lotion, Dry Skin  Clairol Herbal Essence Moisturizing Lotion, Extra Dry Skin  Clairol Herbal Essence Moisturizing Lotion, Normal Skin  Curel Age Defying Therapeutic Moisturizing Lotion with Alpha Hydroxy  Curel Extreme Care Body Lotion  Curel Soothing Hands Moisturizing Hand Lotion  Curel Therapeutic Moisturizing Cream, Fragrance-Free  Curel Therapeutic Moisturizing Lotion, Fragrance-Free  Curel Therapeutic Moisturizing Lotion, Original Formula  Eucerin Daily Replenishing Lotion  Eucerin Dry Skin Therapy Plus Alpha Hydroxy Crme  Eucerin Dry Skin Therapy Plus Alpha Hydroxy Lotion  Eucerin Original Crme  Eucerin Original Lotion  Eucerin Plus Crme Eucerin Plus Lotion  Eucerin TriLipid Replenishing Lotion  Keri Anti-Bacterial Hand Lotion  Keri Deep Conditioning Original Lotion Dry Skin Formula Softly  Scented  Keri Deep Conditioning Original Lotion, Fragrance Free Sensitive Skin Formula  Keri Lotion Fast Absorbing Fragrance Free Sensitive Skin Formula  Keri Lotion Fast Absorbing Softly Scented Dry Skin Formula  Keri Original Lotion  Keri Skin Renewal Lotion Keri Silky Smooth Lotion  Keri Silky Smooth Sensitive Skin Lotion  Nivea Body Creamy Conditioning Oil  Nivea Body Extra Enriched Lotion  Nivea Body Original Lotion  Nivea Body Sheer Moisturizing Lotion Nivea Crme  Nivea Skin Firming Lotion  NutraDerm 30 Skin Lotion  NutraDerm Skin Lotion  NutraDerm Therapeutic Skin Cream  NutraDerm Therapeutic Skin Lotion  ProShield Protective Hand Cream  Provon moisturizing lotion  Please read over the following fact sheets that you were given.

## 2024-03-15 NOTE — Care Plan (Signed)
 Ortho Bundle Case Management Note  Patient Details  Name: Jenny Bradford MRN: [id number] Date of Birth: [birth year]  [facility] call to patient to discuss her upcoming Right total knee arthroplasty with Dr. Jerri on 03/22/24 at [facility]. She is agreeable to case management. She lives with her spouse and will be planning on returning home at discharge with assistance from him. She does have stairs in her home to get to her bedroom. She will need a RW and may need a 3in1/BSC. Will also purchase on her own a tub transfer bench since she has a regular bathtub/shower combo. Anticipate HHPT will be needed after a short hospital stay. Referral made to [facility] after choice provided. Reviewed all post op care instructions and questions answered. Will continue to follow for needs.                  DME Arranged:  3-N-1, Walker rolling, CPM DME Agency:  [employer], [employer]  HH Arranged:  PT HH Agency:  [facility]  Additional Comments: Please contact me with any questions of if this plan should need to change.  Tylene Ned, RN, BSN, [employer]  [phone] 03/15/2024, 9:38 AM

## 2024-03-15 NOTE — Telephone Encounter (Signed)
 Ok, thanks.

## 2024-03-16 ENCOUNTER — Other Ambulatory Visit: Payer: Self-pay

## 2024-03-16 ENCOUNTER — Ambulatory Visit: Payer: Self-pay | Admitting: Orthopaedic Surgery

## 2024-03-16 ENCOUNTER — Encounter (HOSPITAL_COMMUNITY): Payer: Self-pay

## 2024-03-16 ENCOUNTER — Encounter (HOSPITAL_COMMUNITY)
Admission: RE | Admit: 2024-03-16 | Discharge: 2024-03-16 | Disposition: A | Discharge status: Home / Self Care | Source: Ambulatory Visit | Attending: Orthopaedic Surgery | Admitting: Orthopaedic Surgery

## 2024-03-16 VITALS — BP 114/74 | HR 69 | Temp 98.1°F | Resp 16 | Ht 61.5 in | Wt 159.0 lb

## 2024-03-16 DIAGNOSIS — I351 Nonrheumatic aortic (valve) insufficiency: Secondary | ICD-10-CM | POA: Diagnosis not present

## 2024-03-16 DIAGNOSIS — K219 Gastro-esophageal reflux disease without esophagitis: Secondary | ICD-10-CM | POA: Diagnosis not present

## 2024-03-16 DIAGNOSIS — Z01812 Encounter for preprocedural laboratory examination: Secondary | ICD-10-CM | POA: Insufficient documentation

## 2024-03-16 DIAGNOSIS — I119 Hypertensive heart disease without heart failure: Secondary | ICD-10-CM | POA: Diagnosis not present

## 2024-03-16 DIAGNOSIS — Z9889 Other specified postprocedural states: Secondary | ICD-10-CM | POA: Insufficient documentation

## 2024-03-16 DIAGNOSIS — M1711 Unilateral primary osteoarthritis, right knee: Secondary | ICD-10-CM | POA: Insufficient documentation

## 2024-03-16 DIAGNOSIS — E785 Hyperlipidemia, unspecified: Secondary | ICD-10-CM | POA: Insufficient documentation

## 2024-03-16 DIAGNOSIS — R918 Other nonspecific abnormal finding of lung field: Secondary | ICD-10-CM | POA: Diagnosis not present

## 2024-03-16 DIAGNOSIS — Z01818 Encounter for other preprocedural examination: Secondary | ICD-10-CM | POA: Diagnosis present

## 2024-03-16 DIAGNOSIS — G4733 Obstructive sleep apnea (adult) (pediatric): Secondary | ICD-10-CM | POA: Diagnosis not present

## 2024-03-16 LAB — BASIC METABOLIC PANEL WITH GFR
Anion gap: 8 (ref 5–15)
BUN: 16 mg/dL (ref 8–23)
CO2: 26 mmol/L (ref 22–32)
Calcium: 9.6 mg/dL (ref 8.9–10.3)
Chloride: 100 mmol/L (ref 98–111)
Creatinine, Ser: 0.82 mg/dL (ref 0.44–1.00)
GFR, Estimated: >60 mL/min
Glucose, Bld: 88 mg/dL (ref 70–99)
Potassium: 4.7 mmol/L (ref 3.5–5.1)
Sodium: 134 mmol/L — ABNORMAL LOW (ref 135–145)

## 2024-03-16 LAB — CBC
HCT: 43.3 % (ref 36.0–46.0)
Hemoglobin: 14.2 g/dL (ref 12.0–15.0)
MCH: 32.9 pg (ref 26.0–34.0)
MCHC: 32.8 g/dL (ref 30.0–36.0)
MCV: 100.2 fL — ABNORMAL HIGH (ref 80.0–100.0)
Platelets: 280 K/uL (ref 150–400)
RBC: 4.32 MIL/uL (ref 3.87–5.11)
RDW: 13 % (ref 11.5–15.5)
WBC: 8 K/uL (ref 4.0–10.5)
nRBC: 0 % (ref 0.0–0.2)

## 2024-03-16 LAB — SURGICAL PCR SCREEN
MRSA, PCR: POSITIVE — AB
Staphylococcus aureus: POSITIVE — AB

## 2024-03-16 NOTE — Progress Notes (Signed)
 Do you mind adding vanc please.  Thanks.

## 2024-03-17 ENCOUNTER — Other Ambulatory Visit: Payer: Self-pay | Admitting: Physician Assistant

## 2024-03-17 ENCOUNTER — Telehealth: Payer: Self-pay | Admitting: *Deleted

## 2024-03-17 MED ORDER — DOXYCYCLINE HYCLATE 100 MG PO TABS: 100.0000 mg | ORAL_TABLET | Freq: Two times a day (BID) | ORAL | 0 refills | Status: AC

## 2024-03-17 MED ORDER — MUPIROCIN 2 % EX OINT: 1.0000 | TOPICAL_OINTMENT | Freq: Two times a day (BID) | CUTANEOUS | 0 refills | Status: AC

## 2024-03-17 NOTE — Telephone Encounter (Signed)
 Patient called after her lab work yesterday and really would like to have something to take nasally before surgery. Could we send some Mupirocin  in for her to use prior to? Thank you.

## 2024-03-17 NOTE — Progress Notes (Signed)
 PCP - Lonell Collet DO Cardiologist - Bridgette Christopher,MD  PPM/ICD - denies Device Orders -  Rep Notified -   Chest x-ray -  EKG - 07/23/23 Stress Test - 05/05/12 ECHO - 06/10/22 Cardiac Cath -   Sleep Study - 05/01/12,05/29/12 CPAP - uses mouthpiece;no CPAP  Fasting Blood Sugar - na Checks Blood Sugar _____ times a day  Last dose of GLP1 agonist-  na GLP1 instructions:   Blood Thinner Instructions:na Aspirin Instructions:na  ERAS Protcol - Clears until 0815 PRE-SURGERY Ensure or G2- Ensue  COVID TEST- na   Anesthesia review: yes- cardiac clearance 01/19/24  Patient denies shortness of breath, fever, cough and chest pain at PAT appointment.   All instructions explained to the patient, with a verbal understanding of the material. Patient agrees to go over the instructions while at home for a better understanding. The opportunity to ask questions was provided.

## 2024-03-17 NOTE — Telephone Encounter (Signed)
 noted

## 2024-03-17 NOTE — Progress Notes (Signed)
 Anesthesia Chart Review:   Case: [id number] Date/Time: 03/22/24 1100   Procedure: ARTHROPLASTY, KNEE, TOTAL (Right: Knee)   Anesthesia type: Spinal   Diagnosis: Primary osteoarthritis of right knee [M17.11]   Pre-op diagnosis: osteoarthritis of right knee   [facility] ROOM 05 / [facility] OR   Surgeons: Jerri Kay HERO, MD       DISCUSSION: Patient is a 74 year old female scheduled for the above procedure.  History includes never smoker, post-operative N/V, HTN, HLD, diastolic dysfunction, valvular disease (mild AR, no MR 06/2022), GERD, hiatal hernia (s/p laparoscopic paraesophageal hernia repair, Nissen fundoplication 05/13/2017), hypothyroidism, pulmonary nodules, OSA (uses mouthpiece), migraines, osteoarthritis, hysterectomy (10/28/2019), prolonged emergence. No AAA noted on 09/2022 US . 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  She had an elevated CAC 153 (78th percentile) on 07/30/2023, but was already on statin therapy at that time. TTE in April 2024 showed LVEF 60-65%, no RWMA, grade 1 DD, mild AR. Preoperative cardiology evaluation outlined on 01/19/2024 visit with Dr. Lonni: According to the Revised Cardiac Risk Index (RCRI), her Perioperative Risk of Major Cardiac Event is (%): 0.4   Her Functional Capacity in METs is: 6.61 according to the Duke Activity Status Index (DASI).   The patient is not currently having active cardiac symptoms, and they can achieve >4 METs of activity.   According to ACC/AHA Guidelines, no further testing is needed.  Proceed with surgery at acceptable risk.  Our service is available as needed in the peri-operative period.     Anesthesia team to evaluate on the day of surgery.   VS: BP 114/74   Pulse 69   Temp 36.7 C   Resp 16   Ht 5' 1.5 (1.562 m)   Wt 72.1 kg   SpO2 99%   BMI 29.56 kg/m   PROVIDERS: Gerome Brunet, DO is PCP  Lonni Slain, MD is cardiologist Dolphus Reiter, MD is rheumatologist Alvia Craven, MD is  urologist  LABS: Labs reviewed: Acceptable for surgery.  A1c 5.3% on 01/08/2024. (all labs ordered are listed, but only abnormal results are displayed)  Labs Reviewed  SURGICAL PCR SCREEN - Abnormal; Notable for the following components:      Result Value   MRSA, PCR POSITIVE (*)    Staphylococcus aureus POSITIVE (*)    All other components within normal limits  CBC - Abnormal; Notable for the following components:   MCV 100.2 (*)    All other components within normal limits  BASIC METABOLIC PANEL WITH GFR - Abnormal; Notable for the following components:   Sodium 134 (*)    All other components within normal limits     IMAGES: CT chest (CAC CT over read) 07/30/2023: IMPRESSION: 1. Stable, likely benign 4 mm right middle lobe pulmonary nodule versus focal scar. 2. Mild, stable left lower lobe scarring.    EKG: 07/23/2023: Sinus bradycardia Septal infarct (cited on or before 21-Jan-2023) When compared with ECG of 21-Jan-2023 10:37, No significant change was found Confirmed by Burnard Ned ([id number]) on 07/23/2023 12:19:25 PM   CV: CT Cardiac Calcium  Scoring 07/30/2023: FINDINGS: Coronary arteries: Normal origins. Coronary Calcium  Score: Left main: 0 Left anterior descending artery: 70.3 Left circumflex artery: 82.3 Right coronary artery: 0 Total: 153 Percentile: 78th Pericardium: Normal. Ascending Aorta: Normal caliber.  Scattered calcifications.    IMPRESSION: - Coronary calcium  score of 153. This was 78th percentile for age-, race-, and sex-matched controls. RECOMMENDATIONS:... If CAC is >=100 or >=75th percentile, it is reasonable to initiate statin therapy at any  age. - Cardiology referral should be considered for patients with CAC scores >=400 or >=75th percentile...   US  abdominal aorta 10/07/2022: Summary:  Abdominal Aorta: No evidence of an abdominal aortic aneurysm was  visualized. The largest aortic measurement is 2.4 cm. The largest aortic  diameter remains  essentially unchanged compared to prior exam. Previous  diameter measurement was 2.5 cm obtained on  08/30/20.      Echo 06/10/2022: IMPRESSIONS   1. Left ventricular ejection fraction, by estimation, is 60 to 65%. The  left ventricle has normal function. The left ventricle has no regional  wall motion abnormalities. Left ventricular diastolic parameters are  consistent with Grade I diastolic  dysfunction (impaired relaxation).   2. Right ventricular systolic function is normal. The right ventricular  size is normal.   3. The mitral valve is normal in structure. No evidence of mitral valve  regurgitation. No evidence of mitral stenosis.   4. The aortic valve is tricuspid. There is mild calcification of the  aortic valve. Aortic valve regurgitation is mild. Aortic valve  sclerosis/calcification is present, without any evidence of aortic  stenosis.   5. The inferior vena cava is normal in size with greater than 50%  respiratory variability, suggesting right atrial pressure of 3 mmHg.  - Comparison(s): No significant change from prior study. Prior images  reviewed side by side.    US  carotid 01/12/2020: Summary:  - Right Carotid: Velocities in the right ICA are consistent with a 1-39% stenosis.  - Left Carotid: Velocities in the left ICA are consistent with a 1-39% stenosis.  - Vertebrals: Bilateral vertebral arteries demonstrate antegrade flow.  - Subclavians: Normal flow hemodynamics were seen in bilateral subclavian arteries.    Cardiac event monitor 09/16/2017: The patient was monitored for 14 days. The predominant rhythm was sinus rhythm with a low average heart rate at 50 and high average heart rate at 70 bpm. Delayed several atrial couplets and 1 atrial triplet. There was no ventricular ectopy. There were no episodes of atrial fibrillation. There were no pauses.    Nuclear stress test 05/01/2012: Overall Impression: Normal stress nuclear study. Low risk stress nuclear study.     Past Medical History:  Diagnosis Date   AAA (abdominal aortic aneurysm) 08/2014   scanning every 2 years   Arthritis    oa   Complication of anesthesia    slow to awaken in past   Family history of anesthesia complication    slow to awaken   GERD (gastroesophageal reflux disease)    Grade II diastolic dysfunction 04/2017   Noted on ECHO   H/O hiatal hernia    Headache(784.0)    migraines (rare)   Hemorrhoids    Hyperlipidemia    Hypertension 04/01/2011   ECHO-EF>55% NUC STRESS TEST- 05/01/12   Hypothyroidism    Mild aortic valve regurgitation 04/2017   Noted on ECHO   Mitral valve regurgitation 04/2017   Mild: Noted on ECHO   PONV (postoperative nausea and vomiting)    Pulmonary nodules 2018   scanning every 6 months   Sleep apnea 05/01/12 & 05/29/12   SLEEP STUDY-[facility], NO CPAP USED SINCE MAY 2015 Uses oral device    Past Surgical History:  Procedure Laterality Date   ANKLE SURGERY Right 1980   CARPOMETACARPEL SUSPENSION PLASTY Right 12/14/2014   Procedure: RIGHT THUMB CARPOMETACARPAL (CMC) ARTHROPLASTY;  Surgeon: Kay CHRISTELLA Cummins, MD;  Location: [facility];  Service: Orthopedics;  Laterality: Right;   CARPOMETACARPEL  SUSPENSION PLASTY Left 03/19/2017   Procedure: Left Thumb Ligament Reconstruction and Tendon Interposition;  Surgeon: Jerri Kay HERO, MD;  Location: [facility];  Service: Orthopedics;  Laterality: Left;   COLONOSCOPY WITH PROPOFOL  N/A 12/07/2013   Procedure: COLONOSCOPY WITH PROPOFOL ;  Surgeon: Gladis MARLA Louder, MD;  Location: [facility];  Service: Endoscopy;  Laterality: N/A;   CYSTECTOMY  1975   FOOT SURGERY Right 12/01/2012   FOOT SURGERY Left 12/10/2011   HEMORRHOID SURGERY  2005   HERNIA REPAIR  2019   paraesophageal hernia   Miscarriage  1983   TONSILLECTOMY  1964   VAGINAL HYSTERECTOMY     With pelvic floor repair    MEDICATIONS:  docusate sodium  (COLACE) 100 MG capsule   methocarbamol   (ROBAXIN ) 500 MG tablet   ondansetron  (ZOFRAN ) 4 MG tablet   oxyCODONE -acetaminophen  (PERCOCET) 5-325 MG tablet   vitamin C (ASCORBIC ACID) 250 MG tablet   acetaminophen  (TYLENOL ) 500 MG tablet   acidophilus (RISAQUAD) CAPS capsule   ALPRAZolam  (XANAX ) 0.25 MG tablet   butalbital -acetaminophen -caffeine  (FIORICET ) 50-325-40 MG tablet   Capsaicin 0.025 % PTCH   cephALEXin (KEFLEX) 250 MG capsule   Cholecalciferol (VITAMIN D ) 50 MCG (2000 UT) CAPS   CRANBERRY PO   doxycycline  (VIBRA -TABS) 100 MG tablet   escitalopram  (LEXAPRO ) 20 MG tablet   estradiol (ESTRACE) 0.1 MG/GM vaginal cream   ezetimibe  (ZETIA ) 10 MG tablet   hydrocortisone  2.5 % cream   levothyroxine  (SYNTHROID , LEVOTHROID) 50 MCG tablet   lubiprostone (AMITIZA) 8 MCG capsule   mupirocin  ointment (BACTROBAN ) 2 %   Peppermint Oil 50 MG CAPS   polyethylene glycol powder (GLYCOLAX /MIRALAX ) 17 GM/SCOOP powder   pravastatin  (PRAVACHOL ) 40 MG tablet   simethicone  (MYLICON) 80 MG chewable tablet   spironolactone  (ALDACTONE ) 25 MG tablet   SUMAtriptan  (IMITREX ) 100 MG tablet   traMADol  (ULTRAM ) 50 MG tablet   traMADol  (ULTRAM ) 50 MG tablet   valsartan  (DIOVAN ) 160 MG tablet   No current facility-administered medications for this encounter.  She is on cephalexin daily for bladder prophylaxis. Doxycyline is a postoperative order.    Isaiah Ruder, PA-C Surgical Short Stay/Anesthesiology [facility] Phone [phone] [facility] Phone [phone] 03/17/2024 11:58 PM

## 2024-03-17 NOTE — Telephone Encounter (Signed)
 sent

## 2024-03-17 NOTE — Progress Notes (Incomplete)
 Anesthesia Chart Review:   Case: [id number] Date/Time: 03/22/24 1100   Procedure: ARTHROPLASTY, KNEE, TOTAL (Right: Knee)   Anesthesia type: Spinal   Diagnosis: Primary osteoarthritis of right knee [M17.11]   Pre-op diagnosis: osteoarthritis of right knee   [facility]: [facility] ROOM 05 / [facility]   Surgeons: Jerri Kay HERO, MD       DISCUSSION: Patient is a 74 year old female scheduled for the above procedure.  History includes never smoker, post-operative N/V, HTN, HLD, diastolic dysfunction, valvular disease (mild AR, no MR 06/2022), GERD, hiatal hernia (s/p laparoscopic paraesophageal hernia repair, Nissen fundoplication 05/13/2017), hypothyroidism, pulmonary nodules, OSA (uses mouthpiece), migraines, osteoarthritis, hysterectomy (10/28/2019), prolonged emergence. No AAA noted on 09/2022 US . 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  She had an elevated CAC 153 (78th percentile) on 07/30/2023, but was already on statin therapy at that time. Preoperative cardiology evaluation outlined on 01/19/2024 visit with Dr. Lonni: According to the Revised Cardiac Risk Index (RCRI), her Perioperative Risk of Major Cardiac Event is (%): 0.4   Her Functional Capacity in METs is: 6.61 according to the Duke Activity Status Index (DASI).   The patient is not currently having active cardiac symptoms, and they can achieve >4 METs of activity.   According to ACC/AHA Guidelines, no further testing is needed.  Proceed with surgery at acceptable risk.  Our service is available as needed in the peri-operative period.     Anesthesia team to evaluate on the day of surgery.   VS: BP 114/74   Pulse 69   Temp 36.7 C   Resp 16   Ht 5' 1.5 (1.562 m)   Wt 72.1 kg   SpO2 99%   BMI 29.56 kg/m   PROVIDERS: Gerome Brunet, DO is PCP  Lonni Slain, MD is cardiologist Dolphus Reiter, MD is rheumatologist Alvia Craven, MD is urologist  LABS: Labs reviewed: Acceptable for surgery.  A1c 5.3%  on 01/08/2024. (all labs ordered are listed, but only abnormal results are displayed)  Labs Reviewed  SURGICAL PCR SCREEN - Abnormal; Notable for the following components:      Result Value   MRSA, PCR POSITIVE (*)    Staphylococcus aureus POSITIVE (*)    All other components within normal limits  CBC - Abnormal; Notable for the following components:   MCV 100.2 (*)    All other components within normal limits  BASIC METABOLIC PANEL WITH GFR - Abnormal; Notable for the following components:   Sodium 134 (*)    All other components within normal limits     IMAGES: CT chest (CAC CT over read) 07/30/2023: IMPRESSION: 1. Stable, likely benign 4 mm right middle lobe pulmonary nodule versus focal scar. 2. Mild, stable left lower lobe scarring.    EKG: 07/23/2023: Sinus bradycardia Septal infarct (cited on or before 21-Jan-2023) When compared with ECG of 21-Jan-2023 10:37, No significant change was found Confirmed by Burnard Ned ([id number]) on 07/23/2023 12:19:25 PM   CV: CT Cardiac Calcium  Scoring 07/30/2023: FINDINGS: Coronary arteries: Normal origins. Coronary Calcium  Score: Left main: 0 Left anterior descending artery: 70.3 Left circumflex artery: 82.3 Right coronary artery: 0 Total: 153 Percentile: 78th Pericardium: Normal. Ascending Aorta: Normal caliber.  Scattered calcifications.    IMPRESSION: - Coronary calcium  score of 153. This was 78th percentile for age-, race-, and sex-matched controls. RECOMMENDATIONS:... If CAC is >=100 or >=75th percentile, it is reasonable to initiate statin therapy at any age. - Cardiology referral should be considered for patients with CAC scores >=400 or >=  75th percentile...   US  abdominal aorta 10/07/2022: Summary:  Abdominal Aorta: No evidence of an abdominal aortic aneurysm was  visualized. The largest aortic measurement is 2.4 cm. The largest aortic  diameter remains essentially unchanged compared to prior exam. Previous  diameter  measurement was 2.5 cm obtained on  08/30/20.      Echo 06/10/2022: IMPRESSIONS   1. Left ventricular ejection fraction, by estimation, is 60 to 65%. The  left ventricle has normal function. The left ventricle has no regional  wall motion abnormalities. Left ventricular diastolic parameters are  consistent with Grade I diastolic  dysfunction (impaired relaxation).   2. Right ventricular systolic function is normal. The right ventricular  size is normal.   3. The mitral valve is normal in structure. No evidence of mitral valve  regurgitation. No evidence of mitral stenosis.   4. The aortic valve is tricuspid. There is mild calcification of the  aortic valve. Aortic valve regurgitation is mild. Aortic valve  sclerosis/calcification is present, without any evidence of aortic  stenosis.   5. The inferior vena cava is normal in size with greater than 50%  respiratory variability, suggesting right atrial pressure of 3 mmHg.  - Comparison(s): No significant change from prior study. Prior images  reviewed side by side.    US  carotid 01/12/2020: Summary:  - Right Carotid: Velocities in the right ICA are consistent with a 1-39% stenosis.  - Left Carotid: Velocities in the left ICA are consistent with a 1-39% stenosis.  - Vertebrals: Bilateral vertebral arteries demonstrate antegrade flow.  - Subclavians: Normal flow hemodynamics were seen in bilateral subclavian arteries.    Cardiac event monitor 09/16/2017: The patient was monitored for 14 days. The predominant rhythm was sinus rhythm with a low average heart rate at 50 and high average heart rate at 70 bpm. Delayed several atrial couplets and 1 atrial triplet. There was no ventricular ectopy. There were no episodes of atrial fibrillation. There were no pauses.    Nuclear stress test 05/01/2012: Overall Impression: Normal stress nuclear study. Low risk stress nuclear study.    Past Medical History:  Diagnosis Date   AAA (abdominal aortic  aneurysm) 08/2014   scanning every 2 years   Arthritis    oa   Complication of anesthesia    slow to awaken in past   Family history of anesthesia complication    slow to awaken   GERD (gastroesophageal reflux disease)    Grade II diastolic dysfunction 04/2017   Noted on ECHO   H/O hiatal hernia    Headache(784.0)    migraines (rare)   Hemorrhoids    Hyperlipidemia    Hypertension 04/01/2011   ECHO-EF>55% NUC STRESS TEST- 05/01/12   Hypothyroidism    Mild aortic valve regurgitation 04/2017   Noted on ECHO   Mitral valve regurgitation 04/2017   Mild: Noted on ECHO   PONV (postoperative nausea and vomiting)    Pulmonary nodules 2018   scanning every 6 months   Sleep apnea 05/01/12 & 05/29/12   SLEEP STUDY-[facility], NO CPAP USED SINCE MAY 2015 Uses oral device    Past Surgical History:  Procedure Laterality Date   ANKLE SURGERY Right 1980   CARPOMETACARPEL SUSPENSION PLASTY Right 12/14/2014   Procedure: RIGHT THUMB CARPOMETACARPAL (CMC) ARTHROPLASTY;  Surgeon: Kay CHRISTELLA Cummins, MD;  Location: [facility];  Service: Orthopedics;  Laterality: Right;   CARPOMETACARPEL SUSPENSION PLASTY Left 03/19/2017   Procedure: Left Thumb Ligament Reconstruction and Tendon Interposition;  Surgeon: Jerri Kay HERO, MD;  Location: [facility];  Service: Orthopedics;  Laterality: Left;   COLONOSCOPY WITH PROPOFOL  N/A 12/07/2013   Procedure: COLONOSCOPY WITH PROPOFOL ;  Surgeon: Gladis MARLA Louder, MD;  Location: [facility];  Service: Endoscopy;  Laterality: N/A;   CYSTECTOMY  1975   FOOT SURGERY Right 12/01/2012   FOOT SURGERY Left 12/10/2011   HEMORRHOID SURGERY  2005   HERNIA REPAIR  2019   paraesophageal hernia   Miscarriage  1983   TONSILLECTOMY  1964   VAGINAL HYSTERECTOMY     With pelvic floor repair    MEDICATIONS:  docusate sodium  (COLACE) 100 MG capsule   methocarbamol  (ROBAXIN ) 500 MG tablet   ondansetron   (ZOFRAN ) 4 MG tablet   oxyCODONE -acetaminophen  (PERCOCET) 5-325 MG tablet   vitamin C (ASCORBIC ACID) 250 MG tablet   acetaminophen  (TYLENOL ) 500 MG tablet   acidophilus (RISAQUAD) CAPS capsule   ALPRAZolam  (XANAX ) 0.25 MG tablet   butalbital -acetaminophen -caffeine  (FIORICET ) 50-325-40 MG tablet   Capsaicin 0.025 % PTCH   cephALEXin (KEFLEX) 250 MG capsule   Cholecalciferol (VITAMIN D ) 50 MCG (2000 UT) CAPS   CRANBERRY PO   doxycycline  (VIBRA -TABS) 100 MG tablet   escitalopram  (LEXAPRO ) 20 MG tablet   estradiol (ESTRACE) 0.1 MG/GM vaginal cream   ezetimibe  (ZETIA ) 10 MG tablet   hydrocortisone  2.5 % cream   levothyroxine  (SYNTHROID , LEVOTHROID) 50 MCG tablet   lubiprostone (AMITIZA) 8 MCG capsule   mupirocin  ointment (BACTROBAN ) 2 %   Peppermint Oil 50 MG CAPS   polyethylene glycol powder (GLYCOLAX /MIRALAX ) 17 GM/SCOOP powder   pravastatin  (PRAVACHOL ) 40 MG tablet   simethicone  (MYLICON) 80 MG chewable tablet   spironolactone  (ALDACTONE ) 25 MG tablet   SUMAtriptan  (IMITREX ) 100 MG tablet   traMADol  (ULTRAM ) 50 MG tablet   traMADol  (ULTRAM ) 50 MG tablet   valsartan  (DIOVAN ) 160 MG tablet   No current facility-administered medications for this encounter.    Isaiah Ruder, PA-C Surgical Short Stay/Anesthesiology [facility] Phone [phone] [facility] Phone [phone] 03/17/2024 11:58 PM

## 2024-03-18 NOTE — Anesthesia Preprocedure Evaluation (Signed)
"                                    Anesthesia Evaluation  Patient identified by MRN, date of birth, ID band Patient awake    Reviewed: Allergy & Precautions, NPO status , Patient's Chart, lab work & pertinent test results  History of Anesthesia Complications (+) PONV and history of anesthetic complications  Airway Mallampati: II  TM Distance: >3 FB Neck ROM: Full    Dental  (+) Dental Advisory Given   Pulmonary sleep apnea and Continuous Positive Airway Pressure Ventilation    Pulmonary exam normal        Cardiovascular hypertension, Pt. on medications Normal cardiovascular exam   '24 TTE - EF 60 to 65%. Grade I diastolic dysfunction (impaired relaxation). Aortic valve regurgitation is mild.   '21 Carotid US  - 1-39% b/l ICAS     Neuro/Psych  Headaches  negative psych ROS   GI/Hepatic Neg liver ROS, hiatal hernia,GERD  Controlled and Medicated,,  Endo/Other  Hypothyroidism    Renal/GU negative Renal ROS     Musculoskeletal  (+) Arthritis ,    Abdominal   Peds  Hematology  On eliquis     Anesthesia Other Findings   Reproductive/Obstetrics                              Anesthesia Physical Anesthesia Plan  ASA: 3  Anesthesia Plan: Spinal   Post-op Pain Management: Tylenol  PO (pre-op)* and Regional block*   Induction:   PONV Risk Score and Plan: 3 and Treatment may vary due to age or medical condition and Propofol  infusion  Airway Management Planned: Natural Airway and Simple Face Mask  Additional Equipment: None  Intra-op Plan:   Post-operative Plan:   Informed Consent: I have reviewed the patients History and Physical, chart, labs and discussed the procedure including the risks, benefits and alternatives for the proposed anesthesia with the patient or authorized representative who has indicated his/her understanding and acceptance.       Plan Discussed with: CRNA and Anesthesiologist  Anesthesia Plan  Comments: (PAT note written 03/17/2024 by Armour Villanueva, PA-C.)         Anesthesia Quick Evaluation  "

## 2024-03-22 ENCOUNTER — Observation Stay (HOSPITAL_COMMUNITY)
Admission: RE | Admit: 2024-03-22 | Discharge: 2024-03-23 | Disposition: A | Attending: Orthopaedic Surgery | Admitting: Orthopaedic Surgery

## 2024-03-22 ENCOUNTER — Encounter (HOSPITAL_COMMUNITY): Admission: RE | Disposition: A | Payer: Self-pay | Source: Home / Self Care | Attending: Orthopaedic Surgery

## 2024-03-22 ENCOUNTER — Observation Stay (HOSPITAL_COMMUNITY)

## 2024-03-22 ENCOUNTER — Other Ambulatory Visit (HOSPITAL_COMMUNITY): Payer: Self-pay

## 2024-03-22 ENCOUNTER — Encounter (HOSPITAL_COMMUNITY): Payer: Self-pay | Admitting: Orthopaedic Surgery

## 2024-03-22 ENCOUNTER — Other Ambulatory Visit: Payer: Self-pay

## 2024-03-22 ENCOUNTER — Other Ambulatory Visit: Payer: Self-pay | Admitting: Physician Assistant

## 2024-03-22 ENCOUNTER — Ambulatory Visit (HOSPITAL_COMMUNITY): Payer: Self-pay | Admitting: Vascular Surgery

## 2024-03-22 ENCOUNTER — Ambulatory Visit (HOSPITAL_COMMUNITY): Admitting: Anesthesiology

## 2024-03-22 DIAGNOSIS — G4733 Obstructive sleep apnea (adult) (pediatric): Secondary | ICD-10-CM | POA: Diagnosis not present

## 2024-03-22 DIAGNOSIS — E039 Hypothyroidism, unspecified: Secondary | ICD-10-CM | POA: Diagnosis not present

## 2024-03-22 DIAGNOSIS — Z96651 Presence of right artificial knee joint: Secondary | ICD-10-CM

## 2024-03-22 DIAGNOSIS — Z79899 Other long term (current) drug therapy: Secondary | ICD-10-CM | POA: Insufficient documentation

## 2024-03-22 DIAGNOSIS — I10 Essential (primary) hypertension: Secondary | ICD-10-CM | POA: Diagnosis not present

## 2024-03-22 DIAGNOSIS — M1612 Unilateral primary osteoarthritis, left hip: Secondary | ICD-10-CM

## 2024-03-22 DIAGNOSIS — M1711 Unilateral primary osteoarthritis, right knee: Principal | ICD-10-CM | POA: Diagnosis present

## 2024-03-22 DIAGNOSIS — M25561 Pain in right knee: Secondary | ICD-10-CM | POA: Diagnosis present

## 2024-03-22 HISTORY — PX: TOTAL KNEE ARTHROPLASTY: SHX125

## 2024-03-22 SURGERY — ARTHROPLASTY, KNEE, TOTAL
Anesthesia: Spinal | Site: Knee | Laterality: Right

## 2024-03-22 MED ORDER — DEXAMETHASONE SOD PHOSPHATE PF 10 MG/ML IJ SOLN
10.0000 mg | Freq: Once | INTRAMUSCULAR | Status: AC
  Administered 2024-03-23: 10 mg via INTRAVENOUS
  Filled 2024-03-22: qty 1

## 2024-03-22 MED ORDER — FENTANYL CITRATE (PF) 100 MCG/2ML IJ SOLN
INTRAMUSCULAR | Status: AC
  Filled 2024-03-22: qty 2

## 2024-03-22 MED ORDER — METOCLOPRAMIDE HCL 5 MG/ML IJ SOLN
5.0000 mg | Freq: Three times a day (TID) | INTRAMUSCULAR | Status: DC | PRN
  Administered 2024-03-22 – 2024-03-23 (×2): 10 mg via INTRAVENOUS
  Filled 2024-03-22 (×2): qty 2

## 2024-03-22 MED ORDER — ESCITALOPRAM OXALATE 10 MG PO TABS: 10.0000 mg | ORAL_TABLET | Freq: Every morning | ORAL | Status: DC

## 2024-03-22 MED ORDER — PROPOFOL 500 MG/50ML IV EMUL
INTRAVENOUS | Status: DC | PRN
  Administered 2024-03-22: 120 ug/kg/min via INTRAVENOUS

## 2024-03-22 MED ORDER — MENTHOL 3 MG MT LOZG: 1.0000 | LOZENGE | OROMUCOSAL | Status: DC | PRN

## 2024-03-22 MED ORDER — VANCOMYCIN HCL 1000 MG IV SOLR
INTRAVENOUS | Status: DC | PRN
  Administered 2024-03-22: 1000 mg

## 2024-03-22 MED ORDER — TRANEXAMIC ACID 1000 MG/10ML IV SOLN
INTRAVENOUS | Status: DC | PRN
  Administered 2024-03-22: 2000 mg via TOPICAL

## 2024-03-22 MED ORDER — ONDANSETRON HCL 4 MG/2ML IJ SOLN
INTRAMUSCULAR | Status: DC | PRN
  Administered 2024-03-22: 4 mg via INTRAVENOUS

## 2024-03-22 MED ORDER — TRANEXAMIC ACID 1000 MG/10ML IV SOLN
2000.0000 mg | INTRAVENOUS | Status: DC
  Filled 2024-03-22: qty 20

## 2024-03-22 MED ORDER — TRANEXAMIC ACID-NACL 1000-0.7 MG/100ML-% IV SOLN
1000.0000 mg | Freq: Once | INTRAVENOUS | Status: AC
  Administered 2024-03-22: 1000 mg via INTRAVENOUS
  Filled 2024-03-22: qty 100

## 2024-03-22 MED ORDER — OXYCODONE HCL 5 MG/5ML PO SOLN: 5.0000 mg | Freq: Once | ORAL | Status: DC | PRN

## 2024-03-22 MED ORDER — BUPIVACAINE-MELOXICAM ER 400-12 MG/14ML IJ SOLN
INTRAMUSCULAR | Status: AC
  Filled 2024-03-22: qty 1

## 2024-03-22 MED ORDER — EPHEDRINE SULFATE-NACL 50-0.9 MG/10ML-% IV SOSY
PREFILLED_SYRINGE | INTRAVENOUS | Status: DC | PRN
  Administered 2024-03-22: 7.5 mg via INTRAVENOUS

## 2024-03-22 MED ORDER — ACETAMINOPHEN 325 MG PO TABS: 325.0000 mg | ORAL_TABLET | Freq: Four times a day (QID) | ORAL | Status: DC | PRN

## 2024-03-22 MED ORDER — DOCUSATE SODIUM 100 MG PO CAPS
100.0000 mg | ORAL_CAPSULE | Freq: Two times a day (BID) | ORAL | Status: DC
  Administered 2024-03-22: 100 mg via ORAL
  Filled 2024-03-22: qty 1

## 2024-03-22 MED ORDER — METHOCARBAMOL 1000 MG/10ML IJ SOLN
500.0000 mg | Freq: Four times a day (QID) | INTRAMUSCULAR | Status: DC | PRN
  Administered 2024-03-22: 500 mg via INTRAVENOUS
  Filled 2024-03-22: qty 10

## 2024-03-22 MED ORDER — FENTANYL CITRATE (PF) 100 MCG/2ML IJ SOLN
INTRAMUSCULAR | Status: AC
  Administered 2024-03-22: 50 ug via INTRAVENOUS
  Filled 2024-03-22: qty 2

## 2024-03-22 MED ORDER — TRANEXAMIC ACID-NACL 1000-0.7 MG/100ML-% IV SOLN
1000.0000 mg | INTRAVENOUS | Status: AC
  Administered 2024-03-22: 1000 mg via INTRAVENOUS
  Filled 2024-03-22: qty 100

## 2024-03-22 MED ORDER — PHENYLEPHRINE HCL-NACL 20-0.9 MG/250ML-% IV SOLN
INTRAVENOUS | Status: DC | PRN
  Administered 2024-03-22: 30 ug/min via INTRAVENOUS

## 2024-03-22 MED ORDER — HYDROMORPHONE HCL 1 MG/ML IJ SOLN
1.0000 mg | Freq: Three times a day (TID) | INTRAMUSCULAR | Status: DC | PRN
  Administered 2024-03-22: 1 mg via INTRAVENOUS
  Filled 2024-03-22: qty 1

## 2024-03-22 MED ORDER — METHOCARBAMOL 500 MG PO TABS
500.0000 mg | ORAL_TABLET | Freq: Four times a day (QID) | ORAL | Status: DC | PRN
  Filled 2024-03-22: qty 1

## 2024-03-22 MED ORDER — APIXABAN 2.5 MG PO TABS: 2.5000 mg | ORAL_TABLET | Freq: Two times a day (BID) | ORAL | Status: DC

## 2024-03-22 MED ORDER — OXYCODONE HCL 5 MG PO TABS
10.0000 mg | ORAL_TABLET | Freq: Four times a day (QID) | ORAL | Status: DC | PRN
  Administered 2024-03-22: 10 mg via ORAL
  Filled 2024-03-22: qty 2

## 2024-03-22 MED ORDER — 0.9 % SODIUM CHLORIDE (POUR BTL) OPTIME
TOPICAL | Status: DC | PRN
  Administered 2024-03-22: 1000 mL

## 2024-03-22 MED ORDER — APIXABAN 2.5 MG PO TABS: ORAL_TABLET | ORAL | 0 refills | Status: DC

## 2024-03-22 MED ORDER — MUPIROCIN 2 % EX OINT: 1.0000 | TOPICAL_OINTMENT | Freq: Two times a day (BID) | CUTANEOUS | 0 refills | Status: AC

## 2024-03-22 MED ORDER — CHLORHEXIDINE GLUCONATE 4 % EX SOLN: 1.0000 | CUTANEOUS | 1 refills | Status: AC

## 2024-03-22 MED ORDER — AMISULPRIDE (ANTIEMETIC) 5 MG/2ML IV SOLN
INTRAVENOUS | Status: AC
  Filled 2024-03-22: qty 4

## 2024-03-22 MED ORDER — CEFAZOLIN SODIUM-DEXTROSE 2-4 GM/100ML-% IV SOLN
2.0000 g | INTRAVENOUS | Status: AC
  Administered 2024-03-22: 2 g via INTRAVENOUS
  Filled 2024-03-22: qty 100

## 2024-03-22 MED ORDER — SPIRONOLACTONE 25 MG PO TABS
25.0000 mg | ORAL_TABLET | Freq: Every day | ORAL | Status: DC
  Filled 2024-03-22: qty 1

## 2024-03-22 MED ORDER — LEVOTHYROXINE SODIUM 50 MCG PO TABS
50.0000 ug | ORAL_TABLET | Freq: Every day | ORAL | Status: DC
  Administered 2024-03-23: 50 ug via ORAL
  Filled 2024-03-22: qty 1

## 2024-03-22 MED ORDER — ONDANSETRON HCL 4 MG PO TABS: 4.0000 mg | ORAL_TABLET | Freq: Four times a day (QID) | ORAL | Status: DC | PRN

## 2024-03-22 MED ORDER — LACTATED RINGERS IV SOLN: INTRAVENOUS | Status: DC

## 2024-03-22 MED ORDER — PHENOL 1.4 % MT LIQD: 1.0000 | OROMUCOSAL | Status: DC | PRN

## 2024-03-22 MED ORDER — OXYCODONE HCL 5 MG PO TABS
5.0000 mg | ORAL_TABLET | Freq: Four times a day (QID) | ORAL | Status: DC | PRN
  Administered 2024-03-23 (×2): 5 mg via ORAL
  Filled 2024-03-22 (×2): qty 1

## 2024-03-22 MED ORDER — CEFAZOLIN SODIUM-DEXTROSE 2-4 GM/100ML-% IV SOLN
2.0000 g | Freq: Four times a day (QID) | INTRAVENOUS | Status: AC
  Administered 2024-03-22 – 2024-03-23 (×2): 2 g via INTRAVENOUS
  Filled 2024-03-22 (×3): qty 100

## 2024-03-22 MED ORDER — ORAL CARE MOUTH RINSE: 15.0000 mL | Freq: Once | OROMUCOSAL | Status: AC

## 2024-03-22 MED ORDER — ACETAMINOPHEN 500 MG PO TABS
1000.0000 mg | ORAL_TABLET | Freq: Four times a day (QID) | ORAL | Status: DC
  Administered 2024-03-22 – 2024-03-23 (×3): 1000 mg via ORAL
  Filled 2024-03-22 (×4): qty 2

## 2024-03-22 MED ORDER — METOCLOPRAMIDE HCL 5 MG PO TABS: 5.0000 mg | ORAL_TABLET | Freq: Three times a day (TID) | ORAL | Status: DC | PRN

## 2024-03-22 MED ORDER — ROPIVACAINE HCL 7.5 MG/ML IJ SOLN
INTRAMUSCULAR | Status: DC | PRN
  Administered 2024-03-22: 20 mL via PERINEURAL

## 2024-03-22 MED ORDER — BUPIVACAINE IN DEXTROSE 0.75-8.25 % IT SOLN
INTRATHECAL | Status: DC | PRN
  Administered 2024-03-22: 1.5 mL via INTRATHECAL

## 2024-03-22 MED ORDER — VANCOMYCIN HCL 1000 MG IV SOLR
INTRAVENOUS | Status: AC
  Filled 2024-03-22: qty 20

## 2024-03-22 MED ORDER — OXYCODONE HCL 5 MG PO TABS: 5.0000 mg | ORAL_TABLET | Freq: Once | ORAL | Status: DC | PRN

## 2024-03-22 MED ORDER — ALPRAZOLAM 0.5 MG PO TABS: 0.2500 mg | ORAL_TABLET | Freq: Every evening | ORAL | Status: DC | PRN

## 2024-03-22 MED ORDER — AMISULPRIDE (ANTIEMETIC) 5 MG/2ML IV SOLN
10.0000 mg | Freq: Once | INTRAVENOUS | Status: AC
  Administered 2024-03-22: 10 mg via INTRAVENOUS

## 2024-03-22 MED ORDER — CHLORHEXIDINE GLUCONATE 0.12 % MT SOLN
15.0000 mL | Freq: Once | OROMUCOSAL | Status: AC
  Administered 2024-03-22: 15 mL via OROMUCOSAL
  Filled 2024-03-22: qty 15

## 2024-03-22 MED ORDER — SODIUM CHLORIDE 0.9 % IR SOLN
Status: DC | PRN
  Administered 2024-03-22: 1000 mL

## 2024-03-22 MED ORDER — ACETAMINOPHEN 500 MG PO TABS
1000.0000 mg | ORAL_TABLET | Freq: Once | ORAL | Status: AC
  Administered 2024-03-22: 500 mg via ORAL
  Filled 2024-03-22: qty 2

## 2024-03-22 MED ORDER — POVIDONE-IODINE 10 % EX SWAB
2.0000 | Freq: Once | CUTANEOUS | Status: AC
  Administered 2024-03-22: 2 via TOPICAL

## 2024-03-22 MED ORDER — SODIUM CHLORIDE 0.9 % IV SOLN: INTRAVENOUS | Status: DC

## 2024-03-22 MED ORDER — FENTANYL CITRATE (PF) 100 MCG/2ML IJ SOLN
25.0000 ug | INTRAMUSCULAR | Status: DC | PRN
  Administered 2024-03-22: 50 ug via INTRAVENOUS
  Administered 2024-03-22 (×2): 25 ug via INTRAVENOUS
  Administered 2024-03-22: 50 ug via INTRAVENOUS

## 2024-03-22 MED ORDER — KETOROLAC TROMETHAMINE 15 MG/ML IJ SOLN
7.5000 mg | Freq: Four times a day (QID) | INTRAMUSCULAR | Status: DC
  Administered 2024-03-23 (×3): 7.5 mg via INTRAVENOUS
  Filled 2024-03-22 (×4): qty 1

## 2024-03-22 MED ORDER — ONDANSETRON HCL 4 MG/2ML IJ SOLN
4.0000 mg | Freq: Once | INTRAMUSCULAR | Status: AC | PRN
  Administered 2024-03-22: 4 mg via INTRAVENOUS

## 2024-03-22 MED ORDER — ONDANSETRON HCL 4 MG/2ML IJ SOLN
INTRAMUSCULAR | Status: AC
  Filled 2024-03-22: qty 2

## 2024-03-22 MED ORDER — APIXABAN 2.5 MG PO TABS
ORAL_TABLET | ORAL | 0 refills | Status: DC
  Filled 2024-03-22: qty 60, fill #0

## 2024-03-22 MED ORDER — PRONTOSAN WOUND IRRIGATION OPTIME
TOPICAL | Status: DC | PRN
  Administered 2024-03-22: 350 mL

## 2024-03-22 MED ORDER — DEXAMETHASONE SOD PHOSPHATE PF 10 MG/ML IJ SOLN
INTRAMUSCULAR | Status: DC | PRN
  Administered 2024-03-22: 5 mg via INTRAVENOUS

## 2024-03-22 MED ORDER — ONDANSETRON HCL 4 MG/2ML IJ SOLN
4.0000 mg | Freq: Four times a day (QID) | INTRAMUSCULAR | Status: DC | PRN
  Administered 2024-03-22 – 2024-03-23 (×4): 4 mg via INTRAVENOUS
  Filled 2024-03-22 (×4): qty 2

## 2024-03-22 MED ORDER — MIDAZOLAM HCL 2 MG/2ML IJ SOLN
INTRAMUSCULAR | Status: AC
  Filled 2024-03-22: qty 2

## 2024-03-22 MED ORDER — VANCOMYCIN HCL IN DEXTROSE 1-5 GM/200ML-% IV SOLN
1000.0000 mg | Freq: Once | INTRAVENOUS | Status: AC
  Administered 2024-03-22: 1000 mg via INTRAVENOUS
  Filled 2024-03-22: qty 200

## 2024-03-22 MED ORDER — IRBESARTAN 75 MG PO TABS
75.0000 mg | ORAL_TABLET | Freq: Every day | ORAL | Status: DC
  Filled 2024-03-22 (×2): qty 1

## 2024-03-22 MED ORDER — FENTANYL CITRATE (PF) 100 MCG/2ML IJ SOLN: 50.0000 ug | Freq: Once | INTRAMUSCULAR | Status: AC

## 2024-03-22 SURGICAL SUPPLY — 66 items
ALCOHOL 70% 16 OZ (MISCELLANEOUS) ×1 IMPLANT
BAG COUNTER SPONGE SURGICOUNT (BAG) ×1 IMPLANT
BAG DECANTER FOR FLEXI CONT (MISCELLANEOUS) ×1 IMPLANT
BLADE SAG 18X100X1.27 (BLADE) ×1 IMPLANT
BLADE SAGITTAL 25.0X1.27X90 (BLADE) IMPLANT
BLADE SAW SAG 90X13X1.27 (BLADE) ×1 IMPLANT
BLADE SAW SGTL 73X25 THK (BLADE) ×1 IMPLANT
BNDG COMPR ESMARK 6X3 LF (GAUZE/BANDAGES/DRESSINGS) IMPLANT
BOWL SMART MIX CTS (DISPOSABLE) IMPLANT
CEMENT BONE REFOBACIN R1X40 US (Cement) IMPLANT
CLSR STERI-STRIP ANTIMIC 1/2X4 (GAUZE/BANDAGES/DRESSINGS) IMPLANT
COMP FEM CMT KNEE NRW 5 RT (Joint) IMPLANT
COOLER ICEMAN CLASSIC (MISCELLANEOUS) ×1 IMPLANT
COVER SURGICAL LIGHT HANDLE (MISCELLANEOUS) ×1 IMPLANT
CUFF TOURN SGL QUICK 42 (TOURNIQUET CUFF) IMPLANT
CUFF TRNQT CYL 34X4.125X (TOURNIQUET CUFF) ×1 IMPLANT
DERMABOND ADVANCED .7 DNX12 (GAUZE/BANDAGES/DRESSINGS) ×1 IMPLANT
DRAPE EXTREMITY T 121X128X90 (DISPOSABLE) ×1 IMPLANT
DRAPE HALF SHEET 40X57 (DRAPES) ×1 IMPLANT
DRAPE INCISE IOBAN 66X45 STRL (DRAPES) ×1 IMPLANT
DRAPE POUCH INSTRU U-SHP 10X18 (DRAPES) ×1 IMPLANT
DRAPE SURG ORHT 6 SPLT 77X108 (DRAPES) IMPLANT
DRAPE U-SHAPE 47X51 STRL (DRAPES) ×2 IMPLANT
DRSG AQUACEL AG ADV 3.5X10 (GAUZE/BANDAGES/DRESSINGS) ×1 IMPLANT
DURAPREP 26ML APPLICATOR (WOUND CARE) ×3 IMPLANT
ELECT CAUTERY BLADE 6.4 (BLADE) ×1 IMPLANT
ELECT PENCIL ROCKER SW 15FT (MISCELLANEOUS) ×1 IMPLANT
ELECTRODE REM PT RTRN 9FT ADLT (ELECTROSURGICAL) ×1 IMPLANT
GLOVE BIOGEL PI IND STRL 7.5 (GLOVE) ×1 IMPLANT
GLOVE BIOGEL PI MICRO STRL 7 (GLOVE) ×5 IMPLANT
GLOVE INDICATOR 7.0 STRL GRN (GLOVE) ×1 IMPLANT
GLOVE SURG SYN 7.5 PF PI (GLOVE) ×5 IMPLANT
GOWN STRL REUS W/ TWL LRG LVL3 (GOWN DISPOSABLE) ×2 IMPLANT
GOWN TOGA ZIPPER T7+ PEEL AWAY (MISCELLANEOUS) ×1 IMPLANT
HOOD PEEL AWAY T7 (MISCELLANEOUS) ×1 IMPLANT
IMPL ASF RT PSN 4-5/CD 10 (Joint) IMPLANT
IV 0.9% NACL 1000 ML (IV SOLUTION) ×1 IMPLANT
KIT BASIN OR (CUSTOM PROCEDURE TRAY) ×1 IMPLANT
KIT TURNOVER KIT B (KITS) ×1 IMPLANT
MANIFOLD NEPTUNE II (INSTRUMENTS) ×1 IMPLANT
MARKER SKIN DUAL TIP RULER LAB (MISCELLANEOUS) ×2 IMPLANT
NEEDLE SPNL 18GX3.5 QUINCKE PK (NEEDLE) ×1 IMPLANT
PACK TOTAL JOINT (CUSTOM PROCEDURE TRAY) ×1 IMPLANT
PAD ARMBOARD POSITIONER FOAM (MISCELLANEOUS) ×2 IMPLANT
PAD COLD SHLDR WRAP-ON (PAD) ×1 IMPLANT
PIN DRILL HDLS TROCAR 75 4PK (PIN) IMPLANT
SCREW FEMALE HEX FIX 25X2.5 (ORTHOPEDIC DISPOSABLE SUPPLIES) IMPLANT
SET HNDPC FAN SPRY TIP SCT (DISPOSABLE) ×1 IMPLANT
SOLN 0.9% NACL POUR BTL 1000ML (IV SOLUTION) ×1 IMPLANT
SOLUTION PRONTOSAN WOUND 350ML (IRRIGATION / IRRIGATOR) ×1 IMPLANT
STAPLER SKIN PROX 35W (STAPLE) IMPLANT
STEM POLY PAT PLY 29M KNEE (Knees) IMPLANT
STEM TIB ST PERS 14+30 (Stem) IMPLANT
STEM TIBIA 5 DEG SZ C R KNEE (Knees) IMPLANT
SUCTION TUBE FRAZIER 10FR DISP (SUCTIONS) IMPLANT
SUT ETHILON 2 0 FS 18 (SUTURE) ×2 IMPLANT
SUT STRATAFIX PDS+ 0 24IN (SUTURE) ×1 IMPLANT
SUT VIC AB 0 CT1 27XBRD ANBCTR (SUTURE) ×1 IMPLANT
SUT VIC AB 1 CTX 27 (SUTURE) IMPLANT
SUT VIC AB 2-0 CT1 TAPERPNT 27 (SUTURE) ×3 IMPLANT
SYR 30ML LL (SYRINGE) ×2 IMPLANT
TOWEL GREEN STERILE (TOWEL DISPOSABLE) ×1 IMPLANT
TOWEL GREEN STERILE FF (TOWEL DISPOSABLE) ×1 IMPLANT
TUBE SUCT ARGYLE STRL (TUBING) ×1 IMPLANT
UNDERPAD 30X36 HEAVY ABSORB (UNDERPADS AND DIAPERS) ×1 IMPLANT
YANKAUER SUCT BULB TIP NO VENT (SUCTIONS) ×1 IMPLANT

## 2024-03-22 NOTE — Anesthesia Postprocedure Evaluation (Signed)
"   Anesthesia Post Note  Patient: Jenny Bradford  Procedure(s) Performed: ARTHROPLASTY, KNEE, TOTAL (Right: Knee)     Patient location during evaluation: PACU Anesthesia Type: Spinal Level of consciousness: awake and alert Pain management: pain level controlled Vital Signs Assessment: post-procedure vital signs reviewed and stable Respiratory status: spontaneous breathing and respiratory function stable Cardiovascular status: blood pressure returned to baseline and stable Postop Assessment: spinal receding and no apparent nausea or vomiting Anesthetic complications: no   No notable events documented.  Last Vitals:  Vitals:   03/22/24 1445 03/22/24 1500  BP: 129/65 135/67  Pulse: (!) 49 (!) 52  Resp: 10 10  Temp: (!) 35.1 C (!) 35.2 C  SpO2: 93% 95%    Last Pain:  Vitals:   03/22/24 1500  TempSrc:   PainSc: 4                  Debby FORBES Like      "

## 2024-03-22 NOTE — Op Note (Signed)
 "  Total Knee Arthroplasty Procedure Note  Preoperative diagnosis: Right knee osteoarthritis  Postoperative diagnosis:same  Operative findings: Complete loss articular cartilage from patellar and medial compartment Osteopenia Hypermobility, hyperextension  Operative procedure: Right total knee arthroplasty. CPT [id number]  Surgeon: N. Ozell Cummins, MD  Assist: Ronal Morna Grave, PA-C; necessary for the timely completion of procedure and due to complexity of procedure.  Anesthesia: Spinal, regional, local  Tourniquet time: see anesthesia record  Implants used: Zimmer persona Femur: CR 5 narrow, nickel free Tibia: C with 30 mm cemented stem Patella: 29 mm Polyethylene: 10 mm medial congruent  Indication: Jenny Bradford is a 74 y.o. year old female with a history of knee pain. Having failed conservative management, the patient elected to proceed with a total knee arthroplasty.  We have reviewed the risk and benefits of the surgery and they elected to proceed after voicing understanding.  Procedure:  After informed consent was obtained and understanding of the risk were voiced including but not limited to bleeding, infection, damage to surrounding structures including nerves and vessels, blood clots, leg length inequality and the failure to achieve desired results, the operative extremity was marked with verbal confirmation of the patient in the holding area.   The patient was then brought to the operating room and transported to the operating room table in the supine position.  A tourniquet was applied to the operative extremity around the upper thigh. The operative limb was then prepped and draped in the usual sterile fashion and preoperative antibiotics were administered.  A time out was performed prior to the start of surgery confirming the correct extremity, preoperative antibiotic administration, as well as team members, implants and instruments available for the case. Correct  surgical site was also confirmed with preoperative radiographs. The limb was then elevated for exsanguination and the tourniquet was inflated. A midline incision was made and a standard medial parapatellar approach was performed.  The infrapatellar fat pad was removed.  Suprapatellar synovium was removed to reveal the anterior distal femoral cortex.  A medial peel was performed to release the capsule and the deep MCL off of the medial tibial plateau back to the semimembranosus.  The patella was then everted which showed complete loss of articular cartilage and was resected down to 14 mm and sized to a 29 mm.  A cover was placed on the patella for protection from retractors.  The knee was then brought into flexion and we then turned our attention to the femur.  The ACL was sacrificed.  Start site was drilled in the femur and the intramedullary distal femoral cutting guide was placed, set at 5 degrees valgus, taking 8 mm of distal resection because her knee hyperextended. The distal cut was made. Osteophytes were then removed.  Next, the proximal tibial cutting guide was placed with appropriate slope, varus/valgus alignment and depth of resection.  The drop rod was attached to confirm that it was aimed at the second metatarsal.  The proximal tibial cut was made taking 2 mm off the low medial side. Gap blocks were then used to assess the extension gap and alignment, and appropriate soft tissue releases were performed. Attention was turned back to the femur, which was sized using the sizing guide to a size 5 narrow. Appropriate rotation of the femoral component was determined using epicondylar axis, Whitesides line, and assessing the flexion gap under ligament tension. The appropriate size 4-in-1 cutting block was placed and checked with an angel wing and cuts were made. Posterior  femoral osteophytes and uncapped bone were then removed with the curved osteotome.  The menisci were removed.  Trial components were  placed, and stability was checked in full extension, mid-flexion, and deep flexion.  PCL was retained. Proper tibial rotation was determined and marked.  The patella tracked well without a lateral release.  The femoral lugs were then drilled. Trial components were then removed and tibial preparation performed.  The trial tibia was pointed to the medial third of the tibial tubercle.  The tibia was sized for a size C component and prepared.  Trial components were removed.    The bony surfaces were irrigated with a pulse lavage and then dried. Bone cement was vacuum mixed on the back table, and the final components sized above were cemented into place.  Antibiotic irrigation was placed in the knee joint and soft tissues while the cement cured.  After cement had finished curing, excess cement was removed. The stability of the construct was re-evaluated throughout a range of motion and found to be acceptable. The trial liner was removed, the knee was copiously irrigated, and the knee was re-evaluated for any excess bone debris. The real polyethylene liner, 10 mm thick, was inserted and checked to ensure the locking mechanism had engaged appropriately. The tourniquet was deflated and hemostasis was achieved. The wound was irrigated with normal saline.  One gram of vancomycin  powder was placed in the surgical bed.  Topical mixture of 0.25% bupivacaine  and meloxicam  was placed in the joint for postoperative pain.  Capsular closure was performed with a #1 stratafix in flexion, subcutaneous fat closed with a 0 vicryl suture, then subcutaneous tissue closed with interrupted 2.0 vicryl suture. The skin was then closed with a 2.0 nylon and dermabond. A sterile dressing was applied.  The patient was awakened in the operating room and taken to recovery in stable condition. All sponge, needle, and instrument counts were correct at the end of the case.  Morna Grave was necessary for opening, closing, retracting, limb  positioning and overall facilitation and completion of the surgery.  Position: supine  Complications: none.  Time Out: performed   Drains/Packing: none Estimated blood loss: minimal Returned to Recovery Room: in good condition.   Mechanical VTE (DVT) Prophylaxis: sequential compression devices, TED thigh-high  Chemical VTE (DVT) Prophylaxis: eliquis  POD 1  Fluid Replacement  Crystalloid: see anesthesia record Blood: none  FFP: none   Specimens Removed: 1 to pathology  Sponge and Instrument Count Correct? yes  PACU: portable radiograph - knee AP and Lateral  Plan/RTC: Return in 2 weeks for suture removal.  Weight Bearing/Load Lower Extremity: full   Implant Name Type Inv. Item Serial No. Manufacturer Lot No. LRB No. Used Action  CEMENT BONE REFOBACIN [id number] US  - [id number] Cement CEMENT BONE REFOBACIN R1X40 US   ZIMMER RECON(ORTH,TRAU,BIO,SG) [id number] Right 2 Implanted  COMP FEM CMT KNEE NRW 5 RT - [id number] Joint COMP FEM CMT KNEE NRW 5 RT  ZIMMER RECON(ORTH,TRAU,BIO,SG) [id number] Right 1 Implanted  STEM TIBIA 5 DEG SZ C R KNEE - [id number] Knees STEM TIBIA 5 DEG SZ C R KNEE  ZIMMER RECON(ORTH,TRAU,BIO,SG) [id number] Right 1 Implanted  STEM POLY PAT PLY 34M KNEE - [id number] Knees STEM POLY PAT PLY 34M KNEE  ZIMMER RECON(ORTH,TRAU,BIO,SG) [id number] Right 1 Implanted  STEM TIB ST PERS 14+30 - [id number] Stem STEM TIB ST PERS 14+30  ZIMMER RECON(ORTH,TRAU,BIO,SG) [id number] Right 1 Implanted  IMPL ASF RT PSN 4-5/CD 10 - [id number] Joint IMPL ASF RT PSN 4-5/CD 10  ZIMMER  RECON(ORTH,TRAU,BIO,SG) [id number] Right 1 Implanted    N. Ozell Cummins, MD [facility] 1:24 PM  "

## 2024-03-22 NOTE — Anesthesia Procedure Notes (Signed)
 Anesthesia Regional Block: Adductor canal block   Pre-Anesthetic Checklist: , timeout performed,  Correct Patient, Correct Site, Correct Laterality,  Correct Procedure, Correct Position, site marked,  Risks and benefits discussed,  Surgical consent,  Pre-op evaluation,  At surgeon's request and post-op pain management  Laterality: Right  Prep: chloraprep       Needles:  Injection technique: Single-shot  Needle Type: Echogenic Needle     Needle Length: 10cm  Needle Gauge: 21     Additional Needles:   Narrative:  Start time: 03/22/2024 11:21 AM End time: 03/22/2024 11:24 AM Injection made incrementally with aspirations every 5 mL.  Performed by: Personally  Anesthesiologist: Lucious Debby BRAVO, MD  Additional Notes: No pain on injection. No increased resistance to injection. Injection made in 5cc increments. Good needle visualization. Patient tolerated the procedure well.

## 2024-03-22 NOTE — Discharge Instructions (Signed)

## 2024-03-22 NOTE — H&P (Signed)
 "   PREOPERATIVE H&P  Chief Complaint: osteoarthritis of right knee  HPI: Jenny Bradford is a 74 y.o. female who presents for surgical treatment of osteoarthritis of right knee.  She denies any changes in medical history.  Past Surgical History:  Procedure Laterality Date   ANKLE SURGERY Right 1980   CARPOMETACARPEL SUSPENSION PLASTY Right 12/14/2014   Procedure: RIGHT THUMB CARPOMETACARPAL (CMC) ARTHROPLASTY;  Surgeon: Kay CHRISTELLA Cummins, MD;  Location: [facility];  Service: Orthopedics;  Laterality: Right;   CARPOMETACARPEL SUSPENSION PLASTY Left 03/19/2017   Procedure: Left Thumb Ligament Reconstruction and Tendon Interposition;  Surgeon: Cummins Kay CHRISTELLA, MD;  Location: [facility];  Service: Orthopedics;  Laterality: Left;   COLONOSCOPY WITH PROPOFOL  N/A 12/07/2013   Procedure: COLONOSCOPY WITH PROPOFOL ;  Surgeon: Gladis MARLA Louder, MD;  Location: [facility];  Service: Endoscopy;  Laterality: N/A;   CYSTECTOMY  1975   FOOT SURGERY Right 12/01/2012   FOOT SURGERY Left 12/10/2011   HEMORRHOID SURGERY  2005   HERNIA REPAIR  2019   paraesophageal hernia   Miscarriage  1983   TONSILLECTOMY  1964   VAGINAL HYSTERECTOMY     With pelvic floor repair   Social History   Socioeconomic History   Marital status: Married    Spouse name: Not on file   Number of children: Not on file   Years of education: Not on file   Highest education level: Not on file  Occupational History   Not on file  Tobacco Use   Smoking status: Never    Passive exposure: Never   Smokeless tobacco: Never  Vaping Use   Vaping status: Never Used  Substance and Sexual Activity   Alcohol use: No    Alcohol/week: 0.0 standard drinks of alcohol   Drug use: No   Sexual activity: Not on file  Other Topics Concern   Not on file  Social History Narrative   Not on file   Social Drivers of Health   Tobacco Use: Low Risk (03/16/2024)   Patient History    Smoking Tobacco Use: Never     Smokeless Tobacco Use: Never    Passive Exposure: Never  Financial Resource Strain: Not on file  Food Insecurity: Not on file  Transportation Needs: Not on file  Physical Activity: Not on file  Stress: Not on file  Social Connections: Not on file  Depression (EYV7-0): Not on file  Alcohol Screen: Not on file  Housing: Unknown (04/22/2023)   Received from [facility]   Epic    Unable to Pay for Housing in the Last Year: Not on file    Number of Times Moved in the Last Year: Not on file    At any time in the past 12 months, were you homeless or living in a shelter (including now)?: No  Utilities: Not on file  Health Literacy: Not on file   Family History  Problem Relation Age of Onset   Breast cancer Mother    Ovarian cancer Mother    Rheum arthritis Mother    Heart attack Father    Heart disease Father    Hypertension Brother    Heart disease Brother    Hyperlipidemia Maternal Grandmother    Hypertension Maternal Grandmother    Polymyalgia rheumatica Paternal Aunt    Hypertension Son    Migraines Daughter    Raynaud syndrome Daughter    Allergies[1] Prior to Admission medications  Medication Sig Start Date End Date Taking?  Authorizing Provider  acetaminophen  (TYLENOL ) 500 MG tablet Take 500 mg by mouth 3 (three) times daily.   Yes [provider]  acidophilus (RISAQUAD) CAPS capsule Take 1 capsule by mouth daily.   Yes [provider]  ALPRAZolam  (XANAX ) 0.25 MG tablet TAKE 1 TABLET BY MOUTH AT BEDTIME AS NEEDED 01/29/21  Yes   butalbital -acetaminophen -caffeine  (FIORICET ) 50-325-40 MG tablet Take 1 tablet by mouth 2 (two) times daily as needed for headache.   Yes [provider]  Capsaicin 0.025 % PTCH Place 1 patch onto the skin daily as needed (knee pain).   Yes [provider]  cephALEXin (KEFLEX) 250 MG capsule Take 250 mg by mouth daily.   Yes [provider]  Cholecalciferol (VITAMIN D ) 50 MCG (2000 UT)  CAPS Take 4,000 Units by mouth daily. 03/11/13  Yes [provider]  CRANBERRY PO Take 1 capsule by mouth daily.   Yes [provider]  docusate sodium  (COLACE) 100 MG capsule Take 1 capsule (100 mg total) by mouth daily as needed. Patient not taking: Reported on 03/12/2024 03/10/24 03/10/25  Jule Ronal CROME, PA-C  escitalopram  (LEXAPRO ) 20 MG tablet Take 10-20 mg by mouth every morning.   Yes [provider]  estradiol (ESTRACE) 0.1 MG/GM vaginal cream Place 2 g vaginally 2 (two) times a week.   Yes [provider]  ezetimibe  (ZETIA ) 10 MG tablet Take 1 tablet (10 mg total) by mouth daily. 01/29/24  Yes Lonni Slain, MD  hydrocortisone  2.5 % cream Apply 1 Application topically daily as needed (irritation).   Yes [provider]  levothyroxine  (SYNTHROID , LEVOTHROID) 50 MCG tablet Take 50 mcg by mouth daily before breakfast.   Yes [provider]  lubiprostone (AMITIZA) 8 MCG capsule Take 8 mcg by mouth 2 (two) times daily with a meal.   Yes [provider]  methocarbamol  (ROBAXIN ) 500 MG tablet Take 1 tablet (500 mg total) by mouth 2 (two) times daily as needed. 03/10/24   Jule Ronal CROME, PA-C  ondansetron  (ZOFRAN ) 4 MG tablet Take 1 tablet (4 mg total) by mouth every 8 (eight) hours as needed for nausea or vomiting. 03/10/24   Jule Ronal CROME, PA-C  oxyCODONE -acetaminophen  (PERCOCET) 5-325 MG tablet Take 1-2 tablets by mouth every 8 (eight) hours as needed. To be taken after surgery 03/10/24   Jule Ronal CROME, PA-C  Peppermint Oil 50 MG CAPS Take 50 mg by mouth daily.   Yes [provider]  polyethylene glycol powder (GLYCOLAX /MIRALAX ) 17 GM/SCOOP powder Take 17 g by mouth daily.   Yes [provider]  pravastatin  (PRAVACHOL ) 40 MG tablet Take 1 tablet (40 mg total) by mouth every evening. 07/29/23 07/28/24 Yes Burnard Debby LABOR, MD  spironolactone  (ALDACTONE ) 25 MG tablet TAKE 1 TABLET BY MOUTH DAILY. IF YOU  HAVE ANY SWELLING IN THE AFTERNOON TAKE 1/2 TABLET AS NEEDED 01/23/23  Yes Burnard Debby LABOR, MD  SUMAtriptan  (IMITREX ) 100 MG tablet TAKE 1 TABLET BY MOUTH AS NEEDED ONE TIME ONCE A DAY 07/30/21  Yes   traMADol  (ULTRAM ) 50 MG tablet Take 1-2 tablets (50-100 mg total) by mouth daily as needed. 12/30/23  Yes Jerri Kay HERO, MD  valsartan  (DIOVAN ) 160 MG tablet Take 0.5 tablets (80 mg total) by mouth 2 (two) times daily. 07/04/23  Yes Burnard Debby LABOR, MD  apixaban  (ELIQUIS ) 2.5 MG TABS tablet Take one tab po bid x 30 days after surgery to prevent blood clots 03/22/24   Jule Ronal CROME, PA-C  doxycycline  (  VIBRA -TABS) 100 MG tablet Take 1 tablet (100 mg total) by mouth 2 (two) times daily. To be taken after surgery 03/17/24   Jule Ronal CROME, PA-C  mupirocin  ointment (BACTROBAN ) 2 % Apply 1 Application topically 2 (two) times daily. Apply to both nostrils twice daily x 5 days 03/17/24   Jule Ronal CROME, PA-C  simethicone  (MYLICON) 80 MG chewable tablet Chew 1 tablet (80 mg total) by mouth every 6 (six) hours as needed for flatulence (bloating). Patient not taking: Reported on 03/12/2024 05/15/17   Signe Mitzie LABOR, MD  traMADol  (ULTRAM ) 50 MG tablet Take 1 tablet (50 mg total) by mouth daily as needed. Patient not taking: Reported on 03/12/2024 04/23/23   Jerri Kay HERO, MD  vitamin C (ASCORBIC ACID) 250 MG tablet Take 250 mg by mouth daily.    [provider]     Positive ROS: All other systems have been reviewed and were otherwise negative with the exception of those mentioned in the HPI and as above.  Physical Exam: General: Alert, no acute distress Cardiovascular: No pedal edema Respiratory: No cyanosis, no use of accessory musculature GI: abdomen soft Skin: No lesions in the area of chief complaint Neurologic: Sensation intact distally Psychiatric: Patient is competent for consent with normal mood and affect Lymphatic: no lymphedema  MUSCULOSKELETAL: exam stable  Assessment: osteoarthritis  of right knee  Plan: Plan for Procedures: ARTHROPLASTY, KNEE, TOTAL  The risks benefits and alternatives were discussed with the patient including but not limited to the risks of nonoperative treatment, versus surgical intervention including infection, bleeding, nerve injury,  blood clots, cardiopulmonary complications, morbidity, mortality, among others, and they were willing to proceed.   Ozell Jerri, MD 03/22/2024 9:00 AM     [1]  Allergies Allergen Reactions   Midol [Aspirin-Cinnamedrine-Caffeine ] Other (See Comments)    DIZZINESS   Olmesartan  Nausea Only   Talwin [Pentazocine] Other (See Comments)    EXTREME DROWSINESS   Bisoprolol Other (See Comments)    weakness   Naproxen Rash   "

## 2024-03-22 NOTE — Transfer of Care (Signed)
 Immediate Anesthesia Transfer of Care Note  Patient: Jenny Bradford  Procedure(s) Performed: ARTHROPLASTY, KNEE, TOTAL (Right: Knee)  Patient Location: PACU  Anesthesia Type:MAC and Spinal  Level of Consciousness: awake and alert   Airway & Oxygen Therapy: Patient Spontanous Breathing  Post-op Assessment: Report given to RN  Post vital signs: Reviewed and stable  Last Vitals:  Vitals Value Taken Time  BP 102/60 03/22/24 14:15  Temp 35 C 03/22/24 14:15  Pulse 48 03/22/24 14:15  Resp 11 03/22/24 14:15  SpO2 97 % 03/22/24 14:15  Vitals shown include unfiled device data.  Last Pain:  Vitals:   03/22/24 1135  TempSrc:   PainSc: 0-No pain      Patients Stated Pain Goal: 0 (03/22/24 0929)  Complications: No notable events documented.

## 2024-03-22 NOTE — Progress Notes (Signed)
 Orthopedic Tech Progress Note Patient Details:  Jenny Bradford 14-Jun-1950 [id number]  Ortho Devices Type of Ortho Device: Bone foam zero knee Ortho Device/Splint Location: RLE Ortho Device/Splint Interventions: Application, Ordered   Post Interventions Patient Tolerated: Well  [name] 03/22/2024, 6:10 PM

## 2024-03-22 NOTE — Anesthesia Procedure Notes (Signed)
 Spinal  Patient location during procedure: OR Start time: 03/22/2024 12:07 PM End time: 03/22/2024 12:08 PM Reason for block: surgical anesthesia  Staffing Authorized by: Lucious Debby BRAVO, MD   Performed by: Mallory Manus, MD  Preanesthetic Checklist Completed: patient identified, IV checked, site marked, risks and benefits discussed, surgical consent, monitors and equipment checked, pre-op evaluation and timeout performed Spinal Block Patient position: sitting Prep: DuraPrep Patient monitoring: heart rate, cardiac monitor, continuous pulse ox and blood pressure Approach: midline Location: L5-S1 Injection technique: single-shot Needle Needle type: Sprotte  Needle gauge: 24 G Needle length: 9 cm Assessment Sensory level: T4 Events: CSF return

## 2024-03-23 ENCOUNTER — Other Ambulatory Visit: Payer: Self-pay | Admitting: Physician Assistant

## 2024-03-23 ENCOUNTER — Encounter (HOSPITAL_COMMUNITY): Payer: Self-pay | Admitting: Orthopaedic Surgery

## 2024-03-23 ENCOUNTER — Telehealth (HOSPITAL_COMMUNITY): Payer: Self-pay

## 2024-03-23 ENCOUNTER — Other Ambulatory Visit (HOSPITAL_COMMUNITY): Payer: Self-pay

## 2024-03-23 DIAGNOSIS — M1711 Unilateral primary osteoarthritis, right knee: Secondary | ICD-10-CM | POA: Diagnosis not present

## 2024-03-23 MED ORDER — PROMETHAZINE HCL 25 MG PO TABS
ORAL_TABLET | ORAL | 1 refills | Status: AC
  Filled 2024-03-23: qty 30, 10d supply, fill #0

## 2024-03-23 MED ORDER — APIXABAN 2.5 MG PO TABS
2.5000 mg | ORAL_TABLET | Freq: Two times a day (BID) | ORAL | 0 refills | Status: DC
  Filled 2024-03-23: qty 60, 30d supply, fill #0

## 2024-03-23 NOTE — TOC Transition Note (Addendum)
 Transition of Care [facility]) - Discharge Note   Patient Details  Name: Jenny Bradford MRN: [id number] Date of Birth: 1950-08-27  [facility] CM/SW Contact:  Marval Gell, RN Phone Number: 03/23/2024, 9:05 AM   Clinical Narrative:     Patient is a Medicare Bundle.  Spoke w patient and spouse at bedside.  HH has been set up prior to admission with [facility], they have been notified of DC.  DME discussed, patient declined 3/1, and knee immobilizer set up by office to be delivered to the home. RW to be delivered to the room through [employer].  Also notified Tylene Ned RN CM of DC. Requested CMA to schedule PCP appointment., attempted to schedule, office will follow up with patient to schedule   Final next level of care: Home w Home Health Services Barriers to Discharge: No Barriers Identified   Patient Goals and CMS Choice Patient states their goals for this hospitalization and ongoing recovery are:: to go home   Choice offered to / list presented to : NA      Discharge Placement                       Discharge Plan and Services Additional resources added to the After Visit Summary for                  DME Arranged: 3-N-1, Walker rolling, CPM DME Agency: [facility], [employer]       HH Arranged: PT HH Agency: [facility] ([facility]        Social Drivers of Health (SDOH) Interventions SDOH Screenings   Housing: Unknown (04/22/2023)   Received from [facility]  Tobacco Use: Low Risk (03/22/2024)     Readmission Risk Interventions     No data to display

## 2024-03-23 NOTE — Evaluation (Signed)
 Occupational Therapy Evaluation Patient Details Name: Jenny Bradford MRN: [id number] DOB: Nov 02, 1950 Today's Date: 03/23/2024   History of Present Illness   74 y.o. female presents 03/22/24 for surgical treatment of osteoarthritis of R knee. S/p R total knee arthroplasty. PMH: OSA, HTN, HLD, bilateral hip pain.     Clinical Impressions PTA Pt reports she was independent with ADLs/IADLs and functional mobility. Pt currently requires up to Mod A for LB ADLs and up to CGA for functional transfers with RW. Pt primarily limited by R knee pain, generalized weakness, and unsteadiness on feet. OT to continue to follow Pt acutely. Anticipate that Pt will not require follow-up OT services post acutely if provided with level of assist as outlined below once medically cleared for d/c.      If plan is discharge home, recommend the following:   A little help with walking and/or transfers;A little help with bathing/dressing/bathroom;Assistance with cooking/housework;Assist for transportation;Help with stairs or ramp for entrance     Functional Status Assessment   Patient has had a recent decline in their functional status and demonstrates the ability to make significant improvements in function in a reasonable and predictable amount of time.     Equipment Recommendations   Other (comment) (Rolling walker)     Recommendations for Other Services         Precautions/Restrictions   Precautions Precautions: Fall Recall of Precautions/Restrictions: Intact Restrictions Weight Bearing Restrictions Per Provider Order: Yes RLE Weight Bearing Per Provider Order: Weight bearing as tolerated     Mobility Bed Mobility Overal bed mobility: Modified Independent             General bed mobility comments: Increased time to return to bed, no physcial assistance needed    Transfers Overall transfer level: Needs assistance Equipment used: Rolling walker (2 wheels) Transfers: Sit  to/from Stand, Bed to chair/wheelchair/BSC Sit to Stand: Contact guard assist     Step pivot transfers: Contact guard assist     General transfer comment: CGA for safety, Pt with good management of RW.      Balance Overall balance assessment: Mild deficits observed, not formally tested                                         ADL either performed or assessed with clinical judgement   ADL Overall ADL's : Needs assistance/impaired Eating/Feeding: Independent;Sitting   Grooming: Independent;Sitting   Upper Body Bathing: Independent;Sitting   Lower Body Bathing: Moderate assistance   Upper Body Dressing : Independent;Sitting   Lower Body Dressing: Moderate assistance   Toilet Transfer: Contact guard assist;Stand-pivot;BSC/3in1;Rolling walker (2 wheels)   Toileting- Clothing Manipulation and Hygiene: Contact guard assist;Sitting/lateral lean               Vision Patient Visual Report: No change from baseline Vision Assessment?: No apparent visual deficits     Perception         Praxis         Pertinent Vitals/Pain Pain Assessment Pain Assessment: Faces Faces Pain Scale: Hurts even more Pain Location: R knee Pain Descriptors / Indicators: Grimacing, Discomfort, Guarding, Shooting Pain Intervention(s): Limited activity within patient's tolerance, Monitored during session, Repositioned, Patient requesting pain meds-RN notified     Extremity/Trunk Assessment Upper Extremity Assessment Upper Extremity Assessment: Generalized weakness   Lower Extremity Assessment Lower Extremity Assessment: Defer to PT evaluation   Cervical / Trunk Assessment  Cervical / Trunk Assessment: Normal   Communication Communication Communication: No apparent difficulties   Cognition Arousal: Alert Behavior During Therapy: WFL for tasks assessed/performed Cognition: No apparent impairments                               Following commands: Intact        Cueing  General Comments   Cueing Techniques: Verbal cues  VSS on RA. Husband present near end of session. Encouraging and supportive of Pt. Husband stated he will be assisting with LB ADLs as needed when returning to home.   Exercises     Shoulder Instructions      Home Living Family/patient expects to be discharged to:: Private residence Living Arrangements: Spouse/significant other Available Help at Discharge: Family;Neighbor;Friend(s);Available PRN/intermittently Type of Home: House Home Access: Stairs to enter [employer] of Steps: 5 Entrance Stairs-Rails: Left;Right;Can reach both Home Layout: Two level;1/2 bath on main level;Bed/bath upstairs;Other (Comment)     Bathroom Shower/Tub: Chief Strategy Officer: Standard     Home Equipment: Cane - single point;Toilet riser;Tub bench   Additional Comments: Planning to sleep on main level, would need to go upstairs to bathe. Stated that she could do sponge baths if needed while recovering      Prior Functioning/Environment Prior Level of Function : Independent/Modified Independent             Mobility Comments: Independent ADLs Comments: Independent    OT Problem List: Decreased strength;Decreased activity tolerance;Impaired balance (sitting and/or standing);Decreased knowledge of use of DME or AE;Decreased knowledge of precautions;Pain   OT Treatment/Interventions: Self-care/ADL training;Therapeutic exercise;Energy conservation;DME and/or AE instruction;Therapeutic activities;Patient/family education;Balance training      OT Goals(Current goals can be found in the care plan section)   Acute Rehab OT Goals Patient Stated Goal: to go home OT Goal Formulation: With patient Time For Goal Achievement: 04/06/24 Potential to Achieve Goals: Good ADL Goals Pt Will Perform Lower Body Bathing: with modified independence;sitting/lateral leans;with adaptive equipment Pt Will Perform Lower  Body Dressing: with modified independence;with adaptive equipment;sitting/lateral leans Pt Will Transfer to Toilet: with modified independence;bedside commode;stand pivot transfer   OT Frequency:  Min 1X/week    Co-evaluation              AM-PAC OT 6 Clicks Daily Activity     Outcome Measure Help from another person eating meals?: None Help from another person taking care of personal grooming?: None Help from another person toileting, which includes using toliet, bedpan, or urinal?: A Little Help from another person bathing (including washing, rinsing, drying)?: A Lot Help from another person to put on and taking off regular upper body clothing?: None Help from another person to put on and taking off regular lower body clothing?: A Lot 6 Click Score: 19   End of Session Equipment Utilized During Treatment: Rolling walker (2 wheels) Nurse Communication: Mobility status;Patient requests pain meds  Activity Tolerance: Patient tolerated treatment well Patient left: in bed;with call bell/phone within reach;with family/visitor present  OT Visit Diagnosis: Unsteadiness on feet (R26.81);Muscle weakness (generalized) (M62.81);Pain Pain - Right/Left: Right Pain - part of body: Knee                Time: 9194-9169 OT Time Calculation (min): 25 min Charges:  OT General Charges $OT Visit: 1 Visit OT Evaluation $OT Eval Low Complexity: 1 Low OT Treatments $Self Care/Home Management : 8-22 mins  Flemon Kelty L, OTR/L.  [facility] Acute Rehabilitation  Office: [phone]   Maurilio PARAS Ebonye Reade 03/23/2024, 8:40 AM

## 2024-03-23 NOTE — Progress Notes (Signed)
 Subjective: 1 Day Post-Op Procedures (LRB): ARTHROPLASTY, KNEE, TOTAL (Right) Patient reports pain as moderate.    Objective: Vital signs in last 24 hours: Temp:  [95 F (35 C)-98.2 F (36.8 C)] 98.2 F (36.8 C) (01/13 0602) Pulse Rate:  [48-76] 76 (01/13 0602) Resp:  [7-19] 18 (01/13 0602) BP: (102-171)/(58-86) 139/58 (01/13 0602) SpO2:  [92 %-100 %] 98 % (01/13 0602) Weight:  [72.6 kg-74 kg] 74 kg (01/12 1737)  Intake/Output from previous day: 01/12 0701 - 01/13 0700 In: 1297.1 [P.O.:360; I.V.:537.1; IV Piggyback:400] Out: 1660 [Urine:1650; Blood:10] Intake/Output this shift: No intake/output data recorded.  No results for input(s): HGB in the last 72 hours. No results for input(s): WBC, RBC, HCT, PLT in the last 72 hours. No results for input(s): NA, K, CL, CO2, BUN, CREATININE, GLUCOSE, CALCIUM  in the last 72 hours. No results for input(s): LABPT, INR in the last 72 hours.  Neurologically intact Neurovascular intact Sensation intact distally Intact pulses distally Dorsiflexion/Plantar flexion intact Incision: dressing C/D/I No cellulitis present Compartment soft   Assessment/Plan: 1 Day Post-Op Procedures (LRB): ARTHROPLASTY, KNEE, TOTAL (Right) Advance diet Up with therapy D/C IV fluids Discharge home with home health once cleared by PT Eliquis  and phenergan  sent to [facility] RLE       Jenny Bradford Grave 03/23/2024, 8:29 AM

## 2024-03-23 NOTE — Telephone Encounter (Signed)
 Pharmacy Patient Advocate Encounter  Received notification from [facility] that Prior Authorization for Promethazine  HCl 25MG  tablets has been APPROVED from 03/23/24 to 03/10/2098   PA #/Case ID/Reference #: [id number]

## 2024-03-23 NOTE — Care Management Obs Status (Signed)
 MEDICARE OBSERVATION STATUS NOTIFICATION   Patient Details  Name: Jenny Bradford MRN: [id number] Date of Birth: 1950/06/17   Medicare Observation Status Notification Given:  Yes Obs letter signed and copy given    Claretta Deed 03/23/2024, 10:38 AM

## 2024-03-23 NOTE — Discharge Summary (Signed)
 Patient ID: Jenny Bradford MRN: [id number] DOB/AGE: 1950-04-30 75 y.o.  Admit date: 03/22/2024 Discharge date: 03/23/2024  Admission Diagnoses:  Principal Problem:   Primary osteoarthritis of right knee Active Problems:   Status post total right knee replacement   Discharge Diagnoses:  Same  Past Medical History:  Diagnosis Date   AAA (abdominal aortic aneurysm) 08/2014   scanning every 2 years   Arthritis    oa   Complication of anesthesia    slow to awaken in past   Family history of anesthesia complication    slow to awaken   GERD (gastroesophageal reflux disease)    Grade II diastolic dysfunction 04/2017   Noted on ECHO   H/O hiatal hernia    Headache(784.0)    migraines (rare)   Hemorrhoids    Hyperlipidemia    Hypertension 04/01/2011   ECHO-EF>55% NUC STRESS TEST- 05/01/12   Hypothyroidism    Mild aortic valve regurgitation 04/2017   Noted on ECHO   Mitral valve regurgitation 04/2017   Mild: Noted on ECHO   PONV (postoperative nausea and vomiting)    Pulmonary nodules 2018   scanning every 6 months   Sleep apnea 05/01/12 & 05/29/12   SLEEP STUDY-[facility], NO CPAP USED SINCE MAY 2015 Uses oral device    Surgeries: Procedures: ARTHROPLASTY, KNEE, TOTAL on 03/22/2024   Consultants:   Discharged Condition: Improved  Hospital Course: Jenny Bradford is an 74 y.o. female who was admitted 03/22/2024 for operative treatment ofPrimary osteoarthritis of right knee. Patient has severe unremitting pain that affects sleep, daily activities, and work/hobbies. After pre-op clearance the patient was taken to the operating room on 03/22/2024 and underwent  Procedures: ARTHROPLASTY, KNEE, TOTAL.    Patient was given perioperative antibiotics:  Anti-infectives (From admission, onward)    Start     Dose/Rate Route Frequency Ordered Stop   03/22/24 1800  ceFAZolin  (ANCEF ) IVPB 2g/100 mL premix        2 g 200 mL/hr over 30 Minutes Intravenous Every 6  hours 03/22/24 1704 03/23/24 0429   03/22/24 1244  vancomycin  (VANCOCIN ) powder  Status:  Discontinued          As needed 03/22/24 1245 03/22/24 1350   03/22/24 0930  ceFAZolin  (ANCEF ) IVPB 2g/100 mL premix        2 g 200 mL/hr over 30 Minutes Intravenous On call to O.R. 03/22/24 0925 03/22/24 1212   03/22/24 0930  vancomycin  (VANCOCIN ) IVPB 1000 mg/200 mL premix        1,000 mg 200 mL/hr over 60 Minutes Intravenous  Once 03/22/24 0925 03/22/24 1741        Patient was given sequential compression devices, early ambulation, and chemoprophylaxis to prevent DVT.  Inpatient Morphine Milligram Equivalents Per Day 1/12 - 1/13   Values displayed are in units of MME/Day    Order Start / End Date Yesterday Today    oxyCODONE  (Oxy IR/ROXICODONE ) immediate release tablet 5 mg 1/12 - 1/12 0 of Unknown --    oxyCODONE  (ROXICODONE ) 5 MG/5ML solution 5 mg 1/12 - 1/12 0 of Unknown --      Group total: 0 of Unknown     fentaNYL  (SUBLIMAZE ) injection 25-50 mcg 1/12 - 1/12 45 of 45-90 --    fentaNYL  (SUBLIMAZE ) 100 MCG/2ML injection 1/12 - 1/12 0 of Unknown --    oxyCODONE  (Oxy IR/ROXICODONE ) immediate release tablet 5 mg 1/12 - No end date 0 of 15 7.5 of 30    oxyCODONE  (  Oxy IR/ROXICODONE ) immediate release tablet 10 mg 1/12 - No end date 15 of 30 0 of 60    HYDROmorphone  (DILAUDID ) injection 1 mg 1/12 - No end date 20 of 20 0 of 60    fentaNYL  (SUBLIMAZE ) injection 50 mcg 1/12 - 1/12 15 of 15 --    Daily Totals  95 of Unknown (at least 125-170) 7.5 of 150    Calculation Errors     Order Type Date Details   oxyCODONE  (Oxy IR/ROXICODONE ) immediate release tablet 5 mg Ordered Dose -- Insufficient frequency information   oxyCODONE  (ROXICODONE ) 5 MG/5ML solution 5 mg Ordered Dose -- Insufficient frequency information   fentaNYL  (SUBLIMAZE ) 100 MCG/2ML injection Ordered Dose -- Frequency type could not be determined            Patient benefited maximally from hospital stay and there were no  complications.    Recent vital signs: Patient Vitals for the past 24 hrs:  BP Temp Temp src Pulse Resp SpO2 Height Weight  03/23/24 0602 (!) 139/58 98.2 F (36.8 C) Oral 76 18 98 % -- --  03/23/24 0100 (!) 132/58 97.7 F (36.5 C) Oral 70 18 99 % -- --  03/22/24 2038 (!) 156/79 98 F (36.7 C) -- 70 19 100 % -- --  03/22/24 1737 (!) 171/72 97.8 F (36.6 C) Oral -- 18 100 % -- 74 kg  03/22/24 1657 -- 97.9 F (36.6 C) -- -- -- -- -- --  03/22/24 1645 (!) 158/86 (!) 97.3 F (36.3 C) -- 67 18 96 % -- --  03/22/24 1630 (!) 169/77 (!) 97 F (36.1 C) -- 69 10 97 % -- --  03/22/24 1615 (!) 165/71 (!) 96.8 F (36 C) -- 67 11 96 % -- --  03/22/24 1600 (!) 160/79 (!) 96.4 F (35.8 C) -- 66 13 97 % -- --  03/22/24 1545 (!) 148/75 (!) 96.1 F (35.6 C) -- (!) 58 (!) 7 98 % -- --  03/22/24 1530 128/62 (!) 95.9 F (35.5 C) -- (!) 58 11 100 % -- --  03/22/24 1515 134/64 (!) 95.5 F (35.3 C) -- 60 12 97 % -- --  03/22/24 1500 135/67 (!) 95.4 F (35.2 C) -- (!) 52 10 95 % -- --  03/22/24 1445 129/65 (!) 95.2 F (35.1 C) -- (!) 49 10 93 % -- --  03/22/24 1430 126/65 (!) 95 F (35 C) -- (!) 48 11 92 % -- --  03/22/24 1415 102/60 (!) 95 F (35 C) -- (!) 51 16 96 % -- --  03/22/24 1400 113/74 -- -- 61 12 97 % -- --  03/22/24 1354 113/73 -- -- 64 13 98 % -- --  03/22/24 1150 (!) 146/69 -- -- (!) 57 10 100 % -- --  03/22/24 1145 (!) 141/66 -- -- (!) 58 10 100 % -- --  03/22/24 1140 127/69 -- -- 60 10 100 % -- --  03/22/24 1138 133/63 -- -- 60 (!) 9 100 % -- --  03/22/24 1135 115/70 -- -- 68 (!) 8 100 % -- --  03/22/24 0904 (!) 146/86 97.8 F (36.6 C) Oral 75 17 99 % 5' 1.5 (1.562 m) 72.6 kg     Recent laboratory studies: No results for input(s): WBC, HGB, HCT, PLT, NA, K, CL, CO2, BUN, CREATININE, GLUCOSE, INR, CALCIUM  in the last 72 hours.  Invalid input(s): PT, 2   Discharge Medications:   Allergies as of 03/23/2024  Reactions   Midol  [aspirin-cinnamedrine-caffeine ] Other (See Comments)   DIZZINESS   Olmesartan  Nausea Only   Talwin [pentazocine] Other (See Comments)   EXTREME DROWSINESS   Bisoprolol Other (See Comments)   weakness   Naproxen Rash        Medication List     STOP taking these medications    acetaminophen  500 MG tablet Commonly known as: TYLENOL    ondansetron  4 MG tablet Commonly known as: Zofran    traMADol  50 MG tablet Commonly known as: ULTRAM        TAKE these medications    acidophilus Caps capsule Take 1 capsule by mouth daily.   ALPRAZolam  0.25 MG tablet Commonly known as: XANAX  TAKE 1 TABLET BY MOUTH AT BEDTIME AS NEEDED   apixaban  2.5 MG Tabs tablet Commonly known as: Eliquis  Take one tab po bid x 30 days after surgery to prevent blood clots   butalbital -acetaminophen -caffeine  50-325-40 MG tablet Commonly known as: FIORICET  Take 1 tablet by mouth 2 (two) times daily as needed for headache.   Capsaicin 0.025 % Ptch Place 1 patch onto the skin daily as needed (knee pain).   cephALEXin 250 MG capsule Commonly known as: KEFLEX Take 250 mg by mouth daily.   chlorhexidine  4 % external liquid Commonly known as: HIBICLENS  Apply 15 mLs (1 Application total) topically as directed for 30 doses. Use as directed daily for 5 days every other week for 6 weeks.   CRANBERRY PO Take 1 capsule by mouth daily.   docusate sodium  100 MG capsule Commonly known as: Colace Take 1 capsule (100 mg total) by mouth daily as needed.   doxycycline  100 MG tablet Commonly known as: VIBRA -TABS Take 1 tablet (100 mg total) by mouth 2 (two) times daily. To be taken after surgery   escitalopram  20 MG tablet Commonly known as: LEXAPRO  Take 10-20 mg by mouth every morning.   estradiol 0.1 MG/GM vaginal cream Commonly known as: ESTRACE Place 2 g vaginally 2 (two) times a week.   ezetimibe  10 MG tablet Commonly known as: ZETIA  Take 1 tablet (10 mg total) by mouth daily.   hydrocortisone   2.5 % cream Apply 1 Application topically daily as needed (irritation).   levothyroxine  50 MCG tablet Commonly known as: SYNTHROID  Take 50 mcg by mouth daily before breakfast.   lubiprostone 8 MCG capsule Commonly known as: AMITIZA Take 8 mcg by mouth 2 (two) times daily with a meal.   methocarbamol  500 MG tablet Commonly known as: ROBAXIN  Take 1 tablet (500 mg total) by mouth 2 (two) times daily as needed.   mupirocin  ointment 2 % Commonly known as: BACTROBAN  Apply 1 Application topically 2 (two) times daily. Apply to both nostrils twice daily x 5 days What changed: Another medication with the same name was added. Make sure you understand how and when to take each.   mupirocin  ointment 2 % Commonly known as: BACTROBAN  Place 1 Application into the nose 2 (two) times daily for 60 doses. Use as directed 2 times daily for 5 days every other week for 6 weeks. What changed: You were already taking a medication with the same name, and this prescription was added. Make sure you understand how and when to take each.   oxyCODONE -acetaminophen  5-325 MG tablet Commonly known as: Percocet Take 1-2 tablets by mouth every 8 (eight) hours as needed. To be taken after surgery   Peppermint Oil 50 MG Caps Take 50 mg by mouth daily.   polyethylene glycol powder 17 GM/SCOOP powder Commonly  known as: GLYCOLAX /MIRALAX  Take 17 g by mouth daily.   pravastatin  40 MG tablet Commonly known as: PRAVACHOL  Take 1 tablet (40 mg total) by mouth every evening.   promethazine  25 MG tablet Commonly known as: PHENERGAN  Take 1/2-1 tab po tid prn nausea/vomiting   simethicone  80 MG chewable tablet Commonly known as: MYLICON Chew 1 tablet (80 mg total) by mouth every 6 (six) hours as needed for flatulence (bloating).   spironolactone  25 MG tablet Commonly known as: ALDACTONE  TAKE 1 TABLET BY MOUTH DAILY. IF YOU HAVE ANY SWELLING IN THE AFTERNOON TAKE 1/2 TABLET AS NEEDED   SUMAtriptan  100 MG  tablet Commonly known as: IMITREX  TAKE 1 TABLET BY MOUTH AS NEEDED ONE TIME ONCE A DAY   valsartan  160 MG tablet Commonly known as: DIOVAN  Take 0.5 tablets (80 mg total) by mouth 2 (two) times daily.   vitamin C 250 MG tablet Commonly known as: ASCORBIC ACID Take 250 mg by mouth daily.   Vitamin D  50 MCG (2000 UT) Caps Take 4,000 Units by mouth daily.               Durable Medical Equipment  (From admission, onward)           Start     Ordered   03/22/24 1705  DME Walker rolling  Once       Question Answer Comment  Walker: With 5 Inch Wheels   Patient needs a walker to treat with the following condition Status post left partial knee replacement      03/22/24 1704   03/22/24 1705  DME 3 n 1  Once        03/22/24 1704   03/22/24 1705  DME Bedside commode  Once       Question:  Patient needs a bedside commode to treat with the following condition  Answer:  Status post left partial knee replacement   03/22/24 1704            Diagnostic Studies: DG Knee Right Port Result Date: 03/22/2024 EXAM: 1 or 2 VIEW(S) XRAY OF THE KNEE 03/22/2024 02:21:00 PM COMPARISON: None available. CLINICAL HISTORY: Pain FINDINGS: BONES AND JOINTS: Total knee arthroplasty noted. Normal alignment. No acute fracture. No dislocation. Expected joint effusion. SOFT TISSUES: Expected soft tissue gas. IMPRESSION: 1. Total knee arthroplasty with normal alignment. 2. No unexpected fracture or dislocation. 3. Joint effusion and soft tissue gas, expected postoperatively. Electronically signed by: Dorethia Molt MD MD 03/22/2024 11:55 PM EST RP Workstation: [id number]    Disposition: Discharge disposition: 01-Home or Self Care          Follow-up Information     Jerri Kay HERO, MD. Go on 04/06/2024.   Specialty: Orthopedic Surgery Why: at 1:00pm for your first in office post operative appointment with Dr. Jerri Pass information: [address] [place]  [place] [address] [phone]                   Signed: [name] Grave 03/23/2024, 8:34 AM

## 2024-03-23 NOTE — Progress Notes (Signed)
 Physical Therapy Treatment Patient Details Name: Jenny Bradford MRN: [id number] DOB: 1950-04-11 Today's Date: 03/23/2024   History of Present Illness 74 y.o. female presents 03/22/24 for surgical treatment of osteoarthritis of R knee. S/p R total knee arthroplasty. PMH: OSA, HTN, HLD, bilateral hip pain.    PT Comments  Safely completed stair training this afternoon with set-up similar to home environment. Utilized lateral approach with bil hands to rail. Husband present and supportive, observing session and both feel confident with technique we reviewed today for home entry. Able to ambulate safely up to 75 feet with light RW support, focusing on gait symmetry, progressing with step-through pattern. Reviewed HEP, precautions. All questions answered. Acute rehab goals met, adequate for d/c from a therapy standpoint when medically ready.     If plan is discharge home, recommend the following: A little help with bathing/dressing/bathroom;Assistance with cooking/housework;Assist for transportation;Help with stairs or ramp for entrance   Can travel by private vehicle        Equipment Recommendations  Rolling walker (2 wheels)    Recommendations for Other Services       Precautions / Restrictions Precautions Precautions: Fall;Knee Precaution Booklet Issued: Yes (comment) Recall of Precautions/Restrictions: Intact Restrictions Weight Bearing Restrictions Per Provider Order: Yes RLE Weight Bearing Per Provider Order: Weight bearing as tolerated     Mobility  Bed Mobility Overal bed mobility: Modified Independent             General bed mobility comments: extra time, no physical assist.    Transfers Overall transfer level: Needs assistance Equipment used: Rolling walker (2 wheels) Transfers: Sit to/from Stand Sit to Stand: Supervision           General transfer comment: Supervision for safety, performed multiple times from bed with RW. No assist needed this  afternoon.    Ambulation/Gait Ambulation/Gait assistance: Supervision Gait Distance (Feet): 75 Feet Assistive device: Rolling walker (2 wheels) Gait Pattern/deviations: Step-to pattern, Decreased stance time - right, Decreased weight shift to right, Antalgic, Decreased stride length, Step-through pattern Gait velocity: dec Gait velocity interpretation: <1.31 ft/sec, indicative of household ambulator Pre-gait activities: Weight shift and acceptance with RW for support, CGA for safety prior to ambulating. General Gait Details: Supervision for safety, still antalgic but emerging step through pattern with good carry over from initial session. No buckling. She is able to bear full weight briefly. using RW appropriately for support.   Stairs Stairs: Yes Stairs assistance: Supervision Stair Management: One rail Right, Step to pattern, Sideways Number of Stairs: 4 (1x4) General stair comments: 6 box utiized to practice navigating steps with single rail set-up similar to home entrance. Leading up with LUE and down with RLE. No buckling with RLE weight bearing. Performed safely. Feels confident this can be completed at home.   Wheelchair Mobility     Tilt Bed    Modified Rankin (Stroke Patients Only)       Balance Overall balance assessment: Needs assistance Sitting-balance support: No upper extremity supported, Feet supported Sitting balance-Leahy Scale: Good     Standing balance support: During functional activity, No upper extremity supported Standing balance-Leahy Scale: Fair Standing balance comment: Stood safely without UE support in front of bed.                            Communication Communication Communication: No apparent difficulties  Cognition Arousal: Alert Behavior During Therapy: WFL for tasks assessed/performed   PT - Cognitive impairments: No apparent  impairments                         Following commands: Intact      Cueing  Cueing Techniques: Verbal cues  Exercises General Exercises - Lower Extremity Ankle Circles/Pumps: AROM, Both, 10 reps, Seated Quad Sets: Strengthening, Supine, Both, 5 reps Long Arc Quad: Strengthening, AROM, Both, 5 reps, Seated    General Comments General comments (skin integrity, edema, etc.): Husband present supportive, observed.      Pertinent Vitals/Pain Pain Assessment Pain Assessment: Faces Faces Pain Scale: Hurts even more Pain Location: R knee Pain Descriptors / Indicators: Grimacing, Discomfort, Guarding, Shooting Pain Intervention(s): Monitored during session, Repositioned, Limited activity within patient's tolerance, Premedicated before session    Home Living Family/patient expects to be discharged to:: Private residence Living Arrangements: Spouse/significant other Available Help at Discharge: Family;Neighbor;Friend(s);Available PRN/intermittently Type of Home: House Home Access: Stairs to enter Entrance Stairs-Rails: Left;Right;Can reach both Entrance Stairs-Number of Steps: 5 Alternate Level Stairs-Number of Steps: 13 Home Layout: Two level;1/2 bath on main level;Bed/bath upstairs;Other (Comment) Home Equipment: Cane - single point;Toilet riser;Tub bench;Rolling Walker (2 wheels);BSC/3in1 (Devices have been arranged for delivery per [facility]) Additional Comments: Planning to sleep on main level, would need to go upstairs to bathe. Stated that she could do sponge baths if needed while recovering    Prior Function            PT Goals (current goals can now be found in the care plan section) Acute Rehab PT Goals Patient Stated Goal: Get home soon PT Goal Formulation: With patient Time For Goal Achievement: 04/06/24 Potential to Achieve Goals: Good Progress towards PT goals: Progressing toward goals    Frequency    7X/week      PT Plan      Co-evaluation              AM-PAC PT 6 Clicks Mobility   Outcome Measure  Help needed turning from your  back to your side while in a flat bed without using bedrails?: None Help needed moving from lying on your back to sitting on the side of a flat bed without using bedrails?: None Help needed moving to and from a bed to a chair (including a wheelchair)?: A Little Help needed standing up from a chair using your arms (e.g., wheelchair or bedside chair)?: A Little Help needed to walk in hospital room?: A Little Help needed climbing 3-5 steps with a railing? : A Little 6 Click Score: 20    End of Session   Activity Tolerance: Patient tolerated treatment well Patient left: in bed;with call bell/phone within reach;with family/visitor present (Ice to Rt knee, bone foam in place) Nurse Communication: Mobility status PT Visit Diagnosis: Unsteadiness on feet (R26.81);Other abnormalities of gait and mobility (R26.89);Difficulty in walking, not elsewhere classified (R26.2);Pain Pain - Right/Left: Right Pain - part of body: Knee     Time: 1225-1247 PT Time Calculation (min) (ACUTE ONLY): 22 min  Charges:    $Gait Training: 8-22 mins $Therapeutic Activity: 8-22 mins PT General Charges $$ ACUTE PT VISIT: 1 Visit                     Leontine Roads, PT, DPT [facility]  Rehabilitation Services Physical Therapist Office: [phone] Website: [email or web address]    Leontine GORMAN Roads 03/23/2024, 1:52 PM

## 2024-03-23 NOTE — Evaluation (Signed)
 Physical Therapy Evaluation Patient Details Name: Jenny Bradford MRN: [id number] DOB: [birth year] Today's Date: 03/23/2024  History of Present Illness  74 y.o. female presents 03/22/24 for surgical treatment of osteoarthritis of R knee. S/p R total knee arthroplasty. PMH: OSA, HTN, HLD, bilateral hip pain.  Clinical Impression  Pt is s/p TKA presenting with deficits listed below (see PT Problem List). Previously independent, active, likes visiting with grandchildren, and lives with her husband who will be with her at d/c. Able to ambulate into restroom and back to bed, followed by another short distance around room this morning. Will have several steps to navigate at home which we plan to practice early this afternoon. Pt requires RW for UE support, shows adequate strength and WB through operative extremity to navigate safely with supervision. Reviewed precautions, LE exercises, ice use, and bone foam. Reports equipment and HHPT ordered for d/c. Pt will benefit from acute skilled PT to increase their independence and safety with mobility to allow discharge.          If plan is discharge home, recommend the following: A little help with bathing/dressing/bathroom;A little help with walking and/or transfers;Assistance with cooking/housework;Assist for transportation;Help with stairs or ramp for entrance   Can travel by private vehicle        Equipment Recommendations Rolling walker (2 wheels)  Recommendations for Other Services       Functional Status Assessment Patient has had a recent decline in their functional status and demonstrates the ability to make significant improvements in function in a reasonable and predictable amount of time.     Precautions / Restrictions Precautions Precautions: Fall;Knee Precaution Booklet Issued: Yes (comment) Recall of Precautions/Restrictions: Intact Restrictions Weight Bearing Restrictions Per Provider Order: Yes RLE Weight Bearing Per Provider  Order: Weight bearing as tolerated      Mobility  Bed Mobility Overal bed mobility: Modified Independent             General bed mobility comments: extra time, no physical assist.    Transfers Overall transfer level: Needs assistance Equipment used: Rolling walker (2 wheels) Transfers: Sit to/from Stand Sit to Stand: Contact guard assist, Min assist           General transfer comment: CGA to rise from bed with cues for hand placement. Practiced x2. Required min assist to rise from toilet with rail on Rt. (very low toilet).    Ambulation/Gait Ambulation/Gait assistance: Supervision Gait Distance (Feet): 32 Feet (15, 15 (to and from bathroom initially)) Assistive device: Rolling walker (2 wheels) Gait Pattern/deviations: Step-to pattern, Decreased stance time - right, Decreased weight shift to right, Antalgic, Decreased stride length Gait velocity: dec Gait velocity interpretation: <1.31 ft/sec, indicative of household ambulator Pre-gait activities: Weight shift and acceptance with RW for support, CGA for safety prior to ambulating. General Gait Details: Educated on RW use, adjuated for appropriate height and efficiency. Supervision for safety without overt buckling or LOB noticed. Good proximity to device. Antalgic as expected post-op. Step-to pattern with cues to progress with step through and heel strike as tolerated.  Stairs            Wheelchair Mobility     Tilt Bed    Modified Rankin (Stroke Patients Only)       Balance Overall balance assessment: Needs assistance Sitting-balance support: No upper extremity supported, Feet supported Sitting balance-Leahy Scale: Good     Standing balance support: Single extremity supported, Reliant on assistive device for balance, During functional activity Standing balance-Leahy Scale: Poor  Standing balance comment: RW for support                             Pertinent Vitals/Pain Pain  Assessment Pain Assessment: PAINAD Faces Pain Scale: Hurts even more Pain Location: R knee Pain Descriptors / Indicators: Grimacing, Discomfort, Guarding, Shooting Pain Intervention(s): Limited activity within patient's tolerance, Monitored during session, Premedicated before session, Repositioned, Ice applied    Home Living Family/patient expects to be discharged to:: Private residence Living Arrangements: Spouse/significant other Available Help at Discharge: Family;Neighbor;Friend(s);Available PRN/intermittently Type of Home: House Home Access: Stairs to enter Entrance Stairs-Rails: Left;Right;Can reach both Entrance Stairs-Number of Steps: 5 Alternate Level Stairs-Number of Steps: 13 Home Layout: Two level;1/2 bath on main level;Bed/bath upstairs;Other (Comment) Home Equipment: Cane - single point;Toilet riser;Tub bench;Rolling Walker (2 wheels);BSC/3in1 (Devices have been arranged for delivery per [facility]) Additional Comments: Planning to sleep on main level, would need to go upstairs to bathe. Stated that she could do sponge baths if needed while recovering    Prior Function Prior Level of Function : Independent/Modified Independent             Mobility Comments: Independent ADLs Comments: Independent     Extremity/Trunk Assessment   Upper Extremity Assessment Upper Extremity Assessment: Defer to OT evaluation    Lower Extremity Assessment Lower Extremity Assessment: RLE deficits/detail RLE Deficits / Details: post op guarding as expected    Cervical / Trunk Assessment Cervical / Trunk Assessment: Normal  Communication   Communication Communication: No apparent difficulties    Cognition Arousal: Alert Behavior During Therapy: WFL for tasks assessed/performed   PT - Cognitive impairments: No apparent impairments                         Following commands: Intact       Cueing Cueing Techniques: Verbal cues     General Comments General comments  (skin integrity, edema, etc.): Reviewed precautions, ice use, zero knee (bone foam), activity progression and symptom awareness.    Exercises General Exercises - Lower Extremity Ankle Circles/Pumps: AROM, Both, 10 reps, Seated Quad Sets: Strengthening, Supine, Both, 5 reps Long Arc Quad: Strengthening, AROM, Both, 5 reps, Seated   Assessment/Plan    PT Assessment Patient needs continued PT services  PT Problem List Decreased strength;Decreased range of motion;Decreased activity tolerance;Decreased balance;Decreased mobility;Decreased knowledge of use of DME;Decreased knowledge of precautions;Pain       PT Treatment Interventions DME instruction;Gait training;Stair training;Functional mobility training;Therapeutic activities;Balance training;Therapeutic exercise;Neuromuscular re-education;Patient/family education;Manual techniques;Modalities    PT Goals (Current goals can be found in the Care Plan section)  Acute Rehab PT Goals Patient Stated Goal: Get home soon PT Goal Formulation: With patient Time For Goal Achievement: 04/06/24 Potential to Achieve Goals: Good    Frequency 7X/week     Co-evaluation               AM-PAC PT 6 Clicks Mobility  Outcome Measure Help needed turning from your back to your side while in a flat bed without using bedrails?: None Help needed moving from lying on your back to sitting on the side of a flat bed without using bedrails?: None Help needed moving to and from a bed to a chair (including a wheelchair)?: A Little Help needed standing up from a chair using your arms (e.g., wheelchair or bedside chair)?: A Little Help needed to walk in hospital room?: A Little Help needed climbing 3-5 steps  with a railing? : A Little 6 Click Score: 20    End of Session   Activity Tolerance: Patient tolerated treatment well Patient left: in bed;with call bell/phone within reach;with family/visitor present (Ice to Rt knee, bone foam in place) Nurse  Communication: Mobility status PT Visit Diagnosis: Unsteadiness on feet (R26.81);Other abnormalities of gait and mobility (R26.89);Difficulty in walking, not elsewhere classified (R26.2);Pain Pain - Right/Left: Right Pain - part of body: Knee    Time: 9040-8965 PT Time Calculation (min) (ACUTE ONLY): 35 min   Charges:   PT Evaluation $PT Eval Low Complexity: 1 Low PT Treatments $Therapeutic Activity: 8-22 mins PT General Charges $$ ACUTE PT VISIT: 1 Visit         Leontine Roads, PT, DPT [facility] Services Physical Therapist Office: [phone] Website: [email or web address]   Leontine GORMAN Roads 03/23/2024, 12:03 PM

## 2024-03-24 NOTE — Progress Notes (Unsigned)
 "  Office Visit Note  Patient: Jenny Bradford             Date of Birth: 10-12-1950           MRN: [id number]             PCP: Gerome Brunet, DO Referring: Gerome Brunet, DO Visit Date: 04/07/2024 Occupation: Data Unavailable  Subjective:  No chief complaint on file.   History of Present Illness: Jenny Bradford is a 74 y.o. female ***     Activities of Daily Living:  Patient reports morning stiffness for *** {minute/hour:19697}.   Patient {ACTIONS;DENIES/REPORTS:21021675::Denies} nocturnal pain.  Difficulty dressing/grooming: {ACTIONS;DENIES/REPORTS:21021675::Denies} Difficulty climbing stairs: {ACTIONS;DENIES/REPORTS:21021675::Denies} Difficulty getting out of chair: {ACTIONS;DENIES/REPORTS:21021675::Denies} Difficulty using hands for taps, buttons, cutlery, and/or writing: {ACTIONS;DENIES/REPORTS:21021675::Denies}  No Rheumatology ROS completed.   PMFS History:  Patient Active Problem List   Diagnosis Date Noted   Status post total right knee replacement 03/22/2024   Arthritis of right midfoot 02/27/2023   Bilateral hip pain 01/02/2021   Primary osteoarthritis of left knee 01/26/2019   Primary osteoarthritis of right knee 01/26/2019   Palpitations 09/04/2017   Family history of coronary artery disease in father 09/04/2017   Paraesophageal hernia 05/13/2017   Neck pain 01/03/2017   Large hiatal hernia 12/28/2016   Multiple pulmonary nodules determined by computed tomography of lung 12/27/2016   Arthritis of carpometacarpal (CMC) joint of left thumb 05/28/2016   OSA (obstructive sleep apnea) 10/05/2012   HTN (hypertension) 10/05/2012   Hyperlipidemia 10/05/2012   Migraine headache 10/05/2012    Past Medical History:  Diagnosis Date   AAA (abdominal aortic aneurysm) 08/2014   scanning every 2 years   Arthritis    oa   Complication of anesthesia    slow to awaken in past   Family history of anesthesia complication    slow to awaken   GERD  (gastroesophageal reflux disease)    Grade II diastolic dysfunction 04/2017   Noted on ECHO   H/O hiatal hernia    Headache(784.0)    migraines (rare)   Hemorrhoids    Hyperlipidemia    Hypertension 04/01/2011   ECHO-EF>55% NUC STRESS TEST- 05/01/12   Hypothyroidism    Mild aortic valve regurgitation 04/2017   Noted on ECHO   Mitral valve regurgitation 04/2017   Mild: Noted on ECHO   PONV (postoperative nausea and vomiting)    Pulmonary nodules 2018   scanning every 6 months   Sleep apnea 05/01/12 & 05/29/12   SLEEP STUDY-[place] [facility], NO CPAP USED SINCE MAY 2015 Uses oral device    Family History  Problem Relation Age of Onset   Breast cancer Mother    Ovarian cancer Mother    Rheum arthritis Mother    Heart attack Father    Heart disease Father    Hypertension Brother    Heart disease Brother    Hyperlipidemia Maternal Grandmother    Hypertension Maternal Grandmother    Polymyalgia rheumatica Paternal Aunt    Hypertension Son    Migraines Daughter    Raynaud syndrome Daughter    Past Surgical History:  Procedure Laterality Date   ANKLE SURGERY Right 1980   CARPOMETACARPEL SUSPENSION PLASTY Right 12/14/2014   Procedure: RIGHT THUMB CARPOMETACARPAL (CMC) ARTHROPLASTY;  Surgeon: Kay CHRISTELLA Cummins, MD;  Location: [facility];  Service: Orthopedics;  Laterality: Right;   CARPOMETACARPEL SUSPENSION PLASTY Left 03/19/2017   Procedure: Left Thumb Ligament Reconstruction and Tendon Interposition;  Surgeon: Cummins Kay CHRISTELLA,  MD;  Location: [facility];  Service: Orthopedics;  Laterality: Left;   COLONOSCOPY WITH PROPOFOL  N/A 12/07/2013   Procedure: COLONOSCOPY WITH PROPOFOL ;  Surgeon: Gladis MARLA Louder, MD;  Location: [facility];  Service: Endoscopy;  Laterality: N/A;   CYSTECTOMY  1975   FOOT SURGERY Right 12/01/2012   FOOT SURGERY Left 12/10/2011   HEMORRHOID SURGERY  2005   HERNIA REPAIR  2019   paraesophageal hernia   Miscarriage  1983    TONSILLECTOMY  1964   TOTAL KNEE ARTHROPLASTY Right 03/22/2024   Procedure: ARTHROPLASTY, KNEE, TOTAL;  Surgeon: Jerri Kay HERO, MD;  Location: [facility];  Service: Orthopedics;  Laterality: Right;   VAGINAL HYSTERECTOMY     With pelvic floor repair   Social History[1] Social History   Social History Narrative   Not on file     Immunization History  Administered Date(s) Administered   Influenza Whole 11/09/2016   Influenza,inj,quad, With Preservative 12/15/2013   Influenza-Unspecified 11/10/2014, 11/09/2017   Moderna Sars-Covid-2 Vaccination 04/06/2019, 05/04/2019, 01/22/2020, 03/25/2021   Zoster Recombinant(Shingrix) 05/12/2018     Objective: Vital Signs: There were no vitals taken for this visit.   Physical Exam   Musculoskeletal Exam: ***  CDAI Exam: CDAI Score: -- Patient Global: --; Provider Global: -- Swollen: --; Tender: -- Joint Exam 04/07/2024   No joint exam has been documented for this visit   There is currently no information documented on the homunculus. Go to the Rheumatology activity and complete the homunculus joint exam.  Investigation: No additional findings.  Imaging: DG Knee Right Port Result Date: 03/22/2024 EXAM: 1 or 2 VIEW(S) XRAY OF THE KNEE 03/22/2024 02:21:00 PM COMPARISON: None available. CLINICAL HISTORY: Pain FINDINGS: BONES AND JOINTS: Total knee arthroplasty noted. Normal alignment. No acute fracture. No dislocation. Expected joint effusion. SOFT TISSUES: Expected soft tissue gas. IMPRESSION: 1. Total knee arthroplasty with normal alignment. 2. No unexpected fracture or dislocation. 3. Joint effusion and soft tissue gas, expected postoperatively. Electronically signed by: Dorethia Molt MD MD 03/22/2024 11:55 PM EST RP Workstation: [id number]    Recent Labs: Lab Results  Component Value Date   WBC 8.0 03/16/2024   HGB 14.2 03/16/2024   PLT 280 03/16/2024   NA 134 (L) 03/16/2024   K 4.7 03/16/2024   CL 100 03/16/2024   CO2 26  03/16/2024   GLUCOSE 88 03/16/2024   BUN 16 03/16/2024   CREATININE 0.82 03/16/2024   BILITOT 0.5 08/02/2021   ALKPHOS 71 11/23/2018   AST 26 08/02/2021   ALT 71 (H) 08/02/2021   PROT 6.7 08/02/2021   ALBUMIN 4.0 11/23/2018   CALCIUM  9.6 03/16/2024   GFRAA 74 11/23/2018    Speciality Comments: No specialty comments available.  Procedures:  No procedures performed Allergies: Midol [aspirin-cinnamedrine-caffeine ], Olmesartan , Talwin [pentazocine], Bisoprolol, and Naproxen   Assessment / Plan:     Visit Diagnoses: No diagnosis found.  Orders: No orders of the defined types were placed in this encounter.  No orders of the defined types were placed in this encounter.   Face-to-face time spent with patient was *** minutes. Greater than 50% of time was spent in counseling and coordination of care.  Follow-Up Instructions: No follow-ups on file.   Daved JAYSON Gavel, CMA  Note - This record has been created using Animal nutritionist.  Chart creation errors have been sought, but may not always  have been located. Such creation errors do not reflect on  the standard of medical care.    [1]  Social  History Tobacco Use   Smoking status: Never    Passive exposure: Never   Smokeless tobacco: Never  Vaping Use   Vaping status: Never Used  Substance Use Topics   Alcohol use: No    Alcohol/week: 0.0 standard drinks of alcohol   Drug use: No   "

## 2024-03-25 ENCOUNTER — Other Ambulatory Visit: Payer: Self-pay | Admitting: Physician Assistant

## 2024-03-25 ENCOUNTER — Telehealth: Payer: Self-pay | Admitting: *Deleted

## 2024-03-25 MED ORDER — OXYCODONE-ACETAMINOPHEN 5-325 MG PO TABS: 1.0000 | ORAL_TABLET | ORAL | 0 refills | Status: DC | PRN

## 2024-03-25 NOTE — Telephone Encounter (Signed)
 She called this morning and states the Oxycodone  helps, but really is taking 1 every 3.5 hours along with 1/2 of the Robaxin , b/c feels that is causing some urinary frequency with the muscle relaxer effect. She is timid about taking 2 at a time and waiting the full 8 hours. She states pain is continually an 8-9 with sharp shooting pains as well. Doesn't react well with Gabapentin  either. Gave her a pep talk, but definitely needs a refill of pain medications prior to the weekend. Thanks.

## 2024-03-25 NOTE — Telephone Encounter (Signed)
 Sounds good.  I sent percocet 1 every 4 hours prn

## 2024-03-29 ENCOUNTER — Telehealth: Payer: Self-pay | Admitting: *Deleted

## 2024-03-29 MED ORDER — CARISOPRODOL 250 MG PO TABS: 125.0000 mg | ORAL_TABLET | Freq: Every evening | ORAL | 0 refills | Status: AC | PRN

## 2024-03-29 NOTE — Telephone Encounter (Signed)
 Makes me a little nervous to prescribe this given her age and the amount of narcotics she is taking.

## 2024-03-29 NOTE — Telephone Encounter (Signed)
 Fine with me.  I can send in soma .

## 2024-03-29 NOTE — Telephone Encounter (Signed)
 Patient called today and states she is taking pain medication as prescribed, but is really making herself hold out for the 4 hours. She stopped with her muscle relaxer b/c she was voiding so frequently during the night, but she found a Soma  from a previous Rx and took 1/2 and got some sleep with no voiding issues during that time. She asked if you'd be willing to consider this muscle relaxant instead of our regulars? 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 Thank you.

## 2024-03-30 ENCOUNTER — Other Ambulatory Visit: Payer: Self-pay | Admitting: Cardiology

## 2024-03-30 MED ORDER — VALSARTAN 160 MG PO TABS: 80.0000 mg | ORAL_TABLET | Freq: Two times a day (BID) | ORAL | 2 refills | Status: AC

## 2024-03-30 NOTE — Telephone Encounter (Signed)
 Made patient aware that this has been sent in by MD.

## 2024-03-31 ENCOUNTER — Telehealth: Payer: Self-pay | Admitting: *Deleted

## 2024-03-31 ENCOUNTER — Other Ambulatory Visit: Payer: Self-pay | Admitting: Physician Assistant

## 2024-03-31 MED ORDER — CARISOPRODOL 350 MG PO TABS: 175.0000 mg | ORAL_TABLET | Freq: Every day | ORAL | 0 refills | Status: DC

## 2024-03-31 MED ORDER — OXYCODONE-ACETAMINOPHEN 5-325 MG PO TABS: 1.0000 | ORAL_TABLET | ORAL | 0 refills | Status: DC | PRN

## 2024-03-31 NOTE — Telephone Encounter (Signed)
 done

## 2024-03-31 NOTE — Telephone Encounter (Signed)
 Patient called back and said she checked with her pharmacy and several others and they do not carry a 250 mg tablet of Soma . The 350 mg tablet is standard and what most pharmacies carry. She can take 1/2 of that if you're agreeable. Could we re-order to that dose she asked? Thank you.

## 2024-03-31 NOTE — Telephone Encounter (Signed)
 sent

## 2024-03-31 NOTE — Telephone Encounter (Signed)
 Patient aware. Left VM.

## 2024-03-31 NOTE — Telephone Encounter (Signed)
 Call from patient requesting refill of pain medication. Thank you.

## 2024-04-02 ENCOUNTER — Other Ambulatory Visit: Payer: Self-pay | Admitting: Physician Assistant

## 2024-04-02 ENCOUNTER — Telehealth: Payer: Self-pay | Admitting: *Deleted

## 2024-04-02 MED ORDER — OXYCODONE-ACETAMINOPHEN 5-325 MG PO TABS: 1.0000 | ORAL_TABLET | ORAL | 0 refills | Status: AC | PRN

## 2024-04-02 NOTE — Telephone Encounter (Signed)
 I sent in one more for one tab every 4 hours prn, but will then need to spread out farther after that

## 2024-04-02 NOTE — Telephone Encounter (Signed)
 Patient aware

## 2024-04-02 NOTE — Telephone Encounter (Signed)
 Patient called and asked about refill of pain medication over the weekend. Asked if refill could be sent before weather. Thanks.

## 2024-04-06 ENCOUNTER — Encounter: Admitting: Orthopaedic Surgery

## 2024-04-07 ENCOUNTER — Ambulatory Visit: Admitting: Rheumatology

## 2024-04-07 ENCOUNTER — Ambulatory Visit: Admitting: Physician Assistant

## 2024-04-07 ENCOUNTER — Other Ambulatory Visit: Payer: Self-pay | Admitting: Orthopaedic Surgery

## 2024-04-07 ENCOUNTER — Telehealth: Payer: Self-pay | Admitting: Orthopaedic Surgery

## 2024-04-07 DIAGNOSIS — G4733 Obstructive sleep apnea (adult) (pediatric): Secondary | ICD-10-CM

## 2024-04-07 DIAGNOSIS — K449 Diaphragmatic hernia without obstruction or gangrene: Secondary | ICD-10-CM

## 2024-04-07 DIAGNOSIS — N390 Urinary tract infection, site not specified: Secondary | ICD-10-CM

## 2024-04-07 DIAGNOSIS — R7689 Other specified abnormal immunological findings in serum: Secondary | ICD-10-CM

## 2024-04-07 DIAGNOSIS — I10 Essential (primary) hypertension: Secondary | ICD-10-CM

## 2024-04-07 DIAGNOSIS — M1812 Unilateral primary osteoarthritis of first carpometacarpal joint, left hand: Secondary | ICD-10-CM

## 2024-04-07 DIAGNOSIS — Z8269 Family history of other diseases of the musculoskeletal system and connective tissue: Secondary | ICD-10-CM

## 2024-04-07 DIAGNOSIS — Z8249 Family history of ischemic heart disease and other diseases of the circulatory system: Secondary | ICD-10-CM

## 2024-04-07 DIAGNOSIS — Z8639 Personal history of other endocrine, nutritional and metabolic disease: Secondary | ICD-10-CM

## 2024-04-07 DIAGNOSIS — M7061 Trochanteric bursitis, right hip: Secondary | ICD-10-CM

## 2024-04-07 DIAGNOSIS — Z96651 Presence of right artificial knee joint: Secondary | ICD-10-CM

## 2024-04-07 DIAGNOSIS — R0602 Shortness of breath: Secondary | ICD-10-CM

## 2024-04-07 DIAGNOSIS — M5030 Other cervical disc degeneration, unspecified cervical region: Secondary | ICD-10-CM

## 2024-04-07 DIAGNOSIS — M170 Bilateral primary osteoarthritis of knee: Secondary | ICD-10-CM

## 2024-04-07 DIAGNOSIS — R7989 Other specified abnormal findings of blood chemistry: Secondary | ICD-10-CM

## 2024-04-07 DIAGNOSIS — Z8261 Family history of arthritis: Secondary | ICD-10-CM

## 2024-04-07 DIAGNOSIS — Z8669 Personal history of other diseases of the nervous system and sense organs: Secondary | ICD-10-CM

## 2024-04-07 MED ORDER — CARISOPRODOL 350 MG PO TABS: 175.0000 mg | ORAL_TABLET | Freq: Every day | ORAL | 0 refills | Status: AC

## 2024-04-07 MED ORDER — CELECOXIB 100 MG PO CAPS: 100.0000 mg | ORAL_CAPSULE | Freq: Two times a day (BID) | ORAL | 1 refills | Status: AC | PRN

## 2024-04-07 MED ORDER — CELECOXIB 200 MG PO CAPS: 200.0000 mg | ORAL_CAPSULE | Freq: Two times a day (BID) | ORAL | 2 refills | Status: DC | PRN

## 2024-04-07 MED ORDER — OXYCODONE-ACETAMINOPHEN 7.5-325 MG PO TABS: 1.0000 | ORAL_TABLET | ORAL | 0 refills | Status: AC | PRN

## 2024-04-07 NOTE — Progress Notes (Signed)
 Jenny Bradford requested refill of soma .  She is well aware of possible risks of taking soma  at her age.

## 2024-04-07 NOTE — Telephone Encounter (Signed)
 Please advise on message below.  Needs clarification on which Rx to fill.

## 2024-04-07 NOTE — Telephone Encounter (Signed)
 Amy (Pharmasist called needed clarification for Soma . Please call Amy at [phone].

## 2024-04-07 NOTE — Telephone Encounter (Signed)
 Thank you :)

## 2024-04-07 NOTE — Telephone Encounter (Signed)
 I filled the soma 

## 2024-04-07 NOTE — Progress Notes (Signed)
 "  Post-Op Visit Note   Patient: Jenny Bradford           Date of Birth: 09-May-1950           MRN: [id number] Visit Date: 04/07/2024 PCP: Gerome Brunet, DO   Assessment & Plan:  Chief Complaint: No chief complaint on file.  Visit Diagnoses:  1. Status post total right knee replacement     Plan: Patient is a pleasant 74 year old female who comes in today 2 weeks status post right total knee replacement.  She has been struggling with pain since surgery.  She has been taking Percocet and Soma .  She has been compliant taking Eliquis  twice daily for DVT prophylaxis but notes she has had what she thinks is internal hemorrhoid bleeding.  She has also noticed itching around the incision and started taking Zyrtec 2 days ago.  She is getting home health physical therapy and is ambulating with a walker.  Examination of the right knee reveals a well-healed surgical incision with nylon sutures in place.  She does have significant erythema around the incision just under where the bandage was applied.  No signs of infection or cellulitis.  Calves are soft nontender.  She is neurovascular tact distally.  Today, stitches were removed.  We have held off on applying Steri-Strips due to the bandage irritation.  Ace wrap was applied.  She will continue taking Zyrtec and apply hydrocortisone  around the irritation but avoid anywhere near the incision.  I refilled her Percocet and have agreed to increase her dose for a week.  Due to the bleeding, the patient and Dr. Jerri spoke about discontinuing the Eliquis  and taking one half 325 mg aspirin twice daily for the next 4 weeks to prevent blood clots.  They have also discussed taking Celebrex  for pain relief.  Dr. Jerri will refill the Soma  as I do not feel comfortable sending this in.  In regards to the Celebrex , I have discouraged taking this with aspirin, but the patient is a pharmacist and has agreed to take on the risk of possible GI bleed.  She will take a PPI with this.   She is scheduled to start outpatient physical therapy today.  She will follow-up with Dr. Jerri in 4 weeks for repeat evaluation and 2 view x-rays of the right knee.  Call with concerns or questions.  Follow-Up Instructions: Return in about 4 weeks (around 05/05/2024).   Orders:  No orders of the defined types were placed in this encounter.  Meds ordered this encounter  Medications   DISCONTD: celecoxib  (CELEBREX ) 200 MG capsule    Sig: Take 1 capsule (200 mg total) by mouth 2 (two) times daily as needed.    Dispense:  60 capsule    Refill:  2   oxyCODONE -acetaminophen  (PERCOCET) 7.5-325 MG tablet    Sig: Take 1 tablet by mouth every 4 (four) hours as needed for severe pain (pain score 7-10).    Dispense:  30 tablet    Refill:  0   celecoxib  (CELEBREX ) 100 MG capsule    Sig: Take 1 capsule (100 mg total) by mouth 2 (two) times daily as needed.    Dispense:  60 capsule    Refill:  1    Imaging: No new imaging  PMFS History: Patient Active Problem List   Diagnosis Date Noted   Status post total right knee replacement 03/22/2024   Arthritis of right midfoot 02/27/2023   Bilateral hip pain 01/02/2021   Primary osteoarthritis of  left knee 01/26/2019   Primary osteoarthritis of right knee 01/26/2019   Palpitations 09/04/2017   Family history of coronary artery disease in father 09/04/2017   Paraesophageal hernia 05/13/2017   Neck pain 01/03/2017   Large hiatal hernia 12/28/2016   Multiple pulmonary nodules determined by computed tomography of lung 12/27/2016   Arthritis of carpometacarpal [facility]) joint of left thumb 05/28/2016   OSA (obstructive sleep apnea) 10/05/2012   HTN (hypertension) 10/05/2012   Hyperlipidemia 10/05/2012   Migraine headache 10/05/2012   Past Medical History:  Diagnosis Date   AAA (abdominal aortic aneurysm) 08/2014   scanning every 2 years   Arthritis    oa   Complication of anesthesia    slow to awaken in past   Family history of anesthesia  complication    slow to awaken   GERD (gastroesophageal reflux disease)    Grade II diastolic dysfunction 04/2017   Noted on ECHO   H/O hiatal hernia    Headache(784.0)    migraines (rare)   Hemorrhoids    Hyperlipidemia    Hypertension 04/01/2011   ECHO-EF>55% NUC STRESS TEST- 05/01/12   Hypothyroidism    Mild aortic valve regurgitation 04/2017   Noted on ECHO   Mitral valve regurgitation 04/2017   Mild: Noted on ECHO   PONV (postoperative nausea and vomiting)    Pulmonary nodules 2018   scanning every 6 months   Sleep apnea 05/01/12 & 05/29/12   SLEEP STUDY-[facility], NO CPAP USED SINCE MAY 2015 Uses oral device    Family History  Problem Relation Age of Onset   Breast cancer Mother    Ovarian cancer Mother    Rheum arthritis Mother    Heart attack Father    Heart disease Father    Hypertension Brother    Heart disease Brother    Hyperlipidemia Maternal Grandmother    Hypertension Maternal Grandmother    Polymyalgia rheumatica Paternal Aunt    Hypertension Son    Migraines Daughter    Raynaud syndrome Daughter     Past Surgical History:  Procedure Laterality Date   ANKLE SURGERY Right 1980   CARPOMETACARPEL SUSPENSION PLASTY Right 12/14/2014   Procedure: RIGHT THUMB CARPOMETACARPAL (CMC) ARTHROPLASTY;  Surgeon: Kay CHRISTELLA Cummins, MD;  Location: [facility];  Service: Orthopedics;  Laterality: Right;   CARPOMETACARPEL SUSPENSION PLASTY Left 03/19/2017   Procedure: Left Thumb Ligament Reconstruction and Tendon Interposition;  Surgeon: Cummins Kay CHRISTELLA, MD;  Location: [facility];  Service: Orthopedics;  Laterality: Left;   COLONOSCOPY WITH PROPOFOL  N/A 12/07/2013   Procedure: COLONOSCOPY WITH PROPOFOL ;  Surgeon: Gladis MARLA Louder, MD;  Location: [facility];  Service: Endoscopy;  Laterality: N/A;   CYSTECTOMY  1975   FOOT SURGERY Right 12/01/2012   FOOT SURGERY Left 12/10/2011   HEMORRHOID SURGERY  2005   HERNIA REPAIR  2019    paraesophageal hernia   Miscarriage  1983   TONSILLECTOMY  1964   TOTAL KNEE ARTHROPLASTY Right 03/22/2024   Procedure: ARTHROPLASTY, KNEE, TOTAL;  Surgeon: Cummins Kay CHRISTELLA, MD;  Location: [facility];  Service: Orthopedics;  Laterality: Right;   VAGINAL HYSTERECTOMY     With pelvic floor repair   Social History   Occupational History   Not on file  Tobacco Use   Smoking status: Never    Passive exposure: Never   Smokeless tobacco: Never  Vaping Use   Vaping status: Never Used  Substance and Sexual Activity   Alcohol use: No  Alcohol/week: 0.0 standard drinks of alcohol   Drug use: No   Sexual activity: Not on file     "

## 2024-04-08 ENCOUNTER — Telehealth: Payer: Self-pay | Admitting: *Deleted

## 2024-04-08 NOTE — Telephone Encounter (Signed)
 I looked in the medication profile and I do not see the 2 Celebrex  prescriptions.  I only see the 100 mg prescription that [facility] sent.  In regards to the Soma , 1 tablet total in a day is the most I feel comfortable prescribing.

## 2024-04-08 NOTE — Telephone Encounter (Signed)
 After I had already sent the 200 mg celebrex  in, she changed her mind and wanted the 100 mg caps so I cancelled the 200 mg from my end and sent the 100 mg caps.  May need to call the pharmacy also?

## 2024-04-08 NOTE — Telephone Encounter (Signed)
 Patient called this morning and states there are 2 prescriptions for the Celebrex  at the pharmacy and couldn't pick up because the 200 mg wasn't canceled. I don't know if that would just take a phone call to them or what. Also, the Soma , she said she is taking more frequently than the 0.5 tablet before bed. She is taking 1 tablet bid and wanted to know if she could get a few extra to get her through therapy since she is taking more than prescribed. Thank you.

## 2024-04-13 ENCOUNTER — Telehealth: Payer: Self-pay | Admitting: *Deleted

## 2024-04-13 ENCOUNTER — Other Ambulatory Visit: Payer: Self-pay | Admitting: Physician Assistant

## 2024-04-13 MED ORDER — OXYCODONE-ACETAMINOPHEN 5-325 MG PO TABS: 1.0000 | ORAL_TABLET | Freq: Four times a day (QID) | ORAL | 0 refills | Status: AC | PRN

## 2024-04-13 NOTE — Telephone Encounter (Signed)
 Sounds good. Thanks for the update!

## 2024-04-13 NOTE — Telephone Encounter (Signed)
 Spoke with patient this morning. She does need refill of pain medication Percocet 5/325mg . She also states that she has only taken a few of the Celebrex  after last week's conversation and hasn't taken in several days. She was taking the ASA bid as discussed, but has had some increase in bright red bleeding after bowel movements over the weekend. Not dark or tarry. She has had a decent amount of nausea and did start some Nexium x past 4 days as well. She cut back the ASA to 1 per day, but hasn't had any today. She wanted some direction on this along with her refill of pain medication. Thank you.

## 2024-04-13 NOTE — Telephone Encounter (Signed)
 I would reach out to pcp about the bleeding and see if they suggest she stop the aspirin altogether.  If she stops the aspirin, make sure she is wearing the compression socks, etc.    I sent in a rx for oxy, but it will be every 6 hours prn and it is the oxy 5-325.

## 2024-04-14 NOTE — Telephone Encounter (Signed)
 thanks

## 2024-05-05 ENCOUNTER — Encounter: Admitting: Orthopaedic Surgery

## 2024-08-03 ENCOUNTER — Ambulatory Visit: Admitting: Rheumatology
